# Patient Record
Sex: Female | Born: 1950
Health system: Southern US, Community
[De-identification: ages and names within clinical notes are randomized; demographics above are authoritative.]

## PROBLEM LIST (undated history)

## (undated) DIAGNOSIS — F419 Anxiety disorder, unspecified: Secondary | ICD-10-CM

## (undated) DIAGNOSIS — F32A Depression, unspecified: Secondary | ICD-10-CM

## (undated) DIAGNOSIS — K449 Diaphragmatic hernia without obstruction or gangrene: Secondary | ICD-10-CM

## (undated) DIAGNOSIS — K219 Gastro-esophageal reflux disease without esophagitis: Secondary | ICD-10-CM

## (undated) DIAGNOSIS — T7840XA Allergy, unspecified, initial encounter: Secondary | ICD-10-CM

## (undated) DIAGNOSIS — M1990 Unspecified osteoarthritis, unspecified site: Secondary | ICD-10-CM

## (undated) DIAGNOSIS — F329 Major depressive disorder, single episode, unspecified: Secondary | ICD-10-CM

## (undated) HISTORY — DX: Unspecified osteoarthritis, unspecified site: M19.90

## (undated) HISTORY — DX: Allergy, unspecified, initial encounter: T78.40XA

## (undated) HISTORY — PX: WRIST SURGERY: SHX841

## (undated) HISTORY — DX: Major depressive disorder, single episode, unspecified: F32.9

## (undated) HISTORY — DX: Depression, unspecified: F32.A

## (undated) HISTORY — PX: TONSILLECTOMY: SUR1361

## (undated) HISTORY — DX: Diaphragmatic hernia without obstruction or gangrene: K44.9

---

## 2010-11-18 ENCOUNTER — Ambulatory Visit: Payer: Self-pay | Admitting: Gastroenterology

## 2011-06-28 ENCOUNTER — Emergency Department: Payer: Self-pay | Admitting: *Deleted

## 2011-06-28 LAB — URINALYSIS, COMPLETE
Bacteria: NONE SEEN
Bilirubin,UR: NEGATIVE
Blood: NEGATIVE
Glucose,UR: NEGATIVE mg/dL (ref 0–75)
Leukocyte Esterase: NEGATIVE
Nitrite: NEGATIVE
Ph: 7 (ref 4.5–8.0)
Protein: NEGATIVE
Specific Gravity: 1.005 (ref 1.003–1.030)
Squamous Epithelial: 1
WBC UR: <1 /HPF (ref 0–5)

## 2011-06-28 LAB — CBC WITH DIFFERENTIAL/PLATELET
Eosinophil #: 0 10*3/uL (ref 0.0–0.7)
HCT: 39.9 % (ref 35.0–47.0)
MCH: 29.5 pg (ref 26.0–34.0)
Monocyte #: 0.7 x10 3/mm (ref 0.2–0.9)
Neutrophil #: 7.6 10*3/uL — ABNORMAL HIGH (ref 1.4–6.5)
Platelet: 350 10*3/uL (ref 150–440)
RDW: 13.3 % (ref 11.5–14.5)

## 2011-06-28 LAB — BASIC METABOLIC PANEL
Anion Gap: 9 (ref 7–16)
Calcium, Total: 9.6 mg/dL (ref 8.5–10.1)
Co2: 26 mmol/L (ref 21–32)
Creatinine: 1 mg/dL (ref 0.60–1.30)
EGFR (Non-African Amer.): 60 — ABNORMAL LOW
Sodium: 141 mmol/L (ref 136–145)

## 2012-10-11 ENCOUNTER — Emergency Department: Payer: Self-pay | Admitting: Emergency Medicine

## 2012-10-11 LAB — COMPREHENSIVE METABOLIC PANEL
Albumin: 3.9 g/dL (ref 3.4–5.0)
Alkaline Phosphatase: 167 U/L — ABNORMAL HIGH (ref 50–136)
Anion Gap: 9 (ref 7–16)
BUN: 12 mg/dL (ref 7–18)
Bilirubin,Total: 0.8 mg/dL (ref 0.2–1.0)
Calcium, Total: 9.7 mg/dL (ref 8.5–10.1)
Co2: 26 mmol/L (ref 21–32)
EGFR (Non-African Amer.): >60
Glucose: 105 mg/dL — ABNORMAL HIGH (ref 65–99)
Osmolality: 283 (ref 275–301)
Potassium: 3.5 mmol/L (ref 3.5–5.1)
SGOT(AST): 22 U/L (ref 15–37)
SGPT (ALT): 17 U/L (ref 12–78)
Sodium: 142 mmol/L (ref 136–145)
Total Protein: 7.6 g/dL (ref 6.4–8.2)

## 2012-10-11 LAB — CBC
HCT: 41.7 % (ref 35.0–47.0)
MCH: 31.1 pg (ref 26.0–34.0)
MCHC: 35.7 g/dL (ref 32.0–36.0)
Platelet: 249 10*3/uL (ref 150–440)
RBC: 4.78 10*6/uL (ref 3.80–5.20)
RDW: 13.6 % (ref 11.5–14.5)
WBC: 10.3 10*3/uL (ref 3.6–11.0)

## 2014-08-20 ENCOUNTER — Other Ambulatory Visit: Payer: Self-pay | Admitting: Internal Medicine

## 2014-08-20 DIAGNOSIS — Z1231 Encounter for screening mammogram for malignant neoplasm of breast: Secondary | ICD-10-CM

## 2014-08-27 ENCOUNTER — Ambulatory Visit: Payer: Self-pay | Attending: Internal Medicine

## 2014-08-30 ENCOUNTER — Encounter: Payer: Self-pay | Admitting: Emergency Medicine

## 2014-08-30 ENCOUNTER — Emergency Department: Payer: Medicare HMO

## 2014-08-30 ENCOUNTER — Other Ambulatory Visit: Payer: Self-pay

## 2014-08-30 ENCOUNTER — Emergency Department
Admission: EM | Admit: 2014-08-30 | Discharge: 2014-08-30 | Disposition: A | Discharge status: Home / Self Care | Payer: Medicare HMO | Attending: Emergency Medicine | Admitting: Emergency Medicine

## 2014-08-30 DIAGNOSIS — K222 Esophageal obstruction: Secondary | ICD-10-CM

## 2014-08-30 DIAGNOSIS — T18128A Food in esophagus causing other injury, initial encounter: Secondary | ICD-10-CM | POA: Diagnosis present

## 2014-08-30 DIAGNOSIS — Y9389 Activity, other specified: Secondary | ICD-10-CM | POA: Diagnosis not present

## 2014-08-30 DIAGNOSIS — Y9289 Other specified places as the place of occurrence of the external cause: Secondary | ICD-10-CM | POA: Diagnosis not present

## 2014-08-30 DIAGNOSIS — X58XXXA Exposure to other specified factors, initial encounter: Secondary | ICD-10-CM | POA: Insufficient documentation

## 2014-08-30 DIAGNOSIS — Y998 Other external cause status: Secondary | ICD-10-CM | POA: Diagnosis not present

## 2014-08-30 DIAGNOSIS — W44F3XA Food entering into or through a natural orifice, initial encounter: Secondary | ICD-10-CM

## 2014-08-30 HISTORY — DX: Anxiety disorder, unspecified: F41.9

## 2014-08-30 HISTORY — DX: Gastro-esophageal reflux disease without esophagitis: K21.9

## 2014-08-30 NOTE — ED Provider Notes (Signed)
[facility] Emergency Department Provider Note  ____________________________________________  Time seen: 6:05 PM  I have reviewed the triage vital signs and the nursing notes.   HISTORY  Chief Complaint Airway Obstruction    HPI Katrina Sutton is a 64 y.o. female who reports approximately 2 hours ago she was eating food and it felt like it got stuck in her esophagus near the epigastrium. She does have a history of esophageal stricture requiring dilation as well as hiatal hernia. He is been told by Dr. Shelle Iron that she needs to be dilated again, but has not done so yet. No fever or chills, no recent procedure or instrumentation. No recent surgery. No abdominal pain or distention. Normal oral intake without nausea vomiting or diarrhea or blood in stool or melena.Denies chest pain or shortness of breath.  The patient initially tried to drink Sprite but was having epigastric pain. She now reports that since being in the waiting room her symptoms have resolved and it feels like the food is passed. The patient also notes that she's been recommended to take Prilosec daily but has only been taking Zantac.     Past Medical History  Diagnosis Date  . Anxiety   . GERD (gastroesophageal reflux disease)     There are no active problems to display for this patient.   Past Surgical History  Procedure Laterality Date  . Tonsillectomy      No current outpatient prescriptions on file.  Allergies Review of patient's allergies indicates no known allergies.  No family history on file.  Social History History  Substance Use Topics  . Smoking status: Never Smoker   . Smokeless tobacco: Not on file  . Alcohol Use: No    Review of Systems  Constitutional: No fever or chills. No weight changes Eyes:No blurry vision or double vision.  ENT: No sore throat. Cardiovascular: No chest pain. Respiratory: No dyspnea or cough. Gastrointestinal: Epigastric pain as above, no  vomiting or diarrhea..  No BRBPR or melena. Genitourinary: Negative for dysuria, urinary retention, bloody urine, or difficulty urinating. Musculoskeletal: Negative for back pain. No joint swelling or pain. Skin: Negative for rash. Neurological: Negative for headaches, focal weakness or numbness. Psychiatric:No anxiety or depression.   Endocrine:No hot/cold intolerance, changes in energy, or sleep difficulty.  10-point ROS otherwise negative.  ____________________________________________   PHYSICAL EXAM:  VITAL SIGNS: ED Triage Vitals  Enc Vitals Group     BP 08/30/14 1713 142/86 mmHg     Pulse Rate 08/30/14 1713 72     Resp 08/30/14 1713 20     Temp 08/30/14 1713 97.7 F (36.5 C)     Temp Source 08/30/14 1713 Oral     SpO2 08/30/14 1713 95 %     Weight 08/30/14 1713 155 lb (70.308 kg)     Height 08/30/14 1713 5\' 5"  (1.651 m)     Head Cir --      Peak Flow --      Pain Score 08/30/14 1719 5     Pain Loc --      Pain Edu? --      Excl. in GC? --      Constitutional: Alert and oriented. Well appearing and in no distress. Eyes: No scleral icterus. No conjunctival pallor. PERRL. EOMI ENT   Head: Normocephalic and atraumatic.   Nose: No congestion/rhinnorhea. No septal hematoma   Mouth/Throat: MMM, no pharyngeal erythema. No peritonsillar mass. No uvula shift.   Neck: No stridor. No SubQ emphysema.  No meningismus. Hematological/Lymphatic/Immunilogical: No cervical lymphadenopathy. Cardiovascular: RRR. Normal and symmetric distal pulses are present in all extremities. No murmurs, rubs, or gallops. Respiratory: Normal respiratory effort without tachypnea nor retractions. Breath sounds are clear and equal bilaterally. No wheezes/rales/rhonchi. Gastrointestinal: Soft and nontender. No distention. There is no CVA tenderness.  No rebound, rigidity, or guarding. Genitourinary: deferred Musculoskeletal: Nontender with normal range of motion in all extremities. No  joint effusions.  No lower extremity tenderness.  No edema. Neurologic:   Normal speech and language.  CN 2-10 normal. Motor grossly intact. No pronator drift.  Normal gait. No gross focal neurologic deficits are appreciated.  Skin:  Skin is warm, dry and intact. No rash noted.  No petechiae, purpura, or bullae. Psychiatric: Mood and affect are normal. Speech and behavior are normal. Patient exhibits appropriate insight and judgment.  ____________________________________________    LABS (pertinent positives/negatives) (all labs ordered are listed, but only abnormal results are displayed) Labs Reviewed - No data to display ____________________________________________   EKG   Date: 08/30/2014  Rate: D3  Rhythm: normal sinus rhythm  QRS Axis: normal  Intervals: normal  ST/T Wave abnormalities: normal  Conduction Disutrbances: none  Narrative Interpretation: unremarkable  EKG interpreted by me    ____________________________________________    RADIOLOGY  Chest x-ray interpreted by me, radiologist report reviewed X-ray shows normal and her spaces and cardiac mediastinal silhouette. There is a large right-sided hiatal hernia and eventration of the diaphragm. There is no free air or pneumothorax.  ____________________________________________   PROCEDURES  ____________________________________________   INITIAL IMPRESSION / ASSESSMENT AND PLAN / ED COURSE  Pertinent labs & imaging results that were available during my care of the patient were reviewed by me and considered in my medical decision making (see chart for details).  Patient is well-appearing. She presented with complaints of an esophageal food bolus impaction which has now resolved. She is tolerating oral intake and drinking soda without difficulty or symptoms in the ED. I did advise her to take Prilosec as directed due to her hiatal hernia and follow-up with Dr. Shelle Iron regarding her esophageal stricture, as the  symptoms suggest that she needs to be evaluated for possibly having a repeat dilation done. She is medically stable and good condition and will discharge her home.  ____________________________________________   FINAL CLINICAL IMPRESSION(S) / ED DIAGNOSES  Final diagnoses:  Esophageal obstruction due to food impaction      Sharman Cheek, MD 08/30/14 [id number]

## 2014-08-30 NOTE — ED Notes (Signed)
Pt presents with bread stuck in her esophagus. Pt with hx of stricture and has had stretching performed in the past. No  Respiratory distress.

## 2014-08-30 NOTE — Discharge Instructions (Signed)
Esophageal Stricture °The esophagus is the long, narrow tube which carries food and liquid from the mouth to the stomach. Sometimes a part of the esophagus becomes narrow and makes it difficult, painful, or even impossible to swallow. This is called an esophageal stricture.  °CAUSES  °Common causes of blockage or strictures of the esophagus are: °· Exposure of the lower esophagus to the acid from the stomach may cause narrowing. °· Hiatal hernia in which a small part of the stomach bulges up through the diaphragm can cause a narrowing in the bottom of the esophagus. °· Scleroderma is a tissue disorder that affects the esophagus and makes swallowing difficult. °· Achalasia is an absence of nerves in the lower esophagus and to the esophageal sphincter. This absence of nerves may be congenital (present since birth). This can cause irregular spasms which do not allow food and fluid through. °· Strictures may develop from swallowing materials which damage the esophagus. Examples are acids or alkalis such as lye. °· Schatzki's Ring is a narrow ring of non-cancerous tissue which narrows the lower esophagus. The cause of this is unknown. °· Growths can block the esophagus. °SYMPTOMS  °Some of the problems are difficulty swallowing or pain with swallowing. °DIAGNOSIS  °Your caregiver often suspects this problem by taking a medical history. They will also do a physical exam. They may then take X-rays and/or perform an endoscopy. Endoscopy is an exam in which a tube like a small flexible telescope is used to look at your esophagus.  °TREATMENT °· One form of treatment is to dilate the narrow area. This means to stretch it. °· When this is not successful, chest surgery may be required. This is a much more extensive form of treatment with a longer recovery time. °Both of the above treatments make the passage of food and water into the stomach easier. They also make it easier for stomach contents to bubble back into the  esophagus. Special medications may be used following the procedure to help prevent further narrowing. Medications may be used to lower the amount of acid in the stomach juice.  °SEEK IMMEDIATE MEDICAL CARE IF:  °· Your swallowing is becoming more painful, difficult, or you are unable to swallow. °· You vomit up blood. °· You develop black tarry stools. °· You develop chills. °· You have a fever. °· You develop chest or abdominal pain. °· You develop shortness of breath, feel lightheaded, or faint. °Follow up with medical care as your caregiver suggests. °Document Released: 11/09/2005 Document Revised: 05/24/2011 Document Reviewed: 07/11/2013 °ExitCare® Patient Information ©2015 [employer]. This information is not intended to replace advice given to you by your health care provider. Make sure you discuss any questions you have with your health care provider. ° °

## 2015-10-16 ENCOUNTER — Ambulatory Visit: Payer: Medicare HMO | Admitting: Podiatry

## 2015-12-02 ENCOUNTER — Other Ambulatory Visit: Payer: Self-pay | Admitting: Internal Medicine

## 2015-12-02 DIAGNOSIS — K222 Esophageal obstruction: Secondary | ICD-10-CM

## 2015-12-03 ENCOUNTER — Ambulatory Visit: Payer: Medicare HMO

## 2016-02-11 DIAGNOSIS — M1711 Unilateral primary osteoarthritis, right knee: Secondary | ICD-10-CM | POA: Insufficient documentation

## 2016-12-27 ENCOUNTER — Encounter: Payer: Self-pay | Admitting: Nurse Practitioner

## 2016-12-27 ENCOUNTER — Ambulatory Visit (INDEPENDENT_AMBULATORY_CARE_PROVIDER_SITE_OTHER): Payer: Medicare HMO | Admitting: Nurse Practitioner

## 2016-12-27 VITALS — BP 121/73 | HR 70 | Temp 98.2°F | Ht 64.25 in | Wt 163.6 lb

## 2016-12-27 DIAGNOSIS — F329 Major depressive disorder, single episode, unspecified: Secondary | ICD-10-CM | POA: Insufficient documentation

## 2016-12-27 DIAGNOSIS — F419 Anxiety disorder, unspecified: Secondary | ICD-10-CM

## 2016-12-27 DIAGNOSIS — K219 Gastro-esophageal reflux disease without esophagitis: Secondary | ICD-10-CM | POA: Insufficient documentation

## 2016-12-27 DIAGNOSIS — F3289 Other specified depressive episodes: Secondary | ICD-10-CM

## 2016-12-27 DIAGNOSIS — K2100 Gastro-esophageal reflux disease with esophagitis, without bleeding: Secondary | ICD-10-CM

## 2016-12-27 DIAGNOSIS — F418 Other specified anxiety disorders: Secondary | ICD-10-CM | POA: Insufficient documentation

## 2016-12-27 DIAGNOSIS — M1990 Unspecified osteoarthritis, unspecified site: Secondary | ICD-10-CM | POA: Insufficient documentation

## 2016-12-27 DIAGNOSIS — Z7689 Persons encountering health services in other specified circumstances: Secondary | ICD-10-CM | POA: Diagnosis not present

## 2016-12-27 DIAGNOSIS — F411 Generalized anxiety disorder: Secondary | ICD-10-CM | POA: Insufficient documentation

## 2016-12-27 DIAGNOSIS — J302 Other seasonal allergic rhinitis: Secondary | ICD-10-CM | POA: Insufficient documentation

## 2016-12-27 NOTE — Progress Notes (Signed)
Subjective:    Patient ID: Katrina Sutton, female    DOB: 1950-10-11, 66 y.o.   MRN: [id number]  Katrina Sutton is a 66 y.o. female presenting on 12/27/2016 for Establish Care (pt PCP retired)   HPI [facility] Provider Pt last seen by PCP Dr. Brynda Greathouse within the last 6 months.  Is establishing care here because Dr. Brynda Greathouse is retired.  Records received and reviewed prior to appointment.  Chronic Sciatica Sees Dr. Harlow Ohms Chiropractor for back - nerves in lower back for sciatic nerve.  Has adjustments montly.  Has significant improvement w/ alignments.  Previously was having sciatica w/ right leg weakness. - Declines bone scan for osteoporosis in Dr. Ermalene Searing.  Osteoarthritis Has some arthritis of knees, hips, hands, and back.  Managable and takes acetaminophen as needed for pain management.  Anxiety and Depression - Currently takes Paxil 40 mg once daily despite Rx for 60 mg once daily.  Also taking Klonopin 1 mg 3 x per day.  Usually uses 3 per day, but had used 4 per day in past.  Has previously been instructed by Dr. Brynda Greathouse to increase Paxil to 60 mg per day so we can decrease Klonopin use, but pt didn't notice change w/ 60 mg and has reduced to 40 mg per day.  GERD and Esophageal Stricture Takes prilosec 40 mg once daily.  Also has esophagitis.  Has no breakthrough reflux when taking PPI.  Has been on stable dose for several years.   GAD 7 : Generalized Anxiety Score 12/27/2016  Nervous, Anxious, on Edge 1  Control/stop worrying 1  Worry too much - different things 1  Trouble relaxing 1  Restless 1  Easily annoyed or irritable 1  Afraid - awful might happen 0  Total GAD 7 Score 6  Anxiety Difficulty Somewhat difficult   Depression screen [facility] 2/9 12/27/2016 12/27/2016  Decreased Interest 0 0  Down, Depressed, Hopeless 0 0  PHQ - 2 Score 0 0  Altered sleeping 1 -  Tired, decreased energy 1 -  Change in appetite 0 -  Feeling bad or failure about yourself  1 -    Trouble concentrating 0 -  Moving slowly or fidgety/restless 1 -  Suicidal thoughts 0 -  PHQ-9 Score 4 -  Difficult doing work/chores Somewhat difficult -    Past Medical History:  Diagnosis Date  . Allergy    seasonal  . Anxiety   . Arthritis    osteoarthritis  . Depression   . GERD (gastroesophageal reflux disease)    Past Surgical History:  Procedure Laterality Date  . TONSILLECTOMY    . WRIST SURGERY Left    Social History   Social History  . Marital status: Married    Spouse name: N/A  . Number of children: N/A  . Years of education: N/A   Occupational History  . Not on file.   Social History Main Topics  . Smoking status: Former Smoker    Quit date: 12/27/1976  . Smokeless tobacco: Never Used  . Alcohol use No  . Drug use: No  . Sexual activity: No   Other Topics Concern  . Not on file   Social History Narrative  . No narrative on file   Family History  Problem Relation Age of Onset  . Mental illness Mother   . Ulcers Father   . Stroke Neg Hx   . Heart attack Neg Hx   . Cancer Neg Hx  No current outpatient prescriptions on file prior to visit.   No current facility-administered medications on file prior to visit.     Review of Systems  Constitutional: Negative.   HENT: Negative.   Eyes: Negative.   Respiratory: Negative.   Cardiovascular: Negative.   Gastrointestinal: Negative.   Endocrine: Negative.   Genitourinary: Negative.   Musculoskeletal: Positive for arthralgias and back pain.  Skin: Negative.   Allergic/Immunologic: Negative.   Neurological: Negative.   Hematological: Negative.   Psychiatric/Behavioral: Negative for agitation, behavioral problems, confusion, decreased concentration, dysphoric mood, hallucinations, self-injury, sleep disturbance and suicidal ideas. The patient is nervous/anxious. The patient is not hyperactive.    Per HPI unless specifically indicated above     Objective:    BP 121/73 (BP Location:  Left Arm, Patient Position: Sitting, Cuff Size: Normal)   Pulse 70   Temp 98.2 F (36.8 C) (Oral)   Ht 5' 4.25" (1.632 m)   Wt 163 lb 9.6 oz (74.2 kg)   BMI 27.86 kg/m   Wt Readings from Last 3 Encounters:  12/27/16 163 lb 9.6 oz (74.2 kg)  08/30/14 155 lb (70.3 kg)    Physical Exam  General - overweight, well-appearing, NAD HEENT - Normocephalic, atraumatic Neck - supple, non-tender, no LAD, no thyromegaly, no carotid bruit Heart - RRR, no murmurs heard Lungs - Clear throughout all lobes, no wheezing, crackles, or rhonchi. Normal work of breathing. Extremeties - non-tender, no edema, cap refill < 2 seconds, peripheral pulses intact +2 bilaterally Skin - warm, dry Neuro - awake, alert, oriented x3, normal gait Psych - Normal mood and affect, normal behavior    Results for orders placed or performed in visit on 10/11/12  CBC  Result Value Ref Range   WBC 10.3 3.6 - 11.0 x10 3/mm 3   RBC 4.78 3.80 - 5.20 X10 6/mm 3   HGB 14.9 12.0 - 16.0 g/dL   HCT 41.7 35.0 - 47.0 %   MCV 87 80 - 100 fL   MCH 31.1 26.0 - 34.0 pg   MCHC 35.7 32.0 - 36.0 g/dL   RDW 13.6 11.5 - 14.5 %   Platelet 249 150 - 440 x10 3/mm 3  Comprehensive metabolic panel  Result Value Ref Range   Glucose 105 (H) 65 - 99 mg/dL   BUN 12 7 - 18 mg/dL   Creatinine 0.92 0.60 - 1.30 mg/dL   Sodium 142 136 - 145 mmol/L   Potassium 3.5 3.5 - 5.1 mmol/L   Chloride 107 98 - 107 mmol/L   Co2 26 21 - 32 mmol/L   Calcium, Total 9.7 8.5 - 10.1 mg/dL   SGOT(AST) 22 15 - 37 Unit/L   SGPT (ALT) 17 12 - 78 U/L   Alkaline Phosphatase 167 (H) 50 - 136 Unit/L   Albumin 3.9 3.4 - 5.0 g/dL   Total Protein 7.6 6.4 - 8.2 g/dL   Bilirubin,Total 0.8 0.2 - 1.0 mg/dL   Osmolality 283 275 - 301   Anion Gap 9 7 - 16   EGFR (African American) >60    EGFR (Non-African Amer.) >60       Assessment & Plan:   Problem List Items Addressed This Visit      Digestive   GERD (gastroesophageal reflux disease)    Stable and well  controlled on omeprazole 40 mg once daily.  Pt also reports concurrent esophageal stricture and previous esophagitis identified. No active complaints.  Plan: 1. Continue omeprazole 40 mg once daily.   -  Recommend starting once daily multivitamin. 2. Follow up every 6 months.      Relevant Medications   omeprazole (PRILOSEC) 40 MG capsule     Musculoskeletal and Integument   Arthritis    Currently well controlled w/ occasional acetaminophen use.  Plan: 1. Encouraged continuing physical activity as tolerated. 2. Continue OTC acetaminophen prn max 3,000 mg per day. 3. Followup as needed.      Relevant Medications   acetaminophen (TYLENOL) 325 MG tablet     Other   Anxiety    Currently uncontrolled w/ moderate symptoms.  Pt notes regular use of clonazepam and self reduction of paxil.  Plan: 1. Decrease clonazepam use by 1/2 mg per day until only taking one 0.5 mg tablet three times daily as needed for anxiety.  Reinforced as needed frequency. Would like to discontinue clonazepam as is not recommended for older adults.  Work toward improved daily medication regimen. 2. Encouraged maintaining paxil at 60 mg dose to assist w/ reduction of clonazepam. 3. Follow up 3 months and in 4 weeks if needed.      Relevant Medications   PARoxetine (PAXIL) 20 MG tablet   Depression    See AP anxiety. Mild depression symptoms today.  Needs to increase paxil as previously prescribed.      Relevant Medications   PARoxetine (PAXIL) 20 MG tablet    Other Visit Diagnoses    Encounter to establish care    -  Primary   Pt needing new PCP w/ retirement of Dr. Brynda Greathouse.  Chart reviewed.  Reviewed history and medications w/ patient.      Meds ordered this encounter  Medications  . clonazePAM (KLONOPIN) 1 MG tablet    Sig: Take 1 mg by mouth 3 (three) times daily.  Marland Kitchen omeprazole (PRILOSEC) 40 MG capsule  . PARoxetine (PAXIL) 20 MG tablet    Sig: Take 20 mg by mouth daily.  Marland Kitchen acetaminophen  (TYLENOL) 325 MG tablet    Sig: Take 650 mg by mouth every 6 (six) hours as needed.      Follow up plan: Return in about 3 months (around 03/29/2017) for anxiety.  Cassell Smiles, DNP, AGPCNP-BC Adult Gerontology Primary Care Nurse Practitioner [facility] 12/29/2016, 3:34 PM

## 2016-12-27 NOTE — Assessment & Plan Note (Addendum)
Stable and well controlled on omeprazole 40 mg once daily.  Pt also reports concurrent esophageal stricture and previous esophagitis identified. No active complaints.  Plan: 1. Continue omeprazole 40 mg once daily.   - Recommend starting once daily multivitamin. 2. Follow up every 6 months.

## 2016-12-27 NOTE — Patient Instructions (Addendum)
Katrina Sutton, Thank you for coming in to clinic today.  1. For your klonopin: - Take 1/2-1 tablet as needed up to three times daily for anxiety. - FOR 1-2 weeks take 1 tablet twice daily and 1/2 tablet once daily. - THEN, for another 1-2 weeks, take 1 tablet once daily and 1/2 tablet twice daily. - THEN continue at 1/2 tablet up to three times daily as needed for anxiety.  Go ahead and increase your Paxil to 60 mg once daily.     Please schedule a follow-up appointment with Cassell Smiles, AGNP. Return in about 3 months (around 03/29/2017) for anxiety.   If you have any other questions or concerns, please feel free to call the clinic or send a message through [facility]. You may also schedule an earlier appointment if necessary.  You will receive a survey after today's visit either digitally by e-mail or paper by [employer]. Your experiences and feedback matter to [place].  Please respond so we know how we are doing as we provide care for you.   Cassell Smiles, DNP, AGNP-BC Adult Gerontology Nurse Practitioner [facility], [facility]   Stress and Stress Management Stress is a normal reaction to life events. It is what you feel when life demands more than you are used to or more than you can handle. Some stress can be useful. For example, the stress reaction can help you catch the last bus of the day, study for a test, or meet a deadline at work. But stress that occurs too often or for too long can cause problems. It can affect your emotional health and interfere with relationships and normal daily activities. Too much stress can weaken your immune system and increase your risk for physical illness. If you already have a medical problem, stress can make it worse. What are the causes? All sorts of life events may cause stress. An event that causes stress for one person may not be stressful for another person. Major life events commonly cause stress. These may be positive or negative. Examples  include losing your job, moving into a new home, getting married, having a baby, or losing a loved one. Less obvious life events may also cause stress, especially if they occur day after day or in combination. Examples include working long hours, driving in traffic, caring for children, being in debt, or being in a difficult relationship. What are the signs or symptoms? Stress may cause emotional symptoms including, the following:  Anxiety. This is feeling worried, afraid, on edge, overwhelmed, or out of control.  Anger. This is feeling irritated or impatient.  Depression. This is feeling sad, down, helpless, or guilty.  Difficulty focusing, remembering, or making decisions.  Stress may cause physical symptoms, including the following:  Aches and pains. These may affect your head, neck, back, stomach, or other areas of your body.  Tight muscles or clenched jaw.  Low energy or trouble sleeping.  Stress may cause unhealthy behaviors, including the following:  Eating to feel better (overeating) or skipping meals.  Sleeping too little, too much, or both.  Working too much or putting off tasks (procrastination).  Smoking, drinking alcohol, or using drugs to feel better.  How is this diagnosed? Stress is diagnosed through an assessment by your health care provider. Your health care provider will ask questions about your symptoms and any stressful life events.Your health care provider will also ask about your medical history and may order blood tests or other tests. Certain medical conditions and  medicine can cause physical symptoms similar to stress. Mental illness can cause emotional symptoms and unhealthy behaviors similar to stress. Your health care provider may refer you to a mental health professional for further evaluation. How is this treated? Stress management is the recommended treatment for stress.The goals of stress management are reducing stressful life events and coping with  stress in healthy ways. Techniques for reducing stressful life events include the following:  Stress identification. Self-monitor for stress and identify what causes stress for you. These skills may help you to avoid some stressful events.  Time management. Set your priorities, keep a calendar of events, and learn to say "no." These tools can help you avoid making too many commitments.  Techniques for coping with stress include the following:  Rethinking the problem. Try to think realistically about stressful events rather than ignoring them or overreacting. Try to find the positives in a stressful situation rather than focusing on the negatives.  Exercise. Physical exercise can release both physical and emotional tension. The key is to find a form of exercise you enjoy and do it regularly.  Relaxation techniques. These relax the body and mind. Examples include yoga, meditation, tai chi, biofeedback, deep breathing, progressive muscle relaxation, listening to music, being out in nature, journaling, and other hobbies. Again, the key is to find one or more that you enjoy and can do regularly.  Healthy lifestyle. Eat a balanced diet, get plenty of sleep, and do not smoke. Avoid using alcohol or drugs to relax.  Strong support network. Spend time with family, friends, or other people you enjoy being around.Express your feelings and talk things over with someone you trust.  Counseling or talktherapy with a mental health professional may be helpful if you are having difficulty managing stress on your own. Medicine is typically not recommended for the treatment of stress.Talk to your health care provider if you think you need medicine for symptoms of stress. Follow these instructions at home:  Keep all follow-up visits as directed by your health care provider.  Take all medicines as directed by your health care provider. Contact a health care provider if:  Your symptoms get worse or you start  having new symptoms.  You feel overwhelmed by your problems and can no longer manage them on your own. Get help right away if:  You feel like hurting yourself or someone else. This information is not intended to replace advice given to you by your health care provider. Make sure you discuss any questions you have with your health care provider. Document Released: 08/25/2000 Document Revised: 08/07/2015 Document Reviewed: 10/24/2012 Elsevier Interactive Patient Education  2017 [employer].

## 2016-12-29 NOTE — Assessment & Plan Note (Signed)
See AP anxiety. Mild depression symptoms today.  Needs to increase paxil as previously prescribed.

## 2016-12-29 NOTE — Assessment & Plan Note (Signed)
Currently uncontrolled w/ moderate symptoms.  Pt notes regular use of clonazepam and self reduction of paxil.  Plan: 1. Decrease clonazepam use by 1/2 mg per day until only taking one 0.5 mg tablet three times daily as needed for anxiety.  Reinforced as needed frequency. Would like to discontinue clonazepam as is not recommended for older adults.  Work toward improved daily medication regimen. 2. Encouraged maintaining paxil at 60 mg dose to assist w/ reduction of clonazepam. 3. Follow up 3 months and in 4 weeks if needed.

## 2016-12-29 NOTE — Assessment & Plan Note (Signed)
Currently well controlled w/ occasional acetaminophen use.  Plan: 1. Encouraged continuing physical activity as tolerated. 2. Continue OTC acetaminophen prn max 3,000 mg per day. 3. Followup as needed.

## 2017-01-05 ENCOUNTER — Telehealth: Payer: Self-pay

## 2017-01-05 NOTE — Telephone Encounter (Signed)
After talking to Dr. Lamont DowdyKaramaleogs I advised the patient to take 1 tab in AM, 1/2 lunch, 1/2 afternoon and 1 tab at night.  Patient was instructed to call back on Monday and let us know how she is doing.

## 2017-01-05 NOTE — Telephone Encounter (Signed)
Covering inbox for Katrina Sutton, AGPCNP-BC while she is out of office.  Reviewed chart from office visit 10/15 with Lauren.  Initial plan for Clonazepam taper at that time - Take 1/2-1 tablet as needed up to three times daily for anxiety. - FOR 1-2 weeks take 1 tablet twice daily and 1/2 tablet once daily. - THEN, for another 1-2 weeks, take 1 tablet once daily and 1/2 tablet twice daily. - THEN continue at 1/2 tablet up to three times daily as needed for anxiety.  She was increased on Paxil as well to compensate for change in Clonazepam.  Patient has existing rx, did not receive new rx on 10/15.  It sounds like her symptoms may be related to some BDZ withdrawal with increased anxiety as reported and feeling "nervous", possibly the dry mouth and stomach cramps are related as well.  I would prefer to be cautious with this taper, and if she needs to adjust her dose for now that is fine.  Recommend for her to increase night time dose back to 1mg . She may take 1mg  in AM, 1/2 lunch, 1/2 afternoon and 1 at PM for next 1-2 weeks, and then gradually continue to decrease only as tolerated.  This process may take >1 month to wean her off. However, I do understand concerns about refilling this rx for a patient if not indicated or safety concerns.  Will forward message to PCP Katrina Sutton, AGPCNP-BC. If not improved by next week, she should contact office again for further taper dose adjustment.  Saralyn PilarAlexander Erandy Mceachern, DO [facility] 01/05/2017, 3:15 PM

## 2017-01-05 NOTE — Telephone Encounter (Signed)
Spoke with patient is she has been on clonazepam TID for 10 years.  She is currently taking 1 tab in AM, 1/2 lunch, 1/2 afternoon and 1/2 night.

## 2017-01-05 NOTE — Telephone Encounter (Signed)
Patient called requesting a call back.  Katrina Sutton decreased her clonazepam and patient is having dry mouth and stomach cramps.  She would like a call back on if she can change how she is decreasing.

## 2017-01-10 ENCOUNTER — Telehealth: Payer: Self-pay

## 2017-01-10 NOTE — Telephone Encounter (Signed)
Patient called back today reporting she is doing better on the increased does of clonazepam.  Please see last message on dose changes. She would like a call back on new instructions on deceasing if needed.

## 2017-01-10 NOTE — Telephone Encounter (Signed)
This my recommendation for her decrease.  Please print for patient and mail.  Provide these verbal and written instructions: - Continue current doses until November 1.  Then, decrease by 1/2 tablet every 1-2 weeks starting with your morning and afternoon doses.  Keep your bedtime dose highest. - If at any time symptoms worsen, go back to dosing from the previous week for an additional 5 days.  Then resume your schedule to decrease.  On November 1st: AM: 1/2 tablet (0.5 mg) Lunch: 1/2 tablet (0.5 mg) Afternoon/Dinner: 1/2 tablet (0.5 mg) Bedtime: 1 tablet (1 mg)  On November 10th: AM: 1/2 tablet (0.5 mg) Lunch: 1/2 tablet (0.5 mg) Afternoon/Dinner: STOP this dose Bedtime: 1 tablet (1 mg)  On November 17th: AM: 1/2 tablet (0.5 mg) Lunch: 1/2 tablet (0.5 mg) Afternoon/Dinner: STOP this dose Bedtime: 1/2 tablet (0.5 mg)  On November 24th: AM: 1/2 tablet (0.5 mg) Lunch: 1/2 tablet (0.5 mg) Bedtime: 1/2 tablet (0.5 mg)  On December 1st: AM: 1/2 tablet (0.5 mg) Lunch: STOP taking this dose Bedtime: 1 tablet (0.5 mg)  On December 7th: AM: STOP taking this dose Bedtime: 1/2 tablet (0.5 mg)  On December 14th: Bedtime: STOP taking this dose.

## 2017-01-13 NOTE — Telephone Encounter (Signed)
Attempted to contact the pt several times, no answer. Messages left on vm. I also mailed out a letter to notify the pt of medication changes.

## 2017-01-31 ENCOUNTER — Telehealth: Payer: Self-pay

## 2017-01-31 NOTE — Telephone Encounter (Signed)
The pt called stating she is still having a hard time weaning off of her Klonopin. She said she started developing symptoms on the 2nd week, but by the 3rd week things got real bad. Week 2 she started noticing dry mouth, lip numbness, shaking, and heart palpation, but week 3 she started becoming manic. The pt states she went back to  the directions from week #2, because she was scared she was going to end up in the hospital.

## 2017-01-31 NOTE — Telephone Encounter (Signed)
Pt is taking current dose of clonazepam: AM: 1/2 tablet (0.5 mg) Lunch: 1/2 tablet (0.5 mg) Afternoon/Dinner: STOP this dose Bedtime: 1 tablet (1 mg)  Pt instructed to continue at this dose for as long as needed or until next appointment.  May consider moving to the following dose schedule if tolerated: AM: 1/2 tablet (0.5 mg) Lunch: 1/2 tablet (0.5 mg) Afternoon/Dinner: STOP this dose Bedtime: 1 tablet (1 mg)  Pt verbalizes understanding.

## 2017-02-28 ENCOUNTER — Telehealth: Payer: Self-pay

## 2017-02-28 NOTE — Telephone Encounter (Signed)
The pt called complaining of intermittent rapid heart beat times 2-3 weeks. She seems to think it's stress related, because she's been really stressed out recently. She also admits when her stress level goes up she have notice a pain in the right side of her neck. The pt states her Clonazepam dosage was changed (2) weeks ago and unsure if the heart palpations is related to the dose change. She would like to come in the office to discuss her concerns.

## 2017-03-22 DIAGNOSIS — M955 Acquired deformity of pelvis: Secondary | ICD-10-CM | POA: Diagnosis not present

## 2017-03-22 DIAGNOSIS — M9905 Segmental and somatic dysfunction of pelvic region: Secondary | ICD-10-CM | POA: Diagnosis not present

## 2017-03-22 DIAGNOSIS — M5416 Radiculopathy, lumbar region: Secondary | ICD-10-CM | POA: Diagnosis not present

## 2017-03-22 DIAGNOSIS — M9903 Segmental and somatic dysfunction of lumbar region: Secondary | ICD-10-CM | POA: Diagnosis not present

## 2017-03-25 ENCOUNTER — Encounter: Payer: Self-pay | Admitting: Nurse Practitioner

## 2017-03-25 ENCOUNTER — Other Ambulatory Visit: Payer: Self-pay

## 2017-03-25 ENCOUNTER — Ambulatory Visit (INDEPENDENT_AMBULATORY_CARE_PROVIDER_SITE_OTHER): Payer: Medicare HMO | Admitting: Nurse Practitioner

## 2017-03-25 VITALS — BP 132/77 | HR 72 | Temp 97.8°F | Ht 64.25 in | Wt 165.8 lb

## 2017-03-25 DIAGNOSIS — R000 Tachycardia, unspecified: Secondary | ICD-10-CM

## 2017-03-25 DIAGNOSIS — F419 Anxiety disorder, unspecified: Secondary | ICD-10-CM

## 2017-03-25 DIAGNOSIS — R0602 Shortness of breath: Secondary | ICD-10-CM | POA: Diagnosis not present

## 2017-03-25 DIAGNOSIS — R002 Palpitations: Secondary | ICD-10-CM

## 2017-03-25 DIAGNOSIS — Z1231 Encounter for screening mammogram for malignant neoplasm of breast: Secondary | ICD-10-CM | POA: Diagnosis not present

## 2017-03-25 DIAGNOSIS — F3289 Other specified depressive episodes: Secondary | ICD-10-CM

## 2017-03-25 DIAGNOSIS — Z1239 Encounter for other screening for malignant neoplasm of breast: Secondary | ICD-10-CM

## 2017-03-25 MED ORDER — CLONAZEPAM 0.5 MG PO TABS: ORAL_TABLET | ORAL | 0 refills | Status: DC

## 2017-03-25 MED ORDER — DULOXETINE HCL 60 MG PO CPEP: 60.0000 mg | ORAL_CAPSULE | Freq: Every day | ORAL | 3 refills | Status: DC

## 2017-03-25 MED ORDER — PAROXETINE HCL 40 MG PO TABS: ORAL_TABLET | ORAL | Status: DC

## 2017-03-25 NOTE — Patient Instructions (Addendum)
Katrina Sutton, Thank you for coming in to clinic today.  1. Continue clonazepam at your current dose of 0.5 mg in am, 0.5 mg at noon, 0.5 mg in afternoon, and 1mg  at night.  I am changing you to a 0.5 mg tablet, so take 2 tablets at night.  2.  Reduce your paxil to 40 mg once daily for 4 days and 1/2 tablet(20 mg) once daily for 4 days.  Then, STOP.  3. START duloxetine 60 mg once daily now while you are reducing your paxil.  Please schedule a follow-up appointment with Wilhelmina McardleLauren Neyda Durango, AGNP. Return in about 4 weeks (around 04/22/2017) for anxiety and depression.  If you have any other questions or concerns, please feel free to call the clinic or send a message through MyChart. You may also schedule an earlier appointment if necessary.  You will receive a survey after today's visit either digitally by e-mail or paper by [employer] mail. Your experiences and feedback matter to us.  Please respond so we know how we are doing as we provide care for you.   Wilhelmina McardleLauren Braven Wolk, DNP, AGNP-BC Adult Gerontology Nurse Practitioner [facility], [facility]

## 2017-03-25 NOTE — Progress Notes (Signed)
Subjective:    Patient ID: Katrina Sutton, female    DOB: 05/16/1950, 67 y.o.   MRN: [id number]  Katrina Sutton is a 67 y.o. female presenting on 03/25/2017 for Anxiety (heart palpations since the change in medication)   HPI Shortness of breath Is having some heart palpitations and throbbing pulse in ears at night.  Shortness of breath with exertion even when walking from car to clinic today which was short distance of about 100 ft.  Anxiety - Pt notes she has been under a lot of stress - husband is a Chartered loss adjusterhoarder.  Has financial stress and mostly due to responsibilities of life and caring for "manic son."  She feels she cannot remove herself from the situation and feels positive responsibility for the role of caregiver. - Uses deep breathing, but does not help when she is having racing heartrate. - Has been able to reduce clonazepam from   Social History   Tobacco Use  . Smoking status: Former Smoker    Last attempt to quit: 12/27/1976    Years since quitting: 40.3  . Smokeless tobacco: Never Used  Substance Use Topics  . Alcohol use: No  . Drug use: No    Review of Systems Per HPI unless specifically indicated above     Objective:    BP 132/77 (BP Location: Right Arm, Patient Position: Sitting, Cuff Size: Normal)   Pulse 72   Temp 97.8 F (36.6 C) (Oral)   Ht 5' 4.25" (1.632 m)   Wt 165 lb 12.8 oz (75.2 kg)   BMI 28.24 kg/m   Wt Readings from Last 3 Encounters:  03/25/17 165 lb 12.8 oz (75.2 kg)  12/27/16 163 lb 9.6 oz (74.2 kg)  08/30/14 155 lb (70.3 kg)    Physical Exam  General - overweight, well-appearing, NAD HEENT - Normocephalic, atraumatic Neck - supple, non-tender, no LAD, no thyromegaly, no carotid bruit Heart - RRR, no murmurs heard Lungs - Clear throughout all lobes, no wheezing, crackles, or rhonchi. Normal work of breathing. Extremeties - non-tender, no edema, cap refill < 2 seconds, peripheral pulses intact +2 bilaterally Skin - warm, dry Neuro - awake,  alert, oriented x3, normal gait Psych - Normal mood and anxious affect, normal behavior    6 minute walk test today:  - HR 105 with onset of dizziness, ShOB at about 5 minutes duration.  SpO2 = 90-91% - After sitting to rest for 2-3 mintues, SpO2 = 94%, HR 86 and quick resolution of symptoms.   Assessment & Plan:   Problem List Items Addressed This Visit      Other   Anxiety - Primary    Currently uncontrolled w/ moderate symptoms.  Pt notes some improvement overall with reduction of clonazepam, but is having trouble with stress.  Suspect inadequate long-term medication.  Plan: 1. Continue current dose of clonazepam. 0.5 mg in am, 0.5 mg at noon, 0.5 mg in afternoon, and 1mg  at night. Will continue taper at later date.  Interval withdrawal symptoms led to reduced, but more frequent dosing. 2. Reduce paxil to 40 mg x 1 week, 20 mg x 1 weeek, then stop. 3. START duloxetine 60 mg once daily.  For SNRI properties as well as possible assistance for her chronic pain. 4. Follow up 4 weeks.      Relevant Medications   DULoxetine (CYMBALTA) 60 MG capsule   PARoxetine (PAXIL) 40 MG tablet   clonazePAM (KLONOPIN) 0.5 MG tablet   Depression    See  AP anxiety.      Relevant Medications   DULoxetine (CYMBALTA) 60 MG capsule   PARoxetine (PAXIL) 40 MG tablet    Other Visit Diagnoses    Breast cancer screening     Pt last mammogram > 2 years ago.  Plan: 1. Screening mammogram order placed.  Pt will call to schedule appointment.  Information given.    Relevant Orders   MM DIGITAL SCREENING BILATERAL   Palpitations     Shortness of breath   Exercise-induced tachycardia     Pt with reports of shortness of breath on exertion, tachycardia.  6 minute walk test shows same result with desaturation to 90% and HR 105 before experiencing dizziness.  Plan: 1. Exercise tolerance test with EKG at time of symptoms. 2. May need to consider echocardiogram as well. - Will hold clonazepam taper for  now to prevent tachycardia related to dose reduction. 3. Followup as needed and in 4 weeks.   Relevant Orders   Exercise Tolerance Test      Meds ordered this encounter  Medications  . DULoxetine (CYMBALTA) 60 MG capsule    Sig: Take 1 capsule (60 mg total) by mouth daily.    Dispense:  30 capsule    Refill:  3    Order Specific Question:   Supervising Provider    Answer:   Smitty Cords [[id number]]  . PARoxetine (PAXIL) 40 MG tablet    Sig: Take 1 tablet once daily for 4 days. Then, take 1/2 tablet once daily for 4 days and stop.    Order Specific Question:   Supervising Provider    Answer:   Smitty Cords [[id number]]  . clonazePAM (KLONOPIN) 0.5 MG tablet    Sig: Take 1 tablet in am, noon, afternoon and 2 tablets at bedtime.    Dispense:  150 tablet    Refill:  0    Order Specific Question:   Supervising Provider    Answer:   Smitty Cords [[id number]]     Follow up plan: Return in about 4 weeks (around 04/22/2017) for anxiety and depression.  Wilhelmina Mcardle, DNP, AGPCNP-BC Adult Gerontology Primary Care Nurse Practitioner [facility] 04/14/2017, 6:10 AM

## 2017-04-08 ENCOUNTER — Telehealth: Payer: Self-pay

## 2017-04-08 NOTE — Telephone Encounter (Signed)
Pt called complaining of intermittent lower abdominal, legs, and hands cramps that started about 3-4 days ago. She reports that she is drinking plenty of water, but want to rule out this cramping is not a side effect of her Cymbalta. Please advise

## 2017-04-08 NOTE — Telephone Encounter (Signed)
Abdominal pain can occur in about 5% of adults who take this medication.  As far as leg and hand cramps, this is most likely not related to this medication.  It has been reported, but by less than 1% of patients who take this med.  Phone call to Cohen: - Pt also notes increased mood, constant talking now that she has stopped her paxil.  Last 20 mg dose was 4 days ago.   - Instructed pt to continue duloxetine 60 mg once daily. - Resume paroxetine 20 mg (1/2 tab 40 mg tablet) once daily until next visit in 2 weeks.  - If symptoms persist or worsen, should call clinic again.  Pt verbalizes understanding.

## 2017-04-14 ENCOUNTER — Encounter: Payer: Self-pay | Admitting: Nurse Practitioner

## 2017-04-14 NOTE — Assessment & Plan Note (Signed)
See AP anxiety. 

## 2017-04-14 NOTE — Assessment & Plan Note (Addendum)
Currently uncontrolled w/ moderate symptoms.  Pt notes some improvement overall with reduction of clonazepam, but is having trouble with stress.  Suspect inadequate long-term medication.  Plan: 1. Continue current dose of clonazepam. 0.5 mg in am, 0.5 mg at noon, 0.5 mg in afternoon, and 1mg  at night. Will continue taper at later date.  Interval withdrawal symptoms led to reduced, but more frequent dosing. 2. Reduce paxil to 40 mg x 1 week, 20 mg x 1 weeek, then stop. 3. START duloxetine 60 mg once daily.  For SNRI properties as well as possible assistance for her chronic pain. 4. Follow up 4 weeks.

## 2017-04-19 DIAGNOSIS — M9905 Segmental and somatic dysfunction of pelvic region: Secondary | ICD-10-CM | POA: Diagnosis not present

## 2017-04-19 DIAGNOSIS — M9903 Segmental and somatic dysfunction of lumbar region: Secondary | ICD-10-CM | POA: Diagnosis not present

## 2017-04-19 DIAGNOSIS — M5416 Radiculopathy, lumbar region: Secondary | ICD-10-CM | POA: Diagnosis not present

## 2017-04-19 DIAGNOSIS — M955 Acquired deformity of pelvis: Secondary | ICD-10-CM | POA: Diagnosis not present

## 2017-04-22 ENCOUNTER — Encounter: Payer: Self-pay | Admitting: Nurse Practitioner

## 2017-04-22 ENCOUNTER — Ambulatory Visit (INDEPENDENT_AMBULATORY_CARE_PROVIDER_SITE_OTHER): Payer: Medicare HMO | Admitting: Nurse Practitioner

## 2017-04-22 ENCOUNTER — Other Ambulatory Visit: Payer: Self-pay

## 2017-04-22 DIAGNOSIS — F419 Anxiety disorder, unspecified: Secondary | ICD-10-CM

## 2017-04-22 DIAGNOSIS — F3289 Other specified depressive episodes: Secondary | ICD-10-CM

## 2017-04-22 MED ORDER — PAROXETINE HCL 40 MG PO TABS: 40.0000 mg | ORAL_TABLET | Freq: Every day | ORAL | 5 refills | Status: DC

## 2017-04-22 MED ORDER — DULOXETINE HCL 30 MG PO CPEP: 30.0000 mg | ORAL_CAPSULE | Freq: Every day | ORAL | 3 refills | Status: DC

## 2017-04-22 MED ORDER — CLONAZEPAM 1 MG PO TABS: ORAL_TABLET | ORAL | 0 refills | Status: DC

## 2017-04-22 NOTE — Progress Notes (Signed)
Subjective:    Patient ID: Katrina Sutton, female    DOB: 11/11/1950, 67 y.o.   MRN: [id number]  Katrina Sutton is a 67 y.o. female presenting on 04/22/2017 for Depression and Panic Attack   HPI Depression and Anxiety/panic attacks Feels she has been doing really well since last visit.  Reports not sleeping well at night, but is "a night owl" and only sleeps when she gets really tired.  She notes she is managing her stresses fairly well currently.  Stress at home has not changed, but she is managing well. Interval to last visit via phone is pt did not tolerate duloxetine or fluoxetine, so both have been stopped.  Pt has gone back on paxil and noted some improvement.  Is taking paxil 40 mg. - Pt was last prescribed clonazepam 0.5 mg tablets: pt states she is taking 2 am, 2 afternoon, 2 bedtime, which is more than prescribed at last visit.  Pt has 12 tablets remaining from fill on 03/25/17 prescribed by me.     Depression screen [facility] 2/9 04/22/2017 12/27/2016 12/27/2016  Decreased Interest 0 0 0  Down, Depressed, Hopeless 0 0 0  PHQ - 2 Score 0 0 0  Altered sleeping 0 1 -  Tired, decreased energy 2 1 -  Change in appetite 0 0 -  Feeling bad or failure about yourself  0 1 -  Trouble concentrating 0 0 -  Moving slowly or fidgety/restless 1 1 -  Suicidal thoughts 0 0 -  PHQ-9 Score 3 4 -  Difficult doing work/chores Not difficult at all Somewhat difficult -   GAD 7 : Generalized Anxiety Score 04/22/2017 12/27/2016  Nervous, Anxious, on Edge 1 1  Control/stop worrying 1 1  Worry too much - different things 0 1  Trouble relaxing 0 1  Restless 0 1  Easily annoyed or irritable 2 1  Afraid - awful might happen 0 0  Total GAD 7 Score 4 6  Anxiety Difficulty Not difficult at all Somewhat difficult    Social History   Tobacco Use  . Smoking status: Former Smoker    Last attempt to quit: 12/27/1976    Years since quitting: 40.3  . Smokeless tobacco: Never Used  Substance Use Topics  . Alcohol  use: No  . Drug use: No    Review of Systems Per HPI unless specifically indicated above     Objective:    BP (!) 143/93 (BP Location: Right Arm, Patient Position: Sitting, Cuff Size: Normal)   Pulse (!) 105   Temp (!) 97.4 F (36.3 C) (Oral)   Ht 5' 4.25" (1.632 m)   Wt 165 lb 6.4 oz (75 kg)   BMI 28.17 kg/m   Wt Readings from Last 3 Encounters:  04/22/17 165 lb 6.4 oz (75 kg)  03/25/17 165 lb 12.8 oz (75.2 kg)  12/27/16 163 lb 9.6 oz (74.2 kg)    Physical Exam  General - overweight, well-appearing, NAD, pt wearing brightly patterned top (paisley) with non-matching textured Black/white pants, sneakers.  Hair is unkempt. HEENT - Normocephalic, atraumatic Heart - RRR, no murmurs heard Lungs - Clear throughout all lobes, no wheezing, crackles, or rhonchi. Normal work of breathing. Extremeties - non-tender, no edema, cap refill < 2 seconds, peripheral pulses intact +2 bilaterally Skin - warm, dry Neuro - awake, alert, oriented x3, normal gait Psych - Anxious mood and affect, normal behavior.  Conversation is fragmented and tangential, but exhibits no flight of ideas.  Assessment & Plan:   Problem List Items Addressed This Visit      Other   Anxiety   Relevant Medications   PARoxetine (PAXIL) 40 MG tablet   clonazePAM (KLONOPIN) 1 MG tablet (Start on 04/24/2017)   Depression   Relevant Medications   DULoxetine (CYMBALTA) 30 MG capsule   PARoxetine (PAXIL) 40 MG tablet    Improved control, but pt appears to have hypomania with clinical assessment and presentation today.  Pt reports she is happy with current medications and does not want to change her regimen.  Plan: 1. Continue clonazepam taper.  Take 1 mg in am, 0.5 mg in afternoon, 1 mg at bedtime.   - Discussed it will continue to be tapered down at each visit. 2. Continue paroxetine 40 mg once daily. 3. Encouraged ongoing work with sleep hygiene and stress management. 4. Followup 4 weeks.  Clonazepam 0.5 mg  tablets: pt states she is taking 2 am, 2 afternoon, 2 bedtime, which is more than prescribed at last visit.  Pt has 12 tablets remaining from fill on 03/25/17 prescribed by me.  If pt taking dose as described, she has obtained medication from another source or had tablets remaining from prior prescriber.   PMP aware database checked: Clonazepam 1 mg tabs #90 was filled on 04/11/2017 per Dr. Maryellen Pile.  [facility] and cancelled today's prescription as well as remaining fills from Dr. Jake Church prescription.  Next fill of medication will not occur until 05/24/17 per quantity of already filled medication available to patient and current prescription decision to reduce to 1mg  am, 0.5mg  afternoon, 1 mg hs.    Meds ordered this encounter  Medications  . DULoxetine (CYMBALTA) 30 MG capsule    Sig: Take 1 capsule (30 mg total) by mouth daily.    Dispense:  30 capsule    Refill:  3    Order Specific Question:   Supervising Provider    Answer:   Smitty Cords [[id number]]  . PARoxetine (PAXIL) 40 MG tablet    Sig: Take 1 tablet (40 mg total) by mouth daily.    Dispense:  30 tablet    Refill:  5    Order Specific Question:   Supervising Provider    Answer:   Smitty Cords [[id number]]  . clonazePAM (KLONOPIN) 1 MG tablet    Sig: Take 1 tablet in am, 1/2 tablet in afternoon and 1 tablet at bedtime    Dispense:  75 tablet    Refill:  0    Order Specific Question:   Supervising Provider    Answer:   Smitty Cords [[id number]]      Follow up plan: Return in about 4 weeks (around 05/20/2017) for depression and anxiety.  A total of 30 minutes was spent face-to-face with this patient. Greater than 50% of this time was spent in counseling and coordination of care with the patient.    Wilhelmina Mcardle, DNP, AGPCNP-BC Adult Gerontology Primary Care Nurse Practitioner [facility] 04/22/2017, 2:14 PM

## 2017-04-22 NOTE — Patient Instructions (Addendum)
Katrina Sutton, Thank you for coming in to clinic today.   Please schedule a follow-up appointment with Wilhelmina McardleLauren Jaliel Deavers, AGNP. Return in about 4 weeks (around 05/20/2017) for depression and anxiety.  If you have any other questions or concerns, please feel free to call the clinic or send a message through MyChart. You may also schedule an earlier appointment if necessary.  You will receive a survey after today's visit either digitally by e-mail or paper by [employer] mail. Your experiences and feedback matter to us.  Please respond so we know how we are doing as we provide care for you.   Wilhelmina McardleLauren Megumi Treaster, DNP, AGNP-BC Adult Gerontology Nurse Practitioner [facility], [facility]

## 2017-04-26 ENCOUNTER — Other Ambulatory Visit: Payer: Self-pay | Admitting: Nurse Practitioner

## 2017-04-26 ENCOUNTER — Telehealth: Payer: Self-pay | Admitting: Nurse Practitioner

## 2017-04-26 DIAGNOSIS — F419 Anxiety disorder, unspecified: Secondary | ICD-10-CM

## 2017-04-26 MED ORDER — VENLAFAXINE HCL ER 37.5 MG PO CP24: 37.5000 mg | ORAL_CAPSULE | Freq: Every day | ORAL | 2 refills | Status: DC

## 2017-04-26 NOTE — Telephone Encounter (Signed)
Left message for patient to call back  

## 2017-04-26 NOTE — Progress Notes (Signed)
Fax received from after hours call center that pt had heart racing, palpitations, lightheadedness, headache and mild chest pain after 2 days of taking her duloxetine (low dose).  Same ysmptoms had also occurred at initial higher dose.    Pt called.  Instructed to stop taking medication completely.   - Will start Effexor-XR 37.5 mg once daily.  Can increase as tolerated for controlling symptoms of anxiety and depression.

## 2017-04-26 NOTE — Telephone Encounter (Signed)
Pt has been having headaches and rapid heart beat since she reduced her cymbapta [phone]

## 2017-04-28 NOTE — Telephone Encounter (Signed)
Attempted to contact, no answer. Left a detial message on pt vm.

## 2017-04-28 NOTE — Telephone Encounter (Signed)
Yes.  She is to take both medications.

## 2017-04-28 NOTE — Telephone Encounter (Signed)
The pt want some clarification on if she needs to continue taking the Effexor w/ Paxil.

## 2017-05-02 ENCOUNTER — Telehealth: Payer: Self-pay | Admitting: Nurse Practitioner

## 2017-05-02 DIAGNOSIS — F319 Bipolar disorder, unspecified: Secondary | ICD-10-CM

## 2017-05-02 MED ORDER — QUETIAPINE FUMARATE 100 MG PO TABS: 100.0000 mg | ORAL_TABLET | Freq: Every day | ORAL | 2 refills | Status: DC

## 2017-05-02 NOTE — Telephone Encounter (Signed)
Pt is having side effects from effexor [phone]

## 2017-05-02 NOTE — Telephone Encounter (Signed)
Pt reports worsening reflux, headache, confused.  We are going to stop paxil for now.  Should not be completely excluded for future use, however.    Continue Paxil 40 mg once daily.  START seroquel 100 mg once daily at bedtime.  For first 4 days, should take 1/2 tablet before taking full tablet.  This should help with sleep and her anxiety/depression/mania symptoms.  Pt should consider treatment by a psychiatrist in future since it has been difficult to manage her medications.  Reminded pt that paxil 60 mg once daily as she had been taking by Dr. Maryellen PileEason was not fully controlling her symptoms and was the initial reason for changing medications.  Pt agrees psychiatry referral may be best when discussing on the phone.  Pt verbalizes understanding to all instructions.  Will try clonazepam 1 mg in am, 0.5 mg in afternoon, and again will try 0.5 mg at bedtime with addition of seroquel.

## 2017-05-13 ENCOUNTER — Other Ambulatory Visit: Payer: Self-pay | Admitting: Nurse Practitioner

## 2017-05-13 DIAGNOSIS — F419 Anxiety disorder, unspecified: Secondary | ICD-10-CM

## 2017-05-20 ENCOUNTER — Ambulatory Visit (INDEPENDENT_AMBULATORY_CARE_PROVIDER_SITE_OTHER): Payer: Medicare HMO | Admitting: Nurse Practitioner

## 2017-05-20 ENCOUNTER — Other Ambulatory Visit: Payer: Self-pay

## 2017-05-20 ENCOUNTER — Encounter: Payer: Self-pay | Admitting: Nurse Practitioner

## 2017-05-20 VITALS — BP 141/89 | HR 109 | Temp 98.0°F | Ht 64.25 in | Wt 165.6 lb

## 2017-05-20 DIAGNOSIS — K2100 Gastro-esophageal reflux disease with esophagitis, without bleeding: Secondary | ICD-10-CM

## 2017-05-20 DIAGNOSIS — F319 Bipolar disorder, unspecified: Secondary | ICD-10-CM

## 2017-05-20 DIAGNOSIS — F419 Anxiety disorder, unspecified: Secondary | ICD-10-CM

## 2017-05-20 MED ORDER — OMEPRAZOLE 40 MG PO CPDR: 40.0000 mg | DELAYED_RELEASE_CAPSULE | Freq: Every day | ORAL | 3 refills | Status: DC

## 2017-05-20 MED ORDER — CLONAZEPAM 1 MG PO TABS
1.0000 mg | ORAL_TABLET | Freq: Two times a day (BID) | ORAL | 1 refills | Status: DC | PRN
Start: 2017-06-27 — End: 2017-08-23

## 2017-05-20 MED ORDER — QUETIAPINE FUMARATE 50 MG PO TABS: 50.0000 mg | ORAL_TABLET | Freq: Every day | ORAL | 3 refills | Status: DC

## 2017-05-20 NOTE — Patient Instructions (Addendum)
Janessa, Thank you for coming in to clinic today.  1. REDUCE clonazepam to 1 mg in am and 1 mg at bedtime. - Your next fill will be on 06/27/17  2. Continue omeprazole 40 mg once daily.  Please schedule a follow-up appointment with Wilhelmina McardleLauren Lashae Wollenberg, AGNP. Return in about 3 months (around 08/20/2017) for anxiety.  If you have any other questions or concerns, please feel free to call the clinic or send a message through MyChart. You may also schedule an earlier appointment if necessary.  You will receive a survey after today's visit either digitally by e-mail or paper by [employer] mail. Your experiences and feedback matter to us.  Please respond so we know how we are doing as we provide care for you.   Wilhelmina McardleLauren Adanely Reynoso, DNP, AGNP-BC Adult Gerontology Nurse Practitioner [facility], [facility]

## 2017-05-20 NOTE — Progress Notes (Signed)
Subjective:    Patient ID: Katrina Sutton, female    DOB: [birth year], 67 y.o.   MRN: [id number]  Katrina Sutton is a 67 y.o. female presenting on 05/20/2017 for Depression (anxiety)   HPI  Anxiety and depression Patient presents today for follow-up of her anxiety and depression.  She admits today that she has bipolar 1 disorder.  She was previously on lithium and Depakote in the past.  Most recently she has been managed per primary care and with Dr. Maryellen Pile.  She has not seen psychiatry in many years. -Patient has failed SSRI therapy with fluoxetine and duloxetine. -Patient has not yet tried Seroquel because she could not cut 100 mg tablets to 50 mg. -After last visit patient had refilled prescription of clonazepam from Dr. Maryellen Pile.  Called pharmacy and canceled all existing prescriptions.  Today pill count includes: - 0.5 mg tablets - 42 tablets remain  - 1 mg tablets - 56.5 tablets remain  Patient is tolerating Paxil 40 mg tablet very well.  She now feels more stable.  Reports less anxiety, less depression, and less mania.  Most concerning symptom remains increased stress in response to her husband's hoarding.  This is when she requires use of clonazepam most.  Patient is currently taking 1 mg in the morning, 0.5 mg at noon, and 1 mg at bedtime.   Social History   Tobacco Use  . Smoking status: Former Smoker    Last attempt to quit: 12/27/1976    Years since quitting: 40.4  . Smokeless tobacco: Never Used  Substance Use Topics  . Alcohol use: No  . Drug use: No    Review of Systems Per HPI unless specifically indicated above     Objective:    BP (!) 141/89 (BP Location: Right Arm, Patient Position: Sitting, Cuff Size: Normal)   Pulse (!) 109   Temp 98 F (36.7 C) (Oral)   Ht 5' 4.25" (1.632 m)   Wt 165 lb 9.6 oz (75.1 kg)   BMI 28.20 kg/m   Wt Readings from Last 3 Encounters:  05/20/17 165 lb 9.6 oz (75.1 kg)  04/22/17 165 lb 6.4 oz (75 kg)  03/25/17 165 lb 12.8 oz (75.2 kg)      Physical Exam  Constitutional: She is oriented to person, place, and time. She appears well-developed and well-nourished. No distress.  HENT:  Head: Normocephalic and atraumatic.  Cardiovascular: Normal rate, regular rhythm, S1 normal, S2 normal, normal heart sounds and intact distal pulses.  Pulmonary/Chest: Effort normal and breath sounds normal. No respiratory distress.  Neurological: She is alert and oriented to person, place, and time.  Skin: Skin is warm and dry.  Psychiatric: She has a normal mood and affect. Her speech is normal and behavior is normal. Judgment and thought content normal. Cognition and memory are normal.  Patient is not presenting in a manic state today.  Dressed logically for the weather and with patterns that match.  Thoughts are coherent and cohesive.  Vitals reviewed.  No results found for this or any previous visit.    Assessment & Plan:   Problem List Items Addressed This Visit      Digestive   GERD (gastroesophageal reflux disease)   Relevant Medications   omeprazole (PRILOSEC) 40 MG capsule     Other   Anxiety - Primary   Relevant Medications   clonazePAM (KLONOPIN) 1 MG tablet (Start on 06/27/2017)    Other Visit Diagnoses    Bipolar 1 disorder (HCC)  Relevant Medications   QUEtiapine (SEROQUEL) 50 MG tablet   clonazePAM (KLONOPIN) 1 MG tablet (Start on 06/27/2017)        Meds ordered this encounter  Medications  . QUEtiapine (SEROQUEL) 50 MG tablet    Sig: Take 1 tablet (50 mg total) by mouth at bedtime.    Dispense:  30 tablet    Refill:  3    Order Specific Question:   Supervising Provider    Answer:   Smitty CordsKARAMALEGOS, ALEXANDER J [[id number]]  . clonazePAM (KLONOPIN) 1 MG tablet    Sig: Take 1 tablet (1 mg total) by mouth 2 (two) times daily as needed for anxiety.    Dispense:  60 tablet    Refill:  1    Order Specific Question:   Supervising Provider    Answer:   Smitty CordsKARAMALEGOS, ALEXANDER J [[id number]]  . omeprazole (PRILOSEC) 40 MG  capsule    Sig: Take 1 capsule (40 mg total) by mouth daily.    Dispense:  90 capsule    Refill:  3    May convert to tablet if needed for pt preference.    Order Specific Question:   Supervising Provider    Answer:   Smitty CordsKARAMALEGOS, ALEXANDER J [[id number]]   #1 bipolar 1 disorder, anxiety, depression: Patient presents today with managed anxiety, depression, and bipolar disorder.  Patient reports mostly normal days without mania and without depression.  Stress is mostly manageable.  Patient is tolerating Paxil and clonazepam taper well.  She has not started Seroquel, which will help periods of mania.    Plan: 1.  Continue Paxil 40 mg once daily. 2.  Start Seroquel 50 mg once daily. 3.  Continue clonazepam taper to total daily dose of 2 mg/day.  Patient to take 1 mg in a.m. and 1 mg at bedtime.  New prescription provided to be refilled on April 15 when patient will run out of current supply based on today's pill count. 4.  Discussed option of going to psychiatry for management.  Patient currently declines as she wants to try Seroquel first. 4.  Follow-up 3 months with sooner follow-up as needed.  Patient verbalizes understanding.  #2 GERD: Currently well controlled without breakthrough heartburn symptoms.  Patient is taking omeprazole 40 mg once daily and tolerating well.  Continue omeprazole 40 mg once daily.  Refill provided.   Follow up plan: Return in about 3 months (around 08/20/2017) for anxiety.  A total of 30 minutes was spent face-to-face with this patient. Greater than 50% of this time was spent in counseling and coordination of care with the patient.   Wilhelmina McardleLauren Yatzary Merriweather, DNP, AGPCNP-BC Adult Gerontology Primary Care Nurse Practitioner [facility] 05/20/2017, 3:40 PM

## 2017-05-31 ENCOUNTER — Telehealth: Payer: Self-pay

## 2017-05-31 NOTE — Telephone Encounter (Signed)
Patient calling to reschedule exercise test Please call

## 2017-05-31 NOTE — Telephone Encounter (Signed)
Routed to Support Pool to reschedule GXT.

## 2017-06-29 DIAGNOSIS — M5416 Radiculopathy, lumbar region: Secondary | ICD-10-CM | POA: Diagnosis not present

## 2017-06-29 DIAGNOSIS — M9903 Segmental and somatic dysfunction of lumbar region: Secondary | ICD-10-CM | POA: Diagnosis not present

## 2017-06-29 DIAGNOSIS — M9905 Segmental and somatic dysfunction of pelvic region: Secondary | ICD-10-CM | POA: Diagnosis not present

## 2017-06-29 DIAGNOSIS — M955 Acquired deformity of pelvis: Secondary | ICD-10-CM | POA: Diagnosis not present

## 2017-07-05 DIAGNOSIS — M955 Acquired deformity of pelvis: Secondary | ICD-10-CM | POA: Diagnosis not present

## 2017-07-05 DIAGNOSIS — M5416 Radiculopathy, lumbar region: Secondary | ICD-10-CM | POA: Diagnosis not present

## 2017-07-05 DIAGNOSIS — M9903 Segmental and somatic dysfunction of lumbar region: Secondary | ICD-10-CM | POA: Diagnosis not present

## 2017-07-05 DIAGNOSIS — M9905 Segmental and somatic dysfunction of pelvic region: Secondary | ICD-10-CM | POA: Diagnosis not present

## 2017-07-12 DIAGNOSIS — M9903 Segmental and somatic dysfunction of lumbar region: Secondary | ICD-10-CM | POA: Diagnosis not present

## 2017-07-12 DIAGNOSIS — M5416 Radiculopathy, lumbar region: Secondary | ICD-10-CM | POA: Diagnosis not present

## 2017-07-12 DIAGNOSIS — M955 Acquired deformity of pelvis: Secondary | ICD-10-CM | POA: Diagnosis not present

## 2017-07-12 DIAGNOSIS — M9905 Segmental and somatic dysfunction of pelvic region: Secondary | ICD-10-CM | POA: Diagnosis not present

## 2017-07-26 DIAGNOSIS — M5416 Radiculopathy, lumbar region: Secondary | ICD-10-CM | POA: Diagnosis not present

## 2017-07-26 DIAGNOSIS — M955 Acquired deformity of pelvis: Secondary | ICD-10-CM | POA: Diagnosis not present

## 2017-07-26 DIAGNOSIS — M9903 Segmental and somatic dysfunction of lumbar region: Secondary | ICD-10-CM | POA: Diagnosis not present

## 2017-07-26 DIAGNOSIS — M9905 Segmental and somatic dysfunction of pelvic region: Secondary | ICD-10-CM | POA: Diagnosis not present

## 2017-07-28 ENCOUNTER — Ambulatory Visit (INDEPENDENT_AMBULATORY_CARE_PROVIDER_SITE_OTHER): Payer: Medicare HMO | Admitting: Nurse Practitioner

## 2017-07-28 ENCOUNTER — Other Ambulatory Visit: Payer: Self-pay

## 2017-07-28 ENCOUNTER — Encounter: Payer: Self-pay | Admitting: Nurse Practitioner

## 2017-07-28 VITALS — BP 120/73 | HR 78 | Temp 98.3°F | Resp 18 | Ht 64.25 in | Wt 163.8 lb

## 2017-07-28 DIAGNOSIS — J019 Acute sinusitis, unspecified: Secondary | ICD-10-CM | POA: Diagnosis not present

## 2017-07-28 DIAGNOSIS — K2100 Gastro-esophageal reflux disease with esophagitis, without bleeding: Secondary | ICD-10-CM

## 2017-07-28 MED ORDER — OMEPRAZOLE 20 MG PO CPDR: 20.0000 mg | DELAYED_RELEASE_CAPSULE | Freq: Two times a day (BID) | ORAL | 3 refills | Status: DC

## 2017-07-28 MED ORDER — IPRATROPIUM BROMIDE 0.06 % NA SOLN: 2.0000 | Freq: Four times a day (QID) | NASAL | 0 refills | Status: DC

## 2017-07-28 NOTE — Progress Notes (Signed)
Subjective:    Patient ID: Katrina Sutton, female    DOB: November 07, 1950, 67 y.o.   MRN: [id number]  Katrina Sutton is a 67 y.o. female presenting on 07/28/2017 for Cough (weakness,nasal drainage w/ clear mucus, and facial pressure x 4 days ); Gastroesophageal Reflux; and Tachycardia (while lying down, pt states she feel pulsation even in her ears. Possible stress related.)   HPI Palpitations - stress husband with surgery - pulsations in ears - increased requency at night.  None today sitting in clinic.  No headaches, no shortness of breath with palpitations.  Worsened around start of the cold recently.  Rest is helping these get better when they happen during the day with exertion. - Clonazepam count - 42 remain - pt is not taking two daily, but admits she is forgetting to take some -  Notes more palpitations when she is not taking clonazepam.  URI Cough/Cold symptoms weakness, body aches, nasal drainage, stuffiness, cough, facial/sinus pressure.  Mild jaw pain, but resolving and "breaking up" today.  Onset Monday (4 days ago).  Mild fever yesterday.  Acetaminophen 500 mg 1-3 times daily every 6 hours.  - No OTC medications other than  Delsym - DM  GERD  Is having more reflux at night.  Also eats more when stressed, so GERD is worse with higher stress also.  Social History   Tobacco Use  . Smoking status: Former Smoker    Last attempt to quit: 12/27/1976    Years since quitting: 40.6  . Smokeless tobacco: Never Used  Substance Use Topics  . Alcohol use: No  . Drug use: No    Review of Systems Per HPI unless specifically indicated above     Objective:    BP 120/73 (BP Location: Right Arm, Patient Position: Sitting, Cuff Size: Normal)   Pulse 78   Temp 98.3 F (36.8 C) (Oral)   Resp 18   Ht 5' 4.25" (1.632 m)   Wt 163 lb 12.8 oz (74.3 kg)   SpO2 94%   BMI 27.90 kg/m   Wt Readings from Last 3 Encounters:  07/28/17 163 lb 12.8 oz (74.3 kg)  05/20/17 165 lb 9.6 oz (75.1 kg)    04/22/17 165 lb 6.4 oz (75 kg)    Physical Exam  Constitutional: She appears well-developed and well-nourished. She appears distressed (mildly).  HENT:  Head: Normocephalic and atraumatic.  Right Ear: Hearing, tympanic membrane, external ear and ear canal normal. Tympanic membrane is not injected, not scarred, not perforated and not erythematous. No middle ear effusion.  Left Ear: Hearing, tympanic membrane, external ear and ear canal normal. Tympanic membrane is not injected, not scarred, not perforated and not erythematous.  No middle ear effusion.  Nose: Mucosal edema and rhinorrhea present. Right sinus exhibits no maxillary sinus tenderness and no frontal sinus tenderness. Left sinus exhibits no maxillary sinus tenderness and no frontal sinus tenderness.  Mouth/Throat: Uvula is midline and mucous membranes are normal. Posterior oropharyngeal edema (cobblestoning) and posterior oropharyngeal erythema (mildly injected) present. Oropharyngeal exudate: clear secretions.  Neck: Normal range of motion. Neck supple.  Cardiovascular: Normal rate, regular rhythm, S1 normal, S2 normal, normal heart sounds and intact distal pulses.  Pulmonary/Chest: Effort normal and breath sounds normal. No respiratory distress.  Abdominal: Soft. Bowel sounds are normal. She exhibits no distension. There is no hepatosplenomegaly. There is no tenderness. No hernia.  Lymphadenopathy:    She has no cervical adenopathy.  Neurological: She is alert.  Skin: Skin is warm  and dry. Capillary refill takes less than 2 seconds.  Psychiatric: She has a normal mood and affect. Her behavior is normal. Judgment and thought content normal.  Vitals reviewed.    No results found for this or any previous visit.    Assessment & Plan:   Problem List Items Addressed This Visit      Digestive   GERD (gastroesophageal reflux disease) Slightly worsening and uncontrolled.  CHANGE dosing of omeprazole to 20 mg bid 30 min before meals.  Avoid known triggers. Follow-up 6 weeks with anxiety.   Relevant Medications   omeprazole (PRILOSEC) 20 MG capsule    Other Visit Diagnoses    Acute rhinosinusitis    -  Primary Acute illness. Fever responsive to NSAIDs and tylenol.  Symptoms not worsening. Consistent with viral illness x 4 days with known sick contacts and no identifiable focal infections of ears, nose, throat.  Palpitations possibly r/t CF cold medication with phenylephrine or pseudoephedrine.   Plan: 1. Reassurance, likely self-limited with cough lasting up to few weeks - Start Atrovent nasal spray decongestant 2 sprays each nostril up to 4 times daily for 5-7 days - Start anti-histamine Loratadine or Cetirizine  daily,  - also can use Flonase 2 sprays each nostril daily for up to 4-6 weeks - Start Mucinex-DM OTC up to 7-10 days then stop - Avoid all decongestants in OTC meds to reduce palpitations 2. Supportive care with nasal saline, warm herbal tea with honey, 3. Improve hydration 4. Tylenol / Motrin PRN fevers 5. Return criteria given       Meds ordered this encounter  Medications  . omeprazole (PRILOSEC) 20 MG capsule    Sig: Take 1 capsule (20 mg total) by mouth 2 (two) times daily before a meal.    Dispense:  180 capsule    Refill:  3    Order Specific Question:   Supervising Provider    Answer:   Smitty Cords [[id number]]  . ipratropium (ATROVENT) 0.06 % nasal spray    Sig: Place 2 sprays into both nostrils 4 (four) times daily for 5 days.    Dispense:  15 mL    Refill:  0    Order Specific Question:   Supervising Provider    Answer:   Smitty Cords [[id number]]    Follow up plan: Return for as scheduled for anxiety.  Wilhelmina Mcardle, DNP, AGPCNP-BC Adult Gerontology Primary Care Nurse Practitioner [facility] 07/28/2017, 2:12 PM

## 2017-07-28 NOTE — Patient Instructions (Addendum)
Katrina Sutton,   Thank you for coming in to clinic today.  1. It sounds like you have a Upper Respiratory Virus - this will most likely run it's course in 7 to 10 days. Recommend good hand washing. - Start Atrovent nasal spray decongestant 2 sprays each nostril up to 4 times daily for 5-7 days - also can use Flonase 2 sprays each nostril daily for up to 4-6 weeks - If congestion is worse, start OTC Mucinex (or may try Delsym, or Delsym-DM, Mucinex-DM for cough) up to 7-10 days then stop - Drink plenty of fluids to improve congestion - You may try over the counter Nasal Saline spray (Simply Saline, Ocean Spray) as needed to reduce congestion. - Drink warm herbal tea with honey for sore throat. - Start taking Tylenol extra strength 1 to 2 tablets every 6-8 hours for aches or fever/chills for next few days as needed.  Do not take more than 3,000 mg in 24 hours from all medicines.  May take Ibuprofen as well if tolerated 200-400mg  every 8 hours as needed.  If symptoms significantly worsening with persistent fevers/chills despite tylenol/ibpurofen, nausea, vomiting unable to tolerate food/fluids or medicine, body aches, or shortness of breath, sinus pain pressure or worsening productive cough, then follow-up for re-evaluation, may seek more immediate care at Urgent Care or ED if more concerned for emergency.   2. For heartburn:  - CHANGE to omeprazole 20 mg twice daily about 30 minutes before meals.   3. Palpitations: - these are related to anxiety.  Make sure you take clonazepam if you need it. CHECK to avoid all decongestants in over the counter medicines.  DO NOT use "CF" cough medications.  Please schedule a follow-up appointment with Wilhelmina Mcardle, AGNP. Return for as scheduled for anxiety.  If you have any other questions or concerns, please feel free to call the clinic or send a message through MyChart. You may also schedule an earlier appointment if necessary.  You will receive a survey  after today's visit either digitally by e-mail or paper by [employer]. Your experiences and feedback matter to [place].  Please respond so we know how we are doing as we provide care for you.   Wilhelmina Mcardle, DNP, AGNP-BC Adult Gerontology Nurse Practitioner [facility]

## 2017-08-09 DIAGNOSIS — M9905 Segmental and somatic dysfunction of pelvic region: Secondary | ICD-10-CM | POA: Diagnosis not present

## 2017-08-09 DIAGNOSIS — M5416 Radiculopathy, lumbar region: Secondary | ICD-10-CM | POA: Diagnosis not present

## 2017-08-09 DIAGNOSIS — M955 Acquired deformity of pelvis: Secondary | ICD-10-CM | POA: Diagnosis not present

## 2017-08-09 DIAGNOSIS — M9903 Segmental and somatic dysfunction of lumbar region: Secondary | ICD-10-CM | POA: Diagnosis not present

## 2017-08-11 ENCOUNTER — Telehealth: Payer: Self-pay

## 2017-08-11 NOTE — Telephone Encounter (Signed)
The pt was notified. No questions or concerns. 

## 2017-08-11 NOTE — Telephone Encounter (Signed)
Attempted to contact the pt, no answer. No vm. I will try again later.

## 2017-08-11 NOTE — Telephone Encounter (Signed)
Pt should use an over the counter dulcolax suppository today.  It is ok to have BM every 1-3 days.  This is normal.  If no BM in 4 days, will need to use dulcolax by mouth or suppository again in future. If using dulcolax for every BM, pt will need office visit.

## 2017-08-11 NOTE — Telephone Encounter (Signed)
Pt called complaining of constipation, no bm in 2-3 days. Pt state she is taking the Mirelax as directed and drinking plenty of water, but no improvement.Please advise

## 2017-08-23 ENCOUNTER — Ambulatory Visit (INDEPENDENT_AMBULATORY_CARE_PROVIDER_SITE_OTHER): Payer: Medicare HMO | Admitting: Nurse Practitioner

## 2017-08-23 ENCOUNTER — Encounter: Payer: Self-pay | Admitting: Nurse Practitioner

## 2017-08-23 ENCOUNTER — Other Ambulatory Visit: Payer: Self-pay

## 2017-08-23 DIAGNOSIS — F419 Anxiety disorder, unspecified: Secondary | ICD-10-CM | POA: Diagnosis not present

## 2017-08-23 DIAGNOSIS — F319 Bipolar disorder, unspecified: Secondary | ICD-10-CM

## 2017-08-23 MED ORDER — PAROXETINE HCL 40 MG PO TABS: 40.0000 mg | ORAL_TABLET | Freq: Every day | ORAL | 5 refills | Status: DC

## 2017-08-23 MED ORDER — CLONAZEPAM 1 MG PO TABS: 1.0000 mg | ORAL_TABLET | Freq: Two times a day (BID) | ORAL | 2 refills | Status: DC | PRN

## 2017-08-23 NOTE — Progress Notes (Signed)
Subjective:    Patient ID: Katrina Sutton, female    DOB: 07-07-1950, 67 y.o.   MRN: [id number]  Katrina Sutton is a 67 y.o. female presenting on 08/23/2017 for Anxiety   HPI Anxiety Feels she has been doing really well since last visit except over the last 2 weeks when she was running out of medication.  Clonazepam was inadvertently written for 30-day supply with Sutton refill and expired 30 days after written.  Reports not sleeping very well at night, but is "a night owl" and only sleeps when she gets really tired.  She regularly sleeps about 5 hours per night and feels rested when she wakes.  - She notes she is managing her stresses fairly well currently.  Stress at home has not changed, but she is managing well.  She utilizes her faith support daily with prayer and meditation. - Is taking paxil 40 mg tolerating well without side effects. - In the last 2 weeks, patient has been taking half milligram clonazepam 3 times daily.  Previously, patient was taking Sutton mg twice daily.  With Sutton.5 mg TDD, patient has been experiencing dry mouth, racing heart rate, tingling fingers which is consistent with benzo withdrawal.  She states before reducing clonazepam her depression and anxiety screening scores would have been much lower.  Depression screen Katrina Sutton 2/9 08/23/2017 05/20/2017 04/22/2017 12/27/2016 12/27/2016  Decreased Interest 0 0 0 0 0  Down, Depressed, Hopeless 0 Sutton 0 0 0  PHQ - 2 Score 0 Sutton 0 0 0  Altered sleeping Sutton 0 0 Sutton -  Tired, decreased energy Sutton Sutton 2 Sutton  -  Change in appetite Sutton 0 0 0 -  Feeling bad or failure about yourself  0 0 0 Sutton -  Trouble concentrating 0 0 0 0 -  Moving slowly or fidgety/restless Sutton 0 Sutton Sutton -  Suicidal thoughts 0 0 0 0 -  PHQ-9 Score 4 2 3 4  -  Difficult doing work/chores Not difficult at all Not difficult at all Not difficult at all Somewhat difficult -    GAD 7 : Generalized Anxiety Score 08/23/2017 05/20/2017 04/22/2017 12/27/2016  Nervous, Anxious, on Edge 3 0 Sutton Sutton  Control/stop  worrying 0 0 Sutton Sutton  Worry too much - different things Sutton 0 0 Sutton  Trouble relaxing Sutton 0 0 Sutton  Restless 0 0 0 Sutton  Easily annoyed or irritable Sutton Sutton 2 Sutton   Afraid - awful might happen Sutton 0 0 0  Total GAD 7 Score 7 Sutton 4 6   Anxiety Difficulty Not difficult at all Somewhat difficult Not difficult at all Somewhat difficult    Social History   Tobacco Use  . Smoking status: Former Smoker    Last attempt to quit: 12/27/1976    Years since quitting: 40.6  . Smokeless tobacco: Never Used  Substance Use Topics  . Alcohol use: No  . Drug use: No    Review of Systems Per HPI unless specifically indicated above     Objective:    BP (!) 131/92 (BP Location: Right Arm, Patient Position: Sitting, Cuff Size: Normal)   Pulse 78   Temp 97.7 F (36.5 C) (Oral)   Ht 5' 4.25" (Sutton.632 m)   Wt 162 lb 9.6 oz (73.8 kg)   BMI 27.69 kg/m   Wt Readings from Last 3 Encounters:  08/23/17 162 lb 9.6 oz (73.8 kg)  07/28/17 163 lb 12.8 oz (74.3 kg)  05/20/17 165 lb 9.6 oz (75.Sutton kg)  Physical Exam  Constitutional: She is oriented to person, place, and time. She appears well-developed and well-nourished. No distress.  HENT:  Head: Normocephalic and atraumatic.  Cardiovascular: Normal rate, regular rhythm, S1 normal, S2 normal, normal heart sounds and intact distal pulses.  Pulmonary/Chest: Effort normal and breath sounds normal. No respiratory distress.  Neurological: She is alert and oriented to person, place, and time.  Skin: Skin is warm and dry.  Psychiatric: Her speech is normal and behavior is normal. Judgment and thought content normal. Her mood appears anxious (Mildly). Cognition and memory are normal. She expresses no homicidal and no suicidal ideation. She expresses no suicidal plans and no homicidal plans. She is attentive.  Vitals reviewed.       Assessment & Plan:   Problem List Items Addressed This Visit      Other   Anxiety   Relevant Medications   clonazePAM (KLONOPIN) Sutton MG tablet    PARoxetine (PAXIL) 40 MG tablet   Katrina Sutton disorder (HCC)   Relevant Medications   clonazePAM (KLONOPIN) Sutton MG tablet    Stable anxiety and Katrina disorder when on prescribed regimen of medications.  Last 2 weeks has had disruption of normal stable pattern after patient self reduced clonazepam to extend prescription to appointment.  Patient continues to describe symptoms consistent with anxiety versus hypomania with Katrina disorder.  Patient currently well treated with paroxetine.  Suspect patient may have been previously misdiagnosed with Katrina disorder.  No prior hospitalizations were required and symptoms most consistent with anxiety and irritability related to situations that resolves with appropriate coping skills and medication.  Plan: Sutton.  Continue with healthy coping skills. 2.  Resume prior dose of clonazepam Sutton mg twice daily.  90-day supply (30 days +3 refills) sent to pharmacy. 3.  Continue fluoxetine 40 mg once daily. 4.  Consider referral back to psychiatry for evaluation of Katrina disorder if symptoms continue to be suboptimally controlled. 5.  Follow-up 3 months and as needed   Meds ordered this encounter  Medications  . clonazePAM (KLONOPIN) Sutton MG tablet    Sig: Take Sutton tablet (Sutton mg total) by mouth 2 (two) times daily as needed for anxiety.    Dispense:  60 tablet    Refill:  2    Order Specific Question:   Supervising Provider    Answer:   Smitty CordsKARAMALEGOS, ALEXANDER J [[id number]]  . PARoxetine (PAXIL) 40 MG tablet    Sig: Take Sutton tablet (40 mg total) by mouth daily.    Dispense:  30 tablet    Refill:  5    Order Specific Question:   Supervising Provider    Answer:   Smitty CordsKARAMALEGOS, ALEXANDER J [[id number]]    Follow up plan: Return in about 3 months (around 11/23/2017) for anxiety, GERD.  Katrina McardleLauren Petra Dumler, Katrina Sutton, Katrina Sutton Adult Gerontology Primary Care Nurse Practitioner Katrina Sutton 08/23/2017, Sutton:24 PM

## 2017-08-23 NOTE — Patient Instructions (Addendum)
Katrina Sutton,   Thank you for coming in to clinic today.  1. Continue current medications without changes.   Please schedule a follow-up appointment with Wilhelmina Mcardle, AGNP. Return in about 3 months (around 11/23/2017) for anxiety, GERD.  If you have any other questions or concerns, please feel free to call the clinic or send a message through MyChart. You may also schedule an earlier appointment if necessary.  You will receive a survey after today's visit either digitally by e-mail or paper by [employer]. Your experiences and feedback matter to [place].  Please respond so we know how we are doing as we provide care for you.   Wilhelmina Mcardle, DNP, AGNP-BC Adult Gerontology Nurse Practitioner [facility], [facility]     Living With Anxiety After being diagnosed with an anxiety disorder, you may be relieved to know why you have felt or behaved a certain way. It is natural to also feel overwhelmed about the treatment ahead and what it will mean for your life. With care and support, you can manage this condition and recover from it. How to cope with anxiety Dealing with stress Stress is your body's reaction to life changes and events, both good and bad. Stress can last just a few hours or it can be ongoing. Stress can play a major role in anxiety, so it is important to learn both how to cope with stress and how to think about it differently. Talk with your health care provider or a counselor to learn more about stress reduction. He or she may suggest some stress reduction techniques, such as:  Music therapy. This can include creating or listening to music that you enjoy and that inspires you.  Mindfulness-based meditation. This involves being aware of your normal breaths, rather than trying to control your breathing. It can be done while sitting or walking.  Centering prayer. This is a kind of meditation that involves focusing on a word, phrase, or sacred image that is meaningful to  you and that brings you peace.  Deep breathing. To do this, expand your stomach and inhale slowly through your nose. Hold your breath for 3-5 seconds. Then exhale slowly, allowing your stomach muscles to relax.  Self-talk. This is a skill where you identify thought patterns that lead to anxiety reactions and correct those thoughts.  Muscle relaxation. This involves tensing muscles then relaxing them.  Choose a stress reduction technique that fits your lifestyle and personality. Stress reduction techniques take time and practice. Set aside 5-15 minutes a day to do them. Therapists can offer training in these techniques. The training may be covered by some insurance plans. Other things you can do to manage stress include:  Keeping a stress diary. This can help you learn what triggers your stress and ways to control your response.  Thinking about how you respond to certain situations. You may not be able to control everything, but you can control your reaction.  Making time for activities that help you relax, and not feeling guilty about spending your time in this way.  Therapy combined with coping and stress-reduction skills provides the best chance for successful treatment. Medicines Medicines can help ease symptoms. Medicines for anxiety include:  Anti-anxiety drugs.  Antidepressants.  Beta-blockers.  Medicines may be used as the main treatment for anxiety disorder, along with therapy, or if other treatments are not working. Medicines should be prescribed by a health care provider. Relationships Relationships can play a big part in helping you recover.  Try to spend more time connecting with trusted friends and family members. Consider going to couples counseling, taking family education classes, or going to family therapy. Therapy can help you and others better understand the condition. How to recognize changes in your condition Everyone has a different response to treatment for  anxiety. Recovery from anxiety happens when symptoms decrease and stop interfering with your daily activities at home or work. This may mean that you will start to:  Have better concentration and focus.  Sleep better.  Be less irritable.  Have more energy.  Have improved memory.  It is important to recognize when your condition is getting worse. Contact your health care provider if your symptoms interfere with home or work and you do not feel like your condition is improving. Where to find help and support: You can get help and support from these sources:  Self-help groups.  Online and [employer] organizations.  A trusted spiritual leader.  Couples counseling.  Family education classes.  Family therapy.  Follow these instructions at home:  Eat a healthy diet that includes plenty of vegetables, fruits, whole grains, low-fat dairy products, and lean protein. Do not eat a lot of foods that are high in solid fats, added sugars, or salt.  Exercise. Most adults should do the following: ? Exercise for at least 150 minutes each week. The exercise should increase your heart rate and make you sweat (moderate-intensity exercise). ? Strengthening exercises at least twice a week.  Cut down on caffeine, tobacco, alcohol, and other potentially harmful substances.  Get the right amount and quality of sleep. Most adults need 7-9 hours of sleep each night.  Make choices that simplify your life.  Take over-the-counter and prescription medicines only as told by your health care provider.  Avoid caffeine, alcohol, and certain over-the-counter cold medicines. These may make you feel worse. Ask your pharmacist which medicines to avoid.  Keep all follow-up visits as told by your health care provider. This is important. Questions to ask your health care provider  Would I benefit from therapy?  How often should I follow up with a health care provider?  How long do I need to take  medicine?  Are there any long-term side effects of my medicine?  Are there any alternatives to taking medicine? Contact a health care provider if:  You have a hard time staying focused or finishing daily tasks.  You spend many hours a day feeling worried about everyday life.  You become exhausted by worry.  You start to have headaches, feel tense, or have nausea.  You urinate more than normal.  You have diarrhea. Get help right away if:  You have a racing heart and shortness of breath.  You have thoughts of hurting yourself or others. If you ever feel like you may hurt yourself or others, or have thoughts about taking your own life, get help right away. You can go to your nearest emergency department or call:  Your local emergency services (911 in the [place]  A suicide crisis helpline, such as the National Suicide Prevention Lifeline at 207-730-74491-609-435-2094. This is open 24-hours a day.  Summary  Taking steps to deal with stress can help calm you.  Medicines cannot cure anxiety disorders, but they can help ease symptoms.  Family, friends, and partners can play a big part in helping you recover from an anxiety disorder. This information is not intended to replace advice given to you by your health care provider. Make sure you discuss  any questions you have with your health care provider. Document Released: 02/24/2016 Document Revised: 02/24/2016 Document Reviewed: 02/24/2016 Elsevier Interactive Patient Education  [employer].

## 2017-08-25 DIAGNOSIS — M9903 Segmental and somatic dysfunction of lumbar region: Secondary | ICD-10-CM | POA: Diagnosis not present

## 2017-08-25 DIAGNOSIS — M5416 Radiculopathy, lumbar region: Secondary | ICD-10-CM | POA: Diagnosis not present

## 2017-08-25 DIAGNOSIS — M955 Acquired deformity of pelvis: Secondary | ICD-10-CM | POA: Diagnosis not present

## 2017-08-25 DIAGNOSIS — M9905 Segmental and somatic dysfunction of pelvic region: Secondary | ICD-10-CM | POA: Diagnosis not present

## 2017-08-29 ENCOUNTER — Telehealth: Payer: Self-pay | Admitting: Nurse Practitioner

## 2017-09-11 ENCOUNTER — Encounter: Payer: Self-pay | Admitting: Nurse Practitioner

## 2017-10-28 ENCOUNTER — Other Ambulatory Visit: Payer: Self-pay

## 2017-10-28 ENCOUNTER — Ambulatory Visit (INDEPENDENT_AMBULATORY_CARE_PROVIDER_SITE_OTHER): Payer: Medicare HMO | Admitting: Nurse Practitioner

## 2017-10-28 ENCOUNTER — Encounter: Payer: Self-pay | Admitting: Nurse Practitioner

## 2017-10-28 VITALS — BP 137/86 | HR 108 | Temp 97.5°F | Ht 64.5 in | Wt 164.0 lb

## 2017-10-28 DIAGNOSIS — F5104 Psychophysiologic insomnia: Secondary | ICD-10-CM

## 2017-10-28 DIAGNOSIS — F3289 Other specified depressive episodes: Secondary | ICD-10-CM | POA: Diagnosis not present

## 2017-10-28 DIAGNOSIS — F419 Anxiety disorder, unspecified: Secondary | ICD-10-CM

## 2017-10-28 DIAGNOSIS — K5909 Other constipation: Secondary | ICD-10-CM | POA: Diagnosis not present

## 2017-10-28 MED ORDER — POLYETHYLENE GLYCOL 3350 17 G PO PACK: PACK | ORAL | 0 refills | Status: DC

## 2017-10-28 MED ORDER — TRAZODONE HCL 50 MG PO TABS: ORAL_TABLET | ORAL | 0 refills | Status: DC

## 2017-10-28 NOTE — Patient Instructions (Addendum)
Amayra T Highbaugh,   Thank you for coming in to clinic today.  1. For constipation:  - IF no bowel movement in 1 day, take colace once daily.  Continue until you have a bowel movement.  - IF no bowel movement in 2 days, take miralax 1/2 dose once daily.  Continue until you have a bowel movement.  - IF no bowel movement in 3 days, take senna 1 tablet once daily. Continue until you have a bowel movement.  ALL of these are as needed medications.  2. You have a very small hernia near your umbilicus.  Put gentle pressure over this part of your abdomen when straining. - If it bulges out and stays out or becomes painful, call clinic.    3. For anxiety: - Continue medications without changes. - START trazodone 50 mg tablet.  Take 1/2 tablet at bedtime for 14 days. - START trazodone 1 tablet at bedtime after 2 weeks.  Please schedule a follow-up appointment with Wilhelmina McardleLauren Tallulah Hosman, AGNP. Return in about 3 months (around 01/28/2018) for anxiety.  If you have any other questions or concerns, please feel free to call the clinic or send a message through MyChart. You may also schedule an earlier appointment if necessary.  You will receive a survey after today's visit either digitally by e-mail or paper by [employer] mail. Your experiences and feedback matter to us.  Please respond so we know how we are doing as we provide care for you.   Wilhelmina McardleLauren Lucille Crichlow, DNP, AGNP-BC Adult Gerontology Nurse Practitioner [facility], [facility]

## 2017-10-28 NOTE — Progress Notes (Signed)
Subjective:    Patient ID: Katrina Sutton, female    DOB: 08/09/1950, 67 y.o.   MRN: [id number]  Katrina Sutton is a 67 y.o. female presenting on 10/28/2017 for Hernia (abdominal bulge x 2-3 days. Pt states she's been very constipated.)   HPI Hernia New abdominal bulge.  Has been constipated with straining.  No pain, but is new and concerns patient.   Has had relief with senna until about 1 month ago.  Is taking 2 doses per day.  Now very constipated and strained hard.  Took enema Tuesday night with large BM.    Anxiety Clonazepam 1 in am and 1 in pm.  Patient states she received her last refill about 1 week ago.  Is managing depression and anxiety well.  Paroxetine is helping significantly at this time.   Stopped seroquel because of feeling like she was in a fog. Patient continues to have difficulty falling asleep.  GAD 7 : Generalized Anxiety Score 10/28/2017 08/23/2017 05/20/2017 04/22/2017  Nervous, Anxious, on Edge 2 3 0 1  Control/stop worrying 0 0 0 1  Worry too much - different things 1 1 0 0  Trouble relaxing 3 1 0 0  Restless 0 0 0 0  Easily annoyed or irritable 3 1 1 2   Afraid - awful might happen 0 1 0 0  Total GAD 7 Score 9 7 1 4   Anxiety Difficulty Not difficult at all Not difficult at all Somewhat difficult Not difficult at all    Social History   Tobacco Use  . Smoking status: Former Smoker    Last attempt to quit: 12/27/1976    Years since quitting: 40.8  . Smokeless tobacco: Never Used  Substance Use Topics  . Alcohol use: No  . Drug use: No    Review of Systems Per HPI unless specifically indicated above     Objective:    BP 137/86 (BP Location: Left Arm, Patient Position: Sitting, Cuff Size: Normal)   Pulse (!) 108   Temp (!) 97.5 F (36.4 C) (Oral)   Ht 5' 4.5" (1.638 m)   Wt 164 lb (74.4 kg)   BMI 27.72 kg/m   Wt Readings from Last 3 Encounters:  10/28/17 164 lb (74.4 kg)  08/23/17 162 lb 9.6 oz (73.8 kg)  07/28/17 163 lb 12.8 oz (74.3 kg)      Physical Exam  Constitutional: She is oriented to person, place, and time. She appears well-developed and well-nourished. No distress.  HENT:  Head: Normocephalic and atraumatic.  Cardiovascular: Normal rate, regular rhythm, S1 normal, S2 normal, normal heart sounds and intact distal pulses.  Pulmonary/Chest: Effort normal and breath sounds normal. No respiratory distress.  Abdominal: Soft. She exhibits distension. Bowel sounds are increased. There is no hepatosplenomegaly. There is no tenderness. There is no rigidity, no rebound, no guarding, no CVA tenderness, no tenderness at McBurney's point and negative Murphy's sign. A hernia is present. Hernia confirmed positive in the ventral area (periumbilical hernia, easily reducible approximately 2 cm round above umbilicus).  Neurological: She is alert and oriented to person, place, and time.  Skin: Skin is warm and dry.  Psychiatric: She has a normal mood and affect. Her behavior is normal.  Vitals reviewed.     Assessment & Plan:   Problem List Items Addressed This Visit      Other   Anxiety   Relevant Medications   traZODone (DESYREL) 50 MG tablet   Depression   Relevant Medications  traZODone (DESYREL) 50 MG tablet    Other Visit Diagnoses    Other constipation    -  Primary   Relevant Medications   polyethylene glycol (MIRALAX) packet   Psychophysiological insomnia       Relevant Medications   traZODone (DESYREL) 50 MG tablet    # Constipation with small periumbilical hernia: Acute constipation with bloating likely secondary to miralax.  New stable and uncomplicated periumbilical hernia easily reducible.  Only impulse felt with coughing, no protrusion of bowel.   1. STOP miralax.  Start senna x 1 dose tonight. Then, follow very specific instructions for OTC laxatives:   - IF no bowel movement in 1 day, take colace once daily prn.    - IF no bowel movement in 2 days, take miralax 1/2 dose once daily prn.    - IF no bowel  movement in 3 days, take senna 1 tablet once daily prn.   2. Monitor periumbilical hernia.  Not currently incarcerated.   3. Followup as needed.   # Anxiety and insomnia:  Stable anxiety, depression and insomnia, but all continue to be partially controlled with paroxetine 40 mg once daily and clonazepam 1 mg bid. PHQ and GAD 7 continue to support moderate anxiety and depresision.  Insomnia is 2/2 anxiety and affects sleep onset and maintenance. - Continue clonazepam and paroxetine without changes  - START trazodone 25 mg at bedtime x 14 days.  Then increase to 50 mg at bedtime and continue.  Reduce clonazepam at bedtime prn.  Patient is motivated to consider reduction of clonazepam with use of Trazodone as she saw this method to be successful with her son. - Followup 3 months.   [place]ally [place] 10/28/17.  According to PMP aware, pt last received a refill on 10/19/2017.  Refill of her controlled substance will be provided when due for refills between visit to get to next appointment only.  Will likely need refill on 11/18/2017.  Check PMP aware at time of refill request.    Meds ordered this encounter  Medications  . polyethylene glycol (MIRALAX) packet    Sig: Take 1/2 dose once daily as needed for no bowel movement in 3 days.    Dispense:  14 each    Refill:  0    Order Specific Question:   Supervising Provider    Answer:   Smitty CordsKARAMALEGOS, ALEXANDER J [[id number]]  . traZODone (DESYREL) 50 MG tablet    Sig: Take 0.5 tablets (25 mg total) by mouth at bedtime for 14 days, THEN 1 tablet (50 mg total) at bedtime.    Dispense:  83 tablet    Refill:  0    Order Specific Question:   Supervising Provider    Answer:   Smitty CordsKARAMALEGOS, ALEXANDER J [[id number]]    Follow up plan: Return in about 3 months (around 01/28/2018) for anxiety.  Katrina McardleLauren Jaaliyah Lucatero, DNP, AGPCNP-BC Adult Gerontology Primary Care Nurse Practitioner [facility] 10/28/2017, 4:19 PM

## 2017-11-09 ENCOUNTER — Telehealth: Payer: Self-pay | Admitting: Nurse Practitioner

## 2017-11-09 DIAGNOSIS — F319 Bipolar disorder, unspecified: Secondary | ICD-10-CM

## 2017-11-09 DIAGNOSIS — F419 Anxiety disorder, unspecified: Secondary | ICD-10-CM

## 2017-11-09 NOTE — Telephone Encounter (Signed)
Pt is having side effects from lexapro - dry mouth, fatigue,heart racing.  Her call back number is [phone]

## 2017-11-09 NOTE — Telephone Encounter (Signed)
Attempted to contact the patient, no answer. LMOM to return my call. ?

## 2017-11-09 NOTE — Telephone Encounter (Signed)
The pt called complaining of intermittent heart palpation, dry mouth and fatigue. She feel like it's a side effect to her Trazodone. She reports that she's still taking 25 MG as directed daily.

## 2017-11-10 MED ORDER — CLONAZEPAM 1 MG PO TABS: 1.0000 mg | ORAL_TABLET | Freq: Two times a day (BID) | ORAL | 2 refills | Status: DC | PRN

## 2017-11-10 NOTE — Telephone Encounter (Signed)
Patient started trazodone in evening as requested, but takes this about 8:30 and goes to bed 11pm - 12am.  Fatigue and dry mouth occurs after taking medication. Fatigue continues in morning until about noon. Patient is continuing to take clonazepam at bedtime as well.  Palpitations are described as only having "a little higher heart rate."  - Reviewed with patient to take no more than 30 minutes before bed, stop clonazepam at bedtime.  Instead, take clonazepam earlier in evening at dinner.  - If symptoms worsen, stop medication.  Continue trazodone at 25 mg without increasing dose until next appointment.  - Due for clonazepam refill.  Patient called her pharmacy and was told she had none remaining.  Consistent with prior fill from this office and PMP Aware.  Reviewed.  Patient continues getting Rx only from us.  No other controlled substances are being filled. Refills were provided per last OV review.  Patient verbalizes understanding of all instructions.

## 2017-11-10 NOTE — Telephone Encounter (Signed)
Open in error

## 2017-11-23 ENCOUNTER — Ambulatory Visit: Payer: Medicare HMO | Admitting: Nurse Practitioner

## 2017-12-08 ENCOUNTER — Telehealth: Payer: Self-pay

## 2017-12-08 NOTE — Telephone Encounter (Signed)
Patient called asking for someone to call her back about her constipation.  [phone]

## 2017-12-08 NOTE — Telephone Encounter (Signed)
-   Use dulcolax suppository x 1 in am tomorrow.   - May also stop senna if not needed.  Called patient and she verbalized understanding.   Should consider Linzess or Amitiza in future.

## 2017-12-08 NOTE — Telephone Encounter (Signed)
Pt called complaining of constipation, no bm in 2 days. She been currently taking 1/2 dose of Mirelax, Senna, and Colace once daily since.  She also been drinking plenty of water and eating high fiber diet, but no improvement. She complains of mild abdominal pain around her hernia. Please advise

## 2018-01-27 ENCOUNTER — Other Ambulatory Visit: Payer: Self-pay

## 2018-01-27 ENCOUNTER — Encounter: Payer: Self-pay | Admitting: Nurse Practitioner

## 2018-01-27 ENCOUNTER — Ambulatory Visit (INDEPENDENT_AMBULATORY_CARE_PROVIDER_SITE_OTHER): Payer: Medicare HMO | Admitting: Nurse Practitioner

## 2018-01-27 VITALS — BP 133/82 | HR 100 | Temp 97.4°F | Ht 64.5 in | Wt 165.6 lb

## 2018-01-27 DIAGNOSIS — R6881 Early satiety: Secondary | ICD-10-CM | POA: Diagnosis not present

## 2018-01-27 DIAGNOSIS — F419 Anxiety disorder, unspecified: Secondary | ICD-10-CM | POA: Diagnosis not present

## 2018-01-27 DIAGNOSIS — K449 Diaphragmatic hernia without obstruction or gangrene: Secondary | ICD-10-CM | POA: Diagnosis not present

## 2018-01-27 DIAGNOSIS — F319 Bipolar disorder, unspecified: Secondary | ICD-10-CM | POA: Diagnosis not present

## 2018-01-27 MED ORDER — PAROXETINE HCL 40 MG PO TABS: 40.0000 mg | ORAL_TABLET | Freq: Every day | ORAL | 5 refills | Status: DC

## 2018-01-27 MED ORDER — CLONAZEPAM 1 MG PO TABS: 1.0000 mg | ORAL_TABLET | Freq: Two times a day (BID) | ORAL | 2 refills | Status: DC | PRN

## 2018-01-27 NOTE — Progress Notes (Signed)
Subjective:    Patient ID: Katrina Sutton, female    DOB: 1950-09-02, 67 y.o.   MRN: [id number]  Katrina Sutton is a 67 y.o. female presenting on 01/27/2018 for Anxiety and Gastroesophageal Reflux   HPI Anxiety Takes clonazepam 1 mg twice daily for anxiety.  Continues Paxil without difficulty.  Has some ongoing difficulty sleeping.  She was placed on Trazodone after last visit, but had side effects so we stopped this medication after telephone call 11/09/2017.  Patient states is not severe enough to try other medications.  GAD 7 : Generalized Anxiety Score 01/27/2018 10/28/2017 08/23/2017 05/20/2017  Nervous, Anxious, on Edge 1 2 3  0  Control/stop worrying 0 0 0 0  Worry too much - different things 0 1 1 0  Trouble relaxing 0 3 1 0  Restless 1 0 0 0  Easily annoyed or irritable 1 3 1 1   Afraid - awful might happen 0 0 1 0  Total GAD 7 Score 3 9 7 1   Anxiety Difficulty Not difficult at all Not difficult at all Not difficult at all Somewhat difficult    Depression screen [facility] 2/9 01/27/2018 10/28/2017 08/23/2017 05/20/2017 04/22/2017  Decreased Interest 0 0 0 0 0  Down, Depressed, Hopeless 1 0 0 1 0  PHQ - 2 Score 1 0 0 1 0  Altered sleeping 1 1 1  0 0  Tired, decreased energy 0 3 1 1 2   Change in appetite 1 1 1  0 0  Feeling bad or failure about yourself  0 0 0 0 0  Trouble concentrating 0 0 0 0 0  Moving slowly or fidgety/restless 0 0 1 0 1  Suicidal thoughts 0 0 0 0 0  PHQ-9 Score 3 5 4 2 3   Difficult doing work/chores Not difficult at all Not difficult at all Not difficult at all Not difficult at all Not difficult at all    GERD Prilosec is not helping.  Is taking in am and evening.  When she has one swallow if water, feels like it gets stuck.  Has no heartburn with this small amount of water. - On Prilosec, is only having occasional heartburn.   - After supper regularly or other meals occasionally she has sensation that food is pressing up. - Feels like she gets full quickly.  "I try to  eat light." - Has occasional burning with abdominal wall hernia with higher acidity foods. - Has known esophageal stricture with "stretching" 4-5 times.  Also has known hiatal hernia.  Social History   Tobacco Use  . Smoking status: Former Smoker    Last attempt to quit: 12/27/1976    Years since quitting: 41.1  . Smokeless tobacco: Never Used  Substance Use Topics  . Alcohol use: No  . Drug use: No    Review of Systems Per HPI unless specifically indicated above     Objective:    BP 133/82 (BP Location: Left Arm, Patient Position: Sitting, Cuff Size: Normal)   Pulse 100   Temp (!) 97.4 F (36.3 C) (Oral)   Ht 5' 4.5" (1.638 m)   Wt 165 lb 9.6 oz (75.1 kg)   BMI 27.99 kg/m   Wt Readings from Last 3 Encounters:  01/27/18 165 lb 9.6 oz (75.1 kg)  10/28/17 164 lb (74.4 kg)  08/23/17 162 lb 9.6 oz (73.8 kg)    Physical Exam  Constitutional: She is oriented to person, place, and time. She appears well-developed and well-nourished. No distress.  HENT:  Head: Normocephalic and atraumatic.  Cardiovascular: Normal rate, regular rhythm, S1 normal, S2 normal, normal heart sounds and intact distal pulses.  Pulmonary/Chest: Effort normal and breath sounds normal. No respiratory distress.  Abdominal: Soft. She exhibits no distension. Bowel sounds are increased. There is no hepatosplenomegaly. There is no tenderness. There is no rigidity, no rebound, no guarding and no CVA tenderness. A hernia is present. Hernia confirmed positive in the ventral area (stable, not incarcerated or strangulated).  Neurological: She is alert and oriented to person, place, and time.  Skin: Skin is warm and dry. Capillary refill takes less than 2 seconds.  Psychiatric: She has a normal mood and affect. Her behavior is normal. Judgment and thought content normal.  Vitals reviewed.     Assessment & Plan:   Problem List Items Addressed This Visit      Other   Anxiety  Stable anxiety, bipolar disorder  and insomnia. All continue to be partially controlled with paroxetine 40 mg once daily and clonazepam 1 mg bid. PHQ and GAD 7 continue to support moderate anxiety and depresision.  Insomnia is 2/2 anxiety and affects sleep onset and maintenance. - Continue clonazepam and paroxetine without changes  - Continue non-pharm sleep management strategies. - Followup 3 months.   Relevant Medications   clonazePAM (KLONOPIN) 1 MG tablet (Start on 02/21/2018)   PARoxetine (PAXIL) 40 MG tablet   Bipolar 1 disorder (HCC)   Relevant Medications   clonazePAM (KLONOPIN) 1 MG tablet (Start on 02/21/2018)    Other Visit Diagnoses    Early satiety    -  Primary Patient with early satiety, some bolus sensation with swallowing liquids.  GERD not fully controlled on omeprazole 20 mg bid.  Known esophageal stricture and hiatal hernia, patient states last care was > 5 years ago.  [facility]  Plan: 1. [facility] 2. Encouraged patient to continue with small meals, PPI, and avoidance of known food triggers. 3. Likely will need repeat EGD.  Briefly discussed that there are surgical management options that can be discussed with GI.  Not forever solutions as hiatal hernia can recur despite repair.  Further details encouraged to be discussed with GI specialist. 4. Follow-up prn.   Relevant Orders   Ambulatory referral to Gastroenterology   Hiatal hernia     See early satiety above.   Relevant Orders   Ambulatory referral to Gastroenterology      Meds ordered this encounter  Medications  . clonazePAM (KLONOPIN) 1 MG tablet    Sig: Take 1 tablet (1 mg total) by mouth 2 (two) times daily as needed for anxiety.    Dispense:  60 tablet    Refill:  2    Order Specific Question:   Supervising Provider    Answer:   Smitty CordsKARAMALEGOS, ALEXANDER J [[id number]]  . PARoxetine (PAXIL) 40 MG tablet    Sig: Take 1 tablet (40 mg total) by  mouth daily.    Dispense:  30 tablet    Refill:  5    Order Specific Question:   Supervising Provider    Answer:   Smitty CordsKARAMALEGOS, ALEXANDER J [[id number]]   Follow up plan: Return in about 3 months (around 04/29/2018) for anxiety.  Wilhelmina McardleLauren Nicle Connole, DNP, AGPCNP-BC Adult Gerontology Primary Care Nurse Practitioner [facility] 01/27/2018, 11:38 AM

## 2018-01-27 NOTE — Patient Instructions (Addendum)
Katrina Sutton,   Thank you for coming in to clinic today.  1. Continue clonazepam and Paroxetine.  - Try to cut back at least 1/2 tablet 2 times per week on your Clonazepam.  2. For your stomach, We will send you to GI - Dr. Mechele CollinElliott or other provider [facility].  They will call you to schedule this appointment.  Please schedule a follow-up appointment with Katrina Sutton, AGNP. Return in about 3 months (around 04/29/2018) for anxiety.  If you have any other questions or concerns, please feel free to call the clinic or send a message through MyChart. You may also schedule an earlier appointment if necessary.  You will receive a survey after today's visit either digitally by e-mail or paper by [employer] mail. Your experiences and feedback matter to us.  Please respond so we know how we are doing as we provide care for you.   Katrina McardleLauren Riggs Dineen, DNP, AGNP-BC Adult Gerontology Nurse Practitioner [facility], [facility]

## 2018-03-08 ENCOUNTER — Emergency Department
Admission: EM | Admit: 2018-03-08 | Discharge: 2018-03-09 | Disposition: A | Discharge status: Home / Self Care | Payer: Medicare HMO | Attending: Emergency Medicine | Admitting: Emergency Medicine

## 2018-03-08 ENCOUNTER — Encounter: Payer: Self-pay | Admitting: *Deleted

## 2018-03-08 ENCOUNTER — Other Ambulatory Visit: Payer: Self-pay

## 2018-03-08 DIAGNOSIS — Z79899 Other long term (current) drug therapy: Secondary | ICD-10-CM | POA: Insufficient documentation

## 2018-03-08 DIAGNOSIS — K8020 Calculus of gallbladder without cholecystitis without obstruction: Secondary | ICD-10-CM | POA: Diagnosis not present

## 2018-03-08 DIAGNOSIS — K449 Diaphragmatic hernia without obstruction or gangrene: Secondary | ICD-10-CM | POA: Diagnosis not present

## 2018-03-08 DIAGNOSIS — R109 Unspecified abdominal pain: Secondary | ICD-10-CM | POA: Diagnosis present

## 2018-03-08 DIAGNOSIS — Z87891 Personal history of nicotine dependence: Secondary | ICD-10-CM | POA: Insufficient documentation

## 2018-03-08 LAB — COMPREHENSIVE METABOLIC PANEL
ALK PHOS: 99 U/L (ref 38–126)
ALT: 14 U/L (ref 0–44)
AST: 17 U/L (ref 15–41)
Albumin: 4.3 g/dL (ref 3.5–5.0)
Anion gap: 7 (ref 5–15)
BUN: 10 mg/dL (ref 8–23)
CO2: 27 mmol/L (ref 22–32)
Calcium: 9 mg/dL (ref 8.9–10.3)
Chloride: 105 mmol/L (ref 98–111)
Creatinine, Ser: 0.94 mg/dL (ref 0.44–1.00)
GFR calc non Af Amer: >60 mL/min (ref >60)
Glucose, Bld: 99 mg/dL (ref 70–99)
Potassium: 3.8 mmol/L (ref 3.5–5.1)
Sodium: 139 mmol/L (ref 135–145)
Total Bilirubin: 0.8 mg/dL (ref 0.3–1.2)
Total Protein: 7.1 g/dL (ref 6.5–8.1)

## 2018-03-08 LAB — CBC
HCT: 43.7 % (ref 36.0–46.0)
Hemoglobin: 14.6 g/dL (ref 12.0–15.0)
MCH: 29.9 pg (ref 26.0–34.0)
MCHC: 33.4 g/dL (ref 30.0–36.0)
MCV: 89.5 fL (ref 80.0–100.0)
NRBC: 0 % (ref 0.0–0.2)
Platelets: 271 10*3/uL (ref 150–400)
RBC: 4.88 MIL/uL (ref 3.87–5.11)
RDW: 13 % (ref 11.5–15.5)
WBC: 8.7 10*3/uL (ref 4.0–10.5)

## 2018-03-08 LAB — LIPASE, BLOOD: Lipase: 78 U/L — ABNORMAL HIGH (ref 11–51)

## 2018-03-08 NOTE — ED Triage Notes (Signed)
Pt to ED with increased abd pain and a hernia that has been increasing in size. Pt reports hx of constipation but is currently have normal BM. No NV.

## 2018-03-09 ENCOUNTER — Emergency Department: Payer: Medicare HMO

## 2018-03-09 DIAGNOSIS — K449 Diaphragmatic hernia without obstruction or gangrene: Secondary | ICD-10-CM | POA: Diagnosis not present

## 2018-03-09 LAB — URINALYSIS, COMPLETE (UACMP) WITH MICROSCOPIC
Bacteria, UA: NONE SEEN
Bilirubin Urine: NEGATIVE
Glucose, UA: NEGATIVE mg/dL
HGB URINE DIPSTICK: NEGATIVE
Ketones, ur: NEGATIVE mg/dL
NITRITE: NEGATIVE
PROTEIN: NEGATIVE mg/dL
SPECIFIC GRAVITY, URINE: 1.008 (ref 1.005–1.030)
pH: 7 (ref 5.0–8.0)

## 2018-03-09 MED ORDER — IOPAMIDOL (ISOVUE-300) INJECTION 61%
100.0000 mL | Freq: Once | INTRAVENOUS | Status: AC | PRN
  Administered 2018-03-09: 100 mL via INTRAVENOUS

## 2018-03-09 NOTE — ED Notes (Signed)
Pt coughing during SpO2 monitoring.

## 2018-03-09 NOTE — ED Notes (Signed)
Pt leaving for CT.  

## 2018-03-09 NOTE — ED Provider Notes (Signed)
[facility] Emergency Department Provider Note    First MD Initiated Contact with Patient 03/09/18 0023     (approximate)  I have reviewed the triage vital signs and the nursing notes.   HISTORY  Chief Complaint Abdominal Pain and Hernia   HPI Katrina Sutton is a 67 y.o. female with below list of chronic medical conditions including hiatal and umbilical hernia presents to the emergency department with increased abdominal discomfort today bulging at the site of the umbilicus per the patient which is since resolved.  States that she is noticed increased bulging whenever she is constipated in the umbilicus.  Patient denies any chest pain no shortness of breath.  Patient states that abdominal discomfort has resolved at this time.  Patient denies any nausea vomiting or diarrhea.  Patient denies any fever.   Past Medical History:  Diagnosis Date  . Allergy    seasonal  . Anxiety   . Arthritis    osteoarthritis  . Depression   . GERD (gastroesophageal reflux disease)     Patient Active Problem List   Diagnosis Date Noted  . Bipolar 1 disorder (HCC) 08/23/2017  . GERD (gastroesophageal reflux disease) 12/27/2016  . Arthritis 12/27/2016  . Anxiety 12/27/2016  . Depression 12/27/2016  . Seasonal allergic rhinitis 12/27/2016    Past Surgical History:  Procedure Laterality Date  . TONSILLECTOMY    . WRIST SURGERY Left     Prior to Admission medications   Medication Sig Start Date End Date Taking? Authorizing Provider  acetaminophen (TYLENOL) 325 MG tablet Take 650 mg by mouth every 6 (six) hours as needed.    [provider]  clonazePAM (KLONOPIN) 1 MG tablet Take 1 tablet (1 mg total) by mouth 2 (two) times daily as needed for anxiety. 02/21/18 05/22/18  Galen Manila, NP  ipratropium (ATROVENT) 0.06 % nasal spray Place 2 sprays into both nostrils 4 (four) times daily for 5 days. 07/28/17 08/02/17  Galen Manila, NP  omeprazole  (PRILOSEC) 20 MG capsule Take 1 capsule (20 mg total) by mouth 2 (two) times daily before a meal. 07/28/17   Galen Manila, NP  PARoxetine (PAXIL) 40 MG tablet Take 1 tablet (40 mg total) by mouth daily. 01/27/18   Galen Manila, NP  polyethylene glycol [facility]) packet Take 1/2 dose once daily as needed for no bowel movement in 3 days. 10/28/17   Galen Manila, NP  Probiotic Product (PROBIOTIC-10) CAPS Take by mouth.    [provider]  traZODone (DESYREL) 50 MG tablet Take 0.5 tablets (25 mg total) by mouth at bedtime for 14 days, THEN 1 tablet (50 mg total) at bedtime. 10/28/17 01/26/18  Galen Manila, NP    Allergies No known allergies and Duloxetine  Family History  Problem Relation Age of Onset  . Mental illness Mother   . Ulcers Father   . Stroke Neg Hx   . Heart attack Neg Hx   . Cancer Neg Hx     Social History Social History   Tobacco Use  . Smoking status: Former Smoker    Last attempt to quit: 12/27/1976    Years since quitting: 41.2  . Smokeless tobacco: Never Used  Substance Use Topics  . Alcohol use: No  . Drug use: No    Review of Systems Constitutional: No fever/chills Eyes: No visual changes. ENT: No sore throat. Cardiovascular: Denies chest pain. Respiratory: Denies shortness of breath. Gastrointestinal: Positive for abdominal pain no nausea,  no vomiting.  No diarrhea.  No constipation. Genitourinary: Negative for dysuria. Musculoskeletal: Negative for neck pain.  Negative for back pain. Integumentary: Negative for rash. Neurological: Negative for headaches, focal weakness or numbness.   ____________________________________________   PHYSICAL EXAM:  VITAL SIGNS: ED Triage Vitals [03/08/18 1854]  Enc Vitals Group     BP (!) 158/82     Pulse Rate 82     Resp 16     Temp 97.6 F (36.4 C)     Temp Source Oral     SpO2 95 %     Weight 74.8 kg (165 lb)     Height 1.651 m (5\' 5" )     Head Circumference       Peak Flow      Pain Score 7     Pain Loc      Pain Edu?      Excl. in GC?     Constitutional: Alert and oriented. Well appearing and in no acute distress. Eyes: Conjunctivae are normal.  Mouth/Throat: Mucous membranes are moist. Oropharynx non-erythematous. Neck: No stridor.   Cardiovascular: Normal rate, regular rhythm. Good peripheral circulation. Grossly normal heart sounds. Respiratory: Normal respiratory effort.  No retractions. Lungs CTAB. Gastrointestinal: Soft and nontender. No distention.  Musculoskeletal: No lower extremity tenderness nor edema. No gross deformities of extremities. Neurologic:  Normal speech and language. No gross focal neurologic deficits are appreciated.  Skin:  Skin is warm, dry and intact. No rash noted. Psychiatric: Mood and affect are normal. Speech and behavior are normal.  ____________________________________________   LABS (all labs ordered are listed, but only abnormal results are displayed)  Labs Reviewed  LIPASE, BLOOD - Abnormal; Notable for the following components:      Result Value   Lipase 78 (*)    All other components within normal limits  URINALYSIS, COMPLETE (UACMP) WITH MICROSCOPIC - Abnormal; Notable for the following components:   Color, Urine YELLOW (*)    APPearance CLEAR (*)    Leukocytes, UA MODERATE (*)    All other components within normal limits  COMPREHENSIVE METABOLIC PANEL  CBC     RADIOLOGY I, Bennett N Mannie Ohlin, personally viewed and evaluated these images (plain radiographs) as part of my medical decision making, as well as reviewing the written report by the radiologist.  ED MD interpretation: Very large paraesophageal hernia containing the entirety of the stomach in the distal transverse colon without evidence of obstruction as well as cholelithiasis noted on CT abdomen and pelvis per radiologist.  Official radiology report(s): Ct Abdomen Pelvis W Contrast  Result Date: 03/09/2018 CLINICAL DATA:  Acute  onset of worsening generalized abdominal pain and known paraesophageal hernia. EXAM: CT ABDOMEN AND PELVIS WITH CONTRAST TECHNIQUE: Multidetector CT imaging of the abdomen and pelvis was performed using the standard protocol following bolus administration of intravenous contrast. CONTRAST:  100mL ISOVUE-300 IOPAMIDOL (ISOVUE-300) INJECTION 61% COMPARISON:  None. FINDINGS: Lower chest: There is chronic collapse of the right lower lung lobe. The visualized portions of the mediastinum are unremarkable. Incidental note is made of a retroesophageal right subclavian artery. Hepatobiliary: A 0.9 cm hypodensity is noted at the right hepatic lobe. The liver is otherwise unremarkable. Stones are noted filling the gallbladder. The gallbladder is otherwise unremarkable. The common bile duct is borderline normal in caliber. Pancreas: The pancreas is within normal limits. Spleen: The spleen is unremarkable in appearance. Adrenals/Urinary Tract: The adrenal glands are unremarkable in appearance. Bilateral renal scarring is noted, more prominent on the right. There  is no evidence of hydronephrosis. No renal or ureteral stones are identified. No perinephric stranding is seen. Stomach/Bowel: A very large paraesophageal hernia is noted, containing the entirety of the stomach and the distal transverse colon. There is no evidence for bowel obstruction at this time. The small bowel is grossly unremarkable in appearance. The appendix is normal in caliber, without evidence of appendicitis. The remaining colon is grossly unremarkable in appearance. Vascular/Lymphatic: Minimal calcification is seen along the abdominal aorta and its branches. The inferior vena cava is grossly unremarkable. No retroperitoneal or pelvic sidewall lymphadenopathy is seen. Reproductive: The bladder is mildly distended and grossly unremarkable. The uterus is grossly unremarkable in appearance. The ovaries are relatively symmetric. No suspicious adnexal masses are  seen. Other: A small umbilical hernia is noted, with mild underlying chronic inflammation. Musculoskeletal: No acute osseous abnormalities are identified. The visualized musculature is unremarkable in appearance. IMPRESSION: 1. Very large paraesophageal hernia, containing the entirety of the stomach and the distal transverse colon. No evidence for bowel obstruction at this time. 2. Chronic collapse of the right lower lung lobe. 3. Gallbladder filled with stones but otherwise unremarkable in appearance. 4. Bilateral renal scarring, more prominent on the right. 5. Small umbilical hernia, with mild underlying chronic inflammation. 6. Incidental note of a retroesophageal right subclavian artery. 7. Nonspecific 0.9 cm hypodensity at the right hepatic lobe, more likely benign. Would correlate with LFTs. Electronically Signed   By: Roanna RaiderJeffery  Chang M.D.   On: 03/09/2018 01:31      Procedures   ____________________________________________   INITIAL IMPRESSION / ASSESSMENT AND PLAN / ED COURSE  As part of my medical decision making, I reviewed the following data within the electronic MEDICAL RECORD NUMBER  67 year old female presenting with above-stated history and physical exam of abdominal pain which is since resolved.  CT scan revealed a very large paraesophageal hernia as well as cholelithiasis I spoke with the patient at length regarding warning signs that would warrant immediate return to the emergency department.  Patient referred to Dr. Everlene FarrierPabon general surgeon for further outpatient evaluation and management of both the very large paraesophageal hernia as well as cholelithiasis. ____________________________________________  FINAL CLINICAL IMPRESSION(S) / ED DIAGNOSES  Final diagnoses:  Paraesophageal hernia  Gallstones     MEDICATIONS GIVEN DURING THIS VISIT:  Medications  iopamidol (ISOVUE-300) 61 % injection 100 mL (100 mLs Intravenous Contrast Given 03/09/18 0057)     ED Discharge Orders      None       Note:  This document was prepared using Dragon voice recognition software and may include unintentional dictation errors.    Darci CurrentBrown, Hendley N, MD 03/09/18 2217

## 2018-03-09 NOTE — ED Notes (Signed)
Pt verbalized understanding of d/c instructions and f/u care. No further questions at this time. Pt ambulatory with steady gait to exit.

## 2018-03-09 NOTE — ED Notes (Signed)
Pt given warm blankets.

## 2018-03-09 NOTE — ED Notes (Signed)
Report received from [place], RN

## 2018-03-10 ENCOUNTER — Other Ambulatory Visit: Payer: Self-pay

## 2018-03-10 ENCOUNTER — Emergency Department: Payer: Medicare HMO

## 2018-03-10 ENCOUNTER — Observation Stay
Admission: EM | Admit: 2018-03-10 | Discharge: 2018-03-11 | Discharge status: Against Medical Advice | Payer: Medicare HMO | Attending: Internal Medicine | Admitting: Internal Medicine

## 2018-03-10 DIAGNOSIS — K219 Gastro-esophageal reflux disease without esophagitis: Secondary | ICD-10-CM | POA: Diagnosis not present

## 2018-03-10 DIAGNOSIS — Z79899 Other long term (current) drug therapy: Secondary | ICD-10-CM | POA: Diagnosis not present

## 2018-03-10 DIAGNOSIS — K449 Diaphragmatic hernia without obstruction or gangrene: Secondary | ICD-10-CM | POA: Insufficient documentation

## 2018-03-10 DIAGNOSIS — R0902 Hypoxemia: Secondary | ICD-10-CM | POA: Diagnosis not present

## 2018-03-10 DIAGNOSIS — R0600 Dyspnea, unspecified: Secondary | ICD-10-CM | POA: Diagnosis not present

## 2018-03-10 DIAGNOSIS — J9811 Atelectasis: Secondary | ICD-10-CM | POA: Insufficient documentation

## 2018-03-10 DIAGNOSIS — J449 Chronic obstructive pulmonary disease, unspecified: Secondary | ICD-10-CM | POA: Diagnosis not present

## 2018-03-10 DIAGNOSIS — J9601 Acute respiratory failure with hypoxia: Secondary | ICD-10-CM

## 2018-03-10 DIAGNOSIS — R110 Nausea: Secondary | ICD-10-CM | POA: Diagnosis not present

## 2018-03-10 DIAGNOSIS — R000 Tachycardia, unspecified: Secondary | ICD-10-CM | POA: Diagnosis not present

## 2018-03-10 DIAGNOSIS — R52 Pain, unspecified: Secondary | ICD-10-CM | POA: Diagnosis not present

## 2018-03-10 DIAGNOSIS — Z87891 Personal history of nicotine dependence: Secondary | ICD-10-CM | POA: Insufficient documentation

## 2018-03-10 DIAGNOSIS — F329 Major depressive disorder, single episode, unspecified: Secondary | ICD-10-CM | POA: Diagnosis not present

## 2018-03-10 DIAGNOSIS — F419 Anxiety disorder, unspecified: Secondary | ICD-10-CM | POA: Insufficient documentation

## 2018-03-10 DIAGNOSIS — R0602 Shortness of breath: Secondary | ICD-10-CM | POA: Diagnosis not present

## 2018-03-10 DIAGNOSIS — R9431 Abnormal electrocardiogram [ECG] [EKG]: Secondary | ICD-10-CM | POA: Insufficient documentation

## 2018-03-10 DIAGNOSIS — R1084 Generalized abdominal pain: Secondary | ICD-10-CM | POA: Diagnosis not present

## 2018-03-10 DIAGNOSIS — M1990 Unspecified osteoarthritis, unspecified site: Secondary | ICD-10-CM | POA: Diagnosis not present

## 2018-03-10 DIAGNOSIS — R1111 Vomiting without nausea: Secondary | ICD-10-CM | POA: Diagnosis not present

## 2018-03-10 LAB — URINALYSIS, COMPLETE (UACMP) WITH MICROSCOPIC
BILIRUBIN URINE: NEGATIVE
Bacteria, UA: NONE SEEN
Glucose, UA: NEGATIVE mg/dL
HGB URINE DIPSTICK: NEGATIVE
KETONES UR: NEGATIVE mg/dL
Leukocytes, UA: NEGATIVE
NITRITE: NEGATIVE
Protein, ur: NEGATIVE mg/dL
Specific Gravity, Urine: 1.003 — ABNORMAL LOW (ref 1.005–1.030)
Squamous Epithelial / HPF: NONE SEEN (ref 0–5)
pH: 7 (ref 5.0–8.0)

## 2018-03-10 LAB — COMPREHENSIVE METABOLIC PANEL
ALT: 15 U/L (ref 0–44)
AST: 26 U/L (ref 15–41)
Albumin: 4 g/dL (ref 3.5–5.0)
Alkaline Phosphatase: 100 U/L (ref 38–126)
Anion gap: 10 (ref 5–15)
BUN: 7 mg/dL — ABNORMAL LOW (ref 8–23)
CO2: 23 mmol/L (ref 22–32)
Calcium: 9 mg/dL (ref 8.9–10.3)
Chloride: 104 mmol/L (ref 98–111)
Creatinine, Ser: 0.82 mg/dL (ref 0.44–1.00)
GFR calc Af Amer: >60 mL/min (ref >60)
GFR calc non Af Amer: >60 mL/min (ref >60)
Glucose, Bld: 113 mg/dL — ABNORMAL HIGH (ref 70–99)
Potassium: 3.7 mmol/L (ref 3.5–5.1)
Sodium: 137 mmol/L (ref 135–145)
Total Bilirubin: 0.9 mg/dL (ref 0.3–1.2)
Total Protein: 6.8 g/dL (ref 6.5–8.1)

## 2018-03-10 LAB — TROPONIN I: Troponin I: 0.03 ng/mL (ref <0.03)

## 2018-03-10 LAB — INFLUENZA PANEL BY PCR (TYPE A & B)
Influenza A By PCR: NEGATIVE
Influenza B By PCR: NEGATIVE

## 2018-03-10 MED ORDER — IOPAMIDOL (ISOVUE-370) INJECTION 76%
75.0000 mL | Freq: Once | INTRAVENOUS | Status: AC | PRN
  Administered 2018-03-10: 75 mL via INTRAVENOUS

## 2018-03-10 NOTE — ED Provider Notes (Addendum)
[facility] Emergency Department Provider Note    First MD Initiated Contact with Patient 03/10/18 2157     (approximate)  I have reviewed the triage vital signs and the nursing notes.   HISTORY  Chief Complaint Shortness of Breath and Abdominal Pain    HPI Katrina Sutton is a 67 y.o. female presents the ER with chief complaint of shortness of breath.  Is not having to complain of abdominal pain like she was the other day.  Today is primarily shortness of breath.  Has had nonproductive cough.  Denies any chest pain.  No measured fevers at home.  Does arrive with hypoxia on room air mild tachypnea requiring supplemental oxygen.    Past Medical History:  Diagnosis Date  . Allergy    seasonal  . Anxiety   . Arthritis    osteoarthritis  . Depression   . GERD (gastroesophageal reflux disease)    Family History  Problem Relation Age of Onset  . Mental illness Mother   . Ulcers Father   . Stroke Neg Hx   . Heart attack Neg Hx   . Cancer Neg Hx    Past Surgical History:  Procedure Laterality Date  . TONSILLECTOMY    . WRIST SURGERY Left    Patient Active Problem List   Diagnosis Date Noted  . Bipolar 1 disorder (HCC) 08/23/2017  . GERD (gastroesophageal reflux disease) 12/27/2016  . Arthritis 12/27/2016  . Anxiety 12/27/2016  . Depression 12/27/2016  . Seasonal allergic rhinitis 12/27/2016      Prior to Admission medications   Medication Sig Start Date End Date Taking? Authorizing Provider  acetaminophen (TYLENOL) 325 MG tablet Take 650 mg by mouth every 6 (six) hours as needed.    [provider]  clonazePAM (KLONOPIN) 1 MG tablet Take 1 tablet (1 mg total) by mouth 2 (two) times daily as needed for anxiety. 02/21/18 05/22/18  Galen ManilaKennedy, Lauren Renee, NP  ipratropium (ATROVENT) 0.06 % nasal spray Place 2 sprays into both nostrils 4 (four) times daily for 5 days. 07/28/17 08/02/17  Galen ManilaKennedy, Lauren Renee, NP  omeprazole (PRILOSEC) 20 MG  capsule Take 1 capsule (20 mg total) by mouth 2 (two) times daily before a meal. 07/28/17   Galen ManilaKennedy, Lauren Renee, NP  PARoxetine (PAXIL) 40 MG tablet Take 1 tablet (40 mg total) by mouth daily. 01/27/18   Galen ManilaKennedy, Lauren Renee, NP  polyethylene glycol [facility](MIRALAX) packet Take 1/2 dose once daily as needed for no bowel movement in 3 days. 10/28/17   Galen ManilaKennedy, Lauren Renee, NP  Probiotic Product (PROBIOTIC-10) CAPS Take by mouth.    [provider]  traZODone (DESYREL) 50 MG tablet Take 0.5 tablets (25 mg total) by mouth at bedtime for 14 days, THEN 1 tablet (50 mg total) at bedtime. 10/28/17 01/26/18  Galen ManilaKennedy, Lauren Renee, NP    Allergies No known allergies and Duloxetine    Social History Social History   Tobacco Use  . Smoking status: Former Smoker    Last attempt to quit: 12/27/1976    Years since quitting: 41.2  . Smokeless tobacco: Never Used  Substance Use Topics  . Alcohol use: No  . Drug use: No    Review of Systems Patient denies headaches, rhinorrhea, blurry vision, numbness, shortness of breath, chest pain, edema, cough, abdominal pain, nausea, vomiting, diarrhea, dysuria, fevers, rashes or hallucinations unless otherwise stated above in HPI. ____________________________________________   PHYSICAL EXAM:  VITAL SIGNS: Vitals:   03/10/18 2230 03/10/18 2300  BP: (!) 150/92 (!) 150/97  Pulse: 78 79  Resp: 16 16  Temp:    SpO2: 94% 95%    Constitutional: Alert and oriented.  Mild respiratory distress requiring supplemental oxygen. Eyes: Conjunctivae are normal.  Head: Atraumatic. Nose: No congestion/rhinnorhea. Mouth/Throat: Mucous membranes are moist.   Neck: No stridor. Painless ROM.  Cardiovascular: Normal rate, regular rhythm. Grossly normal heart sounds.  Good peripheral circulation. Respiratory: increased respiratory effort.  No retractions.  Gastrointestinal: Soft and nontender. No distention. No abdominal bruits. No CVA tenderness. Genitourinary:  deferred  Musculoskeletal: No lower extremity tenderness nor edema.  No joint effusions. Neurologic:  Normal speech and language. No gross focal neurologic deficits are appreciated. No facial droop Skin:  Skin is warm, dry and intact. No rash noted. Psychiatric: Mood and affect are normal. Speech and behavior are normal.  ____________________________________________   LABS (all labs ordered are listed, but only abnormal results are displayed)  Results for orders placed or performed during the hospital encounter of 03/10/18 (from the past 24 hour(s))  Comprehensive metabolic panel     Status: Abnormal   Collection Time: 03/10/18 10:10 PM  Result Value Ref Range   Sodium 137 135 - 145 mmol/L   Potassium 3.7 3.5 - 5.1 mmol/L   Chloride 104 98 - 111 mmol/L   CO2 23 22 - 32 mmol/L   Glucose, Bld 113 (H) 70 - 99 mg/dL   BUN 7 (L) 8 - 23 mg/dL   Creatinine, Ser 4.030.82 0.44 - 1.00 mg/dL   Calcium 9.0 8.9 - 47.410.3 mg/dL   Total Protein 6.8 6.5 - 8.1 g/dL   Albumin 4.0 3.5 - 5.0 g/dL   AST 26 15 - 41 U/L   ALT 15 0 - 44 U/L   Alkaline Phosphatase 100 38 - 126 U/L   Total Bilirubin 0.9 0.3 - 1.2 mg/dL   GFR calc non Af Amer >60 >60 mL/min   GFR calc Af Amer >60 >60 mL/min   Anion gap 10 5 - 15  CBC with Differential     Status: Abnormal   Collection Time: 03/10/18 10:10 PM  Result Value Ref Range   WBC 7.1 4.0 - 10.5 K/uL   RBC 4.89 3.87 - 5.11 MIL/uL   Hemoglobin 14.6 12.0 - 15.0 g/dL   HCT 25.944.1 56.336.0 - 87.546.0 %   MCV 90.2 80.0 - 100.0 fL   MCH 29.9 26.0 - 34.0 pg   MCHC 33.1 30.0 - 36.0 g/dL   RDW 64.312.8 32.911.5 - 51.815.5 %   Platelets 54 (L) 150 - 400 K/uL   nRBC 0.0 0.0 - 0.2 %   Neutrophils Relative % 83 %   Neutro Abs 5.9 1.7 - 7.7 K/uL   Lymphocytes Relative 7 %   Lymphs Abs 0.5 (L) 0.7 - 4.0 K/uL   Monocytes Relative 10 %   Monocytes Absolute 0.7 0.1 - 1.0 K/uL   Eosinophils Relative 0 %   Eosinophils Absolute 0.0 0.0 - 0.5 K/uL   Basophils Relative 0 %   Basophils Absolute 0.0  0.0 - 0.1 K/uL   Immature Granulocytes 0 %   Abs Immature Granulocytes 0.01 0.00 - 0.07 K/uL  Urinalysis, Complete w Microscopic     Status: Abnormal   Collection Time: 03/10/18 10:10 PM  Result Value Ref Range   Color, Urine STRAW (A) YELLOW   APPearance CLEAR (A) CLEAR   Specific Gravity, Urine 1.003 (L) 1.005 - 1.030   pH 7.0 5.0 - 8.0   Glucose, UA  NEGATIVE NEGATIVE mg/dL   Hgb urine dipstick NEGATIVE NEGATIVE   Bilirubin Urine NEGATIVE NEGATIVE   Ketones, ur NEGATIVE NEGATIVE mg/dL   Protein, ur NEGATIVE NEGATIVE mg/dL   Nitrite NEGATIVE NEGATIVE   Leukocytes, UA NEGATIVE NEGATIVE   WBC, UA 0-5 0 - 5 WBC/hpf   Bacteria, UA NONE SEEN NONE SEEN   Squamous Epithelial / LPF NONE SEEN 0 - 5  Troponin I - ONCE - STAT     Status: Abnormal   Collection Time: 03/10/18 10:10 PM  Result Value Ref Range   Troponin I 0.03 (HH) <0.03 ng/mL  Influenza panel by PCR (type A & B)     Status: None   Collection Time: 03/10/18 10:47 PM  Result Value Ref Range   Influenza A By PCR NEGATIVE NEGATIVE   Influenza B By PCR NEGATIVE NEGATIVE   ____________________________________________  EKG  My review and personal interpretation at Time:   21:59 Indication: sob  Rate: 115  Rhythm: sinus Axis: normal Other: normal bordeline qt, no stemi ____________________________________________  RADIOLOGY  I personally reviewed all radiographic images ordered to evaluate for the above acute complaints and reviewed radiology reports and findings.  These findings were personally discussed with the patient.  Please see medical record for radiology report.  ____________________________________________   PROCEDURES  Procedure(s) performed:  Procedures    Critical Care performed: no ____________________________________________   INITIAL IMPRESSION / ASSESSMENT AND PLAN / ED COURSE  Pertinent labs & imaging results that were available during my care of the patient were reviewed by me and considered  in my medical decision making (see chart for details).   DDX: Asthma, copd, CHF, pna, ptx, malignancy, Pe, anemia   Katrina Sutton is a 67 y.o. who presents to the ED with symptoms as described above.  Patient noted to have acute hypoxic respiratory failure with his new.  Denies any abdominal pain at this time.  She is otherwise afebrile but tachycardic.  Given her presentation will order CT angiogram of the chest to evaluate for PE.  Patient will require hospitalization.  Possible congestive heart failure no findings to suggest COPD but will trial neb.  The patient will be placed on continuous pulse oximetry and telemetry for monitoring.  Laboratory evaluation will be sent to evaluate for the above complaints.     Clinical Course as of Mar 11 6  Sat Mar 11, 2018  0006 Blood work does show new thrombocytopenia which will be confirmed with repeat platelet count.  We will also order path smear review and consultation with hospitalist.  Will hold off on antibiotics does not seem infectious at this time.  Patient will require hospitalization signed out to oncoming physician pending results.   [PR]    Clinical Course User Index [PR] Willy Eddy, MD     As part of my medical decision making, I reviewed the following data within the electronic MEDICAL RECORD NUMBER Nursing notes reviewed and incorporated, Labs reviewed, notes from prior ED visits.  ____________________________________________   FINAL CLINICAL IMPRESSION(S) / ED DIAGNOSES  Final diagnoses:  Acute respiratory failure with hypoxia (HCC)      NEW MEDICATIONS STARTED DURING THIS VISIT:  New Prescriptions   No medications on file     Note:  This document was prepared using Dragon voice recognition software and may include unintentional dictation errors.    Willy Eddy, MD 03/10/18 Joseph Pierini    Willy Eddy, MD 03/11/18 [phone]

## 2018-03-10 NOTE — ED Triage Notes (Signed)
Pt was here on 03/08/18 for cholelithiasis. Pt states she continues to have abd pan, but now also feels shob. Pt with ra pox of 87%, tachypnea noted, flushed skin noted. Pt states she has had 4 episodes of emesis today.

## 2018-03-11 DIAGNOSIS — F419 Anxiety disorder, unspecified: Secondary | ICD-10-CM | POA: Diagnosis not present

## 2018-03-11 DIAGNOSIS — R0600 Dyspnea, unspecified: Secondary | ICD-10-CM | POA: Diagnosis not present

## 2018-03-11 DIAGNOSIS — K449 Diaphragmatic hernia without obstruction or gangrene: Secondary | ICD-10-CM | POA: Diagnosis not present

## 2018-03-11 LAB — TECHNOLOGIST SMEAR REVIEW: Plt Morphology: NONE SEEN

## 2018-03-11 LAB — PLATELET COUNT: Platelets: 196 10*3/uL (ref 150–400)

## 2018-03-11 MED ORDER — DOCUSATE SODIUM 100 MG PO CAPS
100.0000 mg | ORAL_CAPSULE | Freq: Two times a day (BID) | ORAL | Status: DC
  Administered 2018-03-11: 100 mg via ORAL
  Filled 2018-03-11: qty 1

## 2018-03-11 MED ORDER — ONDANSETRON HCL 4 MG PO TABS: 4.0000 mg | ORAL_TABLET | Freq: Four times a day (QID) | ORAL | Status: DC | PRN

## 2018-03-11 MED ORDER — PAROXETINE HCL 20 MG PO TABS
40.0000 mg | ORAL_TABLET | Freq: Every day | ORAL | Status: DC
  Administered 2018-03-11: 40 mg via ORAL
  Filled 2018-03-11: qty 2

## 2018-03-11 MED ORDER — ACETAMINOPHEN 325 MG PO TABS: 650.0000 mg | ORAL_TABLET | Freq: Four times a day (QID) | ORAL | Status: DC | PRN

## 2018-03-11 MED ORDER — PANTOPRAZOLE SODIUM 40 MG PO TBEC
40.0000 mg | DELAYED_RELEASE_TABLET | Freq: Every day | ORAL | Status: DC
  Administered 2018-03-11: 40 mg via ORAL
  Filled 2018-03-11: qty 1

## 2018-03-11 MED ORDER — ONDANSETRON HCL 4 MG/2ML IJ SOLN: 4.0000 mg | Freq: Four times a day (QID) | INTRAMUSCULAR | Status: DC | PRN

## 2018-03-11 MED ORDER — CLONAZEPAM 1 MG PO TABS
1.0000 mg | ORAL_TABLET | Freq: Two times a day (BID) | ORAL | Status: DC | PRN
  Administered 2018-03-11: 1 mg via ORAL
  Filled 2018-03-11: qty 1

## 2018-03-11 MED ORDER — ACETAMINOPHEN 650 MG RE SUPP: 650.0000 mg | Freq: Four times a day (QID) | RECTAL | Status: DC | PRN

## 2018-03-11 MED ORDER — SODIUM CHLORIDE 0.9 % IV SOLN
INTRAVENOUS | Status: DC
  Administered 2018-03-11: 05:00:00 via INTRAVENOUS

## 2018-03-11 NOTE — Progress Notes (Signed)
Pt's SaO2 91% on 1L [place]. Pt refused to stay in hospital and signed AMA out.

## 2018-03-11 NOTE — ED Notes (Signed)
Pt up to BR with this RN with standby assist.

## 2018-03-11 NOTE — Progress Notes (Signed)
[facility] - [place] [facility]   PATIENT NAME: Katrina Sutton    MR#:  [id number]  DATE OF BIRTH:  03/21/1950  SUBJECTIVE:  CHIEF COMPLAINT:   Chief Complaint  Patient presents with  . Shortness of Breath  . Abdominal Pain  Patient seen and evaluated this morning On oxygen via nasal cannula She is hungry and wants to try diet  REVIEW OF SYSTEMS:    ROS  CONSTITUTIONAL: No documented fever. No fatigue, weakness. No weight gain, no weight loss.  EYES: No blurry or double vision.  ENT: No tinnitus. No postnasal drip. No redness of the oropharynx.  RESPIRATORY: No cough, no wheeze, no hemoptysis. No dyspnea.  CARDIOVASCULAR: No chest pain. No orthopnea. No palpitations. No syncope.  GASTROINTESTINAL: No nausea, no vomiting or diarrhea. No abdominal pain. No melena or hematochezia.  GENITOURINARY: No dysuria or hematuria.  ENDOCRINE: No polyuria or nocturia. No heat or cold intolerance.  HEMATOLOGY: No anemia. No bruising. No bleeding.  INTEGUMENTARY: No rashes. No lesions.  MUSCULOSKELETAL: No arthritis. No swelling. No gout.  NEUROLOGIC: No numbness, tingling, or ataxia. No seizure-type activity.  PSYCHIATRIC: No anxiety. No insomnia. No ADD.   DRUG ALLERGIES:   Allergies  Allergen Reactions  . Erythromycin Shortness Of Breath and Rash  . Duloxetine Palpitations    VITALS:  Blood pressure (!) 144/126, pulse (!) 115, temperature 98 F (36.7 C), temperature source Oral, resp. rate (!) 25, height 5\' 6"  (1.676 m), weight 72.7 kg, SpO2 94 %.  PHYSICAL EXAMINATION:   Physical Exam  GENERAL:  67 y.o.-year-old patient lying in the bed with no acute distress.  EYES: Pupils equal, round, reactive to light and accommodation. No scleral icterus. Extraocular muscles intact.  HEENT: Head atraumatic, normocephalic. Oropharynx and nasopharynx clear.  NECK:  Supple, no jugular venous distention. No thyroid enlargement, no tenderness.  LUNGS: Normal breath sounds  bilaterally, no wheezing, rales, rhonchi. No use of accessory muscles of respiration.  CARDIOVASCULAR: S1, S2 normal. No murmurs, rubs, or gallops.  ABDOMEN: Soft, nontender, nondistended. Bowel sounds present.  Hernia noted. EXTREMITIES: No cyanosis, clubbing or edema b/l.    NEUROLOGIC: Cranial nerves II through XII are intact. No focal Motor or sensory deficits b/l.   PSYCHIATRIC: The patient is alert and oriented x 3.  SKIN: No obvious rash, lesion, or ulcer.   LABORATORY PANEL:   CBC Recent Labs  Lab 03/10/18 2210 03/11/18 0137  WBC 7.1  --   HGB 14.6  --   HCT 44.1  --   PLT 54* 196   ------------------------------------------------------------------------------------------------------------------ Chemistries  Recent Labs  Lab 03/10/18 2210  NA 137  K 3.7  CL 104  CO2 23  GLUCOSE 113*  BUN 7*  CREATININE 0.82  CALCIUM 9.0  AST 26  ALT 15  ALKPHOS 100  BILITOT 0.9   ------------------------------------------------------------------------------------------------------------------  Cardiac Enzymes Recent Labs  Lab 03/10/18 2210  TROPONINI 0.03*   ------------------------------------------------------------------------------------------------------------------  RADIOLOGY:  Dg Chest 2 View  Result Date: 03/10/2018 CLINICAL DATA:  Acute onset of shortness of breath and generalized abdominal pain. Decreased O2 saturation. Tachypnea and vomiting. EXAM: CHEST - 2 VIEW COMPARISON:  Chest radiograph performed 08/30/2014 FINDINGS: A large right-sided paraesophageal hernia is again noted. Minimal left basilar atelectasis is noted. There is no evidence of pleural effusion or pneumothorax. The heart is normal in size; the mediastinal contour is within normal limits. No acute osseous abnormalities are seen. IMPRESSION: Large right-sided paraesophageal hernia again noted. Minimal left basilar atelectasis noted. Electronically  Signed   By: Roanna RaiderJeffery  Chang M.D.   On: 03/10/2018  22:25   Ct Angio Chest Pe W And/or Wo Contrast  Result Date: 03/10/2018 CLINICAL DATA:  Acute onset of generalized abdominal pain and shortness of breath. Decreased O2 saturation. Tachypnea. Vomiting. EXAM: CT ANGIOGRAPHY CHEST WITH CONTRAST TECHNIQUE: Multidetector CT imaging of the chest was performed using the standard protocol during bolus administration of intravenous contrast. Multiplanar CT image reconstructions and MIPs were obtained to evaluate the vascular anatomy. CONTRAST:  75mL ISOVUE-370 IOPAMIDOL (ISOVUE-370) INJECTION 76% COMPARISON:  Chest radiograph performed earlier today at 10:11 p.m. FINDINGS: Cardiovascular: There is no evidence of significant pulmonary embolus. The heart is normal in size. Mild calcification is noted at the aortic arch. The great vessels are unremarkable. Incidental note is made of a retroesophageal right subclavian artery. Mediastinum/Nodes: The mediastinum is grossly unremarkable in appearance. No mediastinal lymphadenopathy is seen. No pericardial effusion is identified. The thyroid gland is unremarkable. No axillary lymphadenopathy is appreciated. A very large right-sided paraesophageal hernia is again noted, containing the entirety of the stomach and part of the transverse colon. There is no evidence of bowel obstruction. Lungs/Pleura: Right basilar atelectasis is noted about the paraesophageal hernia. Mild nonspecific mosaic pattern of parenchymal attenuation is noted, which could reflect atelectasis, mild infection or interstitial edema. No pleural effusion or pneumothorax is seen. No masses are identified. Upper Abdomen: The visualized portions of the liver and spleen are unremarkable. The visualized portions of the pancreas are within normal limits. Musculoskeletal: No acute osseous abnormalities are identified. The visualized musculature is unremarkable in appearance. Review of the MIP images confirms the above findings. IMPRESSION: 1. No evidence of significant  pulmonary embolus. 2. Right basilar atelectasis noted about the paraesophageal hernia. Mild nonspecific mosaic pattern of parenchymal attenuation, which could reflect atelectasis, mild infection or interstitial edema. 3. Very large right-sided paraesophageal hernia again noted, containing the entirety of the stomach and part of the transverse colon. No evidence of bowel obstruction. Electronically Signed   By: Roanna RaiderJeffery  Chang M.D.   On: 03/10/2018 23:41     ASSESSMENT AND PLAN:   67 year old female patient with history of paraesophageal hernia, arthritis, GERD currently under hospitalist service for dyspnea  -Acute respiratory distress with hypoxia Probably secondary to paraesophageal hernia impeding respiratory movement Oxygen via nasal cannula  -Anxiety disorder Anxiolytics  -Paraesophageal hernia Patient has this from 1984 No acute indication for surgery Symptomatic management  -DVT prophylaxis sequential compression device to lower extremities  -GI prophylaxis with oral Protonix  All the records are reviewed and case discussed with Care Management/Social Worker. Management plans discussed with the patient, family and they are in agreement.  CODE STATUS: Full code  DVT Prophylaxis: SCDs  TOTAL TIME TAKING CARE OF THIS PATIENT: 45 minutes.   POSSIBLE D/C IN 1 DAYS, DEPENDING ON CLINICAL CONDITION.  Ihor AustinPavan Pyreddy M.D on 03/11/2018 at 3:52 PM  Between 7am to 6pm - Pager - [phone]  After 6pm go to [email or web address] - password [email or web address] [facility]  [facility] [place] [facility]  Office  [phone]  CC: Primary care physician; Galen ManilaKennedy, Lauren Renee, NP  Note: This dictation was prepared with Dragon dictation along with smaller phrase technology. Any transcriptional errors that result from this process are unintentional.

## 2018-03-11 NOTE — Progress Notes (Signed)
Patient admitted for observation. Presented with nausea, vomiting and shortness of breath. Found to have a large right-sided paraesophageal hernia containing the entirety of the stomach and part of the transverse Colon. CT negative for PE. Dyspnea deemed to be due to diaphragmatic compression from the hernia. Currently asymptomatic and doing well on nasal canula. Will continue to monitor

## 2018-03-11 NOTE — Progress Notes (Signed)
eLink Physician-Brief Progress Note Patient Name: Katrina Sutton DOB: 11/10/1950 MRN: [id number]   Date of Service  03/11/2018  HPI/Events of Note  67 yo female with PMH of COPD and Asthma. Presents with SOB. Very limited information. No H&P in Epic at this time. Now admitted to stepdown bed. PCCM asked to assume care. VSS  eICU Interventions  No new orders.      Intervention Category Evaluation Type: New Patient Evaluation  Lenell AntuSommer,Jermain Curt Eugene 03/11/2018, 4:57 AM

## 2018-03-11 NOTE — Progress Notes (Signed)
Advanced care plan.  Purpose of the Encounter: CODE STATUS  Parties in Attendance: Patient  Patient's Decision Capacity: Good  Subjective/Patient's story: Presented to the emergency room for shortness of breath   Objective/Medical story Patient has dyspnea secondary to paraesophageal hernia Needs oxygen via nasal cannula No acute indication for surgery Has anxiety component   Goals of care determination:  Advance care directives goals of care and treatment plan discussed Patient wanted everything done in this hospitalization which includes CPR, intubation if the need arises   CODE STATUS: Full code   Time spent discussing advanced care planning: 16 minutes

## 2018-03-11 NOTE — H&P (Signed)
Katrina Sutton is an 67 y.o. female.   Chief Complaint: Shortness of breath HPI: The patient with past medical history of GERD as well as paraesophageal hernia presents to the emergency department complaining of shortness of breath.  The patient reports that her dyspnea began this afternoon.  Upon arrival to the emergency department she was found to have tachypnea as well as hypoxia to approximately 89% on pulse oximetry.  She was placed on oxygen via nasal cannula.  CT of the abdomen showed stable paraesophageal hernia.  The patient's abdominal pain waxes and wanes but essentially is no greater then her baseline.  Once the patient's respiratory rate improved the emergency department staff called the hospital service for admission.  Past Medical History:  Diagnosis Date  . Allergy    seasonal  . Anxiety   . Arthritis    osteoarthritis  . Depression   . GERD (gastroesophageal reflux disease)     Past Surgical History:  Procedure Laterality Date  . TONSILLECTOMY    . WRIST SURGERY Left     Family History  Problem Relation Age of Onset  . Mental illness Mother   . Ulcers Father   . Stroke Neg Hx   . Heart attack Neg Hx   . Cancer Neg Hx    Social History:  reports that she quit smoking about 41 years ago. She has never used smokeless tobacco. She reports that she does not drink alcohol or use drugs.  Allergies:  Allergies  Allergen Reactions  . Erythromycin Shortness Of Breath and Rash  . Duloxetine Palpitations    Medications Prior to Admission  Medication Sig Dispense Refill  . clonazePAM (KLONOPIN) 1 MG tablet Take 1 tablet (1 mg total) by mouth 2 (two) times daily as needed for anxiety. 60 tablet 2  . omeprazole (PRILOSEC) 20 MG capsule Take 1 capsule (20 mg total) by mouth 2 (two) times daily before a meal. 180 capsule 3  . PARoxetine (PAXIL) 40 MG tablet Take 1 tablet (40 mg total) by mouth daily. 30 tablet 5  . ipratropium (ATROVENT) 0.06 % nasal spray Place 2 sprays into  both nostrils 4 (four) times daily for 5 days. 15 mL 0  . traZODone (DESYREL) 50 MG tablet Take 0.5 tablets (25 mg total) by mouth at bedtime for 14 days, THEN 1 tablet (50 mg total) at bedtime. 83 tablet 0    Results for orders placed or performed during the hospital encounter of 03/10/18 (from the past 48 hour(s))  Comprehensive metabolic panel     Status: Abnormal   Collection Time: 03/10/18 10:10 PM  Result Value Ref Range   Sodium 137 135 - 145 mmol/L   Potassium 3.7 3.5 - 5.1 mmol/L    Comment: HEMOLYSIS AT THIS LEVEL MAY AFFECT RESULT   Chloride 104 98 - 111 mmol/L   CO2 23 22 - 32 mmol/L   Glucose, Bld 113 (H) 70 - 99 mg/dL   BUN 7 (L) 8 - 23 mg/dL   Creatinine, Ser 1.610.82 0.44 - 1.00 mg/dL   Calcium 9.0 8.9 - 09.610.3 mg/dL   Total Protein 6.8 6.5 - 8.1 g/dL   Albumin 4.0 3.5 - 5.0 g/dL   AST 26 15 - 41 U/L   ALT 15 0 - 44 U/L   Alkaline Phosphatase 100 38 - 126 U/L   Total Bilirubin 0.9 0.3 - 1.2 mg/dL   GFR calc non Af Amer >60 >60 mL/min   GFR calc Af Amer >60 >60 mL/min  Anion gap 10 5 - 15    Comment: Performed at [facility], [address] [place], [place] [address]  CBC with Differential     Status: Abnormal   Collection Time: 03/10/18 10:10 PM  Result Value Ref Range   WBC 7.1 4.0 - 10.5 K/uL   RBC 4.89 3.87 - 5.11 MIL/uL   Hemoglobin 14.6 12.0 - 15.0 g/dL   HCT 19.1 47.8 - 29.5 %   MCV 90.2 80.0 - 100.0 fL   MCH 29.9 26.0 - 34.0 pg   MCHC 33.1 30.0 - 36.0 g/dL   RDW 62.1 30.8 - 65.7 %   Platelets 54 (L) 150 - 400 K/uL    Comment: Immature Platelet Fraction may be clinically indicated, consider ordering this additional test [id number]    nRBC 0.0 0.0 - 0.2 %   Neutrophils Relative % 83 %   Neutro Abs 5.9 1.7 - 7.7 K/uL   Lymphocytes Relative 7 %   Lymphs Abs 0.5 (L) 0.7 - 4.0 K/uL   Monocytes Relative 10 %   Monocytes Absolute 0.7 0.1 - 1.0 K/uL   Eosinophils Relative 0 %   Eosinophils Absolute 0.0 0.0 - 0.5 K/uL   Basophils Relative 0 %    Basophils Absolute 0.0 0.0 - 0.1 K/uL   Immature Granulocytes 0 %   Abs Immature Granulocytes 0.01 0.00 - 0.07 K/uL    Comment: Performed at [facility], [address] [place], [place] [address]  Urinalysis, Complete w Microscopic     Status: Abnormal   Collection Time: 03/10/18 10:10 PM  Result Value Ref Range   Color, Urine STRAW (A) YELLOW   APPearance CLEAR (A) CLEAR   Specific Gravity, Urine 1.003 (L) 1.005 - 1.030   pH 7.0 5.0 - 8.0   Glucose, UA NEGATIVE NEGATIVE mg/dL   Hgb urine dipstick NEGATIVE NEGATIVE   Bilirubin Urine NEGATIVE NEGATIVE   Ketones, ur NEGATIVE NEGATIVE mg/dL   Protein, ur NEGATIVE NEGATIVE mg/dL   Nitrite NEGATIVE NEGATIVE   Leukocytes, UA NEGATIVE NEGATIVE   WBC, UA 0-5 0 - 5 WBC/hpf   Bacteria, UA NONE SEEN NONE SEEN   Squamous Epithelial / LPF NONE SEEN 0 - 5    Comment: Performed at [facility], [address] [place], [place] [address]  Troponin I - ONCE - STAT     Status: Abnormal   Collection Time: 03/10/18 10:10 PM  Result Value Ref Range   Troponin I 0.03 (HH) <0.03 ng/mL    Comment: CRITICAL RESULT CALLED TO, READ BACK BY AND VERIFIED WITH REBECCA LYNN ON 03/10/18 AT [address] [facility] Performed at [facility], [address], [place], [place] [address]   Influenza panel by PCR (type A & B)     Status: None   Collection Time: 03/10/18 10:47 PM  Result Value Ref Range   Influenza A By PCR NEGATIVE NEGATIVE   Influenza B By PCR NEGATIVE NEGATIVE    Comment: (NOTE) The Xpert Xpress Flu assay is intended as an aid in the diagnosis of  influenza and should not be used as a sole basis for treatment.  This  assay is FDA approved for nasopharyngeal swab specimens only. Nasal  washings and aspirates are unacceptable for Xpert Xpress Flu testing. Performed at [facility], [address] [place], [place] [address]   Platelet count     Status: None   Collection Time: 03/11/18  1:37 AM  Result Value Ref Range    Platelets  196 150 - 400 K/uL    Comment: Performed at [facility], [address] [place], [place] [address]  Technologist smear review     Status: None   Collection Time: 03/11/18  1:37 AM  Result Value Ref Range   Tech Review NO SCHISTOCYTE SEEN     Comment: Performed at [facility], [address] [place], [place] [address]   Dg Chest 2 View  Result Date: 03/10/2018 CLINICAL DATA:  Acute onset of shortness of breath and generalized abdominal pain. Decreased O2 saturation. Tachypnea and vomiting. EXAM: CHEST - 2 VIEW COMPARISON:  Chest radiograph performed 08/30/2014 FINDINGS: A large right-sided paraesophageal hernia is again noted. Minimal left basilar atelectasis is noted. There is no evidence of pleural effusion or pneumothorax. The heart is normal in size; the mediastinal contour is within normal limits. No acute osseous abnormalities are seen. IMPRESSION: Large right-sided paraesophageal hernia again noted. Minimal left basilar atelectasis noted. Electronically Signed   By: Roanna Raider M.D.   On: 03/10/2018 22:25   Ct Angio Chest Pe W And/or Wo Contrast  Result Date: 03/10/2018 CLINICAL DATA:  Acute onset of generalized abdominal pain and shortness of breath. Decreased O2 saturation. Tachypnea. Vomiting. EXAM: CT ANGIOGRAPHY CHEST WITH CONTRAST TECHNIQUE: Multidetector CT imaging of the chest was performed using the standard protocol during bolus administration of intravenous contrast. Multiplanar CT image reconstructions and MIPs were obtained to evaluate the vascular anatomy. CONTRAST:  75mL ISOVUE-370 IOPAMIDOL (ISOVUE-370) INJECTION 76% COMPARISON:  Chest radiograph performed earlier today at 10:11 p.m. FINDINGS: Cardiovascular: There is no evidence of significant pulmonary embolus. The heart is normal in size. Mild calcification is noted at the aortic arch. The great vessels are unremarkable. Incidental note is made of a retroesophageal right subclavian  artery. Mediastinum/Nodes: The mediastinum is grossly unremarkable in appearance. No mediastinal lymphadenopathy is seen. No pericardial effusion is identified. The thyroid gland is unremarkable. No axillary lymphadenopathy is appreciated. A very large right-sided paraesophageal hernia is again noted, containing the entirety of the stomach and part of the transverse colon. There is no evidence of bowel obstruction. Lungs/Pleura: Right basilar atelectasis is noted about the paraesophageal hernia. Mild nonspecific mosaic pattern of parenchymal attenuation is noted, which could reflect atelectasis, mild infection or interstitial edema. No pleural effusion or pneumothorax is seen. No masses are identified. Upper Abdomen: The visualized portions of the liver and spleen are unremarkable. The visualized portions of the pancreas are within normal limits. Musculoskeletal: No acute osseous abnormalities are identified. The visualized musculature is unremarkable in appearance. Review of the MIP images confirms the above findings. IMPRESSION: 1. No evidence of significant pulmonary embolus. 2. Right basilar atelectasis noted about the paraesophageal hernia. Mild nonspecific mosaic pattern of parenchymal attenuation, which could reflect atelectasis, mild infection or interstitial edema. 3. Very large right-sided paraesophageal hernia again noted, containing the entirety of the stomach and part of the transverse colon. No evidence of bowel obstruction. Electronically Signed   By: Roanna Raider M.D.   On: 03/10/2018 23:41    Review of Systems  Constitutional: Negative for chills and fever.  HENT: Negative for sore throat and tinnitus.   Eyes: Negative for blurred vision and redness.  Respiratory: Positive for shortness of breath. Negative for cough.   Cardiovascular: Negative for chest pain, palpitations, orthopnea and PND.  Gastrointestinal: Negative for abdominal pain, diarrhea, nausea and vomiting.  Genitourinary:  Negative for dysuria, frequency and urgency.  Musculoskeletal: Negative for joint pain and myalgias.  Skin: Negative for rash.  No lesions  Neurological: Negative for speech change, focal weakness and weakness.  Endo/Heme/Allergies: Does not bruise/bleed easily.       No temperature intolerance  Psychiatric/Behavioral: Negative for depression and suicidal ideas.    Blood pressure (!) 159/86, pulse 70, temperature 98.5 F (36.9 C), temperature source Oral, resp. rate (!) 21, height 5\' 6"  (1.676 m), weight 72.7 kg, SpO2 95 %. Physical Exam  Vitals reviewed. Constitutional: She is oriented to person, place, and time. She appears well-developed and well-nourished. No distress.  HENT:  Head: Normocephalic and atraumatic.  Mouth/Throat: Oropharynx is clear and moist.  Eyes: Pupils are equal, round, and reactive to light. Conjunctivae and EOM are normal. No scleral icterus.  Neck: Normal range of motion. Neck supple. No JVD present. No tracheal deviation present. No thyromegaly present.  Cardiovascular: Normal rate, regular rhythm and normal heart sounds. Exam reveals no gallop and no friction rub.  No murmur heard. Respiratory: Effort normal and breath sounds normal. No respiratory distress.  GI: Soft. Bowel sounds are normal. She exhibits no distension. There is no abdominal tenderness.  Genitourinary:    Genitourinary Comments: Deferred   Musculoskeletal: Normal range of motion.        General: No edema.  Lymphadenopathy:    She has no cervical adenopathy.  Neurological: She is alert and oriented to person, place, and time. No cranial nerve deficit. She exhibits normal muscle tone.  Skin: Skin is warm and dry. No rash noted. No erythema.  Psychiatric: She has a normal mood and affect. Her behavior is normal. Judgment and thought content normal.     Assessment/Plan This is a 67 year old female admitted for dyspnea. 1.  Dyspnea: Secondary to paraesophageal hernia.  The patient  does not have an acute indication for surgery.  She is currently on oxygen therapy and respiratory rate has improved.  Monitor respiratory status.  Arrange oxygen for home use if it will help in the future with her work of breathing. 2.  Para-esophageal hernia: Large; entirety of stomach as well as transverse colon within thorax.  Respiratory status will likely continue to be tenuous if hernia not repaired. 3.  Anxiety: Contributing to respiratory distress.  Continue Paxil and Klonopin per home regimen 4.  DVT prophylaxis: SCDs 5.  GI prophylaxis: Pantoprazole per home regimen The patient is a full code.  Time spent on admission orders and patient care approximately 45 minutes  Arnaldo Nataliamond,  Michael S, MD 03/11/2018, 8:35 AM

## 2018-03-11 NOTE — ED Notes (Signed)
ED TO INPATIENT HANDOFF REPORT  Name/Age/Gender Katrina Sutton 67 y.o. female  Code Status   Home/SNF/Other Home  Chief Complaint Shortness of breath  Level of Care/Admitting Diagnosis ED Disposition    ED Disposition Condition Comment   Admit  The patient appears reasonably stabilized for admission considering the current resources, flow, and capabilities available in the ED at this time, and I doubt any other [facility] requiring further screening and/or treatment in the ED prior to admission is  present.       Medical History Past Medical History:  Diagnosis Date  . Allergy    seasonal  . Anxiety   . Arthritis    osteoarthritis  . Depression   . GERD (gastroesophageal reflux disease)     Allergies Allergies  Allergen Reactions  . No Known Allergies   . Duloxetine Palpitations    IV Location/Drains/Wounds Patient Lines/Drains/Airways Status   Active Line/Drains/Airways    Name:   Placement date:   Placement time:   Site:   Days:   Peripheral IV 03/10/18 Left Forearm   03/10/18    2213    Forearm   1          Labs/Imaging Results for orders placed or performed during the hospital encounter of 03/10/18 (from the past 48 hour(s))  Comprehensive metabolic panel     Status: Abnormal   Collection Time: 03/10/18 10:10 PM  Result Value Ref Range   Sodium 137 135 - 145 mmol/L   Potassium 3.7 3.5 - 5.1 mmol/L    Comment: HEMOLYSIS AT THIS LEVEL MAY AFFECT RESULT   Chloride 104 98 - 111 mmol/L   CO2 23 22 - 32 mmol/L   Glucose, Bld 113 (H) 70 - 99 mg/dL   BUN 7 (L) 8 - 23 mg/dL   Creatinine, Ser 4.09 0.44 - 1.00 mg/dL   Calcium 9.0 8.9 - 81.1 mg/dL   Total Protein 6.8 6.5 - 8.1 g/dL   Albumin 4.0 3.5 - 5.0 g/dL   AST 26 15 - 41 U/L   ALT 15 0 - 44 U/L   Alkaline Phosphatase 100 38 - 126 U/L   Total Bilirubin 0.9 0.3 - 1.2 mg/dL   GFR calc non Af Amer >60 >60 mL/min   GFR calc Af Amer >60 >60 mL/min   Anion gap 10 5 - 15    Comment: Performed at [facility], [address] [place], [place] [address]  CBC with Differential     Status: Abnormal   Collection Time: 03/10/18 10:10 PM  Result Value Ref Range   WBC 7.1 4.0 - 10.5 K/uL   RBC 4.89 3.87 - 5.11 MIL/uL   Hemoglobin 14.6 12.0 - 15.0 g/dL   HCT 29.5 62.1 - 30.8 %   MCV 90.2 80.0 - 100.0 fL   MCH 29.9 26.0 - 34.0 pg   MCHC 33.1 30.0 - 36.0 g/dL   RDW 65.7 84.6 - 96.2 %   Platelets 54 (L) 150 - 400 K/uL    Comment: Immature Platelet Fraction may be clinically indicated, consider ordering this additional test [id number]    nRBC 0.0 0.0 - 0.2 %   Neutrophils Relative % 83 %   Neutro Abs 5.9 1.7 - 7.7 K/uL   Lymphocytes Relative 7 %   Lymphs Abs 0.5 (L) 0.7 - 4.0 K/uL   Monocytes Relative 10 %   Monocytes Absolute 0.7 0.1 - 1.0 K/uL   Eosinophils Relative 0 %   Eosinophils Absolute 0.0 0.0 -  0.5 K/uL   Basophils Relative 0 %   Basophils Absolute 0.0 0.0 - 0.1 K/uL   Immature Granulocytes 0 %   Abs Immature Granulocytes 0.01 0.00 - 0.07 K/uL    Comment: Performed at [facility], [address] [place], [place] [address]  Urinalysis, Complete w Microscopic     Status: Abnormal   Collection Time: 03/10/18 10:10 PM  Result Value Ref Range   Color, Urine STRAW (A) YELLOW   APPearance CLEAR (A) CLEAR   Specific Gravity, Urine 1.003 (L) 1.005 - 1.030   pH 7.0 5.0 - 8.0   Glucose, UA NEGATIVE NEGATIVE mg/dL   Hgb urine dipstick NEGATIVE NEGATIVE   Bilirubin Urine NEGATIVE NEGATIVE   Ketones, ur NEGATIVE NEGATIVE mg/dL   Protein, ur NEGATIVE NEGATIVE mg/dL   Nitrite NEGATIVE NEGATIVE   Leukocytes, UA NEGATIVE NEGATIVE   WBC, UA 0-5 0 - 5 WBC/hpf   Bacteria, UA NONE SEEN NONE SEEN   Squamous Epithelial / LPF NONE SEEN 0 - 5    Comment: Performed at [facility], [address] [place], [place] [address]  Troponin I - ONCE - STAT     Status: Abnormal   Collection Time: 03/10/18 10:10 PM  Result Value Ref Range   Troponin I 0.03 (HH) <0.03 ng/mL     Comment: CRITICAL RESULT CALLED TO, READ BACK BY AND VERIFIED WITH Elian Gloster ON 03/10/18 AT 2254 [facility] Performed at [facility], [address] [place], [place] [address]   Influenza panel by PCR (type A & B)     Status: None   Collection Time: 03/10/18 10:47 PM  Result Value Ref Range   Influenza A By PCR NEGATIVE NEGATIVE   Influenza B By PCR NEGATIVE NEGATIVE    Comment: (NOTE) The Xpert Xpress Flu assay is intended as an aid in the diagnosis of  influenza and should not be used as a sole basis for treatment.  This  assay is FDA approved for nasopharyngeal swab specimens only. Nasal  washings and aspirates are unacceptable for Xpert Xpress Flu testing. Performed at [facility], [address] [place], [place] [address]    Dg Chest 2 View  Result Date: 03/10/2018 CLINICAL DATA:  Acute onset of shortness of breath and generalized abdominal pain. Decreased O2 saturation. Tachypnea and vomiting. EXAM: CHEST - 2 VIEW COMPARISON:  Chest radiograph performed 08/30/2014 FINDINGS: A large right-sided paraesophageal hernia is again noted. Minimal left basilar atelectasis is noted. There is no evidence of pleural effusion or pneumothorax. The heart is normal in size; the mediastinal contour is within normal limits. No acute osseous abnormalities are seen. IMPRESSION: Large right-sided paraesophageal hernia again noted. Minimal left basilar atelectasis noted. Electronically Signed   By: Roanna RaiderJeffery  Chang M.D.   On: 03/10/2018 22:25   Ct Angio Chest Pe W And/or Wo Contrast  Result Date: 03/10/2018 CLINICAL DATA:  Acute onset of generalized abdominal pain and shortness of breath. Decreased O2 saturation. Tachypnea. Vomiting. EXAM: CT ANGIOGRAPHY CHEST WITH CONTRAST TECHNIQUE: Multidetector CT imaging of the chest was performed using the standard protocol during bolus administration of intravenous contrast. Multiplanar CT image reconstructions and MIPs were obtained to  evaluate the vascular anatomy. CONTRAST:  75mL ISOVUE-370 IOPAMIDOL (ISOVUE-370) INJECTION 76% COMPARISON:  Chest radiograph performed earlier today at 10:11 p.m. FINDINGS: Cardiovascular: There is no evidence of significant pulmonary embolus. The heart is normal in size. Mild calcification is noted at the aortic arch. The great vessels are unremarkable. Incidental note  is made of a retroesophageal right subclavian artery. Mediastinum/Nodes: The mediastinum is grossly unremarkable in appearance. No mediastinal lymphadenopathy is seen. No pericardial effusion is identified. The thyroid gland is unremarkable. No axillary lymphadenopathy is appreciated. A very large right-sided paraesophageal hernia is again noted, containing the entirety of the stomach and part of the transverse colon. There is no evidence of bowel obstruction. Lungs/Pleura: Right basilar atelectasis is noted about the paraesophageal hernia. Mild nonspecific mosaic pattern of parenchymal attenuation is noted, which could reflect atelectasis, mild infection or interstitial edema. No pleural effusion or pneumothorax is seen. No masses are identified. Upper Abdomen: The visualized portions of the liver and spleen are unremarkable. The visualized portions of the pancreas are within normal limits. Musculoskeletal: No acute osseous abnormalities are identified. The visualized musculature is unremarkable in appearance. Review of the MIP images confirms the above findings. IMPRESSION: 1. No evidence of significant pulmonary embolus. 2. Right basilar atelectasis noted about the paraesophageal hernia. Mild nonspecific mosaic pattern of parenchymal attenuation, which could reflect atelectasis, mild infection or interstitial edema. 3. Very large right-sided paraesophageal hernia again noted, containing the entirety of the stomach and part of the transverse colon. No evidence of bowel obstruction. Electronically Signed   By: Roanna Raider M.D.   On: 03/10/2018  23:41   Ct Abdomen Pelvis W Contrast  Result Date: 03/09/2018 CLINICAL DATA:  Acute onset of worsening generalized abdominal pain and known paraesophageal hernia. EXAM: CT ABDOMEN AND PELVIS WITH CONTRAST TECHNIQUE: Multidetector CT imaging of the abdomen and pelvis was performed using the standard protocol following bolus administration of intravenous contrast. CONTRAST:  ISOVUE-300 IOPAMIDOL (ISOVUE-300) INJECTION 61% COMPARISON:  None. FINDINGS: Lower chest: There is chronic collapse of the right lower lung lobe. The visualized portions of the mediastinum are unremarkable. Incidental note is made of a retroesophageal right subclavian artery. Hepatobiliary: A 0.9 cm hypodensity is noted at the right hepatic lobe. The liver is otherwise unremarkable. Stones are noted filling the gallbladder. The gallbladder is otherwise unremarkable. The common bile duct is borderline normal in caliber. Pancreas: The pancreas is within normal limits. Spleen: The spleen is unremarkable in appearance. Adrenals/Urinary Tract: The adrenal glands are unremarkable in appearance. Bilateral renal scarring is noted, more prominent on the right. There is no evidence of hydronephrosis. No renal or ureteral stones are identified. No perinephric stranding is seen. Stomach/Bowel: A very large paraesophageal hernia is noted, containing the entirety of the stomach and the distal transverse colon. There is no evidence for bowel obstruction at this time. The small bowel is grossly unremarkable in appearance. The appendix is normal in caliber, without evidence of appendicitis. The remaining colon is grossly unremarkable in appearance. Vascular/Lymphatic: Minimal calcification is seen along the abdominal aorta and its branches. The inferior vena cava is grossly unremarkable. No retroperitoneal or pelvic sidewall lymphadenopathy is seen. Reproductive: The bladder is mildly distended and grossly unremarkable. The uterus is grossly  unremarkable in appearance. The ovaries are relatively symmetric. No suspicious adnexal masses are seen. Other: A small umbilical hernia is noted, with mild underlying chronic inflammation. Musculoskeletal: No acute osseous abnormalities are identified. The visualized musculature is unremarkable in appearance. IMPRESSION: 1. Very large paraesophageal hernia, containing the entirety of the stomach and the distal transverse colon. No evidence for bowel obstruction at this time. 2. Chronic collapse of the right lower lung lobe. 3. Gallbladder filled with stones but otherwise unremarkable in appearance. 4. Bilateral renal scarring, more prominent on the right. 5. Small umbilical hernia, with mild underlying  chronic inflammation. 6. Incidental note of a retroesophageal right subclavian artery. 7. Nonspecific 0.9 cm hypodensity at the right hepatic lobe, more likely benign. Would correlate with LFTs. Electronically Signed   By: Roanna RaiderJeffery  Chang M.D.   On: 03/09/2018 01:31    Pending Labs Unresulted Labs (From admission, onward)    Start     Ordered   03/11/18 0005  Technologist smear review  Once,   STAT     03/11/18 0004   03/11/18 0004  Platelet count  ONCE - STAT,   STAT     03/11/18 0003   03/11/18 0004  Pathologist smear review  Once,   STAT     03/11/18 0004          Vitals/Pain Today's Vitals   03/10/18 2230 03/10/18 2300 03/10/18 2330 03/11/18 0000  BP: (!) 150/92 (!) 150/97 (!) 154/84 (!) 154/94  Pulse: 78 79 80 (!) 104  Resp: 16 16 16 20   Temp:      TempSrc:      SpO2: 94% 95% 96% 95%  Weight:      Height:      PainSc:        Isolation Precautions Droplet precaution  Medications Medications  iopamidol (ISOVUE-370) 76 % injection 75 mL (75 mLs Intravenous Contrast Given 03/10/18 2313)    Mobility walks

## 2018-03-11 NOTE — Discharge Summary (Signed)
Date of discharge 03/12/2018  Brief history of present illness and course of hospital stay The patient with past medical history of GERD as well as paraesophageal hernia presents to the emergency department complaining of shortness of breath.  The patient reports that her dyspnea began this afternoon.  Upon arrival to the emergency department she was found to have tachypnea as well as hypoxia to approximately 89% on pulse oximetry.  She was placed on oxygen via nasal cannula.  CT of the abdomen showed stable paraesophageal hernia.  The patient's abdominal pain waxes and wanes but essentially is no greater then her baseline.  Once the patient's respiratory rate improved the emergency department staff called the hospital service for admission.  Patient was admitted to medical floor.  She was put on oxygen via nasal cannula.  Initially she was kept n.p.o. but started on diet which she tolerated.  Her oxygen sats were around 85% on ambulation.  Has anxiety disorder.  Advised patient the need for hospitalization and continued oxygen via nasal cannula to improve her saturations.  Patient signed advised medical advice and left the hospital.  Also advised patient to follow-up with surgery for paraesophageal hernia.  I repeated a brief summary of the patient who signed AGAINST MEDICAL ADVICE and left the hospital.

## 2018-03-13 LAB — CBC WITH DIFFERENTIAL/PLATELET
Abs Immature Granulocytes: 0.01 10*3/uL (ref 0.00–0.07)
Basophils Absolute: 0 10*3/uL (ref 0.0–0.1)
Basophils Relative: 0 %
Eosinophils Absolute: 0 10*3/uL (ref 0.0–0.5)
Eosinophils Relative: 0 %
HCT: 44.1 % (ref 36.0–46.0)
Hemoglobin: 14.6 g/dL (ref 12.0–15.0)
Immature Granulocytes: 0 %
LYMPHS PCT: 7 %
Lymphs Abs: 0.5 10*3/uL — ABNORMAL LOW (ref 0.7–4.0)
MCH: 29.9 pg (ref 26.0–34.0)
MCHC: 33.1 g/dL (ref 30.0–36.0)
MCV: 90.2 fL (ref 80.0–100.0)
Monocytes Absolute: 0.7 10*3/uL (ref 0.1–1.0)
Monocytes Relative: 10 %
Neutro Abs: 5.9 10*3/uL (ref 1.7–7.7)
Neutrophils Relative %: 83 %
PLATELETS: 54 10*3/uL — AB (ref 150–400)
RBC: 4.89 MIL/uL (ref 3.87–5.11)
RDW: 12.8 % (ref 11.5–15.5)
WBC: 7.1 10*3/uL (ref 4.0–10.5)

## 2018-03-13 LAB — PATHOLOGIST SMEAR REVIEW

## 2018-03-19 ENCOUNTER — Emergency Department: Payer: Medicare HMO

## 2018-03-19 ENCOUNTER — Other Ambulatory Visit: Payer: Self-pay

## 2018-03-19 ENCOUNTER — Emergency Department
Admission: EM | Admit: 2018-03-19 | Discharge: 2018-03-19 | Disposition: A | Discharge status: Home / Self Care | Payer: Medicare HMO | Attending: Emergency Medicine | Admitting: Emergency Medicine

## 2018-03-19 DIAGNOSIS — Z79899 Other long term (current) drug therapy: Secondary | ICD-10-CM | POA: Diagnosis not present

## 2018-03-19 DIAGNOSIS — R1010 Upper abdominal pain, unspecified: Secondary | ICD-10-CM | POA: Diagnosis present

## 2018-03-19 DIAGNOSIS — Z87891 Personal history of nicotine dependence: Secondary | ICD-10-CM | POA: Insufficient documentation

## 2018-03-19 DIAGNOSIS — K8020 Calculus of gallbladder without cholecystitis without obstruction: Secondary | ICD-10-CM | POA: Diagnosis not present

## 2018-03-19 DIAGNOSIS — R1030 Lower abdominal pain, unspecified: Secondary | ICD-10-CM | POA: Insufficient documentation

## 2018-03-19 DIAGNOSIS — K859 Acute pancreatitis without necrosis or infection, unspecified: Secondary | ICD-10-CM | POA: Diagnosis not present

## 2018-03-19 DIAGNOSIS — N390 Urinary tract infection, site not specified: Secondary | ICD-10-CM | POA: Diagnosis not present

## 2018-03-19 DIAGNOSIS — R1013 Epigastric pain: Secondary | ICD-10-CM | POA: Diagnosis not present

## 2018-03-19 LAB — COMPREHENSIVE METABOLIC PANEL
ALT: 38 U/L (ref 0–44)
AST: 40 U/L (ref 15–41)
Albumin: 4.4 g/dL (ref 3.5–5.0)
Alkaline Phosphatase: 116 U/L (ref 38–126)
Anion gap: 9 (ref 5–15)
BUN: 9 mg/dL (ref 8–23)
CO2: 25 mmol/L (ref 22–32)
Calcium: 9.2 mg/dL (ref 8.9–10.3)
Chloride: 105 mmol/L (ref 98–111)
Creatinine, Ser: 0.83 mg/dL (ref 0.44–1.00)
GFR calc Af Amer: >60 mL/min (ref >60)
GFR calc non Af Amer: >60 mL/min (ref >60)
Glucose, Bld: 85 mg/dL (ref 70–99)
Potassium: 3.7 mmol/L (ref 3.5–5.1)
Sodium: 139 mmol/L (ref 135–145)
Total Bilirubin: 1.2 mg/dL (ref 0.3–1.2)
Total Protein: 7.3 g/dL (ref 6.5–8.1)

## 2018-03-19 LAB — URINALYSIS, COMPLETE (UACMP) WITH MICROSCOPIC
BILIRUBIN URINE: NEGATIVE
Glucose, UA: NEGATIVE mg/dL
Hgb urine dipstick: NEGATIVE
Ketones, ur: NEGATIVE mg/dL
Nitrite: NEGATIVE
Protein, ur: NEGATIVE mg/dL
Specific Gravity, Urine: 1.003 — ABNORMAL LOW (ref 1.005–1.030)
WBC, UA: >50 WBC/hpf — ABNORMAL HIGH (ref 0–5)
pH: 7 (ref 5.0–8.0)

## 2018-03-19 LAB — CBC
HEMATOCRIT: 44.6 % (ref 36.0–46.0)
Hemoglobin: 15.1 g/dL — ABNORMAL HIGH (ref 12.0–15.0)
MCH: 29.6 pg (ref 26.0–34.0)
MCHC: 33.9 g/dL (ref 30.0–36.0)
MCV: 87.5 fL (ref 80.0–100.0)
NRBC: 0 % (ref 0.0–0.2)
Platelets: 260 10*3/uL (ref 150–400)
RBC: 5.1 MIL/uL (ref 3.87–5.11)
RDW: 12.7 % (ref 11.5–15.5)
WBC: 6.6 10*3/uL (ref 4.0–10.5)

## 2018-03-19 LAB — LIPASE, BLOOD: LIPASE: 89 U/L — AB (ref 11–51)

## 2018-03-19 MED ORDER — SODIUM CHLORIDE 0.9 % IV SOLN
1.0000 g | Freq: Once | INTRAVENOUS | Status: AC
  Administered 2018-03-19: 1 g via INTRAVENOUS
  Filled 2018-03-19: qty 10

## 2018-03-19 MED ORDER — CEPHALEXIN 500 MG PO CAPS: 500.0000 mg | ORAL_CAPSULE | Freq: Two times a day (BID) | ORAL | 0 refills | Status: DC

## 2018-03-19 MED ORDER — HYDROCODONE-ACETAMINOPHEN 5-325 MG PO TABS: 1.0000 | ORAL_TABLET | ORAL | 0 refills | Status: DC | PRN

## 2018-03-19 NOTE — ED Triage Notes (Signed)
Pt was admitted around christmas for gallbladder but left AMA after being admitted. States pain is still there with nausea. A&O, in wheelchair. No distress noted.

## 2018-03-19 NOTE — Discharge Instructions (Signed)
Please call the number provided for Dr. Lady Gary of general surgery to discuss your discomfort and gallbladder issues, for consideration of possible elective cholecystectomy.  Return to the emergency department for any fever, any worsening pain, or any other symptom personally concerning to yourself.

## 2018-03-19 NOTE — ED Provider Notes (Signed)
[facility] Emergency Department Provider Note  Time seen: 1:23 PM  I have reviewed the triage vital signs and the nursing notes.   HISTORY  Chief Complaint No chief complaint on file.    HPI Katrina Sutton is a 68 y.o. female with a past medical history of anxiety, depression, gastric reflux, bipolar, presents to the emergency department for upper abdominal burning and lower abdominal discomfort.  According to the patient 2 weeks ago she developed upper abdominal discomfort, had a CT scan showing gallstones and was told she needed to stay the night in the hospital.  Patient states at that time she could not stay because she needed to go home and left the emergency department to go home.  States she had been doing well until yesterday when she once again developed upper abdominal pain described as a burning sensation worse with eating or drinking.  She also states for the past several days she has had a discomfort across the lower abdomen as well.  Although denies any dysuria or hematuria.  No fever.   Past Medical History:  Diagnosis Date  . Allergy    seasonal  . Anxiety   . Arthritis    osteoarthritis  . Depression   . GERD (gastroesophageal reflux disease)     Patient Active Problem List   Diagnosis Date Noted  . Dyspnea 03/11/2018  . Bipolar 1 disorder (HCC) 08/23/2017  . GERD (gastroesophageal reflux disease) 12/27/2016  . Arthritis 12/27/2016  . Anxiety 12/27/2016  . Depression 12/27/2016  . Seasonal allergic rhinitis 12/27/2016    Past Surgical History:  Procedure Laterality Date  . TONSILLECTOMY    . WRIST SURGERY Left     Prior to Admission medications   Medication Sig Start Date End Date Taking? Authorizing Provider  clonazePAM (KLONOPIN) 1 MG tablet Take 1 tablet (1 mg total) by mouth 2 (two) times daily as needed for anxiety. 02/21/18 05/22/18  Galen Manila, NP  ipratropium (ATROVENT) 0.06 % nasal spray Place 2 sprays into both  nostrils 4 (four) times daily for 5 days. 07/28/17 08/02/17  Galen Manila, NP  omeprazole (PRILOSEC) 20 MG capsule Take 1 capsule (20 mg total) by mouth 2 (two) times daily before a meal. 07/28/17   Galen Manila, NP  PARoxetine (PAXIL) 40 MG tablet Take 1 tablet (40 mg total) by mouth daily. 01/27/18   Galen Manila, NP  traZODone (DESYREL) 50 MG tablet Take 0.5 tablets (25 mg total) by mouth at bedtime for 14 days, THEN 1 tablet (50 mg total) at bedtime. 10/28/17 01/26/18  Galen Manila, NP    Allergies  Allergen Reactions  . Erythromycin Shortness Of Breath and Rash  . Duloxetine Palpitations    Family History  Problem Relation Age of Onset  . Mental illness Mother   . Ulcers Father   . Stroke Neg Hx   . Heart attack Neg Hx   . Cancer Neg Hx     Social History Social History   Tobacco Use  . Smoking status: Former Smoker    Last attempt to quit: 12/27/1976    Years since quitting: 41.2  . Smokeless tobacco: Never Used  Substance Use Topics  . Alcohol use: No  . Drug use: No    Review of Systems Constitutional: Negative for fever Cardiovascular: Negative for chest pain. Respiratory: Negative for shortness of breath. Gastrointestinal: Upper abdominal discomfort/burning.  Worse with eating.  No vomiting or diarrhea.  Mild lower abdominal discomfort.  Genitourinary: Negative for urinary compaints Musculoskeletal: Negative for musculoskeletal complaints Skin: Negative for skin complaints  Neurological: Negative for headache All other ROS negative  ____________________________________________   PHYSICAL EXAM:  VITAL SIGNS: ED Triage Vitals  Enc Vitals Group     BP 03/19/18 1213 109/68     Pulse Rate 03/19/18 1213 63     Resp --      Temp 03/19/18 1213 98.2 F (36.8 C)     Temp Source 03/19/18 1213 Oral     SpO2 03/19/18 1213 93 %     Weight 03/19/18 1214 165 lb (74.8 kg)     Height 03/19/18 1214 5\' 5"  (1.651 m)     Head  Circumference --      Peak Flow --      Pain Score 03/19/18 1216 9     Pain Loc --      Pain Edu? --      Excl. in GC? --    Constitutional: Alert and oriented. Well appearing and in no distress. Eyes: Normal exam ENT   Head: Normocephalic and atraumatic.   Mouth/Throat: Mucous membranes are moist. Cardiovascular: Normal rate, regular rhythm.  Respiratory: Normal respiratory effort without tachypnea nor retractions. Breath sounds are clear  Gastrointestinal: Soft, minimal epigastric tenderness, no rebound guarding or distention.  No right upper quadrant tenderness Musculoskeletal: Nontender with normal range of motion in all extremities.  Neurologic:  Normal speech and language. No gross focal neurologic deficits Skin:  Skin is warm, dry and intact.  Psychiatric: Mood and affect are normal.      RADIOLOGY  Ultrasound shows significant amount of stones within the patient's gallbladder however no evidence of cholecystitis and no ductal dilation.  ____________________________________________   INITIAL IMPRESSION / ASSESSMENT AND PLAN / ED COURSE  Pertinent labs & imaging results that were available during my care of the patient were reviewed by me and considered in my medical decision making (see chart for details).  Patient presents to the emergency department for upper abdominal discomfort described as a burning sensation worse with eating or drinking.  Also with lower abdominal discomfort.  Differential this time would include gallbladder disease, biliary colic, cholecystitis, pancreatitis, gastritis gastric or peptic ulcer disease, esophagitis, colitis/diverticulitis, urinary tract infection or pyelonephritis. Patient's labs have resulted showing normal LFTs, normal white blood cell count but an elevated lipase consistent with pancreatitis.  Urinalysis also appears to be consistent with urinary tract infection.  We will send urine culture, dose IV Rocephin.  Will obtain a  right upper quadrant ultrasound to further evaluate.  Pancreatitis would explain the patient's symptoms, she does not drink any alcohol.  Ultrasound negative for cholecystitis.  No ductal dilation.  No Murphy sign.  Given the patient's elevated lipase I highly suspect mild pancreatitis.  I discussed with the patient 48 hours of clear liquid diet followed by 7 days of very low-fat diet.  I also discussed general surgery follow-up for evaluation for likely elective cholecystectomy.  I discussed return precautions for any fever any worsening of pain.  Patient agreeable to plan of care.  Patient also appears have urinary tract infection, dosed IV Rocephin in the emergency department we will discharge with Keflex.  Patient agreeable to plan of care.  ____________________________________________   FINAL CLINICAL IMPRESSION(S) / ED DIAGNOSES  Pancreatitis Urinary tract infection   Minna Antis, MD 03/19/18 1420

## 2018-03-20 LAB — URINE CULTURE: Culture: 30000 — AB

## 2018-03-27 ENCOUNTER — Other Ambulatory Visit: Payer: Self-pay

## 2018-03-27 ENCOUNTER — Ambulatory Visit: Payer: Medicare HMO | Admitting: General Surgery

## 2018-03-27 ENCOUNTER — Encounter: Payer: Self-pay | Admitting: General Surgery

## 2018-03-27 VITALS — BP 157/90 | HR 80 | Resp 15 | Ht 64.5 in | Wt 163.8 lb

## 2018-03-27 DIAGNOSIS — K851 Biliary acute pancreatitis without necrosis or infection: Secondary | ICD-10-CM

## 2018-03-27 NOTE — Patient Instructions (Addendum)
You have requested to have your Gallbladder removed. We will arrange this to be done at [facility] on 04/04/18 with Dr Lady Gary. You will pre admit at the hospital prior to surgery. We will call you with that appointment date and time.   You will be off from work for approximately 1-2 weeks depending on your recovery.   Please avoid greasy and fried foods if at all possible prior to your scheduled surgery to decrease symptoms until then.  If you have any questions or concerns please call our office.    Laparoscopic Cholecystectomy Laparoscopic cholecystectomy is surgery to remove the gallbladder. The gallbladder is a pear-shaped organ that lies beneath the liver on the right side of the body. The gallbladder stores bile, which is a fluid that helps the body to digest fats. Cholecystectomy is often done for inflammation of the gallbladder (cholecystitis). This condition is usually caused by a buildup of gallstones (cholelithiasis) in the gallbladder. Gallstones can block the flow of bile, which can result in inflammation and pain. In severe cases, emergency surgery may be required. This procedure is done though small incisions in your abdomen (laparoscopic surgery). A thin scope with a camera (laparoscope) is inserted through one incision. Thin surgical instruments are inserted through the other incisions. In some cases, a laparoscopic procedure may be turned into a type of surgery that is done through a larger incision (open surgery). Tell a health care provider about:  Any allergies you have.  All medicines you are taking, including vitamins, herbs, eye drops, creams, and over-the-counter medicines.  Any problems you or family members have had with anesthetic medicines.  Any blood disorders you have.  Any surgeries you have had.  Any medical conditions you have.  Whether you are pregnant or may be pregnant. What are the risks? Generally, this is a safe procedure. However, problems  may occur, including:  Infection.  Bleeding.  Allergic reactions to medicines.  Damage to other structures or organs.  A stone remaining in the common bile duct. The common bile duct carries bile from the gallbladder into the small intestine.  A bile leak from the cyst duct that is clipped when your gallbladder is removed. What happens before the procedure? Staying hydrated Follow instructions from your health care provider about hydration, which may include:  Up to 2 hours before the procedure - you may continue to drink clear liquids, such as water, clear fruit juice, black coffee, and plain tea. Eating and drinking restrictions Follow instructions from your health care provider about eating and drinking, which may include:  8 hours before the procedure - stop eating heavy meals or foods such as meat, fried foods, or fatty foods.  6 hours before the procedure - stop eating light meals or foods, such as toast or cereal.  6 hours before the procedure - stop drinking milk or drinks that contain milk.  2 hours before the procedure - stop drinking clear liquids. Medicines  Ask your health care provider about: ? Changing or stopping your regular medicines. This is especially important if you are taking diabetes medicines or blood thinners. ? Taking medicines such as aspirin and ibuprofen. These medicines can thin your blood. Do not take these medicines before your procedure if your health care provider instructs you not to.  You may be given antibiotic medicine to help prevent infection. General instructions  Let your health care provider know if you develop a cold or an infection before surgery.  Plan to have someone take  you home from the hospital or clinic.  Ask your health care provider how your surgical site will be marked or identified. What happens during the procedure?   To reduce your risk of infection: ? Your health care team will wash or sanitize their  hands. ? Your skin will be washed with soap. ? Hair may be removed from the surgical area.  An IV tube may be inserted into one of your veins.  You will be given one or more of the following: ? A medicine to help you relax (sedative). ? A medicine to make you fall asleep (general anesthetic).  A breathing tube will be placed in your mouth.  Your surgeon will make several small cuts (incisions) in your abdomen.  The laparoscope will be inserted through one of the small incisions. The camera on the laparoscope will send images to a TV screen (monitor) in the operating room. This lets your surgeon see inside your abdomen.  Air-like gas will be pumped into your abdomen. This will expand your abdomen to give the surgeon more room to perform the surgery.  Other tools that are needed for the procedure will be inserted through the other incisions. The gallbladder will be removed through one of the incisions.  Your common bile duct may be examined. If stones are found in the common bile duct, they may be removed.  After your gallbladder has been removed, the incisions will be closed with stitches (sutures), staples, or skin glue.  Your incisions may be covered with a bandage (dressing). The procedure may vary among health care providers and hospitals. What happens after the procedure?  Your blood pressure, heart rate, breathing rate, and blood oxygen level will be monitored until the medicines you were given have worn off.  You will be given medicines as needed to control your pain.  Do not drive for 24 hours if you were given a sedative. This information is not intended to replace advice given to you by your health care provider. Make sure you discuss any questions you have with your health care provider. Document Released: 03/01/2005 Document Revised: 01/27/2017 Document Reviewed: 08/18/2015 Elsevier Interactive Patient Education  2019 [employer]

## 2018-03-27 NOTE — Progress Notes (Signed)
Patient ID: Katrina Sutton, female   DOB: 11-12-1950, 68 y.o.   MRN: [id number]  Chief Complaint  Patient presents with  . New Patient (Initial Visit)    Gallstones    HPI Katrina Sutton is a 68 y.o. female.   She is here today as a follow-up from the emergency department.  She presented on 19 March 2018 with burning upper abdominal pain, primarily in the right upper quadrant.  She states that this is the first time she has ever had the type of pain that brought her to the ED. She also describes bloating.  She says the pain was stabbing and radiated to her back.  She was apparently treated with IV fluids, antibiotics, and pain medication.  She was sent home with instructions to follow a liquid diet for the next couple of days and then a "gallbladder diet".  She reports that she also had any urinary tract infection at the time.  She states that she has done well since leaving the emergency department but did have a repeat episode of pain yesterday when she ate gravy, which she states she was not supposed to do.  She says that she took some simethicone which seemed to help.  She denies ever having had jaundice.  She has never had dark tea-colored urine or acholic stools.  She did have  pancreatitis on her lab work in the emergency department.  She is here today to discuss cholecystectomy.   Past Medical History:  Diagnosis Date  . Allergy    seasonal  . Anxiety   . Arthritis    osteoarthritis  . Depression   . GERD (gastroesophageal reflux disease)   . Hiatal hernia     Past Surgical History:  Procedure Laterality Date  . TONSILLECTOMY    . WRIST SURGERY Left     Family History  Problem Relation Age of Onset  . Mental illness Mother   . Ulcers Father   . Stroke Neg Hx   . Heart attack Neg Hx   . Cancer Neg Hx     Social History Social History   Tobacco Use  . Smoking status: Former Smoker    Last attempt to quit: 12/27/1976    Years since quitting: 41.2  . Smokeless tobacco:  Never Used  Substance Use Topics  . Alcohol use: No  . Drug use: No    Allergies  Allergen Reactions  . Erythromycin Shortness Of Breath and Rash  . Duloxetine Palpitations    Current Outpatient Medications  Medication Sig Dispense Refill  . clonazePAM (KLONOPIN) 1 MG tablet Take 1 tablet (1 mg total) by mouth 2 (two) times daily as needed for anxiety. 60 tablet 2  . omeprazole (PRILOSEC) 20 MG capsule Take 1 capsule (20 mg total) by mouth 2 (two) times daily before a meal. 180 capsule 3  . PARoxetine (PAXIL) 40 MG tablet Take 1 tablet (40 mg total) by mouth daily. 30 tablet 5  . polyethylene glycol (MIRALAX / GLYCOLAX) packet Take 17 g by mouth daily.    Marland Kitchen senna (SENOKOT) 8.6 MG TABS tablet Take 1 tablet by mouth daily as needed for mild constipation.    Marland Kitchen ipratropium (ATROVENT) 0.06 % nasal spray Place 2 sprays into both nostrils 4 (four) times daily for 5 days. 15 mL 0  . traZODone (DESYREL) 50 MG tablet Take 0.5 tablets (25 mg total) by mouth at bedtime for 14 days, THEN 1 tablet (50 mg total) at bedtime. 83 tablet 0  No current facility-administered medications for this visit.     Review of Systems Review of Systems  Constitutional: Positive for fatigue.  HENT: Positive for hearing loss.        Right ear.  Eyes:       Wears glasses.  Gastrointestinal:       GERD  Musculoskeletal: Positive for arthralgias and joint swelling.  Psychiatric/Behavioral: Positive for sleep disturbance. The patient is nervous/anxious.   All other systems reviewed and are negative.   Blood pressure (!) 157/90, pulse 80, resp. rate 15, height 5' 4.5" (1.638 m), weight 163 lb 12.8 oz (74.3 kg).  Physical Exam Physical Exam Constitutional:      General: She is not in acute distress.    Appearance: Normal appearance.  HENT:     Head: Normocephalic and atraumatic.     Ears:     Comments: Hearing aid in right ear.    Mouth/Throat:     Mouth: Mucous membranes are moist.     Pharynx: No  oropharyngeal exudate or posterior oropharyngeal erythema.     Comments: Poor dentition. Eyes:     General: No scleral icterus.       Right eye: No discharge.        Left eye: No discharge.     Pupils: Pupils are equal, round, and reactive to light.  Neck:     Musculoskeletal: Normal range of motion. No neck rigidity.  Cardiovascular:     Rate and Rhythm: Normal rate and regular rhythm.     Pulses: Normal pulses.     Heart sounds: Normal heart sounds.  Pulmonary:     Effort: Pulmonary effort is normal.     Breath sounds: Normal breath sounds.  Abdominal:     General: Abdomen is flat. Bowel sounds are normal.     Palpations: Abdomen is soft.     Hernia: A hernia is present.     Comments: Tiny umbilical hernia. Reducible. No RUQ pain, no Murphy's sign.  Genitourinary:    Comments: Deferred. Musculoskeletal:        General: Tenderness present.     Right lower leg: No edema.     Left lower leg: No edema.     Comments: Right knee  Lymphadenopathy:     Cervical: No cervical adenopathy.  Skin:    General: Skin is warm and dry.  Neurological:     General: No focal deficit present.     Mental Status: She is alert.  Psychiatric:        Mood and Affect: Mood normal.        Behavior: Behavior normal.        Thought Content: Thought content normal.        Judgment: Judgment normal.     Data Reviewed Results for Katrina Sutton, Katrina Sutton (MRN [id number]) as of 03/27/2018 16:50  Ref. Range 03/19/2018 12:17  Potassium Latest Ref Range: 3.5 - 5.1 mmol/L 3.7  Chloride Latest Ref Range: 98 - 111 mmol/L 105  CO2 Latest Ref Range: 22 - 32 mmol/L 25  Glucose Latest Ref Range: 70 - 99 mg/dL 85  BUN Latest Ref Range: 8 - 23 mg/dL 9  Creatinine Latest Ref Range: 0.44 - 1.00 mg/dL 4.090.83  Calcium Latest Ref Range: 8.9 - 10.3 mg/dL 9.2  Anion gap Latest Ref Range: 5 - 15  9  Alkaline Phosphatase Latest Ref Range: 38 - 126 U/L 116  Albumin Latest Ref Range: 3.5 - 5.0 g/dL 4.4  Lipase Latest Ref Range:  11  - 51 U/L 89 (H)  AST Latest Ref Range: 15 - 41 U/L 40  ALT Latest Ref Range: 0 - 44 U/L 38  Total Protein Latest Ref Range: 6.5 - 8.1 g/dL 7.3  Total Bilirubin Latest Ref Range: 0.3 - 1.2 mg/dL 1.2  GFR, Est Non African American Latest Ref Range: >60 mL/min >60  GFR, Est African American Latest Ref Range: >60 mL/min >60  WBC Latest Ref Range: 4.0 - 10.5 K/uL 6.6  RBC Latest Ref Range: 3.87 - 5.11 MIL/uL 5.10  Hemoglobin Latest Ref Range: 12.0 - 15.0 g/dL 16.115.1 (H)  HCT Latest Ref Range: 36.0 - 46.0 % 44.6  MCV Latest Ref Range: 80.0 - 100.0 fL 87.5  MCH Latest Ref Range: 26.0 - 34.0 pg 29.6  MCHC Latest Ref Range: 30.0 - 36.0 g/dL 09.633.9  RDW Latest Ref Range: 11.5 - 15.5 % 12.7  Platelets Latest Ref Range: 150 - 400 K/uL 260  nRBC Latest Ref Range: 0.0 - 0.2 % 0.0  Results for Katrina Sutton, Katrina Sutton (MRN [id number]) as of 03/27/2018 16:50  Ref. Range 03/19/2018 12:17  Lipase Latest Ref Range: 11 - 51 U/L 89 (H)   CLINICAL DATA:  Upper abdominal pain over the last 2 weeks.  EXAM: ULTRASOUND ABDOMEN LIMITED RIGHT UPPER QUADRANT  COMPARISON:  CT 03/09/2018  FINDINGS: Gallbladder:  Multiple stones nearly filling the gallbladder as shown by previous CT. No wall thickening or surrounding fluid. No Murphy sign. Most stones on the order 9 mm in size.  Common bile duct:  Diameter: 6 mm, upper limits of normal.  Liver:  9 mm simple cyst of the right lobe of the liver. No other liver parenchymal finding. Portal vein is patent on color Doppler imaging with normal direction of blood flow towards the liver.  IMPRESSION: Multiple gallstones nearly filling the gallbladder as shown by CT. Ultrasound does not show evidence of cholecystitis however. No wall thickening, surrounding fluid or Murphy sign. No ductal dilatation. Assessment    This is a 68 year old woman who presented to the emergency department with abdominal pain.  She was found to have an elevated lipase and multiple  gallstones, presumably gallstone pancreatitis.  She is here to discuss surgery.  I recommended that she undergo cholecystectomy.    Plan    I discussed the procedure in detail.  We discussed the risks and benefits of a laparoscopic cholecystectomy and possible cholangiogram including, but not limited to: bleeding, infection, injury to surrounding structures such as the intestine or liver, bile leak, retained gallstones, need to convert to an open procedure, prolonged diarrhea, blood clots such as DVT, common bile duct injury, anesthesia risks, and possible need for additional procedures. The patient had the opportunity to ask any questions and these were answered to her satisfaction.       Duanne GuessJennifer Sholonda Jobst 03/27/2018, 4:42 PM

## 2018-03-28 ENCOUNTER — Telehealth: Payer: Self-pay

## 2018-03-28 NOTE — Telephone Encounter (Signed)
Spoke with the patient about her pre admit date and time. The patient will pre admit at the hospital on 03/31/18 at 2:00 pm. She is aware of date and time.

## 2018-03-31 ENCOUNTER — Inpatient Hospital Stay: Admission: RE | Admit: 2018-03-31 | Payer: Medicare HMO | Source: Ambulatory Visit

## 2018-03-31 ENCOUNTER — Ambulatory Visit: Payer: Medicare HMO | Admitting: Nurse Practitioner

## 2018-04-03 ENCOUNTER — Telehealth: Payer: Self-pay | Admitting: *Deleted

## 2018-04-03 ENCOUNTER — Encounter: Admission: RE | Admit: 2018-04-03 | Payer: Medicare HMO | Source: Ambulatory Visit

## 2018-04-03 NOTE — Pre-Procedure Instructions (Addendum)
PAT appt Fri 03/31/2018 was rescheduled to Monday 04/03/2018 at request of pt. Pt a no show, notified surgeon office staff. They will contact her regarding new surgery arrival time. Called pt for phone interview, no answer.

## 2018-04-03 NOTE — Telephone Encounter (Signed)
Message left for patient to call the office.   Per Morrie Sheldon in [place], patient called them on Friday and needed to reschedule Pre-admit appointment to today.   Morrie Sheldon states patient was a no show for pre-admit appointment today but she did call and get her arrival time for surgery tomorrow.   Patient will need to arrive at 11:30 am tomorrow, 04-04-18 since she did not pre-admit as scheduled today. Just need to inform patient of new arrival time.

## 2018-04-03 NOTE — Telephone Encounter (Signed)
Patient called the office stating she has a cold and needs to cancel surgery for tomorrow, 04-04-17.  The patient wishes to call the office back once she is feeling better to reschedule.   Patient will need to pre-admit prior.

## 2018-04-04 ENCOUNTER — Encounter: Admission: RE | Payer: Self-pay | Source: Home / Self Care

## 2018-04-04 ENCOUNTER — Ambulatory Visit: Admission: RE | Admit: 2018-04-04 | Payer: Medicare HMO | Source: Home / Self Care | Admitting: General Surgery

## 2018-04-04 SURGERY — LAPAROSCOPIC CHOLECYSTECTOMY
Anesthesia: General

## 2018-04-16 ENCOUNTER — Emergency Department
Admission: EM | Admit: 2018-04-16 | Discharge: 2018-04-16 | Disposition: A | Discharge status: Home / Self Care | Payer: Medicare HMO | Attending: Emergency Medicine | Admitting: Emergency Medicine

## 2018-04-16 ENCOUNTER — Other Ambulatory Visit: Payer: Self-pay

## 2018-04-16 ENCOUNTER — Encounter: Payer: Self-pay | Admitting: Emergency Medicine

## 2018-04-16 ENCOUNTER — Emergency Department: Payer: Medicare HMO

## 2018-04-16 DIAGNOSIS — W101XXA Fall (on)(from) sidewalk curb, initial encounter: Secondary | ICD-10-CM | POA: Diagnosis not present

## 2018-04-16 DIAGNOSIS — Z79899 Other long term (current) drug therapy: Secondary | ICD-10-CM | POA: Insufficient documentation

## 2018-04-16 DIAGNOSIS — S022XXA Fracture of nasal bones, initial encounter for closed fracture: Secondary | ICD-10-CM | POA: Diagnosis not present

## 2018-04-16 DIAGNOSIS — Z87891 Personal history of nicotine dependence: Secondary | ICD-10-CM | POA: Insufficient documentation

## 2018-04-16 DIAGNOSIS — R000 Tachycardia, unspecified: Secondary | ICD-10-CM | POA: Diagnosis not present

## 2018-04-16 DIAGNOSIS — S0990XA Unspecified injury of head, initial encounter: Secondary | ICD-10-CM

## 2018-04-16 DIAGNOSIS — Y999 Unspecified external cause status: Secondary | ICD-10-CM | POA: Insufficient documentation

## 2018-04-16 DIAGNOSIS — Y9389 Activity, other specified: Secondary | ICD-10-CM | POA: Insufficient documentation

## 2018-04-16 DIAGNOSIS — Y9289 Other specified places as the place of occurrence of the external cause: Secondary | ICD-10-CM | POA: Insufficient documentation

## 2018-04-16 MED ORDER — BACITRACIN-NEOMYCIN-POLYMYXIN 400-5-5000 EX OINT
TOPICAL_OINTMENT | Freq: Once | CUTANEOUS | Status: AC
  Administered 2018-04-16: 1 via TOPICAL
  Filled 2018-04-16: qty 1

## 2018-04-16 MED ORDER — AMOXICILLIN 500 MG PO CAPS: 500.0000 mg | ORAL_CAPSULE | Freq: Two times a day (BID) | ORAL | 0 refills | Status: DC

## 2018-04-16 NOTE — ED Triage Notes (Signed)
Pt to ED via POV, pt states that she was going into [facility] and fell, hitting her face on the ground. Pt has a laceration to the forehead and nose, swelling to the nose. Pt states that it feels like her nose is stopped up. Pt denies uses of blood thinners, pt denies LOC. Pt is in NAD.

## 2018-04-16 NOTE — ED Provider Notes (Signed)
[facility] Emergency Department Provider Note  ____________________________________________   I have reviewed the triage vital signs and the nursing notes. Where available I have reviewed prior notes and, if possible and indicated, outside hospital notes.    HISTORY  Chief Complaint Fall and Facial Injury    HPI Katrina Sutton is a 68 y.o. female presents today complaining of nasal bone injury.  Patient was walking missed a curb fell bumped her nose.  Did not pass out.  Had a CT scan done as the waiting room that showed a nasal bone fracture but no other injury.  She has no diplopia, she has no trouble breathing she has no neck pain she has no hip pain this is non-syncopal fall, no prodrome.  She has minimal discomfort, she only request some Tylenol.  She has no other complaint and would like to go home if possible.  Her some clots in her nose she states   Past Medical History:  Diagnosis Date  . Allergy    seasonal  . Anxiety   . Arthritis    osteoarthritis  . Depression   . GERD (gastroesophageal reflux disease)   . Hiatal hernia     Patient Active Problem List   Diagnosis Date Noted  . Dyspnea 03/11/2018  . Bipolar 1 disorder (HCC) 08/23/2017  . GERD (gastroesophageal reflux disease) 12/27/2016  . Arthritis 12/27/2016  . Anxiety 12/27/2016  . Depression 12/27/2016  . Seasonal allergic rhinitis 12/27/2016    Past Surgical History:  Procedure Laterality Date  . TONSILLECTOMY    . WRIST SURGERY Left     Prior to Admission medications   Medication Sig Start Date End Date Taking? Authorizing Provider  clonazePAM (KLONOPIN) 1 MG tablet Take 1 tablet (1 mg total) by mouth 2 (two) times daily as needed for anxiety. 02/21/18 05/22/18  Galen ManilaKennedy, Lauren Renee, NP  hydrocortisone cream 1 % Apply 1 application topically 2 (two) times daily as needed for itching.    [provider]  ipratropium (ATROVENT) 0.06 % nasal spray Place 2 sprays into both  nostrils daily as needed for rhinitis.    [provider]  omeprazole (PRILOSEC) 40 MG capsule Take 40 mg by mouth 2 (two) times daily.    [provider]  PARoxetine (PAXIL) 40 MG tablet Take 1 tablet (40 mg total) by mouth daily. 01/27/18   Galen ManilaKennedy, Lauren Renee, NP  Polyethyl Glycol-Propyl Glycol (SYSTANE ULTRA OP) Place 1 drop into both eyes 2 (two) times daily as needed (DRY EYES).    [provider]  polyethylene glycol (MIRALAX / GLYCOLAX) packet Take 17 g by mouth daily.    [provider]  senna (SENOKOT) 8.6 MG TABS tablet Take 1 tablet by mouth daily as needed for mild constipation.    [provider]    Allergies Erythromycin and Duloxetine  Family History  Problem Relation Age of Onset  . Mental illness Mother   . Ulcers Father   . Stroke Neg Hx   . Heart attack Neg Hx   . Cancer Neg Hx     Social History Social History   Tobacco Use  . Smoking status: Former Smoker    Last attempt to quit: 12/27/1976    Years since quitting: 41.3  . Smokeless tobacco: Never Used  Substance Use Topics  . Alcohol use: No  . Drug use: No    Review of Systems Constitutional: No fever/chills Eyes: No visual changes. ENT: No sore throat. No stiff neck  no neck pain Cardiovascular: Denies chest pain. Respiratory: Denies shortness of breath. Gastrointestinal:   no vomiting.  No diarrhea.  No constipation. Genitourinary: Negative for dysuria. Musculoskeletal: Negative lower extremity swelling Skin: Negative for rash. Neurological: Negative for severe headaches, focal weakness or numbness.   ____________________________________________   PHYSICAL EXAM:  VITAL SIGNS: ED Triage Vitals  Enc Vitals Group     BP 04/16/18 1454 (!) 172/96     Pulse Rate 04/16/18 1454 (!) 120     Resp 04/16/18 1454 16     Temp 04/16/18 1454 97.8 F (36.6 C)     Temp Source 04/16/18 1454 Oral     SpO2 04/16/18 1454 95 %     Weight 04/16/18 1454 164 lb  (74.4 kg)     Height 04/16/18 1454 5\' 5"  (1.651 m)     Head Circumference --      Peak Flow --      Pain Score 04/16/18 1452 4     Pain Loc --      Pain Edu? --      Excl. in GC? --     Constitutional: Alert and oriented. Well appearing and in no acute distress. Eyes: Conjunctivae are normal EOMI Head: Atraumatic HEENT: Bruising noted to the face, there is no septal hematoma, there is under some swelling to the nose, she is able to breathe with no difficulty however, no bleeding at this time.  Mucous membranes are moist.  Oropharynx non-erythematous Neck:   Nontender with no meningismus, no masses, no stridor Cardiovascular: Normal rate, regular rhythm. Grossly normal heart sounds.  Good peripheral circulation. Respiratory: Normal respiratory effort.  No retractions. Lungs CTAB. Abdominal: Soft and nontender. No distention. No guarding no rebound Back:  There is no focal tenderness or step off.  there is no midline tenderness there are no lesions noted. there is no CVA tenderness Musculoskeletal: No lower extremity tenderness, no upper extremity tenderness. No joint effusions, no DVT signs strong distal pulses no edema Neurologic:  Normal speech and language. No gross focal neurologic deficits are appreciated.  Skin:  Skin is warm, dry and intact. No rash noted. Psychiatric: Mood and affect are normal. Speech and behavior are normal.  ____________________________________________   LABS (all labs ordered are listed, but only abnormal results are displayed)  Labs Reviewed - No data to display  Pertinent labs  results that were available during my care of the patient were reviewed by me and considered in my medical decision making (see chart for details). ____________________________________________  EKG  I personally interpreted any EKGs ordered by me or triage  ____________________________________________  RADIOLOGY  Pertinent labs & imaging results that were available during  my care of the patient were reviewed by me and considered in my medical decision making (see chart for details). If possible, patient and/or family made aware of any abnormal findings.  Ct Head Wo Contrast  Result Date: 04/16/2018 CLINICAL DATA:  Pt to ED via POV, pt states that she was going into [employer] and fell, hitting her face on the ground. Pt has a laceration to the forehead and nose, swelling to the nose. Pt states that it feels like her nose is stopped up. Pt denies use of blood thinners. No loss of consciousness. EXAM: CT HEAD WITHOUT CONTRAST CT MAXILLOFACIAL WITHOUT CONTRAST TECHNIQUE: Multidetector CT imaging of the head and maxillofacial structures were performed using the standard protocol without intravenous contrast. Multiplanar CT image reconstructions of the maxillofacial structures were also generated. COMPARISON:  None. FINDINGS: CT HEAD FINDINGS Brain: No evidence of acute infarction, hemorrhage, hydrocephalus, extra-axial collection or mass lesion/mass effect. 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 Corpus callosum appears normally formed. Vascular: No hyperdense vessel or unexpected calcification. Skull: Normal. Negative for fracture or focal lesion. Other: None. CT MAXILLOFACIAL FINDINGS Osseous: There are bilateral nasal fractures. Minimally displaced on the right approximately 3 mm, nondisplaced on the left. No nasal depression and no significant angulation of the nasal pyramid. No other fractures.  No bone lesions. Orbits: Negative. No traumatic or inflammatory finding. Sinuses: Single left middle ethmoid air cell shows mucosal thickening. Sinuses otherwise clear. Clear mastoid air cells and middle ear cavities. Soft tissues: Soft tissue swelling over the nose. No other soft tissue swelling, no masses and no lymphadenopathy.  IMPRESSION: HEAD CT 1. No acute intracranial abnormalities. 2. No skull fracture. MAXILLOFACIAL CT 1. Bilateral nasal fractures, minimally displaced on the right. There is associated nasal soft tissue swelling. 2. No other fractures or abnormalities. Electronically Signed   By: Amie Portland M.D.   On: 04/16/2018 15:43   Ct Maxillofacial Wo Contrast  Result Date: 04/16/2018 CLINICAL DATA:  Pt to ED via POV, pt states that she was going into [place] [employer] [place] and fell, hitting her face on the ground. Pt has a laceration to the forehead and nose, swelling to the nose. Pt states that it feels like her nose is stopped up. Pt denies use of blood thinners. No loss of consciousness. EXAM: CT HEAD WITHOUT CONTRAST CT MAXILLOFACIAL WITHOUT CONTRAST TECHNIQUE: Multidetector CT imaging of the head and maxillofacial structures were performed using the standard protocol without intravenous contrast. Multiplanar CT image reconstructions of the maxillofacial structures were also generated. COMPARISON:  None. FINDINGS: CT HEAD FINDINGS Brain: No evidence of acute infarction, hemorrhage, hydrocephalus, extra-axial collection or mass lesion/mass effect. 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 Corpus callosum appears normally formed. Vascular: No hyperdense vessel or unexpected calcification. Skull: Normal. Negative for fracture or focal lesion. Other: None. CT MAXILLOFACIAL FINDINGS Osseous: There are bilateral nasal fractures. Minimally displaced on the right approximately 3 mm, nondisplaced on the left. No nasal depression and no significant angulation of the nasal pyramid. No other fractures.  No bone lesions. Orbits: Negative. No traumatic or inflammatory finding. Sinuses: Single left middle ethmoid air cell shows mucosal thickening. Sinuses otherwise clear. Clear mastoid air  cells and middle ear cavities. Soft tissues: Soft tissue swelling over the nose. No other soft tissue swelling, no masses and no lymphadenopathy. IMPRESSION: HEAD CT 1. No acute intracranial abnormalities. 2. No skull fracture. MAXILLOFACIAL CT 1. Bilateral nasal fractures, minimally displaced on the right. There is associated nasal soft tissue swelling. 2. No other fractures or abnormalities. Electronically Signed   By: Amie Portland M.D.   On: 04/16/2018 15:43   ____________________________________________    PROCEDURES  Procedure(s) performed: None  Procedures  Critical Care performed: None  ____________________________________________   INITIAL IMPRESSION / ASSESSMENT AND PLAN / ED COURSE  Pertinent labs & imaging results that were available during my care of the patient were reviewed by me and considered in my medical decision making (see chart for details).  Patient here with nasal bone fracture, we will discharge with amoxicillin for the  possibility of open fracture and close outpatient follow-up with ENT.  Return precautions were given and understood.  No other injury noted not syncopal fall.    ____________________________________________   FINAL CLINICAL IMPRESSION(S) / ED DIAGNOSES  Final diagnoses:  None      This chart was dictated using voice recognition software.  Despite best efforts to proofread,  errors can occur which can change meaning.      Jeanmarie PlantMcShane, Savina Olshefski A, MD 04/16/18 [phone]

## 2018-04-21 DIAGNOSIS — S022XXA Fracture of nasal bones, initial encounter for closed fracture: Secondary | ICD-10-CM | POA: Diagnosis not present

## 2018-04-21 DIAGNOSIS — W1830XA Fall on same level, unspecified, initial encounter: Secondary | ICD-10-CM | POA: Diagnosis not present

## 2018-05-02 ENCOUNTER — Ambulatory Visit (INDEPENDENT_AMBULATORY_CARE_PROVIDER_SITE_OTHER): Payer: Medicare HMO | Admitting: Nurse Practitioner

## 2018-05-02 ENCOUNTER — Encounter: Payer: Self-pay | Admitting: Nurse Practitioner

## 2018-05-02 ENCOUNTER — Other Ambulatory Visit: Payer: Self-pay

## 2018-05-02 VITALS — BP 137/78 | HR 101 | Temp 98.0°F | Ht 65.0 in | Wt 162.2 lb

## 2018-05-02 DIAGNOSIS — K2100 Gastro-esophageal reflux disease with esophagitis, without bleeding: Secondary | ICD-10-CM

## 2018-05-02 DIAGNOSIS — F419 Anxiety disorder, unspecified: Secondary | ICD-10-CM | POA: Diagnosis not present

## 2018-05-02 MED ORDER — CLONAZEPAM 1 MG PO TABS: 1.0000 mg | ORAL_TABLET | Freq: Two times a day (BID) | ORAL | 5 refills | Status: DC | PRN

## 2018-05-02 MED ORDER — PAROXETINE HCL 40 MG PO TABS: 40.0000 mg | ORAL_TABLET | Freq: Every day | ORAL | 1 refills | Status: DC

## 2018-05-02 MED ORDER — OMEPRAZOLE 40 MG PO CPDR: 40.0000 mg | DELAYED_RELEASE_CAPSULE | Freq: Two times a day (BID) | ORAL | 1 refills | Status: DC

## 2018-05-02 NOTE — Patient Instructions (Addendum)
Katrina Sutton,   Thank you for coming in to clinic today.  1. Continue all medications without changes.  Please schedule a follow-up appointment with Wilhelmina Mcardle, AGNP. Return in about 6 months (around 10/31/2018) for anxiety, GERD.  If you have any other questions or concerns, please feel free to call the clinic or send a message through MyChart. You may also schedule an earlier appointment if necessary.  You will receive a survey after today's visit either digitally by e-mail or paper by [employer]. Your experiences and feedback matter to [place].  Please respond so we know how we are doing as we provide care for you.   Wilhelmina Mcardle, DNP, AGNP-BC Adult Gerontology Nurse Practitioner [facility], [facility]

## 2018-05-02 NOTE — Progress Notes (Signed)
Subjective:    Patient ID: Katrina Sutton, female    DOB: 09/30/1950, 68 y.o.   MRN: [id number]  Janett T Victory DakinRiley is a 68 y.o. female presenting on 05/02/2018 for Anxiety   HPI Anxiety Feels "a little hyped,"  fidgety, a little anxious/nervous.  Patient admits she is using caffeine as a drug.  Patient is working to cut back on caffeine, drinking more water.   Patient takes paroxetine 40 mg once daily.  No experienced side effects. - Patient takes clonazepam 1 mg at night.  Patient takes in morning and 1/2-1 at night.    Jonny RuizJohn is dealing with his own responsibilities more.  Kathlene NovemberMike is doing the same now.  Patient is doing better with coping to leave a situation occasionally.  Constipation/abdominal pain Patient continues having a little gas, relieved with GasEx and phazyme.  Occasionally has a sharp pain only if overloading stomach.  Patient is having good control with miralax, colace.  Senna is taking every 2-3 days prn.  - Patient is going to wait on surgery for now since she is asymptomatic again.  This was for hiatal hernia and umbilical hernia repair.  Patient will call anytime to reschedule when needed again in future. - Patient eating regular apricots for GI regularity.   GAD 7 : Generalized Anxiety Score 05/02/2018 01/27/2018 10/28/2017 08/23/2017  Nervous, Anxious, on Edge 1 1 2 3   Control/stop worrying 0 0 0 0  Worry too much - different things 2 0 1 1  Trouble relaxing 0 0 3 1  Restless 3 1 0 0  Easily annoyed or irritable 0 1 3 1   Afraid - awful might happen 0 0 0 1  Total GAD 7 Score 6 3 9 7   Anxiety Difficulty Not difficult at all Not difficult at all Not difficult at all Not difficult at all     Depression screen [facility] 2/9 05/02/2018 01/27/2018 10/28/2017 08/23/2017 05/20/2017  Decreased Interest 0 0 0 0 0  Down, Depressed, Hopeless 0 1 0 0 1  PHQ - 2 Score 0 1 0 0 1  Altered sleeping 0 1 1 1  0  Tired, decreased energy 1 0 3 1 1   Change in appetite 1 1 1 1  0  Feeling bad or failure  about yourself  0 0 0 0 0  Trouble concentrating 0 0 0 0 0  Moving slowly or fidgety/restless 1 0 0 1 0  Suicidal thoughts 0 0 0 0 0  PHQ-9 Score 3 3 5 4 2   Difficult doing work/chores Not difficult at all Not difficult at all Not difficult at all Not difficult at all Not difficult at all    Social History   Tobacco Use  . Smoking status: Former Smoker    Last attempt to quit: 12/27/1976    Years since quitting: 41.3  . Smokeless tobacco: Never Used  Substance Use Topics  . Alcohol use: No  . Drug use: No   Review of Systems Per HPI unless specifically indicated above     Objective:    BP 137/78 (BP Location: Left Arm, Patient Position: Sitting, Cuff Size: Normal)   Pulse (!) 101   Temp 98 F (36.7 C) (Oral)   Ht 5\' 5"  (1.651 m)   Wt 162 lb 3.2 oz (73.6 kg)   BMI 26.99 kg/m   Wt Readings from Last 3 Encounters:  05/02/18 162 lb 3.2 oz (73.6 kg)  04/16/18 164 lb (74.4 kg)  03/27/18 163 lb 12.8 oz (74.3  kg)    Physical Exam Vitals signs reviewed.  Constitutional:      General: She is awake. She is not in acute distress.    Appearance: Normal appearance. She is well-developed, well-groomed and overweight.  HENT:     Head: Normocephalic and atraumatic.  Neck:     Musculoskeletal: Normal range of motion and neck supple.     Vascular: No carotid bruit.  Cardiovascular:     Rate and Rhythm: Normal rate and regular rhythm.     Pulses:          Radial pulses are 2+ on the right side and 2+ on the left side.       Posterior tibial pulses are 1+ on the right side and 1+ on the left side.     Heart sounds: Normal heart sounds, S1 normal and S2 normal.  Pulmonary:     Effort: Pulmonary effort is normal. No respiratory distress.     Breath sounds: Normal breath sounds and air entry.  Abdominal:     General: Bowel sounds are normal. There is no distension.     Palpations: Abdomen is soft.     Tenderness: There is no abdominal tenderness.     Hernia: No hernia is present.    Skin:    General: Skin is warm and dry.  Neurological:     General: No focal deficit present.     Mental Status: She is alert and oriented to person, place, and time. Mental status is at baseline.  Psychiatric:        Attention and Perception: Attention normal.        Mood and Affect: Affect normal. Mood is anxious.        Speech: Speech normal.        Behavior: Behavior normal. Behavior is cooperative.        Thought Content: Thought content normal. Thought content does not include homicidal or suicidal ideation. Thought content does not include homicidal or suicidal plan.        Cognition and Memory: Cognition and memory normal.        Judgment: Judgment normal.       Assessment & Plan:   Problem List Items Addressed This Visit      Digestive   GERD (gastroesophageal reflux disease) - Primary Currently well controlled on omeprazole 40 mg twice daily.  Plan: 1. Continue medication without changes. Side effects discussed. Pt wants to continue med and should continue 2/2 hiatal hernia. 2. Avoid diet triggers. Reviewed need to seek care if globus sensation, difficulty swallowing, s/sx of GI bleed. 3. Follow up as needed and in 6 months.    Relevant Medications   omeprazole (PRILOSEC) 40 MG capsule     Other   Anxiety Stable today on exam.  Patient has stable and accessible coping strategies currently.    Medications tolerated without side effects.  Continue at current doses. Discussed tolerance and need to skip one dose occasionally or take 1/2 dose 2-3 doses per month at minimum. Refills provided.  Discussed need for pt to consider possible future psychiatrist appointment if symptoms worsen for better control of anxiety / bipolar 1 disorder in futtre. Followup 6 months and sooner prn.   Relevant Medications   clonazePAM (KLONOPIN) 1 MG tablet (Start on 05/08/2018)   PARoxetine (PAXIL) 40 MG tablet      Meds ordered this encounter  Medications  . clonazePAM (KLONOPIN) 1 MG  tablet    Sig: Take 1 tablet (1  mg total) by mouth 2 (two) times daily as needed for anxiety.    Dispense:  60 tablet    Refill:  5    Order Specific Question:   Supervising Provider    Answer:   Smitty Cords [[id number]]  . PARoxetine (PAXIL) 40 MG tablet    Sig: Take 1 tablet (40 mg total) by mouth daily.    Dispense:  90 tablet    Refill:  1    Order Specific Question:   Supervising Provider    Answer:   Smitty Cords [[id number]]  . omeprazole (PRILOSEC) 40 MG capsule    Sig: Take 1 capsule (40 mg total) by mouth 2 (two) times daily.    Dispense:  180 capsule    Refill:  1    Order Specific Question:   Supervising Provider    Answer:   Smitty Cords [[id number]]    Follow up plan: Return in about 6 months (around 10/31/2018) for anxiety, GERD.  Wilhelmina Mcardle, DNP, AGPCNP-BC Adult Gerontology Primary Care Nurse Practitioner [facility] 05/02/2018, 1:29 PM

## 2018-05-04 ENCOUNTER — Telehealth: Payer: Self-pay | Admitting: *Deleted

## 2018-05-04 NOTE — Telephone Encounter (Signed)
Message left for patient to call the office.   We need to see if patient is ready to get gallbladder surgery with Dr. Lady Gary rescheduled.

## 2018-05-15 ENCOUNTER — Emergency Department
Admission: EM | Admit: 2018-05-15 | Discharge: 2018-05-15 | Disposition: A | Discharge status: Home / Self Care | Payer: Medicare HMO | Attending: Emergency Medicine | Admitting: Emergency Medicine

## 2018-05-15 ENCOUNTER — Emergency Department: Payer: Medicare HMO

## 2018-05-15 ENCOUNTER — Telehealth: Payer: Self-pay | Admitting: *Deleted

## 2018-05-15 ENCOUNTER — Telehealth: Payer: Self-pay | Admitting: Nurse Practitioner

## 2018-05-15 ENCOUNTER — Other Ambulatory Visit: Payer: Self-pay

## 2018-05-15 ENCOUNTER — Encounter: Payer: Self-pay | Admitting: Emergency Medicine

## 2018-05-15 DIAGNOSIS — K429 Umbilical hernia without obstruction or gangrene: Secondary | ICD-10-CM | POA: Diagnosis not present

## 2018-05-15 DIAGNOSIS — Z79899 Other long term (current) drug therapy: Secondary | ICD-10-CM | POA: Insufficient documentation

## 2018-05-15 DIAGNOSIS — Z87891 Personal history of nicotine dependence: Secondary | ICD-10-CM | POA: Insufficient documentation

## 2018-05-15 DIAGNOSIS — R10815 Periumbilic abdominal tenderness: Secondary | ICD-10-CM | POA: Diagnosis not present

## 2018-05-15 DIAGNOSIS — R1033 Periumbilical pain: Secondary | ICD-10-CM | POA: Diagnosis not present

## 2018-05-15 LAB — COMPREHENSIVE METABOLIC PANEL
ALT: 15 U/L (ref 0–44)
ANION GAP: 10 (ref 5–15)
AST: 18 U/L (ref 15–41)
Albumin: 4.1 g/dL (ref 3.5–5.0)
Alkaline Phosphatase: 103 U/L (ref 38–126)
BUN: 11 mg/dL (ref 8–23)
CO2: 23 mmol/L (ref 22–32)
Calcium: 9.4 mg/dL (ref 8.9–10.3)
Chloride: 107 mmol/L (ref 98–111)
Creatinine, Ser: 0.86 mg/dL (ref 0.44–1.00)
GFR calc Af Amer: >60 mL/min (ref >60)
GFR calc non Af Amer: >60 mL/min (ref >60)
GLUCOSE: 88 mg/dL (ref 70–99)
Potassium: 3.8 mmol/L (ref 3.5–5.1)
Sodium: 140 mmol/L (ref 135–145)
TOTAL PROTEIN: 7.1 g/dL (ref 6.5–8.1)
Total Bilirubin: 1.1 mg/dL (ref 0.3–1.2)

## 2018-05-15 LAB — URINALYSIS, COMPLETE (UACMP) WITH MICROSCOPIC
Bilirubin Urine: NEGATIVE
Glucose, UA: NEGATIVE mg/dL
KETONES UR: NEGATIVE mg/dL
Nitrite: NEGATIVE
PROTEIN: NEGATIVE mg/dL
Specific Gravity, Urine: 1.011 (ref 1.005–1.030)
pH: 6 (ref 5.0–8.0)

## 2018-05-15 LAB — CBC
HEMATOCRIT: 43.4 % (ref 36.0–46.0)
Hemoglobin: 14.7 g/dL (ref 12.0–15.0)
MCH: 29.8 pg (ref 26.0–34.0)
MCHC: 33.9 g/dL (ref 30.0–36.0)
MCV: 88 fL (ref 80.0–100.0)
Platelets: 254 10*3/uL (ref 150–400)
RBC: 4.93 MIL/uL (ref 3.87–5.11)
RDW: 13.1 % (ref 11.5–15.5)
WBC: 9.9 10*3/uL (ref 4.0–10.5)
nRBC: 0 % (ref 0.0–0.2)

## 2018-05-15 LAB — LIPASE, BLOOD: Lipase: 74 U/L — ABNORMAL HIGH (ref 11–51)

## 2018-05-15 MED ORDER — TRAMADOL HCL 50 MG PO TABS: 50.0000 mg | ORAL_TABLET | Freq: Four times a day (QID) | ORAL | 0 refills | Status: DC | PRN

## 2018-05-15 MED ORDER — IOPAMIDOL (ISOVUE-300) INJECTION 61%
30.0000 mL | Freq: Once | INTRAVENOUS | Status: AC | PRN
  Administered 2018-05-15: 30 mL via ORAL
  Filled 2018-05-15: qty 30

## 2018-05-15 MED ORDER — MORPHINE SULFATE (PF) 4 MG/ML IV SOLN
4.0000 mg | Freq: Once | INTRAVENOUS | Status: AC
  Administered 2018-05-15: 4 mg via INTRAVENOUS
  Filled 2018-05-15: qty 1

## 2018-05-15 MED ORDER — ONDANSETRON HCL 4 MG/2ML IJ SOLN
4.0000 mg | Freq: Once | INTRAMUSCULAR | Status: AC
  Administered 2018-05-15: 4 mg via INTRAVENOUS
  Filled 2018-05-15: qty 2

## 2018-05-15 MED ORDER — IOHEXOL 300 MG/ML  SOLN
100.0000 mL | Freq: Once | INTRAMUSCULAR | Status: AC | PRN
  Administered 2018-05-15: 100 mL via INTRAVENOUS
  Filled 2018-05-15: qty 100

## 2018-05-15 MED ORDER — SODIUM CHLORIDE 0.9% FLUSH: 3.0000 mL | Freq: Once | INTRAVENOUS | Status: DC

## 2018-05-15 MED ORDER — IOPAMIDOL (ISOVUE-300) INJECTION 61%
30.0000 mL | Freq: Once | INTRAVENOUS | Status: DC
  Filled 2018-05-15: qty 30

## 2018-05-15 NOTE — Telephone Encounter (Signed)
Patient called back and is in a lot of pain, offered her to come see Dr.Cannon on Wednesday but patient has decided to go to the ER

## 2018-05-15 NOTE — ED Provider Notes (Signed)
[facility] Emergency Department Provider Note   ____________________________________________    I have reviewed the triage vital signs and the nursing notes.   HISTORY  Chief Complaint Abdominal Pain     HPI Katrina Sutton is a 68 y.o. female who presents with complaints of abdominal pain.  Patient describes periumbilical pain times approximately 1 day.  Denies nausea or vomiting.  Normal stools.  She states he is never had this before.  No fevers or chills.  Has not taken anything for this.  No injury to the area.  No rash reported.  States she does have a history of pancreatitis.  No dysuria or frequency  Past Medical History:  Diagnosis Date  . Allergy    seasonal  . Anxiety   . Arthritis    osteoarthritis  . Depression   . GERD (gastroesophageal reflux disease)   . Hiatal hernia     Patient Active Problem List   Diagnosis Date Noted  . Dyspnea 03/11/2018  . Bipolar 1 disorder (HCC) 08/23/2017  . GERD (gastroesophageal reflux disease) 12/27/2016  . Arthritis 12/27/2016  . Anxiety 12/27/2016  . Depression 12/27/2016  . Seasonal allergic rhinitis 12/27/2016    Past Surgical History:  Procedure Laterality Date  . TONSILLECTOMY    . WRIST SURGERY Left     Prior to Admission medications   Medication Sig Start Date End Date Taking? Authorizing Provider  clonazePAM (KLONOPIN) 1 MG tablet Take 1 tablet (1 mg total) by mouth 2 (two) times daily as needed for anxiety. 05/08/18 08/06/18  Galen Manila, NP  hydrocortisone cream 1 % Apply 1 application topically 2 (two) times daily as needed for itching.    [provider]  ipratropium (ATROVENT) 0.06 % nasal spray Place 2 sprays into both nostrils daily as needed for rhinitis.    [provider]  omeprazole (PRILOSEC) 40 MG capsule Take 1 capsule (40 mg total) by mouth 2 (two) times daily. 05/02/18   Galen Manila, NP  PARoxetine (PAXIL) 40 MG tablet Take 1  tablet (40 mg total) by mouth daily. 05/02/18   Galen Manila, NP  Polyethyl Glycol-Propyl Glycol (SYSTANE ULTRA OP) Place 1 drop into both eyes 2 (two) times daily as needed (DRY EYES).    [provider]  polyethylene glycol (MIRALAX / GLYCOLAX) packet Take 17 g by mouth daily.    [provider]  senna (SENOKOT) 8.6 MG TABS tablet Take 1 tablet by mouth daily as needed for mild constipation.    [provider]  traMADol (ULTRAM) 50 MG tablet Take 1 tablet (50 mg total) by mouth every 6 (six) hours as needed. 05/15/18 05/15/19  Jene Every, MD     Allergies Erythromycin and Duloxetine  Family History  Problem Relation Age of Onset  . Mental illness Mother   . Ulcers Father   . Stroke Neg Hx   . Heart attack Neg Hx   . Cancer Neg Hx     Social History Social History   Tobacco Use  . Smoking status: Former Smoker    Last attempt to quit: 12/27/1976    Years since quitting: 41.4  . Smokeless tobacco: Never Used  Substance Use Topics  . Alcohol use: No  . Drug use: No    Review of Systems  Constitutional: No fever/chills Eyes: No visual changes.  ENT: No sore throat. Cardiovascular: Denies chest pain. Respiratory: Denies shortness of breath. Gastrointestinal: As above Genitourinary: As above Musculoskeletal: Negative  for back pain. Skin: Negative for rash. Neurological: Negative for headaches or weakness   ____________________________________________   PHYSICAL EXAM:  VITAL SIGNS: ED Triage Vitals [05/15/18 1534]  Enc Vitals Group     BP 121/82     Pulse Rate 66     Resp 16     Temp 98 F (36.7 C)     Temp Source Oral     SpO2 99 %     Weight      Height      Head Circumference      Peak Flow      Pain Score 9     Pain Loc      Pain Edu?      Excl. in GC?     Constitutional: Alert and oriented. No acute distress. Pleasant and interactive   Mouth/Throat: Mucous membranes are moist.    Cardiovascular: Normal  rate, regular rhythm. Grossly normal heart sounds.  Good peripheral circulation. Respiratory: Normal respiratory effort.  No retractions. Lungs CTAB. Gastrointestinal: Soft, mild tenderness periumbilically.  No distention  Musculoskeletal: .  Warm and well perfused Neurologic:  Normal speech and language. No gross focal neurologic deficits are appreciated.  Skin:  Skin is warm, dry and intact. No rash noted. Psychiatric: Mood and affect are normal. Speech and behavior are normal.  ____________________________________________   LABS (all labs ordered are listed, but only abnormal results are displayed)  Labs Reviewed  LIPASE, BLOOD - Abnormal; Notable for the following components:      Result Value   Lipase 74 (*)    All other components within normal limits  URINALYSIS, COMPLETE (UACMP) WITH MICROSCOPIC - Abnormal; Notable for the following components:   Color, Urine YELLOW (*)    APPearance CLEAR (*)    Hgb urine dipstick SMALL (*)    Leukocytes,Ua LARGE (*)    WBC, UA >50 (*)    Bacteria, UA RARE (*)    Non Squamous Epithelial PRESENT (*)    All other components within normal limits  COMPREHENSIVE METABOLIC PANEL  CBC   ____________________________________________  EKG  None ____________________________________________  RADIOLOGY  CT demonstrates dilated bowel proximal to periumbilical hernia ____________________________________________   PROCEDURES  Procedure(s) performed: No  Procedures   Critical Care performed: No ____________________________________________   INITIAL IMPRESSION / ASSESSMENT AND PLAN / ED COURSE  Pertinent labs & imaging results that were available during my care of the patient were reviewed by me and considered in my medical decision making (see chart for details).  Patient with periumbilical abdominal pain, labs overall reassuring, will treat with IV morphine, IV Zofran, her pain is significant enough that we will obtain repeat  imaging.  Patient seen by Dr. Tonna Boehringer of general surgery after CT performed, he offered admission but the patient has opted to go home with close outpatient follow-up with her surgeon Dr. Lady Gary.  She reports she is feeling significantly better, return precautions discussed     ____________________________________________   FINAL CLINICAL IMPRESSION(S) / ED DIAGNOSES  Final diagnoses:  Periumbilical abdominal pain        Note:  This document was prepared using Dragon voice recognition software and may include unintentional dictation errors.   Jene Every, MD 05/15/18 2005

## 2018-05-15 NOTE — Consult Note (Signed)
Subjective:   CC: incarcerated hernia  HPI:  Katrina Sutton is a 68 y.o. female who is consulted by Cyril Loosen for evaluation of above cc.  Past history of paraesophageal hernia, gallstone pancreatitis, and an umbilical hernia.  He states that all of them have been present for some time.  This has been confirmed with reviewing her past medical records.  She states that she has been having episodic pain after eating.  The sharp, intense pain lasted for about 15 to 20 minutes prior to resolving without any specific intervention.  She states that this pain can occasionally come back on its own in between meals.  The pain is so severe that she does need to come to the emergency department like she did today.  She states that along with the pain which is located in her epigastric and right upper quadrant area, radiating to her back, she notices a bulge at her umbilical site.  She states that she does have "reflux symptoms" but that is not always associated with this pain.  She states that the bulge resolves when the pain resolves as well.  The pain does get better when she lays flat.  She was supposed to have elective lap chole with Dr. Lady Gary back in January, but had to cancel it due to personal reasons.  Not followed up with her office since then.  She stated that when she called Dr. America Brown office regarding this episode today, he was told to continue to monitor symptoms and if they worsen to proceed to the ED.  Work-up in the ED tonight showed possible incarcerated/strangulated hernia so general surgery was consulted.  At the time of this history patient states that she is not in any pain.  Denies any nausea vomiting, or any other symptoms.   Past Medical History:  has a past medical history of Allergy, Anxiety, Arthritis, Depression, GERD (gastroesophageal reflux disease), and Hiatal hernia.  Past Surgical History:  has a past surgical history that includes Tonsillectomy and Wrist surgery (Left).  Family  History: family history includes Mental illness in her mother; Ulcers in her father.  Social History:  reports that she quit smoking about 41 years ago. She has never used smokeless tobacco. She reports that she does not drink alcohol or use drugs.  Current Medications:  clonazePAM (KLONOPIN) 1 MG tablet Take 1 tablet (1 mg total) by mouth 2 (two) times daily as needed for anxiety. Galen Manila, NP Needs Review  hydrocortisone cream 1 % Apply 1 application topically 2 (two) times daily as needed for itching. [provider] Needs Review  ipratropium (ATROVENT) 0.06 % nasal spray Place 2 sprays into both nostrils daily as needed for rhinitis. [provider] Needs Review  omeprazole (PRILOSEC) 40 MG capsule Take 1 capsule (40 mg total) by mouth 2 (two) times daily. Galen Manila, NP Needs Review  PARoxetine (PAXIL) 40 MG tablet Take 1 tablet (40 mg total) by mouth daily. Galen Manila, NP Needs Review  Polyethyl Glycol-Propyl Glycol (SYSTANE ULTRA OP) Place 1 drop into both eyes 2 (two) times daily as needed (DRY EYES). [provider] Needs Review  polyethylene glycol (MIRALAX / GLYCOLAX) packet Take 17 g by mouth daily. [provider] Needs Review  senna (SENOKOT) 8.6 MG TABS tablet Take 1 tablet by mouth daily as needed for mild constipation. [provider] Needs Review     Allergies:  Allergies as of 05/15/2018 - Review Complete 05/15/2018  Allergen Reaction Noted  .  Erythromycin Shortness Of Breath and Rash 03/11/2018  . Duloxetine Palpitations 04/26/2017    ROS:  General: Denies weight loss, weight gain, fatigue, fevers, chills, and night sweats. Eyes: Denies blurry vision, double vision, eye pain, itchy eyes, and tearing. Ears: Denies hearing loss, earache, and ringing in ears. Nose: Denies sinus pain, congestion, infections, runny nose, and nosebleeds. Mouth/throat: Denies hoarseness, sore throat, bleeding  gums, and difficulty swallowing. Heart: Denies chest pain, palpitations, racing heart, irregular heartbeat, leg pain or swelling, and decreased activity tolerance. Respiratory: Denies breathing difficulty, shortness of breath, wheezing, cough, and sputum. GI: Denies change in appetite, heartburn, nausea, vomiting, constipation, diarrhea, and blood in stool. GU: Denies difficulty urinating, pain with urinating, urgency, frequency, blood in urine. Musculoskeletal: Denies joint stiffness, pain, swelling, muscle weakness. Skin: Denies rash, itching, mass, tumors, sores, and boils Neurologic: Denies headache, fainting, dizziness, seizures, numbness, and tingling. Psychiatric: Denies depression, anxiety, difficulty sleeping, and memory loss. Endocrine: Denies heat or cold intolerance, and increased thirst or urination. Blood/lymph: Denies easy bruising, easy bruising, and swollen glands      Objective:     BP 121/82 (BP Location: Right Arm)   Pulse 66   Temp 98 F (36.7 C) (Oral)   Resp 16   SpO2 99%    Constitutional :  alert, cooperative, appears stated age and no distress  Lymphatics/Throat:  no asymmetry, masses, or scars  Respiratory:  clear to auscultation bilaterally  Cardiovascular:  regular rate and rhythm  Gastrointestinal: soft, non-tender; bowel sounds normal; no masses,  no organomegaly.   Easily palpable umbilical hernia defect with clear definition of the edges measuring approximately 2 cm.  No evidence of incarceration or strangulated hernia contents at this time.  Musculoskeletal: Steady gait and movement  Skin: Cool and moist  Psychiatric: Normal affect, non-agitated, not confused       LABS:  CMP Latest Ref Rng & Units 05/15/2018 03/19/2018 03/10/2018  Glucose 70 - 99 mg/dL 88 85 997(F)  BUN 8 - 23 mg/dL 11 9 7(L)  Creatinine 4.14 - 1.00 mg/dL 2.39 5.32 0.23  Sodium 135 - 145 mmol/L 140 139 137  Potassium 3.5 - 5.1 mmol/L 3.8 3.7 3.7  Chloride 98 - 111 mmol/L 107  105 104  CO2 22 - 32 mmol/L 23 25 23   Calcium 8.9 - 10.3 mg/dL 9.4 9.2 9.0  Total Protein 6.5 - 8.1 g/dL 7.1 7.3 6.8  Total Bilirubin 0.3 - 1.2 mg/dL 1.1 1.2 0.9  Alkaline Phos 38 - 126 U/L 103 116 100  AST 15 - 41 U/L 18 40 26  ALT 0 - 44 U/L 15 38 15   CBC Latest Ref Rng & Units 05/15/2018 03/19/2018 03/11/2018  WBC 4.0 - 10.5 K/uL 9.9 6.6 -  Hemoglobin 12.0 - 15.0 g/dL 34.3 15.1(H) -  Hematocrit 36.0 - 46.0 % 43.4 44.6 -  Platelets 150 - 400 K/uL 254 260 196     RADS: CLINICAL DATA:  Abdominal pain. Diverticulitis suspected. Patient states gallbladder attack.  EXAM: CT ABDOMEN AND PELVIS WITH CONTRAST  TECHNIQUE: Multidetector CT imaging of the abdomen and pelvis was performed using the standard protocol following bolus administration of intravenous contrast.  CONTRAST:  OMNIPAQUE IOHEXOL 300 MG/ML  SOLN  COMPARISON:  CT abdomen and pelvis 03/11/2018. Right upper quadrant ultrasound 03/19/2018.  FINDINGS: Lower chest: Large posterior hernia is again seen. Partial collapse of the right lower lobe is present. Lung bases are otherwise clear. Heart size is normal.  Hepatobiliary: A benign-appearing cyst in the  right lobe of the liver is stable. No new lesions are present. Multiple layering gallstones are present. No inflammatory changes are present about the gallbladder. The common bile duct is within normal limits.  Pancreas: Unremarkable. No pancreatic ductal dilatation or surrounding inflammatory changes.  Spleen: Normal in size without focal abnormality.  Adrenals/Urinary Tract: Adrenal glands are normal bilaterally. There is some scarring in both kidneys. No stone or mass lesion is present. The ureters are within normal limits bilaterally. The urinary bladder is within normal limits.  Stomach/Bowel: A large paraesophageal hernia is again noted. The stomach is moderately distended. Contrast does pass through the stomach into the proximal small  bowel. The small bowel is followed into a paraumbilical hernia. There is some inflammatory change about the hernia. Bowel distal to the hernia is collapsed. The appendix is normal. Ascending colon is normal. Portion of the transverse colon extends into the para soft she will hernia, similar the prior study. The descending sigmoid colon are unremarkable.  Vascular/Lymphatic: No significant vascular findings are present. No enlarged abdominal or pelvic lymph nodes.  Reproductive: Uterus and bilateral adnexa are unremarkable.  Other: No other significant ventral hernias are present. No significant fluid collections are present.  Musculoskeletal: Vertebral body heights alignment are maintained. No focal lytic or blastic lesions are present.  IMPRESSION: 1. Dilated bowel proximal to paraumbilical hernia suggesting possible strangulation at this site. 2. Large paraesophageal hernia with distention of the stomach. This may be the source of pain is well. The appearance suggest gastric torsion. This is a chronic appearance. The distention is new. 3. Small portion of the transverse colon extends into the para soft ill hernia without obstruction or significant change. 4. No diverticulitis. 5. The appendix is within normal limits.   Electronically Signed   By: Marin Roberts M.D.   On: 05/15/2018 18:02 Assessment:      Abdominal pain, possible etiologies including biliary colic, reflux exacerbation, symptomatic umbilical hernia.  Plan:     Current history and physical completely benign with no evidence of an incarcerated umbilical hernia, acute cholecystitis, or any other pathology requiring immediate surgical intervention.  I did recommend that she have some sort of surgical intervention at least for the umbilical hernia and gallbladder disease to ensure that they are not the cause of these repeated episodes.  She stated that she was supposed to have surgical repair of her  paraesophageal hernia in the past but I do not see any indications of that discussed in the records.    Due to the patient's established relationship with Dr. Lady Gary in the past, I offered her to be admitted overnight for observation and have Dr. Lady Gary reassess her in the morning for possible surgical intervention.  The other option was for her to have close outpatient follow-up .  Patient opted for the latter option.  Plan was discussed with the ED provider and Dr. Lady Gary herself and all were in agreement.  All patient questions and concerns addressed at this time.

## 2018-05-15 NOTE — Telephone Encounter (Signed)
Patient called regarding having severe gallbladder pain worsening from past week end advised her to go to ER.

## 2018-05-15 NOTE — ED Triage Notes (Signed)
PT c/o " gallbladder attack". PT states abd pain radiating into her back. PT appears in no distress. VSS

## 2018-05-15 NOTE — Telephone Encounter (Signed)
Patient called and stated that she was seen on 03/27/18 for gallbladder, and now she is in a lot of pain and is bloated. She stated that the pain is worse after she eats. Got worse on Saturday. Please call and advise.

## 2018-05-15 NOTE — Telephone Encounter (Signed)
Left message for patient to call office to schedule an office visit with Dr.Cannon.

## 2018-05-15 NOTE — Telephone Encounter (Signed)
Agree with advice given.  No additional action.  Patient also has option to call Dr. America Brown office as well since she has seen them in past for cholecystectomy workup.

## 2018-05-17 ENCOUNTER — Encounter: Payer: Self-pay | Admitting: *Deleted

## 2018-05-17 ENCOUNTER — Other Ambulatory Visit: Payer: Self-pay

## 2018-05-17 ENCOUNTER — Encounter: Payer: Self-pay | Admitting: General Surgery

## 2018-05-17 ENCOUNTER — Ambulatory Visit (INDEPENDENT_AMBULATORY_CARE_PROVIDER_SITE_OTHER): Payer: Medicare HMO | Admitting: General Surgery

## 2018-05-17 VITALS — BP 133/86 | HR 74 | Temp 97.9°F | Ht 65.0 in | Wt 162.2 lb

## 2018-05-17 DIAGNOSIS — K429 Umbilical hernia without obstruction or gangrene: Secondary | ICD-10-CM

## 2018-05-17 DIAGNOSIS — K851 Biliary acute pancreatitis without necrosis or infection: Secondary | ICD-10-CM

## 2018-05-17 NOTE — Patient Instructions (Signed)
Patient will need to be scheduled for surgery with Dr.Cannon.   Call the office with any questions or concerns. 

## 2018-05-17 NOTE — Progress Notes (Signed)
Patient ID: Katrina Sutton, female   DOB: 09-14-1950, 68 y.o.   MRN: [id number]  Chief Complaint  Patient presents with  . Follow-up     wants to discuss surgery for gallbladder and hernia    HPI Katrina Sutton is a 68 y.o. female.   I initially saw her in January of this year after she presented to the emergency department.  My initial HPI is copied here:  "She is here today as a follow-up from the emergency department.  She presented on 19 March 2018 with burning upper abdominal pain, primarily in the right upper quadrant.  She states that this is the first time she has ever had the type of pain that brought her to the ED. She also describes bloating.  She says the pain was stabbing and radiated to her back.  She was apparently treated with IV fluids, antibiotics, and pain medication.  She was sent home with instructions to follow a liquid diet for the next couple of days and then a "gallbladder diet".  She reports that she also had any urinary tract infection at the time.  She states that she has done well since leaving the emergency department but did have a repeat episode of pain yesterday when she ate gravy, which she states she was not supposed to do.  She says that she took some simethicone which seemed to help.  She denies ever having had jaundice.  She has never had dark tea-colored urine or acholic stools.  She did have  pancreatitis on her lab work in the emergency department.  She is here today to discuss cholecystectomy."  We scheduled her for a laparoscopic cholecystectomy at that time, however she failed to show up for her preadmission testing and canceled the operation.  She presented to the emergency department on Monday evening with recurrence of her right upper quadrant pain as well as periumbilical pain.  She does have an umbilical hernia.  The surgeon on-call that night contacted me.  We discussed the options with the patient including admission to the hospital with immediate operation  or outpatient follow-up to schedule surgery.  She selected the latter and is here today to discuss this.  She states that she just got scared regarding her surgery the first time but feels much more comfortable with the idea at this time.  Currently, her most uncomfortable area is actually the umbilical hernia.  She denies any symptoms of incarceration or obstruction.  She does say that she hears it squishing and gurgling sometimes.    Past Medical History:  Diagnosis Date  . Allergy    seasonal  . Anxiety   . Arthritis    osteoarthritis  . Depression   . GERD (gastroesophageal reflux disease)   . Hiatal hernia     Past Surgical History:  Procedure Laterality Date  . TONSILLECTOMY    . WRIST SURGERY Left     Family History  Problem Relation Age of Onset  . Mental illness Mother   . Ulcers Father   . Stroke Neg Hx   . Heart attack Neg Hx   . Cancer Neg Hx     Social History Social History   Tobacco Use  . Smoking status: Former Smoker    Last attempt to quit: 12/27/1976    Years since quitting: 41.4  . Smokeless tobacco: Never Used  Substance Use Topics  . Alcohol use: No  . Drug use: No    Allergies  Allergen Reactions  .  Erythromycin Shortness Of Breath and Rash  . Duloxetine Palpitations    Current Outpatient Medications  Medication Sig Dispense Refill  . clonazePAM (KLONOPIN) 1 MG tablet Take 1 tablet (1 mg total) by mouth 2 (two) times daily as needed for anxiety. 60 tablet 5  . hydrocortisone cream 1 % Apply 1 application topically 2 (two) times daily as needed for itching.    Marland Kitchen ipratropium (ATROVENT) 0.06 % nasal spray Place 2 sprays into both nostrils daily as needed for rhinitis.    Marland Kitchen omeprazole (PRILOSEC) 40 MG capsule Take 1 capsule (40 mg total) by mouth 2 (two) times daily. 180 capsule 1  . PARoxetine (PAXIL) 40 MG tablet Take 1 tablet (40 mg total) by mouth daily. 90 tablet 1  . Polyethyl Glycol-Propyl Glycol (SYSTANE ULTRA OP) Place 1 drop into  both eyes 2 (two) times daily as needed (DRY EYES).    . polyethylene glycol (MIRALAX / GLYCOLAX) packet Take 17 g by mouth daily.    Marland Kitchen senna (SENOKOT) 8.6 MG TABS tablet Take 1 tablet by mouth daily as needed for mild constipation.    . traMADol (ULTRAM) 50 MG tablet Take 1 tablet (50 mg total) by mouth every 6 (six) hours as needed. 20 tablet 0   No current facility-administered medications for this visit.     Review of Systems Review of Systems  All other systems reviewed and are negative. or as per the HPI  Blood pressure 133/86, pulse 74, temperature 97.9 F (36.6 C), temperature source Temporal, height  (1.651 m), weight 162 lb 3.2 oz (73.6 kg), SpO2 93 %.  Physical Exam Physical Exam Vitals signs reviewed.  Constitutional:      General: She is not in acute distress.    Appearance: She is obese.  HENT:     Head: Normocephalic and atraumatic.     Ears:     Comments: Hearing aid in right ear.    Mouth/Throat:     Mouth: Mucous membranes are moist.     Pharynx: Oropharynx is clear. No oropharyngeal exudate or posterior oropharyngeal erythema.     Comments: Poor dentition Eyes:     General: No scleral icterus.       Right eye: No discharge.        Left eye: No discharge.     Pupils: Pupils are equal, round, and reactive to light.  Neck:     Musculoskeletal: Normal range of motion and neck supple.  Cardiovascular:     Rate and Rhythm: Normal rate and regular rhythm.     Pulses: Normal pulses.     Heart sounds: No murmur.  Pulmonary:     Effort: Pulmonary effort is normal.     Breath sounds: Normal breath sounds.  Abdominal:     General: Bowel sounds are normal. There is no distension.     Palpations: Abdomen is soft.     Tenderness: There is abdominal tenderness. There is no guarding or rebound.     Hernia: A hernia is present.     Comments: Umbilical hernia, reducible.  Genitourinary:    Comments: Deferred. Musculoskeletal: Normal range of motion.         General: No swelling or tenderness.  Lymphadenopathy:     Cervical: No cervical adenopathy.  Skin:    General: Skin is warm and dry.  Neurological:     General: No focal deficit present.     Mental Status: She is alert and oriented to person, place, and time.  Psychiatric:        Mood and Affect: Mood normal.        Behavior: Behavior normal.     Data Reviewed Results for LASHAWNA, POCHE (MRN [id number]) as of 05/17/2018 16:53  Ref. Range 05/15/2018 15:36  COMPREHENSIVE METABOLIC PANEL Unknown Rpt  Sodium Latest Ref Range: 135 - 145 mmol/L 140  Potassium Latest Ref Range: 3.5 - 5.1 mmol/L 3.8  Chloride Latest Ref Range: 98 - 111 mmol/L 107  CO2 Latest Ref Range: 22 - 32 mmol/L 23  Glucose Latest Ref Range: 70 - 99 mg/dL 88  BUN Latest Ref Range: 8 - 23 mg/dL 11  Creatinine Latest Ref Range: 0.44 - 1.00 mg/dL 7.82  Calcium Latest Ref Range: 8.9 - 10.3 mg/dL 9.4  Anion gap Latest Ref Range: 5 - 15  10  Alkaline Phosphatase Latest Ref Range: 38 - 126 U/L 103  Albumin Latest Ref Range: 3.5 - 5.0 g/dL 4.1  Lipase Latest Ref Range: 11 - 51 U/L 74 (H)  AST Latest Ref Range: 15 - 41 U/L 18  ALT Latest Ref Range: 0 - 44 U/L 15  Total Protein Latest Ref Range: 6.5 - 8.1 g/dL 7.1  Total Bilirubin Latest Ref Range: 0.3 - 1.2 mg/dL 1.1  GFR, Est Non African American Latest Ref Range: >60 mL/min >60  GFR, Est African American Latest Ref Range: >60 mL/min >60  WBC Latest Ref Range: 4.0 - 10.5 K/uL 9.9  RBC Latest Ref Range: 3.87 - 5.11 MIL/uL 4.93  Hemoglobin Latest Ref Range: 12.0 - 15.0 g/dL 95.6  HCT Latest Ref Range: 36.0 - 46.0 % 43.4  MCV Latest Ref Range: 80.0 - 100.0 fL 88.0  MCH Latest Ref Range: 26.0 - 34.0 pg 29.8  MCHC Latest Ref Range: 30.0 - 36.0 g/dL 21.3  RDW Latest Ref Range: 11.5 - 15.5 % 13.1  Platelets Latest Ref Range: 150 - 400 K/uL 254  nRBC Latest Ref Range: 0.0 - 0.2 % 0.0   CLINICAL DATA:  Abdominal pain. Diverticulitis suspected. Patient states gallbladder  attack.  EXAM: CT ABDOMEN AND PELVIS WITH CONTRAST  TECHNIQUE: Multidetector CT imaging of the abdomen and pelvis was performed using the standard protocol following bolus administration of intravenous contrast.  CONTRAST:  OMNIPAQUE IOHEXOL 300 MG/ML  SOLN  COMPARISON:  CT abdomen and pelvis 03/11/2018. Right upper quadrant ultrasound 03/19/2018.  FINDINGS: Lower chest: Large posterior hernia is again seen. Partial collapse of the right lower lobe is present. Lung bases are otherwise clear. Heart size is normal.  Hepatobiliary: A benign-appearing cyst in the right lobe of the liver is stable. No new lesions are present. Multiple layering gallstones are present. No inflammatory changes are present about the gallbladder. The common bile duct is within normal limits.  Pancreas: Unremarkable. No pancreatic ductal dilatation or surrounding inflammatory changes.  Spleen: Normal in size without focal abnormality.  Adrenals/Urinary Tract: Adrenal glands are normal bilaterally. There is some scarring in both kidneys. No stone or mass lesion is present. The ureters are within normal limits bilaterally. The urinary bladder is within normal limits.  Stomach/Bowel: A large paraesophageal hernia is again noted. The stomach is moderately distended. Contrast does pass through the stomach into the proximal small bowel. The small bowel is followed into a paraumbilical hernia. There is some inflammatory change about the hernia. Bowel distal to the hernia is collapsed. The appendix is normal. Ascending colon is normal. Portion of the transverse colon extends into the para soft she will hernia, similar  the prior study. The descending sigmoid colon are unremarkable.  Vascular/Lymphatic: No significant vascular findings are present. No enlarged abdominal or pelvic lymph nodes.  Reproductive: Uterus and bilateral adnexa are unremarkable.  Other: No other significant  ventral hernias are present. No significant fluid collections are present.  Musculoskeletal: Vertebral body heights alignment are maintained. No focal lytic or blastic lesions are present.  IMPRESSION: 1. Dilated bowel proximal to paraumbilical hernia suggesting possible strangulation at this site. 2. Large paraesophageal hernia with distention of the stomach. This may be the source of pain is well. The appearance suggest gastric torsion. This is a chronic appearance. The distention is new. 3. Small portion of the transverse colon extends into the para soft ill hernia without obstruction or significant change. 4. No diverticulitis. 5. The appendix is within normal limits.  Labs from the ED show no current concern for biliary obstruction or pancreatitis (no lipase done).  The CT scan raises the concern for incarceration of the umbilical hernia, however I am able to reduce it in clinic.  Assessment This is a 68 year old woman with abdominal pain.  She has multiple potential etiologies for this.  She has a history of gallstone pancreatitis in the past; she has a large paraesophageal hernia; and has a umbilical hernia, which is the most painful issue today.  I have recommended that she undergo a combined cholecystectomy and umbilical hernia repair.  Plan I discussed the procedure in detail.  We discussed the risks and benefits of a laparoscopic cholecystectomy and possible cholangiogram including, but not limited to: bleeding, infection, injury to surrounding structures such as the intestine or liver, bile leak, retained gallstones, need to convert to an open procedure, prolonged diarrhea, blood clots such as DVT, common bile duct injury, anesthesia risks, and possible need for additional procedures. Similar risks were discussed for the umbilical hernia repair.  I cautioned her that, should the bowel appear unhealthy, I may need to perform a partial small bowel resection.  Depending on the  size of the defect, I may also need to use mesh to close the umbilical hernia.  The patient had the opportunity to ask any questions and these were answered to her satisfaction.      Duanne Guess 05/17/2018, 4:45 PM

## 2018-05-17 NOTE — H&P (View-Only) (Signed)
Patient ID: Katrina Sutton, female   DOB: 11/17/1950, 68 y.o.   MRN: [id number]  Chief Complaint  Patient presents with  . Follow-up     wants to discuss surgery for gallbladder and hernia    HPI Katrina Sutton is a 68 y.o. female.   I initially saw her in January of this year after she presented to the emergency department.  My initial HPI is copied here:  "She is here today as a follow-up from the emergency department.  She presented on 19 March 2018 with burning upper abdominal pain, primarily in the right upper quadrant.  She states that this is the first time she has ever had the type of pain that brought her to the ED. She also describes bloating.  She says the pain was stabbing and radiated to her back.  She was apparently treated with IV fluids, antibiotics, and pain medication.  She was sent home with instructions to follow a liquid diet for the next couple of days and then a "gallbladder diet".  She reports that she also had any urinary tract infection at the time.  She states that she has done well since leaving the emergency department but did have a repeat episode of pain yesterday when she ate gravy, which she states she was not supposed to do.  She says that she took some simethicone which seemed to help.  She denies ever having had jaundice.  She has never had dark tea-colored urine or acholic stools.  She did have  pancreatitis on her lab work in the emergency department.  She is here today to discuss cholecystectomy."  We scheduled her for a laparoscopic cholecystectomy at that time, however she failed to show up for her preadmission testing and canceled the operation.  She presented to the emergency department on Monday evening with recurrence of her right upper quadrant pain as well as periumbilical pain.  She does have an umbilical hernia.  The surgeon on-call that night contacted me.  We discussed the options with the patient including admission to the hospital with immediate operation  or outpatient follow-up to schedule surgery.  She selected the latter and is here today to discuss this.  She states that she just got scared regarding her surgery the first time but feels much more comfortable with the idea at this time.  Currently, her most uncomfortable area is actually the umbilical hernia.  She denies any symptoms of incarceration or obstruction.  She does say that she hears it squishing and gurgling sometimes.    Past Medical History:  Diagnosis Date  . Allergy    seasonal  . Anxiety   . Arthritis    osteoarthritis  . Depression   . GERD (gastroesophageal reflux disease)   . Hiatal hernia     Past Surgical History:  Procedure Laterality Date  . TONSILLECTOMY    . WRIST SURGERY Left     Family History  Problem Relation Age of Onset  . Mental illness Mother   . Ulcers Father   . Stroke Neg Hx   . Heart attack Neg Hx   . Cancer Neg Hx     Social History Social History   Tobacco Use  . Smoking status: Former Smoker    Last attempt to quit: 12/27/1976    Years since quitting: 41.4  . Smokeless tobacco: Never Used  Substance Use Topics  . Alcohol use: No  . Drug use: No    Allergies  Allergen Reactions  .   Erythromycin Shortness Of Breath and Rash  . Duloxetine Palpitations    Current Outpatient Medications  Medication Sig Dispense Refill  . clonazePAM (KLONOPIN) 1 MG tablet Take 1 tablet (1 mg total) by mouth 2 (two) times daily as needed for anxiety. 60 tablet 5  . hydrocortisone cream 1 % Apply 1 application topically 2 (two) times daily as needed for itching.    . ipratropium (ATROVENT) 0.06 % nasal spray Place 2 sprays into both nostrils daily as needed for rhinitis.    . omeprazole (PRILOSEC) 40 MG capsule Take 1 capsule (40 mg total) by mouth 2 (two) times daily. 180 capsule 1  . PARoxetine (PAXIL) 40 MG tablet Take 1 tablet (40 mg total) by mouth daily. 90 tablet 1  . Polyethyl Glycol-Propyl Glycol (SYSTANE ULTRA OP) Place 1 drop into  both eyes 2 (two) times daily as needed (DRY EYES).    . polyethylene glycol (MIRALAX / GLYCOLAX) packet Take 17 g by mouth daily.    . senna (SENOKOT) 8.6 MG TABS tablet Take 1 tablet by mouth daily as needed for mild constipation.    . traMADol (ULTRAM) 50 MG tablet Take 1 tablet (50 mg total) by mouth every 6 (six) hours as needed. 20 tablet 0   No current facility-administered medications for this visit.     Review of Systems Review of Systems  All other systems reviewed and are negative. or as per the HPI  Blood pressure 133/86, pulse 74, temperature 97.9 F (36.6 C), temperature source Temporal, height 5' 5" (1.651 m), weight 162 lb 3.2 oz (73.6 kg), SpO2 93 %.  Physical Exam Physical Exam Vitals signs reviewed.  Constitutional:      General: She is not in acute distress.    Appearance: She is obese.  HENT:     Head: Normocephalic and atraumatic.     Ears:     Comments: Hearing aid in right ear.    Mouth/Throat:     Mouth: Mucous membranes are moist.     Pharynx: Oropharynx is clear. No oropharyngeal exudate or posterior oropharyngeal erythema.     Comments: Poor dentition Eyes:     General: No scleral icterus.       Right eye: No discharge.        Left eye: No discharge.     Pupils: Pupils are equal, round, and reactive to light.  Neck:     Musculoskeletal: Normal range of motion and neck supple.  Cardiovascular:     Rate and Rhythm: Normal rate and regular rhythm.     Pulses: Normal pulses.     Heart sounds: No murmur.  Pulmonary:     Effort: Pulmonary effort is normal.     Breath sounds: Normal breath sounds.  Abdominal:     General: Bowel sounds are normal. There is no distension.     Palpations: Abdomen is soft.     Tenderness: There is abdominal tenderness. There is no guarding or rebound.     Hernia: A hernia is present.     Comments: Umbilical hernia, reducible.  Genitourinary:    Comments: Deferred. Musculoskeletal: Normal range of motion.         General: No swelling or tenderness.  Lymphadenopathy:     Cervical: No cervical adenopathy.  Skin:    General: Skin is warm and dry.  Neurological:     General: No focal deficit present.     Mental Status: She is alert and oriented to person, place, and time.    Psychiatric:        Mood and Affect: Mood normal.        Behavior: Behavior normal.     Data Reviewed Results for Katrina Sutton, Katrina Sutton (MRN [id number]) as of 05/17/2018 16:53  Ref. Range 05/15/2018 15:36  COMPREHENSIVE METABOLIC PANEL Unknown Rpt  Sodium Latest Ref Range: 135 - 145 mmol/L 140  Potassium Latest Ref Range: 3.5 - 5.1 mmol/L 3.8  Chloride Latest Ref Range: 98 - 111 mmol/L 107  CO2 Latest Ref Range: 22 - 32 mmol/L 23  Glucose Latest Ref Range: 70 - 99 mg/dL 88  BUN Latest Ref Range: 8 - 23 mg/dL 11  Creatinine Latest Ref Range: 0.44 - 1.00 mg/dL 0.86  Calcium Latest Ref Range: 8.9 - 10.3 mg/dL 9.4  Anion gap Latest Ref Range: 5 - 15  10  Alkaline Phosphatase Latest Ref Range: 38 - 126 U/L 103  Albumin Latest Ref Range: 3.5 - 5.0 g/dL 4.1  Lipase Latest Ref Range: 11 - 51 U/L 74 (H)  AST Latest Ref Range: 15 - 41 U/L 18  ALT Latest Ref Range: 0 - 44 U/L 15  Total Protein Latest Ref Range: 6.5 - 8.1 g/dL 7.1  Total Bilirubin Latest Ref Range: 0.3 - 1.2 mg/dL 1.1  GFR, Est Non African American Latest Ref Range: >60 mL/min >60  GFR, Est African American Latest Ref Range: >60 mL/min >60  WBC Latest Ref Range: 4.0 - 10.5 K/uL 9.9  RBC Latest Ref Range: 3.87 - 5.11 MIL/uL 4.93  Hemoglobin Latest Ref Range: 12.0 - 15.0 g/dL 14.7  HCT Latest Ref Range: 36.0 - 46.0 % 43.4  MCV Latest Ref Range: 80.0 - 100.0 fL 88.0  MCH Latest Ref Range: 26.0 - 34.0 pg 29.8  MCHC Latest Ref Range: 30.0 - 36.0 g/dL 33.9  RDW Latest Ref Range: 11.5 - 15.5 % 13.1  Platelets Latest Ref Range: 150 - 400 K/uL 254  nRBC Latest Ref Range: 0.0 - 0.2 % 0.0   CLINICAL DATA:  Abdominal pain. Diverticulitis suspected. Patient states gallbladder  attack.  EXAM: CT ABDOMEN AND PELVIS WITH CONTRAST  TECHNIQUE: Multidetector CT imaging of the abdomen and pelvis was performed using the standard protocol following bolus administration of intravenous contrast.  CONTRAST:  100mL OMNIPAQUE IOHEXOL 300 MG/ML  SOLN  COMPARISON:  CT abdomen and pelvis 03/11/2018. Right upper quadrant ultrasound 03/19/2018.  FINDINGS: Lower chest: Large posterior hernia is again seen. Partial collapse of the right lower lobe is present. Lung bases are otherwise clear. Heart size is normal.  Hepatobiliary: A benign-appearing cyst in the right lobe of the liver is stable. No new lesions are present. Multiple layering gallstones are present. No inflammatory changes are present about the gallbladder. The common bile duct is within normal limits.  Pancreas: Unremarkable. No pancreatic ductal dilatation or surrounding inflammatory changes.  Spleen: Normal in size without focal abnormality.  Adrenals/Urinary Tract: Adrenal glands are normal bilaterally. There is some scarring in both kidneys. No stone or mass lesion is present. The ureters are within normal limits bilaterally. The urinary bladder is within normal limits.  Stomach/Bowel: A large paraesophageal hernia is again noted. The stomach is moderately distended. Contrast does pass through the stomach into the proximal small bowel. The small bowel is followed into a paraumbilical hernia. There is some inflammatory change about the hernia. Bowel distal to the hernia is collapsed. The appendix is normal. Ascending colon is normal. Portion of the transverse colon extends into the para soft she will hernia, similar   the prior study. The descending sigmoid colon are unremarkable.  Vascular/Lymphatic: No significant vascular findings are present. No enlarged abdominal or pelvic lymph nodes.  Reproductive: Uterus and bilateral adnexa are unremarkable.  Other: No other significant  ventral hernias are present. No significant fluid collections are present.  Musculoskeletal: Vertebral body heights alignment are maintained. No focal lytic or blastic lesions are present.  IMPRESSION: 1. Dilated bowel proximal to paraumbilical hernia suggesting possible strangulation at this site. 2. Large paraesophageal hernia with distention of the stomach. This may be the source of pain is well. The appearance suggest gastric torsion. This is a chronic appearance. The distention is new. 3. Small portion of the transverse colon extends into the para soft ill hernia without obstruction or significant change. 4. No diverticulitis. 5. The appendix is within normal limits.  Labs from the ED show no current concern for biliary obstruction or pancreatitis (no lipase done).  The CT scan raises the concern for incarceration of the umbilical hernia, however I am able to reduce it in clinic.  Assessment This is a 68-year-old woman with abdominal pain.  She has multiple potential etiologies for this.  She has a history of gallstone pancreatitis in the past; she has a large paraesophageal hernia; and has a umbilical hernia, which is the most painful issue today.  I have recommended that she undergo a combined cholecystectomy and umbilical hernia repair.  Plan I discussed the procedure in detail.  We discussed the risks and benefits of a laparoscopic cholecystectomy and possible cholangiogram including, but not limited to: bleeding, infection, injury to surrounding structures such as the intestine or liver, bile leak, retained gallstones, need to convert to an open procedure, prolonged diarrhea, blood clots such as DVT, common bile duct injury, anesthesia risks, and possible need for additional procedures. Similar risks were discussed for the umbilical hernia repair.  I cautioned her that, should the bowel appear unhealthy, I may need to perform a partial small bowel resection.  Depending on the  size of the defect, I may also need to use mesh to close the umbilical hernia.  The patient had the opportunity to ask any questions and these were answered to her satisfaction.      Cordarius Benning 05/17/2018, 4:45 PM   

## 2018-05-17 NOTE — Progress Notes (Signed)
Patient's surgery to be scheduled for 05-30-18 at [facility] with Dr. Lady Gary.  The patient is aware she will need to Pre-Admit. Patient will check in at [facility], [address] (first floor). Patient will be contacted once [facility] arranges a date and time.   The patient is aware to call the office should she have further questions.

## 2018-05-18 ENCOUNTER — Telehealth: Payer: Self-pay | Admitting: *Deleted

## 2018-05-18 NOTE — Telephone Encounter (Signed)
Patient called the office back and was notified of Pre-admit appointment date and time. She verbalizes understanding.

## 2018-05-18 NOTE — Telephone Encounter (Signed)
Message left for patient to call the office.   We need to inform her of Pre-admit appointment scheduled for 05-26-18 at 2 pm.

## 2018-05-22 ENCOUNTER — Telehealth: Payer: Self-pay | Admitting: *Deleted

## 2018-05-22 NOTE — Telephone Encounter (Signed)
Patient called and wanted to know what kind of diet should she be on before she has surgery with Dr.Cannon on 05/31/18

## 2018-05-26 ENCOUNTER — Other Ambulatory Visit: Payer: Self-pay

## 2018-05-26 ENCOUNTER — Encounter
Admission: RE | Admit: 2018-05-26 | Discharge: 2018-05-26 | Disposition: A | Discharge status: Home / Self Care | Payer: Medicare HMO | Source: Ambulatory Visit | Attending: General Surgery | Admitting: General Surgery

## 2018-05-26 DIAGNOSIS — Z01818 Encounter for other preprocedural examination: Secondary | ICD-10-CM | POA: Insufficient documentation

## 2018-05-26 NOTE — Pre-Procedure Instructions (Signed)
Dr. Lady Gary notified of clearance request made per Dr. Randol Kern.

## 2018-05-26 NOTE — Pre-Procedure Instructions (Signed)
Wilhelmina Mcardle NP notified of need to evaluate 2 abnormal EKG's  Done [facility] ER in the last 2 months per Fax.  Requested that clearance be faxed back with classification.

## 2018-05-26 NOTE — Patient Instructions (Signed)
Your procedure is scheduled on:Wed. 3/18 Report to Day Surgery. To find out your arrival time please call [phone] between 1PM - 3PM on  Tues 3/17.  Remember: Instructions that are not followed completely may result in serious medical risk,  up to and including death, or upon the discretion of your surgeon and anesthesiologist your  surgery may need to be rescheduled.     _X__ 1. Do not eat food after midnight the night before your procedure.                 No gum chewing or hard candies. You may drink clear liquids up to 2 hours                 before you are scheduled to arrive for your surgery- DO not drink clear                 liquids within 2 hours of the start of your surgery.                 Clear Liquids include:  water, apple juice without pulp, clear carbohydrate                 drink such as Clearfast of Gatorade, Black Coffee or Tea (Do not add                 anything to coffee or tea).  __X__2.  On the morning of surgery brush your teeth with toothpaste and water, you                may rinse your mouth with mouthwash if you wish.  Do not swallow any toothpaste of mouthwash.     _X__ 3.  No Alcohol for 24 hours before or after surgery.   _X__ 4.  Do Not Smoke or use e-cigarettes For 24 Hours Prior to Your Surgery.                 Do not use any chewable tobacco products for at least 6 hours prior to                 surgery.  ____  5.  Bring all medications with you on the day of surgery if instructed.   __x__  6.  Notify your doctor if there is any change in your medical condition      (cold, fever, infections).     Do not wear jewelry, make-up, hairpins, clips or nail polish. Do not wear lotions, powders, or perfumes. You may wear deodorant. Do not shave 48 hours prior to surgery. Men may shave face and neck. Do not bring valuables to the hospital.    [facility] is not responsible for any belongings or valuables.  Contacts, dentures  or bridgework may not be worn into surgery. Leave your suitcase in the car. After surgery it may be brought to your room. For patients admitted to the hospital, discharge time is determined by your treatment team.   Patients discharged the day of surgery will not be allowed to drive home.   Please read over the following fact sheets that you were given:     __x__ Take these medicines the morning of surgery with A SIP OF WATER:    1. clonazePAM (KLONOPIN) 1 MG tablet if needed  2. omeprazole (PRILOSEC) 40 MG capsule take dose the night before and one the morning of surgery  3. PARoxetine (PAXIL) 40 MG tablet  4.  5.  6.  ____ Fleet Enema (as directed)   __x__ Use CHG Soap as directed  ____ Use inhalers on the day of surgery  ____ Stop metformin 2 days prior to surgery    ____ Take 1/2 of usual insulin dose the night before surgery. No insulin the morning          of surgery.   ____ Stop Coumadin/Plavix/aspirin on   ____ Stop Anti-inflammatories on    ____ Stop supplements until after surgery.    ____ Bring C-Pap to the hospital.

## 2018-05-29 NOTE — Pre-Procedure Instructions (Addendum)
REFAXED CLEARANCE REQUEST AND EKG'S TO [facility] . SPOKE WITH JANICE and confirmed receipt

## 2018-05-30 ENCOUNTER — Encounter: Payer: Self-pay | Admitting: *Deleted

## 2018-05-30 MED ORDER — SODIUM CHLORIDE 0.9 % IV SOLN
2.0000 g | INTRAVENOUS | Status: AC
  Administered 2018-05-31: 2 g via INTRAVENOUS
  Filled 2018-05-30: qty 2

## 2018-05-30 NOTE — Pre-Procedure Instructions (Addendum)
Clearance on chary

## 2018-05-30 NOTE — Progress Notes (Signed)
We have received clearance for surgery on this patient from Wilhelmina Mcardle, NP. Patient at low risk-possible anterior MI occurred between 08-31-14 and 03-10-18; no significant changes to Q wave between 03-10-18 and 05-16-18.  Per Cordelia Pen with Anesthesia, they have received a copy of clearance as well.   Patient aware we have received clearance and can proceed with surgery tomorrow as scheduled. She verbalizes understanding.

## 2018-05-31 ENCOUNTER — Encounter
Admission: RE | Disposition: A | Discharge status: Home / Self Care | Payer: Self-pay | Source: Home / Self Care | Attending: General Surgery

## 2018-05-31 ENCOUNTER — Ambulatory Visit: Payer: Medicare HMO | Admitting: Anesthesiology

## 2018-05-31 ENCOUNTER — Other Ambulatory Visit: Payer: Self-pay

## 2018-05-31 ENCOUNTER — Observation Stay
Admission: RE | Admit: 2018-05-31 | Discharge: 2018-06-01 | Disposition: A | Discharge status: Home / Self Care | Payer: Medicare HMO | Attending: General Surgery | Admitting: General Surgery

## 2018-05-31 ENCOUNTER — Encounter: Payer: Self-pay | Admitting: *Deleted

## 2018-05-31 DIAGNOSIS — Z87891 Personal history of nicotine dependence: Secondary | ICD-10-CM | POA: Diagnosis not present

## 2018-05-31 DIAGNOSIS — K219 Gastro-esophageal reflux disease without esophagitis: Secondary | ICD-10-CM | POA: Diagnosis not present

## 2018-05-31 DIAGNOSIS — M1990 Unspecified osteoarthritis, unspecified site: Secondary | ICD-10-CM | POA: Diagnosis not present

## 2018-05-31 DIAGNOSIS — Z9049 Acquired absence of other specified parts of digestive tract: Secondary | ICD-10-CM

## 2018-05-31 DIAGNOSIS — K851 Biliary acute pancreatitis without necrosis or infection: Secondary | ICD-10-CM

## 2018-05-31 DIAGNOSIS — K8020 Calculus of gallbladder without cholecystitis without obstruction: Secondary | ICD-10-CM | POA: Diagnosis not present

## 2018-05-31 DIAGNOSIS — Z881 Allergy status to other antibiotic agents status: Secondary | ICD-10-CM | POA: Diagnosis not present

## 2018-05-31 DIAGNOSIS — Z79899 Other long term (current) drug therapy: Secondary | ICD-10-CM | POA: Insufficient documentation

## 2018-05-31 DIAGNOSIS — K429 Umbilical hernia without obstruction or gangrene: Secondary | ICD-10-CM | POA: Diagnosis not present

## 2018-05-31 DIAGNOSIS — K660 Peritoneal adhesions (postprocedural) (postinfection): Secondary | ICD-10-CM | POA: Insufficient documentation

## 2018-05-31 DIAGNOSIS — F419 Anxiety disorder, unspecified: Secondary | ICD-10-CM | POA: Insufficient documentation

## 2018-05-31 DIAGNOSIS — F319 Bipolar disorder, unspecified: Secondary | ICD-10-CM | POA: Insufficient documentation

## 2018-05-31 DIAGNOSIS — K449 Diaphragmatic hernia without obstruction or gangrene: Secondary | ICD-10-CM | POA: Insufficient documentation

## 2018-05-31 DIAGNOSIS — K8010 Calculus of gallbladder with chronic cholecystitis without obstruction: Secondary | ICD-10-CM | POA: Diagnosis not present

## 2018-05-31 DIAGNOSIS — R1011 Right upper quadrant pain: Secondary | ICD-10-CM | POA: Diagnosis present

## 2018-05-31 HISTORY — PX: CHOLECYSTECTOMY: SHX55

## 2018-05-31 HISTORY — PX: UMBILICAL HERNIA REPAIR: SHX196

## 2018-05-31 SURGERY — LAPAROSCOPIC CHOLECYSTECTOMY
Anesthesia: General

## 2018-05-31 MED ORDER — ONDANSETRON 4 MG PO TBDP: 4.0000 mg | ORAL_TABLET | Freq: Four times a day (QID) | ORAL | Status: DC | PRN

## 2018-05-31 MED ORDER — MIDAZOLAM HCL 2 MG/2ML IJ SOLN
INTRAMUSCULAR | Status: AC
  Filled 2018-05-31: qty 2

## 2018-05-31 MED ORDER — CLONAZEPAM 1 MG PO TABS
1.0000 mg | ORAL_TABLET | Freq: Two times a day (BID) | ORAL | Status: DC | PRN
  Administered 2018-05-31 – 2018-06-01 (×2): 1 mg via ORAL
  Filled 2018-05-31 (×2): qty 1

## 2018-05-31 MED ORDER — PROPOFOL 10 MG/ML IV BOLUS
INTRAVENOUS | Status: AC
  Filled 2018-05-31: qty 20

## 2018-05-31 MED ORDER — SENNA 8.6 MG PO TABS: 1.0000 | ORAL_TABLET | Freq: Every day | ORAL | Status: DC | PRN

## 2018-05-31 MED ORDER — KETOROLAC TROMETHAMINE 15 MG/ML IJ SOLN: 15.0000 mg | Freq: Four times a day (QID) | INTRAMUSCULAR | Status: DC | PRN

## 2018-05-31 MED ORDER — BUPIVACAINE HCL (PF) 0.25 % IJ SOLN
INTRAMUSCULAR | Status: AC
  Filled 2018-05-31: qty 30

## 2018-05-31 MED ORDER — ACETAMINOPHEN 500 MG PO TABS
ORAL_TABLET | ORAL | Status: AC
  Administered 2018-05-31: 1000 mg via ORAL
  Filled 2018-05-31: qty 2

## 2018-05-31 MED ORDER — MEPERIDINE HCL 50 MG/ML IJ SOLN: 6.2500 mg | INTRAMUSCULAR | Status: DC | PRN

## 2018-05-31 MED ORDER — LIDOCAINE HCL (CARDIAC) PF 100 MG/5ML IV SOSY
PREFILLED_SYRINGE | INTRAVENOUS | Status: DC | PRN
  Administered 2018-05-31: 100 mg via INTRAVENOUS

## 2018-05-31 MED ORDER — SODIUM CHLORIDE 0.9 % IV SOLN
INTRAVENOUS | Status: AC
  Filled 2018-05-31: qty 2

## 2018-05-31 MED ORDER — CHLORHEXIDINE GLUCONATE CLOTH 2 % EX PADS: 6.0000 | MEDICATED_PAD | Freq: Once | CUTANEOUS | Status: DC

## 2018-05-31 MED ORDER — POLYVINYL ALCOHOL 1.4 % OP SOLN
1.0000 [drp] | Freq: Two times a day (BID) | OPHTHALMIC | Status: DC | PRN
  Filled 2018-05-31: qty 15

## 2018-05-31 MED ORDER — LIDOCAINE-EPINEPHRINE 1 %-1:100000 IJ SOLN
INTRAMUSCULAR | Status: AC
  Filled 2018-05-31: qty 1

## 2018-05-31 MED ORDER — ACETAMINOPHEN 500 MG PO TABS
ORAL_TABLET | ORAL | Status: AC
  Filled 2018-05-31: qty 2

## 2018-05-31 MED ORDER — PANTOPRAZOLE SODIUM 40 MG PO TBEC
40.0000 mg | DELAYED_RELEASE_TABLET | Freq: Every day | ORAL | Status: DC
  Administered 2018-06-01: 40 mg via ORAL
  Filled 2018-05-31: qty 1

## 2018-05-31 MED ORDER — BUPIVACAINE HCL (PF) 0.25 % IJ SOLN
INTRAMUSCULAR | Status: DC | PRN
  Administered 2018-05-31: 5 mL

## 2018-05-31 MED ORDER — DEXTROSE IN LACTATED RINGERS 5 % IV SOLN
INTRAVENOUS | Status: DC
  Administered 2018-05-31 (×3): via INTRAVENOUS

## 2018-05-31 MED ORDER — ALUM & MAG HYDROXIDE-SIMETH 200-200-20 MG/5ML PO SUSP
30.0000 mL | Freq: Four times a day (QID) | ORAL | Status: DC | PRN
  Administered 2018-05-31: 30 mL via ORAL
  Filled 2018-05-31: qty 30

## 2018-05-31 MED ORDER — SUGAMMADEX SODIUM 200 MG/2ML IV SOLN
INTRAVENOUS | Status: DC | PRN
  Administered 2018-05-31: 200 mg via INTRAVENOUS

## 2018-05-31 MED ORDER — POLYETHYLENE GLYCOL 3350 17 G PO PACK
17.0000 g | PACK | Freq: Every day | ORAL | Status: DC
  Administered 2018-06-01: 17 g via ORAL
  Filled 2018-05-31: qty 1

## 2018-05-31 MED ORDER — DEXAMETHASONE SODIUM PHOSPHATE 10 MG/ML IJ SOLN
INTRAMUSCULAR | Status: DC | PRN
  Administered 2018-05-31: 10 mg via INTRAVENOUS

## 2018-05-31 MED ORDER — LIDOCAINE-EPINEPHRINE 1 %-1:100000 IJ SOLN
INTRAMUSCULAR | Status: DC | PRN
  Administered 2018-05-31: 5 mL

## 2018-05-31 MED ORDER — LORATADINE 10 MG PO TABS: 10.0000 mg | ORAL_TABLET | Freq: Every day | ORAL | Status: DC | PRN

## 2018-05-31 MED ORDER — ONDANSETRON HCL 4 MG/2ML IJ SOLN: 4.0000 mg | Freq: Four times a day (QID) | INTRAMUSCULAR | Status: DC | PRN

## 2018-05-31 MED ORDER — CELECOXIB 200 MG PO CAPS
200.0000 mg | ORAL_CAPSULE | ORAL | Status: AC
  Administered 2018-05-31: 200 mg via ORAL

## 2018-05-31 MED ORDER — OXYCODONE HCL 5 MG PO TABS: 5.0000 mg | ORAL_TABLET | Freq: Once | ORAL | Status: DC | PRN

## 2018-05-31 MED ORDER — ACETAMINOPHEN 500 MG PO TABS
1000.0000 mg | ORAL_TABLET | Freq: Four times a day (QID) | ORAL | Status: DC
  Administered 2018-05-31 – 2018-06-01 (×4): 1000 mg via ORAL
  Filled 2018-05-31 (×4): qty 2

## 2018-05-31 MED ORDER — LACTATED RINGERS IV SOLN
INTRAVENOUS | Status: DC
  Administered 2018-05-31: 07:00:00 via INTRAVENOUS

## 2018-05-31 MED ORDER — ACETAMINOPHEN 500 MG PO TABS
1000.0000 mg | ORAL_TABLET | ORAL | Status: AC
  Administered 2018-05-31: 1000 mg via ORAL

## 2018-05-31 MED ORDER — GABAPENTIN 300 MG PO CAPS
300.0000 mg | ORAL_CAPSULE | ORAL | Status: AC
  Administered 2018-05-31: 300 mg via ORAL

## 2018-05-31 MED ORDER — ROCURONIUM BROMIDE 100 MG/10ML IV SOLN
INTRAVENOUS | Status: DC | PRN
  Administered 2018-05-31: 50 mg via INTRAVENOUS

## 2018-05-31 MED ORDER — OXYCODONE HCL 5 MG/5ML PO SOLN: 5.0000 mg | Freq: Once | ORAL | Status: DC | PRN

## 2018-05-31 MED ORDER — PROMETHAZINE HCL 25 MG/ML IJ SOLN: 6.2500 mg | INTRAMUSCULAR | Status: DC | PRN

## 2018-05-31 MED ORDER — EPHEDRINE SULFATE 50 MG/ML IJ SOLN
INTRAMUSCULAR | Status: DC | PRN
  Administered 2018-05-31: 10 mg via INTRAVENOUS

## 2018-05-31 MED ORDER — SALINE SPRAY 0.65 % NA SOLN
1.0000 | NASAL | Status: DC | PRN
  Filled 2018-05-31: qty 44

## 2018-05-31 MED ORDER — FENTANYL CITRATE (PF) 100 MCG/2ML IJ SOLN
INTRAMUSCULAR | Status: AC
  Filled 2018-05-31: qty 2

## 2018-05-31 MED ORDER — OXYCODONE HCL 5 MG PO TABS: 5.0000 mg | ORAL_TABLET | ORAL | Status: DC | PRN

## 2018-05-31 MED ORDER — FENTANYL CITRATE (PF) 100 MCG/2ML IJ SOLN: 25.0000 ug | INTRAMUSCULAR | Status: DC | PRN

## 2018-05-31 MED ORDER — GABAPENTIN 300 MG PO CAPS
ORAL_CAPSULE | ORAL | Status: AC
  Administered 2018-05-31: 300 mg via ORAL
  Filled 2018-05-31: qty 1

## 2018-05-31 MED ORDER — ONDANSETRON HCL 4 MG/2ML IJ SOLN
INTRAMUSCULAR | Status: DC | PRN
  Administered 2018-05-31: 4 mg via INTRAVENOUS

## 2018-05-31 MED ORDER — SIMETHICONE 80 MG PO CHEW
40.0000 mg | CHEWABLE_TABLET | Freq: Four times a day (QID) | ORAL | Status: DC | PRN
  Filled 2018-05-31: qty 1

## 2018-05-31 MED ORDER — PAROXETINE HCL 20 MG PO TABS
40.0000 mg | ORAL_TABLET | Freq: Every day | ORAL | Status: DC
  Administered 2018-06-01: 40 mg via ORAL
  Filled 2018-05-31: qty 2

## 2018-05-31 MED ORDER — PHENYLEPHRINE HCL 10 MG/ML IJ SOLN
INTRAMUSCULAR | Status: DC | PRN
  Administered 2018-05-31: 100 ug via INTRAVENOUS

## 2018-05-31 MED ORDER — IBUPROFEN 400 MG PO TABS: 600.0000 mg | ORAL_TABLET | Freq: Four times a day (QID) | ORAL | Status: DC | PRN

## 2018-05-31 MED ORDER — PANTOPRAZOLE SODIUM 40 MG PO TBEC: 40.0000 mg | DELAYED_RELEASE_TABLET | Freq: Every day | ORAL | Status: DC

## 2018-05-31 MED ORDER — CELECOXIB 200 MG PO CAPS
ORAL_CAPSULE | ORAL | Status: AC
  Administered 2018-05-31: 200 mg via ORAL
  Filled 2018-05-31: qty 1

## 2018-05-31 MED ORDER — FENTANYL CITRATE (PF) 100 MCG/2ML IJ SOLN
INTRAMUSCULAR | Status: DC | PRN
  Administered 2018-05-31 (×2): 50 ug via INTRAVENOUS

## 2018-05-31 MED ORDER — PROPOFOL 10 MG/ML IV BOLUS
INTRAVENOUS | Status: DC | PRN
  Administered 2018-05-31: 100 mg via INTRAVENOUS

## 2018-05-31 SURGICAL SUPPLY — 53 items
APPLIER CLIP 5 13 M/L LIGAMAX5 (MISCELLANEOUS) ×2
BLADE SURG 15 STRL LF DISP TIS (BLADE) ×1 IMPLANT
BLADE SURG 15 STRL SS (BLADE) ×1
BLADE SURG SZ11 CARB STEEL (BLADE) ×2 IMPLANT
CANISTER SUCT 1200ML W/VALVE (MISCELLANEOUS) ×2 IMPLANT
CHLORAPREP W/TINT 26 (MISCELLANEOUS) ×2 IMPLANT
CLIP APPLIE 5 13 M/L LIGAMAX5 (MISCELLANEOUS) ×1 IMPLANT
COVER WAND RF STERILE (DRAPES) ×2 IMPLANT
DECANTER SPIKE VIAL GLASS SM (MISCELLANEOUS) ×4 IMPLANT
DEFOGGER SCOPE WARMER CLEARIFY (MISCELLANEOUS) ×2 IMPLANT
DERMABOND ADVANCED (GAUZE/BANDAGES/DRESSINGS) ×1
DERMABOND ADVANCED .7 DNX12 (GAUZE/BANDAGES/DRESSINGS) ×1 IMPLANT
DRAPE LAPAROTOMY 77X122 PED (DRAPES) ×2 IMPLANT
ELECT CAUTERY BLADE TIP 2.5 (TIP) ×2
ELECT REM PT RETURN 9FT ADLT (ELECTROSURGICAL) ×2
ELECTRODE CAUTERY BLDE TIP 2.5 (TIP) ×1 IMPLANT
ELECTRODE REM PT RTRN 9FT ADLT (ELECTROSURGICAL) ×1 IMPLANT
GLOVE BIO SURGEON STRL SZ 6.5 (GLOVE) ×2 IMPLANT
GLOVE INDICATOR 7.0 STRL GRN (GLOVE) ×4 IMPLANT
GOWN STRL REUS W/ TWL LRG LVL3 (GOWN DISPOSABLE) ×3 IMPLANT
GOWN STRL REUS W/TWL LRG LVL3 (GOWN DISPOSABLE) ×3
GRASPER SUT TROCAR 14GX15 (MISCELLANEOUS) IMPLANT
IRRIGATION STRYKERFLOW (MISCELLANEOUS) ×1 IMPLANT
IRRIGATOR STRYKERFLOW (MISCELLANEOUS) ×2
IV NS 1000ML (IV SOLUTION) ×1
IV NS 1000ML BAXH (IV SOLUTION) ×1 IMPLANT
KIT TURNOVER KIT A (KITS) ×2 IMPLANT
LABEL OR SOLS (LABEL) ×2 IMPLANT
NEEDLE HYPO 22GX1.5 SAFETY (NEEDLE) ×2 IMPLANT
NS IRRIG 500ML POUR BTL (IV SOLUTION) ×2 IMPLANT
PACK BASIN MINOR ARMC (MISCELLANEOUS) ×2 IMPLANT
PACK LAP CHOLECYSTECTOMY (MISCELLANEOUS) ×2 IMPLANT
PENCIL ELECTRO HAND CTR (MISCELLANEOUS) ×2 IMPLANT
POUCH SPECIMEN RETRIEVAL 10MM (ENDOMECHANICALS) ×2 IMPLANT
SCISSORS METZENBAUM CVD 33 (INSTRUMENTS) ×2 IMPLANT
SET TUBE SMOKE EVAC HIGH FLOW (TUBING) ×2 IMPLANT
SLEEVE ADV FIXATION 5X100MM (TROCAR) ×4 IMPLANT
SOLUTION ELECTROLUBE (MISCELLANEOUS) ×2 IMPLANT
STRIP CLOSURE SKIN 1/2X4 (GAUZE/BANDAGES/DRESSINGS) ×2 IMPLANT
SUT ETHIBOND NAB MO 7 #0 18IN (SUTURE) ×2 IMPLANT
SUT MNCRL 4-0 (SUTURE) ×1
SUT MNCRL 4-0 27XMFL (SUTURE) ×1
SUT SILK 3-0 (SUTURE) ×2 IMPLANT
SUT VIC AB 3-0 SH 27 (SUTURE) ×1
SUT VIC AB 3-0 SH 27X BRD (SUTURE) ×1 IMPLANT
SUT VICRYL 0 AB UR-6 (SUTURE) ×4 IMPLANT
SUTURE MNCRL 4-0 27XMF (SUTURE) ×1 IMPLANT
SYR 10ML LL (SYRINGE) ×2 IMPLANT
SYR BULB IRRIG 60ML STRL (SYRINGE) ×2 IMPLANT
TROCAR ADV FIXATION 12X100MM (TROCAR) ×2 IMPLANT
TROCAR XCEL 12X100 BLDLESS (ENDOMECHANICALS) ×2 IMPLANT
TROCAR Z-THREAD OPTICAL 5X100M (TROCAR) ×2 IMPLANT
WATER STERILE IRR 1000ML POUR (IV SOLUTION) ×2 IMPLANT

## 2018-05-31 NOTE — Transfer of Care (Signed)
Immediate Anesthesia Transfer of Care Note  Patient: Katrina Sutton  Procedure(s) Performed: LAPAROSCOPIC CHOLECYSTECTOMY (N/A ) HERNIA REPAIR UMBILICAL ADULT (N/A )  Patient Location: PACU  Anesthesia Type:General  Level of Consciousness: awake, alert  and oriented  Airway & Oxygen Therapy: Patient Spontanous Breathing and Patient connected to face mask oxygen  Post-op Assessment: Report given to RN and Post -op Vital signs reviewed and stable  Post vital signs: Reviewed and stable  Last Vitals:  Vitals Value Taken Time  BP 149/88 05/31/2018  9:38 AM  Temp    Pulse 73 05/31/2018  9:38 AM  Resp 20 05/31/2018  9:38 AM  SpO2 100 % 05/31/2018  9:38 AM  Vitals shown include unvalidated device data.  Last Pain:  Vitals:   05/31/18 5329  TempSrc: Oral  PainSc: 0-No pain         Complications: No apparent anesthesia complications

## 2018-05-31 NOTE — Anesthesia Post-op Follow-up Note (Signed)
Anesthesia QCDR form completed.        

## 2018-05-31 NOTE — Anesthesia Preprocedure Evaluation (Signed)
Anesthesia Evaluation  Patient identified by MRN, date of birth, ID band Patient awake    Reviewed: Allergy & Precautions, NPO status , Patient's Chart, lab work & pertinent test results  History of Anesthesia Complications Negative for: history of anesthetic complications  Airway Mallampati: II  TM Distance: >3 FB Neck ROM: Full    Dental  (+) Poor Dentition, Missing   Pulmonary neg sleep apnea, neg COPD, former smoker,    breath sounds clear to auscultation- rhonchi (-) wheezing      Cardiovascular (-) hypertension(-) CAD, (-) Past MI, (-) Cardiac Stents and (-) CABG  Rhythm:Regular Rate:Normal - Systolic murmurs and - Diastolic murmurs    Neuro/Psych neg Seizures PSYCHIATRIC DISORDERS Anxiety Depression Bipolar Disorder negative neurological ROS     GI/Hepatic Neg liver ROS, hiatal hernia, GERD  ,  Endo/Other  negative endocrine ROSneg diabetes  Renal/GU negative Renal ROS     Musculoskeletal  (+) Arthritis ,   Abdominal (+) - obese,   Peds  Hematology negative hematology ROS (+)   Anesthesia Other Findings Past Medical History: No date: Allergy     Comment:  seasonal No date: Anxiety No date: Arthritis     Comment:  osteoarthritis No date: Depression No date: GERD (gastroesophageal reflux disease) No date: Hiatal hernia   Reproductive/Obstetrics                             Anesthesia Physical Anesthesia Plan  ASA: II  Anesthesia Plan: General   Post-op Pain Management:    Induction: Intravenous  PONV Risk Score and Plan: 2 and Ondansetron and Dexamethasone  Airway Management Planned: Oral ETT  Additional Equipment:   Intra-op Plan:   Post-operative Plan: Extubation in OR  Informed Consent: I have reviewed the patients History and Physical, chart, labs and discussed the procedure including the risks, benefits and alternatives for the proposed anesthesia with the  patient or authorized representative who has indicated his/her understanding and acceptance.     Dental advisory given  Plan Discussed with: CRNA and Anesthesiologist  Anesthesia Plan Comments:         Anesthesia Quick Evaluation

## 2018-05-31 NOTE — Anesthesia Procedure Notes (Signed)
Procedure Name: Intubation Date/Time: 05/31/2018 7:52 AM Performed by: Philbert Riser, CRNA Pre-anesthesia Checklist: Patient identified, Emergency Drugs available, Suction available, Patient being monitored and Timeout performed Patient Re-evaluated:Patient Re-evaluated prior to induction Oxygen Delivery Method: Circle system utilized and Simple face mask Preoxygenation: Pre-oxygenation with 100% oxygen Induction Type: IV induction Ventilation: Mask ventilation without difficulty and Oral airway inserted - appropriate to patient size Laryngoscope Size: Mac and 3 Grade View: Grade I Tube type: Oral Tube size: 7.0 mm Number of attempts: 1 Airway Equipment and Method: Stylet Placement Confirmation: ETT inserted through vocal cords under direct vision Secured at: 21 cm Tube secured with: Tape Dental Injury: Teeth and Oropharynx as per pre-operative assessment

## 2018-05-31 NOTE — Op Note (Signed)
Operative Note  Preoperative Diagnosis:  1. Gallstone pancreatitis             2. Umbilical hernia  Postoperative Diagnosis:  same  Operation:  1. Laparoscopic cholecystectomy           2. Open umbilical hernia repair without mesh  Surgeon: Duanne Guess, MD  Assistant: none  Anesthesia: GETA  Findings: Multiple adhesions of omentum to the gallbladder, consistent with prior inflammatory process.  Normal biliary anatomy.  Small umbilical hernia containing a loop of small bowel.  Indications: Katrina Sutton is a 68 year old woman who initially presented to the emergency department with abdominal pain.  She was found to have pancreatitis as well as gallstones suggesting the stones as an etiology.  She was referred for surgical evaluation and initially scheduled for a cholecystectomy, but she canceled the procedure.  She then presented again to the emergency department with abdominal pain, this time more periumbilical.  She was identified as having an umbilical hernia.  After further discussion, she agreed to proceed with both a cholecystectomy and umbilical hernia repair.  Procedure In Detail: The patient was identified in the preoperative holding area and brought to the operating room where she was placed supine on the OR table.  All bony prominences were padded and bilateral sequential compression devices were placed on the lower extremities.  General endotracheal anesthesia was induced without incident.  Patient was positioned appropriately for the operation and then sterilely prepped and draped in standard fashion.  A timeout was performed confirming the patient's identity, the procedure being performed, her allergies, all necessary equipment was available, and that maintenance anesthetic was adequate.  A perioperative dose of antibiotics was administered by the anesthesia service.  In light of the known umbilical hernia, I entered the abdomen using Optiview technique with a 5 mm port in the  right upper quadrant just inferior to the costal margin in the midclavicular line.  Pneumoperitoneum was achieved with carbon dioxide gas.  A lateral 5 mm port was placed as well as a third 5 mm port in the subxiphoid position.  The umbilical area was examined and an obvious hernia defect was present.  I placed a 12 mm port just cranial to the defect.  The patient was positioned in reverse Trendelenburg position and tilted towards the left.  The gallbladder was then grasped and retracted cranially.  There were multiple adhesions of omentum that were swept away using a combination of blunt dissection and electrocautery.  I was then able to grasp the infundibulum and dissect out the cystic duct and artery.  A critical view of safety was obtained.  The duct was doubly clipped on the patient's side and singly clipped on the specimen side and divided with scissors.  The cystic artery was treated in similar fashion.  The gallbladder was then dissected off the liver bed using electrocautery.  It was placed in an Endopouch and removed through the 12 mm port site.  The liver bed was examined and irrigated thoroughly.  No bile leak or bleeding was appreciated.  All fluids were suctioned free.  The umbilical site was reevaluated and it was felt that the defect was not amenable to closure with the PMI device and I decided to close it primarily from an external approach.  The laparoscopic trochars were all removed under direct vision and the abdomen was desufflated.  The table was leveled.  I extended the periumbilical trocar incision.  The hernia sac was identified and dissected free from the  surrounding tissues using blunt and sharp dissection with the Metzenbaum scissors.  As I was scissoring a band of the sac, it became apparent that I had entered a loop of small intestine that had made its way into the defect.  The tissue edges were not devitalized and the defect was small, therefore I elected to repair it primarily.   This was done with multiple silk Lembert sutures.  The bowel was then tucked back through the defect into the abdominal cavity.  The fascial edges of the defect were grasped with Kocher clamps.  The defect was closed with interrupted 0 Ethibond sutures.  The potential space was closed with 3-0 Vicryl.  The skin was closed with running subcuticular Monocryl.  The remaining port sites were closed similarly.  The skin was cleaned and Dermabond and Steri-Strips were applied.  Patient was then awakened, extubated, and taken to the postanesthesia care unit in good condition.    EBL: <10 cc  IVF: See anesthesia record  Specimen(s): Gallbladder  Complications: Inadvertent enterotomy, primarily repaired  Counts: all needles, instruments, and sponges were counted and reported to be correct in number at the end of the case.   I was present for and participated in the entire operation.  Duanne Guess  9:50 AM

## 2018-05-31 NOTE — Interval H&P Note (Signed)
History and Physical Interval Note:  05/31/2018 7:38 AM  Katrina Sutton  has presented today for surgery, with the diagnosis of GALLSTONES K80.20 UMBILICAL HERNIA K42.9.  The various methods of treatment have been discussed with the patient and family. After consideration of risks, benefits and other options for treatment, the patient has consented to  Procedure(s): LAPAROSCOPIC CHOLECYSTECTOMY (N/A) HERNIA REPAIR UMBILICAL ADULT (N/A) as a surgical intervention.  The patient's history has been reviewed, patient examined, no change in status, stable for surgery.  I have reviewed the patient's chart and labs.  Questions were answered to the patient's satisfaction.     Duanne Guess

## 2018-05-31 NOTE — Anesthesia Postprocedure Evaluation (Signed)
Anesthesia Post Note  Patient: Katrina Sutton  Procedure(s) Performed: LAPAROSCOPIC CHOLECYSTECTOMY (N/A ) HERNIA REPAIR UMBILICAL ADULT (N/A )  Patient location during evaluation: PACU Anesthesia Type: General Level of consciousness: awake and alert and oriented Pain management: pain level controlled Vital Signs Assessment: post-procedure vital signs reviewed and stable Respiratory status: spontaneous breathing, nonlabored ventilation and respiratory function stable Cardiovascular status: blood pressure returned to baseline and stable Postop Assessment: no signs of nausea or vomiting Anesthetic complications: no     Last Vitals:  Vitals:   05/31/18 1130 05/31/18 1145  BP: 135/72 135/74  Pulse: 76 76  Resp: 16 17  Temp:    SpO2: 95% 94%    Last Pain:  Vitals:   05/31/18 1130  TempSrc:   PainSc: 0-No pain                 Kadia Abaya

## 2018-06-01 DIAGNOSIS — Z87891 Personal history of nicotine dependence: Secondary | ICD-10-CM | POA: Diagnosis not present

## 2018-06-01 DIAGNOSIS — Z79899 Other long term (current) drug therapy: Secondary | ICD-10-CM | POA: Diagnosis not present

## 2018-06-01 DIAGNOSIS — K660 Peritoneal adhesions (postprocedural) (postinfection): Secondary | ICD-10-CM | POA: Diagnosis not present

## 2018-06-01 DIAGNOSIS — K449 Diaphragmatic hernia without obstruction or gangrene: Secondary | ICD-10-CM | POA: Diagnosis not present

## 2018-06-01 DIAGNOSIS — K219 Gastro-esophageal reflux disease without esophagitis: Secondary | ICD-10-CM | POA: Diagnosis not present

## 2018-06-01 DIAGNOSIS — F419 Anxiety disorder, unspecified: Secondary | ICD-10-CM | POA: Diagnosis not present

## 2018-06-01 DIAGNOSIS — K429 Umbilical hernia without obstruction or gangrene: Secondary | ICD-10-CM | POA: Diagnosis not present

## 2018-06-01 DIAGNOSIS — M1990 Unspecified osteoarthritis, unspecified site: Secondary | ICD-10-CM | POA: Diagnosis not present

## 2018-06-01 DIAGNOSIS — K8010 Calculus of gallbladder with chronic cholecystitis without obstruction: Secondary | ICD-10-CM | POA: Diagnosis not present

## 2018-06-01 LAB — BASIC METABOLIC PANEL
Anion gap: 7 (ref 5–15)
BUN: 14 mg/dL (ref 8–23)
CO2: 26 mmol/L (ref 22–32)
Calcium: 8.6 mg/dL — ABNORMAL LOW (ref 8.9–10.3)
Chloride: 108 mmol/L (ref 98–111)
Creatinine, Ser: 0.82 mg/dL (ref 0.44–1.00)
GFR calc Af Amer: >60 mL/min (ref >60)
GFR calc non Af Amer: >60 mL/min (ref >60)
Glucose, Bld: 142 mg/dL — ABNORMAL HIGH (ref 70–99)
POTASSIUM: 4.1 mmol/L (ref 3.5–5.1)
Sodium: 141 mmol/L (ref 135–145)

## 2018-06-01 LAB — SURGICAL PATHOLOGY

## 2018-06-01 LAB — CBC
HCT: 37.9 % (ref 36.0–46.0)
Hemoglobin: 12.7 g/dL (ref 12.0–15.0)
MCH: 30 pg (ref 26.0–34.0)
MCHC: 33.5 g/dL (ref 30.0–36.0)
MCV: 89.6 fL (ref 80.0–100.0)
Platelets: 217 10*3/uL (ref 150–400)
RBC: 4.23 MIL/uL (ref 3.87–5.11)
RDW: 13.2 % (ref 11.5–15.5)
WBC: 16.2 10*3/uL — ABNORMAL HIGH (ref 4.0–10.5)
nRBC: 0 % (ref 0.0–0.2)

## 2018-06-01 LAB — MAGNESIUM: Magnesium: 2.3 mg/dL (ref 1.7–2.4)

## 2018-06-01 LAB — PHOSPHORUS: Phosphorus: 3.4 mg/dL (ref 2.5–4.6)

## 2018-06-01 MED ORDER — OXYCODONE HCL 5 MG PO TABS: 5.0000 mg | ORAL_TABLET | ORAL | 0 refills | Status: DC | PRN

## 2018-06-01 NOTE — Discharge Instructions (Signed)
In addition to included general post-operative instructions for laparoscopic cholecystectomy and open umbilical hernia repair,  Diet: Resume home heart healthy diet.   Activity: No heavy lifting >20 pounds (children, pets, laundry, garbage) or strenuous activity until follow-up in 2 weeks, but light activity and walking are encouraged. Do not drive or drink alcohol if taking narcotic pain medications.  Wound care: 2 days after surgery (03/19), you may shower/get incision wet with soapy water and pat dry (do not rub incisions), but no baths or submerging incision underwater until follow-up.   Medications: Resume all home medications. For mild to moderate pain: acetaminophen (Tylenol) or ibuprofen/naproxen (if no kidney disease). Combining Tylenol with alcohol can substantially increase your risk of causing liver disease. Narcotic pain medications, if prescribed, can be used for severe pain, though may cause nausea, constipation, and drowsiness. Do not combine Tylenol and Percocet (or similar) within a 6 hour period as Percocet (and similar) contain(s) Tylenol. If you do not need the narcotic pain medication, you do not need to fill the prescription.  Call office [phone] / [phone]) at any time if any questions, worsening pain, fevers/chills, bleeding, drainage from incision site, or other concerns.

## 2018-06-01 NOTE — Care Management Obs Status (Signed)
MEDICARE OBSERVATION STATUS NOTIFICATION   Patient Details  Name: Katrina Sutton MRN: [id number] Date of Birth: 1950/05/16   Medicare Observation Status Notification Given:  Yes    Corey Harold 06/01/2018, 11:06 AM

## 2018-06-01 NOTE — Discharge Summary (Addendum)
[facility] SURGICAL DISCHARGE SUMMARY  Patient ID: Katrina Sutton MRN: [id number] DOB/AGE: 70-Dec-1952 70 y.o.  Admit date: 05/31/2018 Discharge date: 06/01/2018  Discharge Diagnoses Cholecystitis Umbilical Hernia  Consultants None  Procedures 05/31/2018:  1. Laparoscopic cholecystectomy 2. Open umbilical hernia repair without mesh  HPI: Katrina Sutton is a 68 y.o. female with a history of acute cholecystitis and umbilical hernia who presents to [facility] on 03/18 for laparoscopic cholecystectomy and umbilical hernia repair with Dr Lady Gary.   Hospital Course: Informed consent was obtained and documented, and patient underwent laparoscopic cholecystectomy and umbilical hernia repair with small primary enterotomy repair (Dr Lady Gary, 05/31/2018).  Post-operatively, patient's pain improved/resolved and advancement of patient's diet and ambulation were well-tolerated. The remainder of patient's hospital course was essentially unremarkable, and discharge planning was initiated accordingly with patient safely able to be discharged home with appropriate discharge instructions, pain control, and outpatient follow-up after all of her questions were answered to her expressed satisfaction.   Discharge Condition: Good   Physical Examination:  Constitutional: Well appearing female, NAD Pulmonary: No respiratory distress, normal effort Gastrointestinal: Soft, non-tender, non-distended Skin: Laparoscopic incisions are CDI, no erythema, mild ecchymosis around umbilical incision   Allergies as of 06/01/2018      Reactions   Erythromycin Shortness Of Breath, Rash   Duloxetine Palpitations      Medication List    TAKE these medications   acetaminophen 500 MG tablet Commonly known as:  TYLENOL Take 500 mg by mouth every 8 (eight) hours as needed for headache.   clonazePAM 1 MG tablet Commonly known as:  KLONOPIN Take 1 tablet (1 mg total) by mouth 2 (two) times daily as needed for  anxiety.   hydrocortisone cream 1 % Apply 1 application topically 2 (two) times daily as needed for itching.   loratadine 10 MG tablet Commonly known as:  CLARITIN Take 10 mg by mouth daily as needed for allergies.   omeprazole 40 MG capsule Commonly known as:  PRILOSEC Take 1 capsule (40 mg total) by mouth 2 (two) times daily. What changed:  when to take this   oxyCODONE 5 MG immediate release tablet Commonly known as:  Oxy IR/ROXICODONE Take 1 tablet (5 mg total) by mouth every 4 (four) hours as needed for severe pain or breakthrough pain.   PARoxetine 40 MG tablet Commonly known as:  PAXIL Take 1 tablet (40 mg total) by mouth daily.   polyethylene glycol packet Commonly known as:  MIRALAX / GLYCOLAX Take 17 g by mouth daily.   senna 8.6 MG Tabs tablet Commonly known as:  SENOKOT Take 1 tablet by mouth daily as needed for mild constipation.   sodium chloride 0.65 % Soln nasal spray Commonly known as:  OCEAN Place 1 spray into both nostrils as needed for congestion.   SYSTANE ULTRA OP Place 1 drop into both eyes 2 (two) times daily as needed (DRY EYES).        Follow-up Information    Duanne Guess, MD. Schedule an appointment as soon as possible for a visit in 2 week(s).   Specialty:  General Surgery Why:  S/p lap chole and umbilical hernia repair Contact information: [address] [place] [address] [phone]            -- Lynden Oxford , PA-C [facility]  06/01/2018, 10:02 AM [phone] M-F: 7am - 4pm   I saw and evaluated the patient.  I agree with the above documentation, exam, and plan, which  I have edited where appropriate. Duanne Guess  10:16 AM

## 2018-06-02 LAB — HIV ANTIBODY (ROUTINE TESTING W REFLEX): HIV Screen 4th Generation wRfx: NONREACTIVE

## 2018-06-14 ENCOUNTER — Encounter: Payer: Medicare HMO | Admitting: General Surgery

## 2018-06-21 ENCOUNTER — Ambulatory Visit (INDEPENDENT_AMBULATORY_CARE_PROVIDER_SITE_OTHER): Payer: Medicare HMO | Admitting: General Surgery

## 2018-06-21 ENCOUNTER — Other Ambulatory Visit: Payer: Self-pay

## 2018-06-21 DIAGNOSIS — Z9049 Acquired absence of other specified parts of digestive tract: Secondary | ICD-10-CM

## 2018-06-21 DIAGNOSIS — Z09 Encounter for follow-up examination after completed treatment for conditions other than malignant neoplasm: Secondary | ICD-10-CM | POA: Insufficient documentation

## 2018-06-21 NOTE — Progress Notes (Signed)
Virtual Visit via Telephone Note  I connected with Katrina Sutton on 06/21/18 at  2:00 PM EDT by telephone and verified that I am speaking with the correct person using two identifiers.   I discussed the limitations, risks, security and privacy concerns of performing an evaluation and management service by telephone and the availability of in person appointments. I also discussed with the patient that there may be a patient responsible charge related to this service. The patient expressed understanding and agreed to proceed.   History of Present Illness: Katrina Sutton is a 68 year old woman who underwent a combined laparoscopic cholecystectomy and umbilical hernia repair on 31 May 2018.  The umbilical hernia repair was complicated by a small enterotomy that was primarily repaired.  Despite this, she did well after surgery, tolerated a diet, and was discharged to home postoperative day 1.   Observations/Objective: Katrina Sutton states that she has been doing well since her operation.  She denies any nausea or vomiting.  No fevers or chills.  She does endorse some constipation.  She has been taking MiraLAX, but states that this causes a burning sensation in her abdomen in the area of the umbilical hernia repair.  She states that she does not have this a sensation if she does not take MiraLAX.  Her appetite is normal and she has resumed most of her usual activities.  She states that her incisions appear to be healing normally.  There were still some Steri-Strips in place.  I asked her to remove them.  She reported that the incision sites do not have any erythema, induration, or drainage present.  Assessment and Plan: Katrina Sutton is a 68 year old woman doing well after laparoscopic cholecystectomy and primary repair of an umbilical hernia.  I suggested that she try milk of magnesia or prune juice to alleviate her constipation.  She may resume all of her usual activities with the exception of lifting anything  heavier than 10 pounds for a total of 6 weeks after her operation.  Follow Up Instructions: This time, she has no ongoing general surgical needs.  If she has further concerns and prefers to be evaluated in person, she is welcome to contact us and we will make arrangements to facilitate that.   I discussed the assessment and treatment plan with the patient. The patient was provided an opportunity to ask questions and all were answered. The patient agreed with the plan and demonstrated an understanding of the instructions.   The patient was advised to call back or seek an in-person evaluation if the symptoms worsen or if the condition fails to improve as anticipated.  I provided 10 minutes of non-face-to-face time during this encounter.   Duanne Guess, MD

## 2018-07-20 ENCOUNTER — Telehealth: Payer: Self-pay | Admitting: *Deleted

## 2018-07-20 NOTE — Telephone Encounter (Signed)
Patient called the answering service on 07-19-18 at 4:14 pm. Patient sees Dr. Lady Gary. The patient states she had gallbladder and belly button hernia repaired on 05-31-18. She is having problems with constipation. She did what the doctor told her to do.   Lorraine Lax, RN with the answering service contacted the caller several times until someone responded, caller continued to hang up. Was informed by female who answered, client has already left and will not be back until next week. Informed gentleman this nurse was returning a call client had made. Gentleman reaffirmed previous statement, when questioned for another contact number, stated this was the correct and only number to be reached at. Caller then disconnected line. Informed charge nurse. Closed chart.

## 2018-08-31 DIAGNOSIS — H524 Presbyopia: Secondary | ICD-10-CM | POA: Diagnosis not present

## 2018-09-21 DIAGNOSIS — H6123 Impacted cerumen, bilateral: Secondary | ICD-10-CM | POA: Diagnosis not present

## 2018-09-21 DIAGNOSIS — H93299 Other abnormal auditory perceptions, unspecified ear: Secondary | ICD-10-CM | POA: Diagnosis not present

## 2018-09-21 DIAGNOSIS — H6062 Unspecified chronic otitis externa, left ear: Secondary | ICD-10-CM | POA: Diagnosis not present

## 2018-09-27 ENCOUNTER — Encounter: Payer: Self-pay | Admitting: Family Medicine

## 2018-09-27 ENCOUNTER — Ambulatory Visit (INDEPENDENT_AMBULATORY_CARE_PROVIDER_SITE_OTHER): Payer: Medicare HMO | Admitting: Family Medicine

## 2018-09-27 ENCOUNTER — Other Ambulatory Visit: Payer: Self-pay

## 2018-09-27 DIAGNOSIS — B372 Candidiasis of skin and nail: Secondary | ICD-10-CM

## 2018-09-27 MED ORDER — NYSTATIN 100000 UNIT/GM EX POWD: Freq: Three times a day (TID) | CUTANEOUS | 1 refills | Status: DC

## 2018-09-27 NOTE — Patient Instructions (Signed)
AVS given by phone. 

## 2018-09-27 NOTE — Progress Notes (Signed)
Virtual Visit via Telephone The purpose of this virtual visit is to provide medical care while limiting exposure to the novel coronavirus (COVID19) for both patient and office staff.  Consent was obtained for phone visit:  Yes.   Answered questions that patient had about telehealth interaction:  Yes.   I discussed the limitations, risks, security and privacy concerns of performing an evaluation and management service by telephone. I also discussed with the patient that there may be a patient responsible charge related to this service. The patient expressed understanding and agreed to proceed.  Patient Location: Home Provider Location: [facility] (Office)  PCP is Wilhelmina McardleLauren Kennedy, AGPCNP-BC - I am currently covering during her maternity leave.   ---------------------------------------------------------------------- Chief Complaint  Patient presents with  . Vaginal Pain    redness in between the folds of the skin between the legs and groin area. She denies discharge, itching.  x 1mth    S: Reviewed CMA documentation. I have called patient and gathered additional HPI as follows:  CANDIDAL INTERTRIGO Reported that new onset problem over 1 month gradual worsening red irritated skin near vaginal area and skin fold in groin/thigh. Itching and irritation. She says increased moisture worsen this problem, she wears cotton underwear and tries to keep dry, but not always successful. Describes small area 2 inches approx, no drainage or ulceration by her report. - Followed by ENT Dr Willeen CassBennett dx with ear dermatitis dry skin, given Clotrimazole-Betamethasone cream used in ear occasionally, has not used else where  Denies any fevers, chills, sweats, body ache, cough, shortness of breath, sinus pain or pressure, headache, abdominal pain, diarrhea  Past Medical History:  Diagnosis Date  . Allergy    seasonal  . Anxiety   . Arthritis    osteoarthritis  . Depression   . GERD  (gastroesophageal reflux disease)   . Hiatal hernia    Social History   Tobacco Use  . Smoking status: Former Smoker    Quit date: 12/27/1976    Years since quitting: 41.7  . Smokeless tobacco: Never Used  Substance Use Topics  . Alcohol use: No  . Drug use: No    Current Outpatient Medications:  .  acetaminophen (TYLENOL) 500 MG tablet, Take 500 mg by mouth every 8 (eight) hours as needed for headache., Disp: , Rfl:  .  clotrimazole-betamethasone (LOTRISONE) cream, , Disp: , Rfl:  .  hydrocortisone cream 1 %, Apply 1 application topically 2 (two) times daily as needed for itching., Disp: , Rfl:  .  omeprazole (PRILOSEC) 40 MG capsule, Take 1 capsule (40 mg total) by mouth 2 (two) times daily. (Patient taking differently: Take 40 mg by mouth daily. ), Disp: 180 capsule, Rfl: 1 .  PARoxetine (PAXIL) 40 MG tablet, Take 1 tablet (40 mg total) by mouth daily., Disp: 90 tablet, Rfl: 1 .  Polyethyl Glycol-Propyl Glycol (SYSTANE ULTRA OP), Place 1 drop into both eyes 2 (two) times daily as needed (DRY EYES)., Disp: , Rfl:  .  polyethylene glycol (MIRALAX / GLYCOLAX) packet, Take 17 g by mouth daily., Disp: , Rfl:  .  sodium chloride (OCEAN) 0.65 % SOLN nasal spray, Place 1 spray into both nostrils as needed for congestion., Disp: , Rfl:  .  clonazePAM (KLONOPIN) 1 MG tablet, Take 1 tablet (1 mg total) by mouth 2 (two) times daily as needed for anxiety., Disp: 60 tablet, Rfl: 5 .  loratadine (CLARITIN) 10 MG tablet, Take 10 mg by mouth daily as needed for allergies.,  Disp: , Rfl:  .  nystatin (MYCOSTATIN/NYSTOP) powder, Apply topically 3 (three) times daily. For about 2 weeks, may use up to 4 weeks if need, Disp: 15 g, Rfl: 1 .  oxyCODONE (OXY IR/ROXICODONE) 5 MG immediate release tablet, Take 1 tablet (5 mg total) by mouth every 4 (four) hours as needed for severe pain or breakthrough pain. (Patient not taking: Reported on 09/27/2018), Disp: 20 tablet, Rfl: 0 .  senna (SENOKOT) 8.6 MG TABS  tablet, Take 1 tablet by mouth daily as needed for mild constipation., Disp: , Rfl:   Depression screen [facility] 2/9 09/27/2018 05/02/2018 01/27/2018  Decreased Interest 0 0 0  Down, Depressed, Hopeless 0 0 1  PHQ - 2 Score 0 0 1  Altered sleeping 1 0 1  Tired, decreased energy 0 1 0  Change in appetite 1 1 1   Feeling bad or failure about yourself  0 0 0  Trouble concentrating 0 0 0  Moving slowly or fidgety/restless 0 1 0  Suicidal thoughts 0 0 0  PHQ-9 Score 2 3 3   Difficult doing work/chores Not difficult at all Not difficult at all Not difficult at all    GAD 7 : Generalized Anxiety Score 05/02/2018 01/27/2018 10/28/2017 08/23/2017  Nervous, Anxious, on Edge 1 1 2 3   Control/stop worrying 0 0 0 0  Worry too much - different things 2 0 1 1  Trouble relaxing 0 0 3 1  Restless 3 1 0 0  Easily annoyed or irritable 0 1 3 1   Afraid - awful might happen 0 0 0 1  Total GAD 7 Score 6 3 9 7   Anxiety Difficulty Not difficult at all Not difficult at all Not difficult at all Not difficult at all    -------------------------------------------------------------------------- O: No physical exam performed due to remote telephone encounter.  Lab results reviewed.  No results found for this or any previous visit (from the past 2160 hour(s)).  -------------------------------------------------------------------------- A&P:  Problem List Items Addressed This Visit    None    Visit Diagnoses    Candidal intertrigo    -  Primary   Relevant Medications   clotrimazole-betamethasone (LOTRISONE) cream   nystatin (MYCOSTATIN/NYSTOP) powder     Subacute flare x 1 month, groin skin fold described as mild to moderate due to moisture  Plan: 1. Start Nystatin powder TID for 2-4 weeks 2. May use existing Clotrimazole-Betamethasone cream from ENT - use PRN spot treatment after area starts to heal. Avoid use on sensitive skin 3. Keep dry, open to air some, avoid excessive sweating Follow-up 2-3 weeks if  not improved, consider alternative anti fungal topical or oral   Meds ordered this encounter  Medications  . nystatin (MYCOSTATIN/NYSTOP) powder    Sig: Apply topically 3 (three) times daily. For about 2 weeks, may use up to 4 weeks if need    Dispense:  15 g    Refill:  1    Follow-up: As needed w/ PCP  Patient verbalizes understanding with the above medical recommendations including the limitation of remote medical advice.  Specific follow-up and call-back criteria were given for patient to follow-up or seek medical care more urgently if needed.   - Time spent in direct consultation with patient on phone: 9 minutes   Nobie Putnam, [facility] 09/27/2018, 10:51 AM

## 2018-10-17 DIAGNOSIS — S93491D Sprain of other ligament of right ankle, subsequent encounter: Secondary | ICD-10-CM | POA: Diagnosis not present

## 2018-10-17 DIAGNOSIS — M1711 Unilateral primary osteoarthritis, right knee: Secondary | ICD-10-CM | POA: Diagnosis not present

## 2018-10-31 ENCOUNTER — Other Ambulatory Visit: Payer: Self-pay

## 2018-10-31 ENCOUNTER — Ambulatory Visit: Payer: Medicare HMO | Admitting: Nurse Practitioner

## 2018-11-01 ENCOUNTER — Other Ambulatory Visit: Payer: Self-pay

## 2018-11-01 ENCOUNTER — Encounter: Payer: Self-pay | Admitting: Nurse Practitioner

## 2018-11-01 ENCOUNTER — Ambulatory Visit (INDEPENDENT_AMBULATORY_CARE_PROVIDER_SITE_OTHER): Payer: Medicare HMO | Admitting: Nurse Practitioner

## 2018-11-01 VITALS — BP 137/83 | Temp 97.1°F

## 2018-11-01 DIAGNOSIS — F419 Anxiety disorder, unspecified: Secondary | ICD-10-CM | POA: Diagnosis not present

## 2018-11-01 DIAGNOSIS — K2100 Gastro-esophageal reflux disease with esophagitis, without bleeding: Secondary | ICD-10-CM

## 2018-11-01 DIAGNOSIS — F3289 Other specified depressive episodes: Secondary | ICD-10-CM | POA: Diagnosis not present

## 2018-11-01 DIAGNOSIS — F319 Bipolar disorder, unspecified: Secondary | ICD-10-CM

## 2018-11-01 MED ORDER — PAROXETINE HCL 40 MG PO TABS: 40.0000 mg | ORAL_TABLET | Freq: Every day | ORAL | 1 refills | Status: DC

## 2018-11-01 MED ORDER — OMEPRAZOLE 40 MG PO CPDR: 40.0000 mg | DELAYED_RELEASE_CAPSULE | Freq: Every day | ORAL | 1 refills | Status: DC

## 2018-11-01 MED ORDER — CLONAZEPAM 1 MG PO TABS: 1.0000 mg | ORAL_TABLET | Freq: Two times a day (BID) | ORAL | 5 refills | Status: DC | PRN

## 2018-11-01 NOTE — Progress Notes (Signed)
Telemedicine Encounter: Disclosed to patient at start of encounter that we will provide appropriate telemedicine services.  Patient consents to be treated via phone prior to discussion. - Patient is at her home and is accessed via telephone. - Services are provided by Cassell Smiles from [facility].  Subjective:    Patient ID: Katrina Sutton, female    DOB: [birth year], 67 y.o.   MRN: [id number]  Katrina Sutton is a 68 y.o. female presenting on 11/01/2018 for Anxiety (GERD. Pt had injection in Rt knee x 3 weeks ago for osteoarthritis.)  HPI Brief Interval UPDATES:  - Patient completed cholecystectomy and hernia repair - Patient notes she has gained weight (168) centrally.  - Osteoarthritis: Knee pain not improved with steroid injection   GERD Patient continues omeprazole 40 mg once daily.  No stomach pain or breakthrough heartburn. - She reports no n/v, coffee ground emesis, dark/black/tarry stool,  BRBPR, or other GI bleeding.   Anxiety and Depression Is a little higher due to COVID precautions, mask and staying home.  Dark, rainy, hot on other days.  Patient is working cross word puzzles, TV, Holiday representative out.  Talks with son and he is helpful.  Slight increase in family conflict due to staying home, but manageable.  Patient gets a little more angry, wanting to "holler out" patient can calm down, but this is troubling to patient and her husband.  To patient, this feels normal given situation but she is getting upset. Patient lives with husband and son who also have mental health disorders and she feels she has to help them cope as well.  Patient noted past improvement with therapy.  Desires again to help with managing situation.    GAD 7 : Generalized Anxiety Score 11/01/2018 05/02/2018 01/27/2018 10/28/2017  Nervous, Anxious, on Edge 1 1 1 2   Control/stop worrying 0 0 0 0  Worry too much - different things 1 2 0 1  Trouble relaxing 1 0 0 3  Restless 0 3 1 0  Easily annoyed or  irritable 2 0 1 3  Afraid - awful might happen 0 0 0 0  Total GAD 7 Score 5 6 3 9   Anxiety Difficulty Not difficult at all Not difficult at all Not difficult at all Not difficult at all     Depression screen [facility] 2/9 11/01/2018 09/27/2018 05/02/2018 01/27/2018 10/28/2017  Decreased Interest 0 0 0 0 0  Down, Depressed, Hopeless 1 0 0 1 0  PHQ - 2 Score 1 0 0 1 0  Altered sleeping 2 1 0 1 1  Tired, decreased energy 1 0 1 0 3  Change in appetite 2 1 1 1 1   Feeling bad or failure about yourself  0 0 0 0 0  Trouble concentrating 1 0 0 0 0  Moving slowly or fidgety/restless 2 0 1 0 0  Suicidal thoughts 0 0 0 0 0  PHQ-9 Score 9 2 3 3 5   Difficult doing work/chores Not difficult at all Not difficult at all Not difficult at all Not difficult at all Not difficult at all  Some recent data might be hidden    Social History   Tobacco Use  . Smoking status: Former Smoker    Quit date: 12/27/1976    Years since quitting: 41.8  . Smokeless tobacco: Never Used  Substance Use Topics  . Alcohol use: No  . Drug use: No    Review of Systems Per HPI unless specifically indicated above  Objective:    BP 137/83   Temp (!) 97.1 F (36.2 C) (Oral)   Wt Readings from Last 3 Encounters:  05/26/18 161 lb 1 oz (73.1 kg)  05/17/18 162 lb 3.2 oz (73.6 kg)  05/02/18 162 lb 3.2 oz (73.6 kg)    Physical Exam Patient remotely monitored.  Verbal communication appropriate.  Cognition normal.   Results for orders placed or performed during the hospital encounter of 05/31/18  HIV antibody (Routine Testing)  Result Value Ref Range   HIV Screen 4th Generation wRfx Non Reactive Non Reactive  Basic metabolic panel  Result Value Ref Range   Sodium 141 135 - 145 mmol/L   Potassium 4.1 3.5 - 5.1 mmol/L   Chloride 108 98 - 111 mmol/L   CO2 26 22 - 32 mmol/L   Glucose, Bld 142 (H) 70 - 99 mg/dL   BUN 14 8 - 23 mg/dL   Creatinine, Ser 4.780.82 0.44 - 1.00 mg/dL   Calcium 8.6 (L) 8.9 - 10.3 mg/dL   GFR calc  non Af Amer >60 >60 mL/min   GFR calc Af Amer >60 >60 mL/min   Anion gap 7 5 - 15  Magnesium  Result Value Ref Range   Magnesium 2.3 1.7 - 2.4 mg/dL  Phosphorus  Result Value Ref Range   Phosphorus 3.4 2.5 - 4.6 mg/dL  CBC  Result Value Ref Range   WBC 16.2 (H) 4.0 - 10.5 K/uL   RBC 4.23 3.87 - 5.11 MIL/uL   Hemoglobin 12.7 12.0 - 15.0 g/dL   HCT 29.537.9 62.136.0 - 30.846.0 %   MCV 89.6 80.0 - 100.0 fL   MCH 30.0 26.0 - 34.0 pg   MCHC 33.5 30.0 - 36.0 g/dL   RDW 65.713.2 84.611.5 - 96.215.5 %   Platelets 217 150 - 400 K/uL   nRBC 0.0 0.0 - 0.2 %  Surgical pathology  Result Value Ref Range   SURGICAL PATHOLOGY      Surgical Pathology CASE: [id number] PATIENT: Winna Semper Surgical Pathology Report     SPECIMEN SUBMITTED: A. Gallbladder  CLINICAL HISTORY: None provided  PRE-OPERATIVE DIAGNOSIS: Gallstones K80.20, umbilical hernia K42.9  POST-OPERATIVE DIAGNOSIS: Same as pre op     DIAGNOSIS: A.  GALLBLADDER; CHOLECYSTECTOMY: - CHRONIC CHOLECYSTITIS AND CHOLELITHIASIS.   GROSS DESCRIPTION: A. Labeled: Gallbladder Received: Formalin Size of specimen: 10.5 x 3.9 x 1.8 cm Specimen integrity: Intact External surface: Tan-green, smooth, and finely vascular with multifocal areas of attached adipose tissue Wall thickness: 0.1 cm (uniform) Mucosa: Tan and velvety.  No polyps or abnormalities are grossly identified. Cystic duct: 0.3 cm in length by 0.2 cm in diameter.  Patent.  No cystic duct lymph node is grossly identified. Bile present: Yes; yellow-green. Stones present: Yes; multiple black, friable stones (8.5 x 4.0 x 0.7 cm in aggregate) are present within the  lumen. Other findings: None grossly identified  Block summary: 1 - cystic duct surgical resection margin (inked blue, en face) and gallbladder wall    Final Diagnosis performed by Redmond PullingErnest Andree, MD.   Electronically signed 06/01/2018 2:24:48PM The electronic signature indicates that the named Attending  Pathologist has evaluated the specimen  Technical component performed at [facility], [address], [place], [place] [address] Lab: [phone] Dir: Jolene SchimkeSanjai Nagendra, MD, MMM  Professional component performed at [facility], [facility], [address], [place], [place] [address] Lab: [phone] Dir: Georgiann Cockerara C. Oneita Krasubinas, MD       Assessment & Plan:   Problem List Items Addressed This Visit  Digestive   GERD (gastroesophageal reflux disease) - Primary   Relevant Medications   omeprazole (PRILOSEC) 40 MG capsule     Other   Anxiety   Relevant Medications   PARoxetine (PAXIL) 40 MG tablet   clonazePAM (KLONOPIN) 1 MG tablet   Other Relevant Orders   Ambulatory referral to Social Work   Depression   Relevant Medications   PARoxetine (PAXIL) 40 MG tablet   Other Relevant Orders   Ambulatory referral to Social Work   Bipolar 1 disorder [facility](HCC)   Relevant Orders   Ambulatory referral to Social Work      GERD currently well controlled on omeprazole 40 mg once daily.   Plan: 1. Continue omeprazole 40 mg once daily. Side effects discussed. Pt wants to continue med. 2. Avoid diet triggers. Reviewed need to seek care if globus sensation, difficulty swallowing, s/sx of GI bleed. 3. Follow up as needed and in 6 months.    Anxiety, Depression, Bipolar 1 Patient noting decreased usefulness of coping skills as pandemic/stay home practices continue for extended period of time.  Patient caregiver role strain complicates symptoms.  PHQ9 mildly worsening and GAD7 generally stable.    Plan: 1. Continue paroxetine 40 mg daily 2. Continue clonazepam 1 mg bid prn anxiety  3. Referral placed to LCSW for counseling. 4. May need to consider referral to psychiatry given complex mental health history despite recent past stable disease. 5. Follow-up 6-8 weeks prn worsening/not improving and in 6 months.  Meds ordered this encounter  Medications  . PARoxetine (PAXIL) 40 MG tablet     Sig: Take 1 tablet (40 mg total) by mouth daily.    Dispense:  90 tablet    Refill:  1    Order Specific Question:   Supervising Provider    Answer:   Smitty CordsKARAMALEGOS, ALEXANDER J [[id number]]  . clonazePAM (KLONOPIN) 1 MG tablet    Sig: Take 1 tablet (1 mg total) by mouth 2 (two) times daily as needed for anxiety.    Dispense:  60 tablet    Refill:  5    Order Specific Question:   Supervising Provider    Answer:   Smitty CordsKARAMALEGOS, ALEXANDER J [[id number]]  . omeprazole (PRILOSEC) 40 MG capsule    Sig: Take 1 capsule (40 mg total) by mouth daily.    Dispense:  90 capsule    Refill:  1    Order Specific Question:   Supervising Provider    Answer:   Smitty CordsKARAMALEGOS, ALEXANDER J [[id number]]    - Time spent in direct consultation with patient via telemedicine about above concerns: 10 minutes  Follow up plan: Return in about 6 months (around 05/04/2019) for anxiety, GERD.  Wilhelmina McardleLauren Cristan Scherzer, DNP, AGPCNP-BC Adult Gerontology Primary Care Nurse Practitioner [facility] 11/01/2018, 2:32 PM

## 2018-11-02 ENCOUNTER — Telehealth: Payer: Self-pay | Admitting: Nurse Practitioner

## 2018-11-02 NOTE — Chronic Care Management (AMB) (Signed)
Chronic Care Management   Note  11/02/2018 Name: Evelynn JACY BROCKER MRN: [id number] DOB: 1950-04-07  Arleigh T Craker is a 68 y.o. year old female who is a primary care patient of Mikey College, NP. I reached out to Malky T Defenbaugh by phone today in response to a referral sent by Ms. Amandalynn T [place].    Ms. Bachmeier was given information about Chronic Care Management services today including:  1. CCM service includes personalized support from designated clinical staff supervised by her physician, including individualized plan of care and coordination with other care providers 2. 24/7 contact phone numbers for assistance for urgent and routine care needs. 3. Service will only be billed when office clinical staff spend 20 minutes or more in a month to coordinate care. 4. Only one practitioner may furnish and bill the service in a calendar month. 5. The patient may stop CCM services at any time (effective at the end of the month) by phone call to the office staff. 6. The patient will be responsible for cost sharing (co-pay) of up to 20% of the service fee (after annual deductible is met).  Patient agreed to services and verbal consent obtained.   Follow up plan: Telephone appointment with CCM team member scheduled for: 11/01/2018  [place]  ??[email or web address]   ??[phone]

## 2018-11-08 ENCOUNTER — Encounter: Payer: Self-pay | Admitting: Nurse Practitioner

## 2018-11-22 ENCOUNTER — Telehealth: Payer: Self-pay | Admitting: Nurse Practitioner

## 2018-11-22 NOTE — Telephone Encounter (Signed)
Pt wanted to know if she can use Metamucil. Pt call back # is [phone]

## 2018-11-23 NOTE — Telephone Encounter (Signed)
Yes.  Patient may use metamucil daily as needed.

## 2018-11-23 NOTE — Telephone Encounter (Signed)
Attempted to contact the pt, no answer. LMOM to return my call.  

## 2018-11-24 NOTE — Telephone Encounter (Signed)
Attempted to contact the pt, no answer. LMOM to return my call.  

## 2018-11-27 ENCOUNTER — Ambulatory Visit (INDEPENDENT_AMBULATORY_CARE_PROVIDER_SITE_OTHER): Payer: Medicare HMO | Admitting: *Deleted

## 2018-11-27 DIAGNOSIS — F3289 Other specified depressive episodes: Secondary | ICD-10-CM

## 2018-11-27 DIAGNOSIS — F319 Bipolar disorder, unspecified: Secondary | ICD-10-CM | POA: Diagnosis not present

## 2018-11-27 DIAGNOSIS — F419 Anxiety disorder, unspecified: Secondary | ICD-10-CM | POA: Diagnosis not present

## 2018-11-27 NOTE — Telephone Encounter (Signed)
The pt was notified. No questions or concerns. 

## 2018-11-27 NOTE — Patient Instructions (Signed)
Thank you allowing the Chronic Care Management Team to be a part of your care! It was a pleasure speaking with you today!   CCM (Chronic Care Management) Team   Cimberly Stoffel RN, BSN Nurse Care Coordinator  [phone]  Harlow Asa PharmD  Clinical Pharmacist  [phone]  Eula Fried LCSW Clinical Social Worker [phone]  Goals Addressed            This Visit's Progress   . RN- I would like to talk with the SW about my stress (pt-stated)       Current Barriers:  . Chronic Disease Management support and education needs related to anxiety and depression  Nurse Case Manager Clinical Goal(s):  Marland Kitchen Over the next 30 days, patient will work with CM clinical social worker to develop ways to manage stress and anxiety  Interventions:  . Discussed plans with patient for ongoing care management follow up and provided patient with direct contact information for care management team . Social Work referral for anxiety and depression . Discussed with patient her home life and support system.  . Patient stated she was put out of work on disability in 2002 for mental health reasons.  . Patient stated she has one son with learning disabilities (43y/o)who lives in the home  . Discussed patient's physical health which patient reports as very good, she did report a fall in February where she broke her nose. Patient denies the need for a cane or walker, denies unsteadiness.   Patient Self Care Activities:  . Currently UNABLE TO independently manage stress and anxiety.   Initial goal documentation        The patient verbalized understanding of instructions provided today and declined a print copy of patient instruction materials.   The patient has been provided with contact information for the care management team and has been advised to call with any health related questions or concerns.

## 2018-11-27 NOTE — Chronic Care Management (AMB) (Signed)
  Chronic Care Management   Initial Visit Note  11/27/2018 Name: Katrina Sutton MRN: [id number] DOB: [birth year]  Referred by: Mikey College, NP Reason for referral : Chronic Care Management (Initial RNCM Outreach to intro CCM services )   Katrina Sutton is a 68 y.o. year old female who is a primary care patient of Mikey College, NP. The CCM team was consulted for assistance with chronic disease management and care coordination needs.   Review of patient status, including review of consultants reports, relevant laboratory and other test results, and collaboration with appropriate care team members and the patient's provider was performed as part of comprehensive patient evaluation and provision of chronic care management services.    SDOH (Social Determinants of Health) screening performed today. See Care Plan Entry related to challenges with: Depression   Stress  Objective:   Goals Addressed            This Visit's Progress   . I would like to talk with the SW about my stress (pt-stated)       Current Barriers:  . Chronic Disease Management support and education needs related to anxiety and depression  Nurse Case Manager Clinical Goal(s):  Marland Kitchen Over the next 30 days, patient will work with CM clinical social worker to develop   Interventions:  . Discussed plans with patient for ongoing care management follow up and provided patient with direct contact information for care management team . Social Work referral for anxiety and depression  Patient Self Care Activities:  . Currently UNABLE TO independently    Initial goal documentation         Plan:   The care management team will reach out to the patient again over the next 30 days.  The patient has been provided with contact information for the care management team and has been advised to call with any health related questions or concerns.   Merlene Morse Aanshi Batchelder RN, BSN Nurse Case Pharmacist, [facility]  Care Management  [phone]) Business Mobile

## 2018-11-29 ENCOUNTER — Telehealth: Payer: Self-pay

## 2018-11-29 ENCOUNTER — Ambulatory Visit: Payer: Self-pay | Admitting: Licensed Clinical Social Worker

## 2018-11-29 DIAGNOSIS — K2100 Gastro-esophageal reflux disease with esophagitis, without bleeding: Secondary | ICD-10-CM

## 2018-11-29 DIAGNOSIS — F419 Anxiety disorder, unspecified: Secondary | ICD-10-CM

## 2018-11-29 MED ORDER — OMEPRAZOLE 20 MG PO CPDR: 20.0000 mg | DELAYED_RELEASE_CAPSULE | Freq: Two times a day (BID) | ORAL | 1 refills | Status: DC

## 2018-11-29 NOTE — Telephone Encounter (Signed)
The pt was notified. She verbalize understanding, no questions or concerns. 

## 2018-11-29 NOTE — Chronic Care Management (AMB) (Signed)
  Chronic Care Management    Clinical Social Work General Note  11/29/2018 Name: Katrina Sutton MRN: [id number] DOB: [birth year]  Katrina Sutton is a 68 y.o. year old female who is a primary care patient of Mikey College, NP. The CCM was consulted to assist the patient with Mental Health Counseling and Resources.   Ms. Schweppe was given information about Chronic Care Management services today including:  1. CCM service includes personalized support from designated clinical staff supervised by her physician, including individualized plan of care and coordination with other care providers 2. 24/7 contact phone numbers for assistance for urgent and routine care needs. 3. Service will only be billed when office clinical staff spend 20 minutes or more in a month to coordinate care. 4. Only one practitioner may furnish and bill the service in a calendar month. 5. The patient may stop CCM services at any time (effective at the end of the month) by phone call to the office staff. 6. The patient will be responsible for cost sharing (co-pay) of up to 20% of the service fee (after annual deductible is met).  Patient agreed to services and verbal consent obtained.   Review of patient status, including review of consultants reports, relevant laboratory and other test results, and collaboration with appropriate care team members and the patient's provider was performed as part of comprehensive patient evaluation and provision of chronic care management services.    Outpatient Encounter Medications as of 11/29/2018  Medication Sig  . acetaminophen (TYLENOL) 500 MG tablet Take 500 mg by mouth every 8 (eight) hours as needed for headache.  . clonazePAM (KLONOPIN) 1 MG tablet Take 1 tablet (1 mg total) by mouth 2 (two) times daily as needed for anxiety.  . clotrimazole-betamethasone (LOTRISONE) cream   . DHA-EPA-Flaxseed Oil-Vitamin E (THERA TEARS NUTRITION PO) Take by mouth.  . hydrocortisone cream 1 % Apply 1  application topically 2 (two) times daily as needed for itching.  . loratadine (CLARITIN) 10 MG tablet Take 10 mg by mouth daily as needed for allergies.  Marland Kitchen nystatin (MYCOSTATIN/NYSTOP) powder Apply topically 3 (three) times daily. For about 2 weeks, may use up to 4 weeks if need (Patient not taking: Reported on 11/01/2018)  . omeprazole (PRILOSEC) 20 MG capsule Take 1 capsule (20 mg total) by mouth 2 (two) times daily.  Marland Kitchen PARoxetine (PAXIL) 40 MG tablet Take 1 tablet (40 mg total) by mouth daily.  . polyethylene glycol (MIRALAX / GLYCOLAX) packet Take 17 g by mouth daily.  Marland Kitchen senna (SENOKOT) 8.6 MG TABS tablet Take 1 tablet by mouth daily as needed for mild constipation.  . sodium chloride (OCEAN) 0.65 % SOLN nasal spray Place 1 spray into both nostrils as needed for congestion.   No facility-administered encounter medications on file as of 11/29/2018.    LCSW completed initial outreach to patient and introduced CCM Social Work Program. Patient is agreeable to services and LCSW successfully scheduled initial appointment with patient for 12/21/2018.   Follow Up Plan: SW will follow up with patient by phone over the next 30 days      Eula Fried, [employer], MSW, [email or web address] Phone: [phone]

## 2018-11-29 NOTE — Telephone Encounter (Signed)
The pt called and left on the voicemail that you changed her Prilosec from 40MG  twice daily to once daily. She complains of breakthrough heartburn and would like to go back on the 40 MG twice daily. Please advise

## 2018-11-29 NOTE — Telephone Encounter (Signed)
I have changed her prescription to try 20 mg twice daily.  If this change is not effective, we will resume her higher dose.

## 2018-12-21 ENCOUNTER — Ambulatory Visit: Payer: Self-pay | Admitting: Licensed Clinical Social Worker

## 2018-12-21 DIAGNOSIS — F419 Anxiety disorder, unspecified: Secondary | ICD-10-CM

## 2018-12-21 DIAGNOSIS — F319 Bipolar disorder, unspecified: Secondary | ICD-10-CM

## 2018-12-21 NOTE — Chronic Care Management (AMB) (Signed)
Chronic Care Management    Clinical Social Work Follow Up Note  12/21/2018 Name: Katrina Sutton MRN: [id number] DOB: 05/03/1950  Katrina Sutton is a 68 y.o. year old female who is a primary care patient of Mikey College, NP. The CCM team was consulted for assistance with Mental Health Counseling and Resources.   Review of patient status, including review of consultants reports, other relevant assessments, and collaboration with appropriate care team members and the patient's provider was performed as part of comprehensive patient evaluation and provision of chronic care management services.    SDOH (Social Determinants of Health) screening performed today: Depression   Stress. See Care Plan for related entries.   Outpatient Encounter Medications as of 12/21/2018  Medication Sig  . acetaminophen (TYLENOL) 500 MG tablet Take 500 mg by mouth every 8 (eight) hours as needed for headache.  . clonazePAM (KLONOPIN) 1 MG tablet Take 1 tablet (1 mg total) by mouth 2 (two) times daily as needed for anxiety.  . clotrimazole-betamethasone (LOTRISONE) cream   . DHA-EPA-Flaxseed Oil-Vitamin E (THERA TEARS NUTRITION PO) Take by mouth.  . hydrocortisone cream 1 % Apply 1 application topically 2 (two) times daily as needed for itching.  . loratadine (CLARITIN) 10 MG tablet Take 10 mg by mouth daily as needed for allergies.  Marland Kitchen nystatin (MYCOSTATIN/NYSTOP) powder Apply topically 3 (three) times daily. For about 2 weeks, may use up to 4 weeks if need (Patient not taking: Reported on 11/01/2018)  . omeprazole (PRILOSEC) 20 MG capsule Take 1 capsule (20 mg total) by mouth 2 (two) times daily.  Marland Kitchen PARoxetine (PAXIL) 40 MG tablet Take 1 tablet (40 mg total) by mouth daily.  . polyethylene glycol (MIRALAX / GLYCOLAX) packet Take 17 g by mouth daily.  Marland Kitchen senna (SENOKOT) 8.6 MG TABS tablet Take 1 tablet by mouth daily as needed for mild constipation.  . sodium chloride (OCEAN) 0.65 % SOLN nasal spray Place 1 spray into  both nostrils as needed for congestion.   No facility-administered encounter medications on file as of 12/21/2018.      Goals Addressed    . SW- I would like to gain additional support to manage my anxiety better (pt-stated)       Current Barriers:  . Limited social support . Mental Health Concerns  . Social Isolation . Limited access to caregiver . Lacks knowledge of community resource: available mental health resources that she can utilize   Clinical Social Work Clinical Goal(s):  Marland Kitchen Over the next 120 days, client will work with SW to address concerns related to decreasing anxious symptoms by implementing additional mental health support and coping skills into her routine.   Interventions: . Patient interviewed and appropriate assessments performed . Provided mental health counseling with regard to patient was emotional throughout phone call due to her daily difficulty with mobility/weakness/balance. LCSW used active and reflective listening and implemented appropriate interventions to help suppport patient and her emotional needs. Advised patient to implement deep breathing/grounding/meditation/self-care exercises into her daily routine to combat stressors. Education provided.  . Discussed plans with patient for ongoing care management follow up and provided patient with direct contact information for care management team . Advised patient to implement deep breathing exercises into her routine whenever anxiety/stress arise to help bring her back to the present state.  . Assisted patient/caregiver with obtaining information about health plan benefits . Provided education and assistance to client regarding Advanced Directives. . Provided education to patient/caregiver regarding level of care options. Marland Kitchen  Referred patient to [facility] (mental health provider) for long term follow up and therapy/counseling. PCP made a referral in August but Corinda Gubler was never able to establish  contact with patient after 3 unsuccessfully calls. LCSW provided patient with their contact information and encouraged her to contact them.   Patient Self Care Activities:  . Attends all scheduled provider appointments . Lacks social connections  Initial goal documentation     Follow Up Plan: SW will follow up with patient by phone over the next 30-45 days  Dickie La, [employer], MSW, LCSW [facility]  [facility] [email or web address] Phone: [phone]

## 2019-01-02 ENCOUNTER — Ambulatory Visit: Payer: Medicare HMO | Admitting: Nurse Practitioner

## 2019-01-15 ENCOUNTER — Telehealth: Payer: Self-pay

## 2019-01-23 DIAGNOSIS — M1711 Unilateral primary osteoarthritis, right knee: Secondary | ICD-10-CM | POA: Diagnosis not present

## 2019-01-23 DIAGNOSIS — M25579 Pain in unspecified ankle and joints of unspecified foot: Secondary | ICD-10-CM | POA: Diagnosis not present

## 2019-01-23 DIAGNOSIS — S93491D Sprain of other ligament of right ankle, subsequent encounter: Secondary | ICD-10-CM | POA: Diagnosis not present

## 2019-01-25 ENCOUNTER — Ambulatory Visit: Payer: Self-pay | Admitting: Licensed Clinical Social Worker

## 2019-01-25 ENCOUNTER — Telehealth: Payer: Self-pay

## 2019-01-25 NOTE — Chronic Care Management (AMB) (Signed)
  Care Management   Follow Up Note   01/25/2019 Name: Katrina Sutton MRN: [id number] DOB: 02-Dec-1950  Referred by: Mikey College, NP (Inactive) Reason for referral : Care Coordination   Katrina Sutton is a 68 y.o. year old female who is a primary care patient of Mikey College, NP (Inactive). The care management team was consulted for assistance with care management and care coordination needs.    Review of patient status, including review of consultants reports, relevant laboratory and other test results, and collaboration with appropriate care team members and the patient's provider was performed as part of comprehensive patient evaluation and provision of chronic care management services.    LCSW completed CCM outreach attempt today but was unable to reach patient successfully. A HIPPA compliant voice message was left encouraging patient to return call once available. LCSW rescheduled CCM SW appointment as well.  A HIPPA compliant phone message was left for the patient providing contact information and requesting a return call.   Eula Fried, BSW, MSW, [email or web address] Phone: [phone]

## 2019-02-12 ENCOUNTER — Telehealth: Payer: Self-pay

## 2019-02-13 ENCOUNTER — Ambulatory Visit: Payer: Medicare HMO | Admitting: Psychology

## 2019-02-15 ENCOUNTER — Telehealth: Payer: Self-pay

## 2019-02-15 DIAGNOSIS — B372 Candidiasis of skin and nail: Secondary | ICD-10-CM

## 2019-02-15 MED ORDER — NYSTATIN 100000 UNIT/GM EX POWD: Freq: Three times a day (TID) | CUTANEOUS | 1 refills | Status: DC

## 2019-02-15 NOTE — Telephone Encounter (Signed)
New rx sent 30g

## 2019-02-15 NOTE — Telephone Encounter (Signed)
The pharmacist from [place] called requesting if we can send over a new prescription for the Nystatin powder for 30g instead of 15g, because the patient is trying to refill and they don't have the 15g in stock.

## 2019-02-27 IMAGING — CT CT MAXILLOFACIAL W/O CM
4 of 6 series · 15 of 47 positions shown, 17 images · non-contrast
Comparison: None.

CLINICAL DATA: Pt to ED via POV, pt states that she was going into
[facility] and fell, hitting her face on the ground. Pt has a
laceration to the forehead and nose, swelling to the nose. Pt states
that it feels like her nose is stopped up. Pt denies use of blood
thinners. No loss of consciousness.

EXAM:
CT HEAD WITHOUT CONTRAST
CT MAXILLOFACIAL WITHOUT CONTRAST
TECHNIQUE: Multidetector CT imaging of the head and maxillofacial structures
were performed using the standard protocol without intravenous
contrast. Multiplanar CT image reconstructions of the maxillofacial
structures were also generated.

[Series 2: head wo · axial · 0.47mm/px · z∈[-123,-23]mm · 6 of 30 slices shown, 8 images]
[im 5/30  brain]
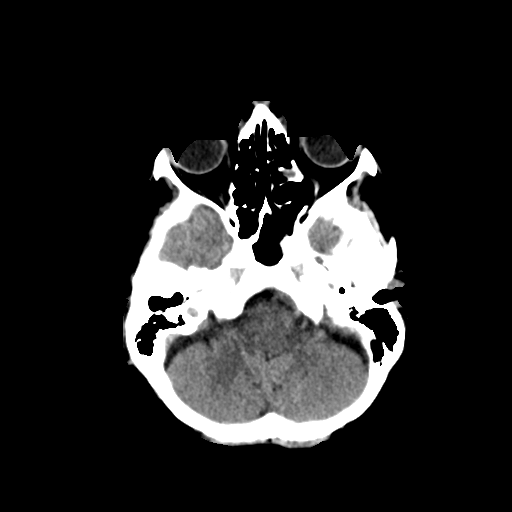
[im 5/30  bone]
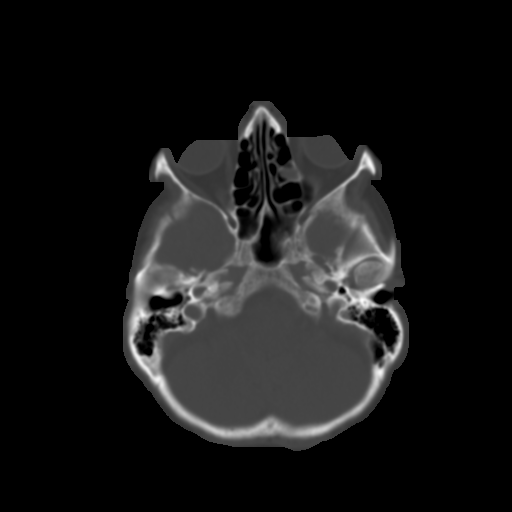
[im 9/30  bone]
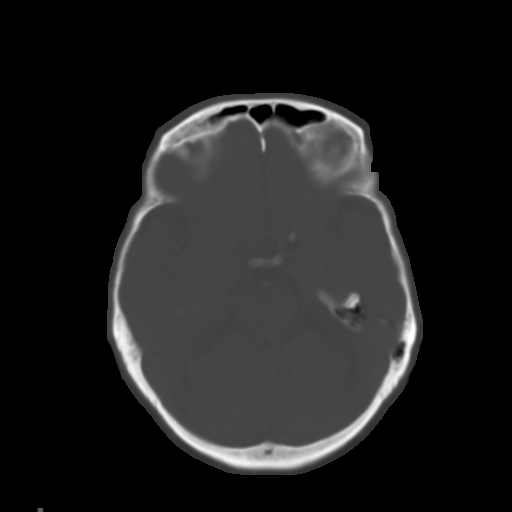
[im 13/30  bone]
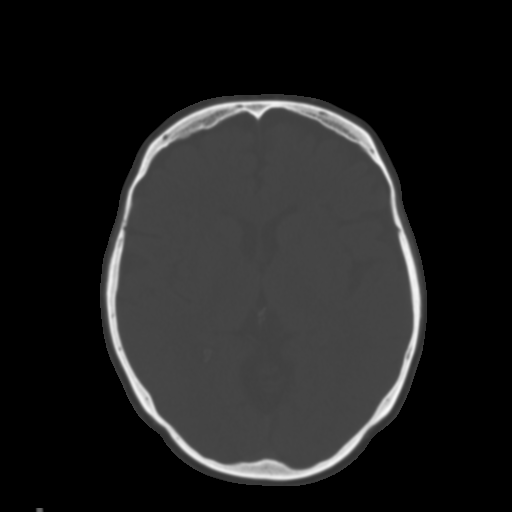
[im 17/30  bone]
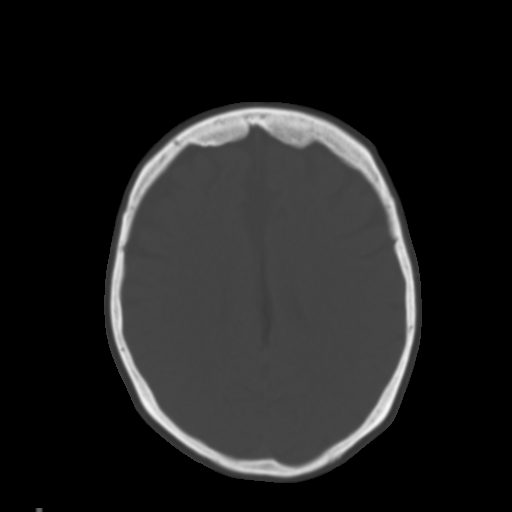
[im 21/30  brain]
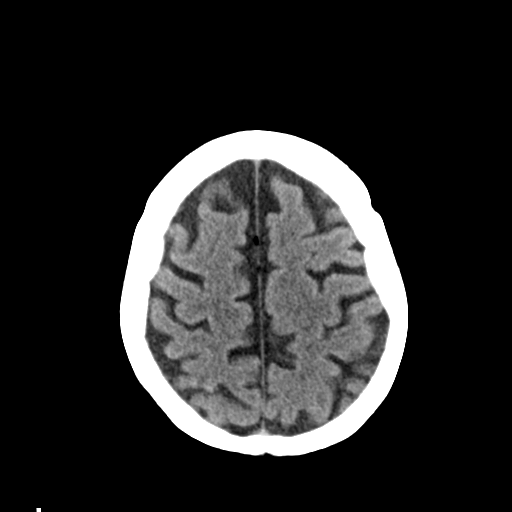
[im 21/30  bone]
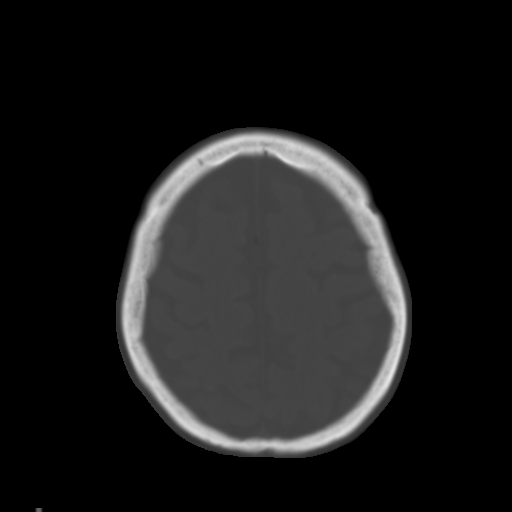
[im 25/30  bone]
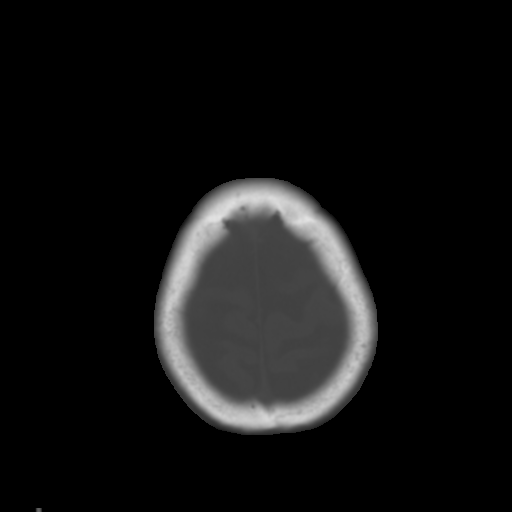

[Series 4: coronal soft tissue · coronal · 0.29mm/px · 3 of 67 slices shown]
[im 17/67  bone]
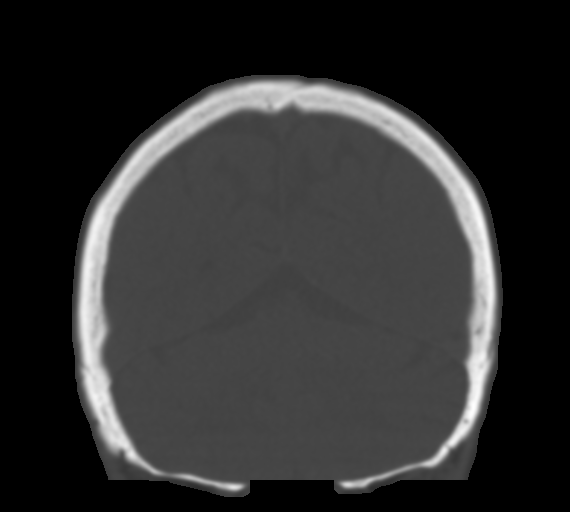
[im 23/67  bone]
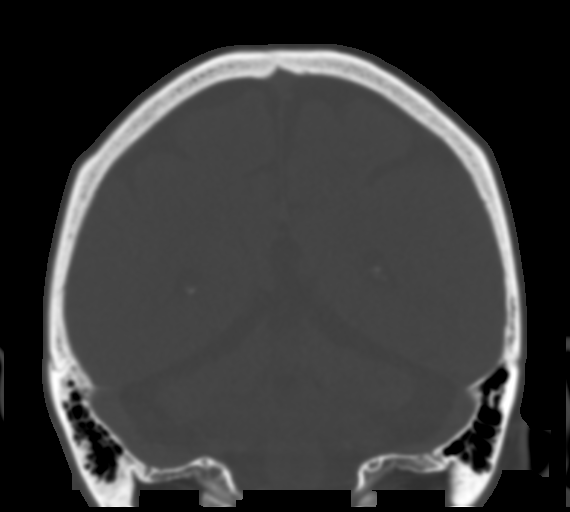
[im 28/67  bone]
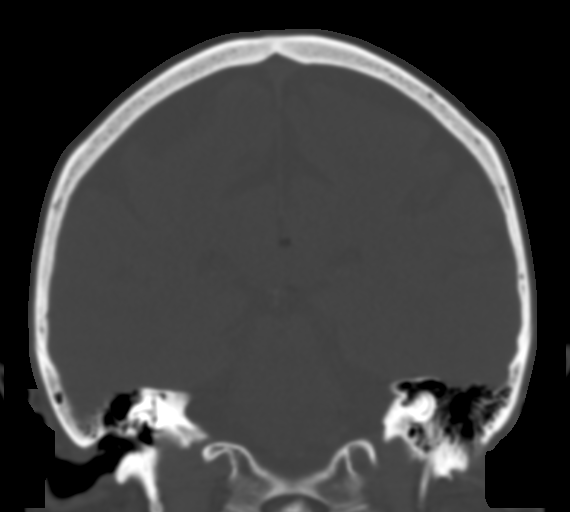

[Series 6: max soft · axial · 0.31mm/px · z∈[-234,-182]mm · 4 of 78 slices shown]
[im 8/78  brain]
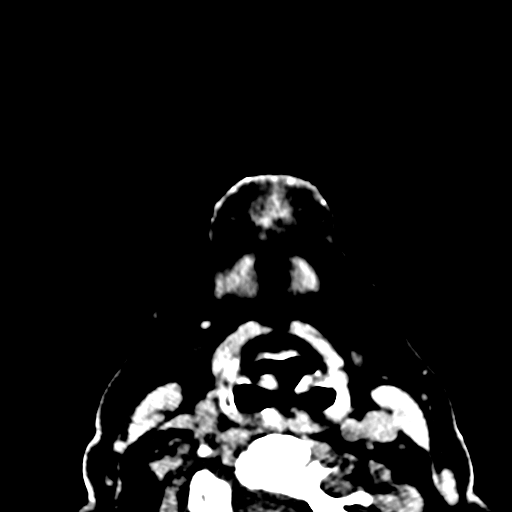
[im 15/78  brain]
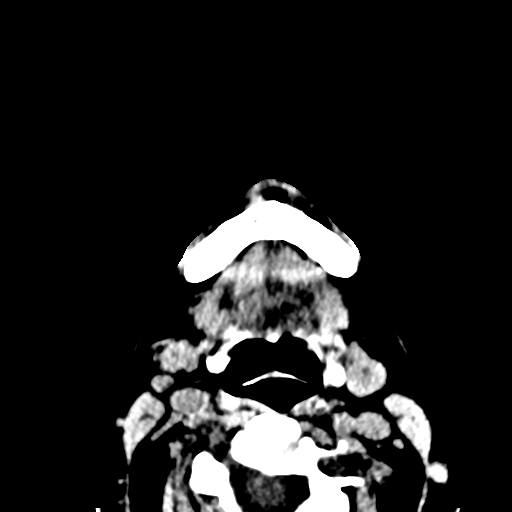
[im 26/78  brain]
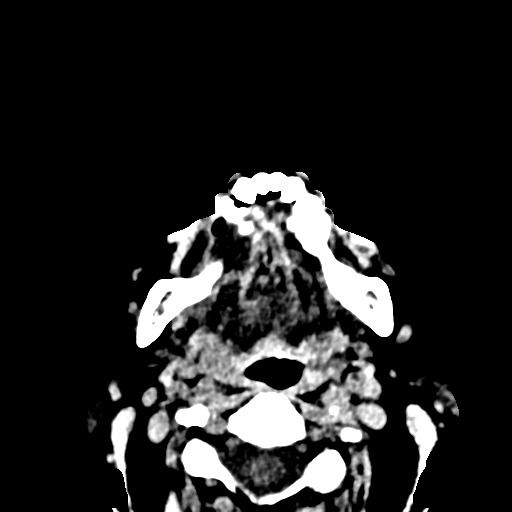
[im 34/78  brain]
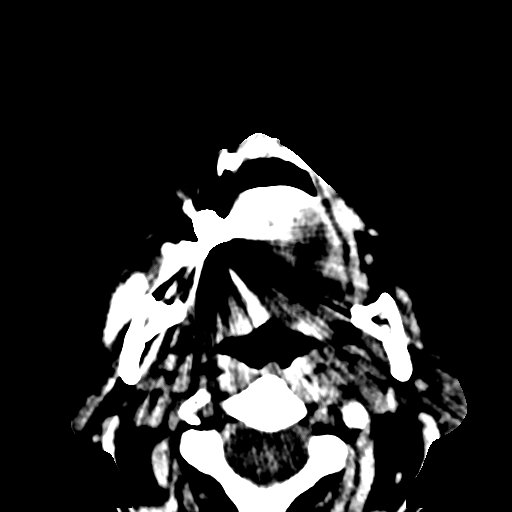

[Series 9: sagittal soft · sagittal · 0.23mm/px · 2 of 87 slices shown]
[im 29/87  bone]
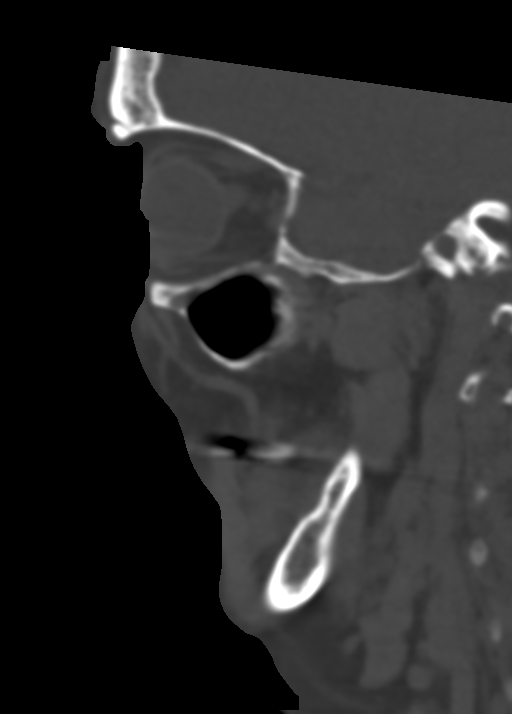
[im 58/87  bone]
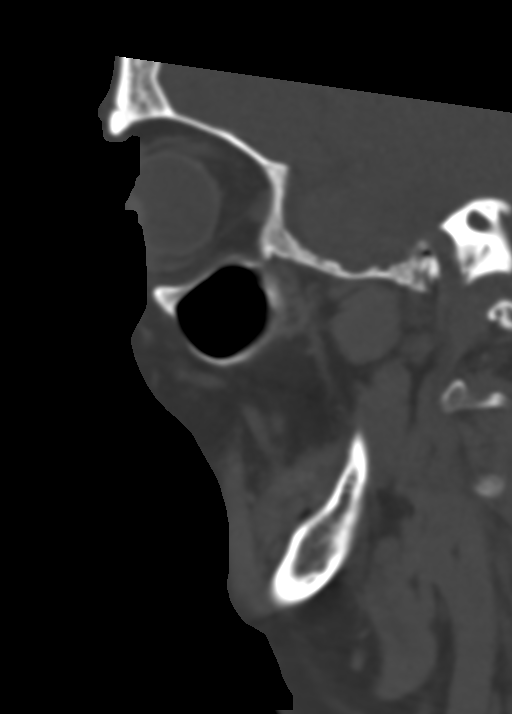

[15 of 47 positions shown; findings below may reference images not displayed]

FINDINGS: CT HEAD FINDINGS

Brain: No evidence of acute infarction, hemorrhage, hydrocephalus,
extra-axial collection or mass lesion/mass effect.

There is fat adjacent to the corpus callosum with another small
focus of fat along the interhemispheric fissure, reflecting a lipoma
from persistence of the meninx primitiva, a developmental variant.
Corpus callosum appears normally formed.

Vascular: No hyperdense vessel or unexpected calcification.

Skull: Normal. Negative for fracture or focal lesion.

Other: None.

CT MAXILLOFACIAL FINDINGS

Osseous: There are bilateral nasal fractures. Minimally displaced on
the right approximately 3 mm, nondisplaced on the left. No nasal
depression and no significant angulation of the nasal pyramid.

No other fractures.  No bone lesions.

Orbits: Negative. No traumatic or inflammatory finding.

Sinuses: Single left middle ethmoid air cell shows mucosal
thickening. Sinuses otherwise clear. Clear mastoid air cells and
middle ear cavities.

Soft tissues: Soft tissue swelling over the nose. No other soft
tissue swelling, no masses and no lymphadenopathy.
IMPRESSION: HEAD CT

1. No acute intracranial abnormalities.
2. No skull fracture.

MAXILLOFACIAL CT

1. Bilateral nasal fractures, minimally displaced on the right.
There is associated nasal soft tissue swelling.
2. No other fractures or abnormalities.

## 2019-03-01 ENCOUNTER — Telehealth: Payer: Self-pay

## 2019-03-05 NOTE — Telephone Encounter (Signed)
Open in error

## 2019-03-21 ENCOUNTER — Telehealth: Payer: Self-pay

## 2019-03-21 DIAGNOSIS — F419 Anxiety disorder, unspecified: Secondary | ICD-10-CM

## 2019-03-21 MED ORDER — CLONAZEPAM 1 MG PO TABS: 1.0000 mg | ORAL_TABLET | Freq: Two times a day (BID) | ORAL | 0 refills | Status: DC | PRN

## 2019-03-21 NOTE — Telephone Encounter (Signed)
I received a phone call from [place], Teacher, early years/pre from [facility]. She attempted to run Lauren DEA to fill the patient Clonazepam and it was denied. She wanted to know what she should do to proceed. She said she can change the provider with a verbal if necessary since Leotis Shames is no longer at the practice for the one time fill. This will be the patient last script on file. Please advise

## 2019-03-21 NOTE — Telephone Encounter (Signed)
Re ordered clonazepam  Saralyn Pilar, DO [facility] 03/21/2019, 11:52 AM

## 2019-03-21 NOTE — Telephone Encounter (Signed)
No problem. I just reviewed the past prescription list.  You can give them verbal for my name and DEA to authorize that rx.  Saralyn Pilar, DO [facility] 03/21/2019, 10:12 AM

## 2019-03-21 NOTE — Telephone Encounter (Signed)
When I called back to the pharmacy to give the verbal  I was informed that Harriett Sine had left for the day. The pharmacist Bri deactivated the previous prescription and requested that you send over a new script.

## 2019-04-12 ENCOUNTER — Ambulatory Visit: Payer: Self-pay | Admitting: Licensed Clinical Social Worker

## 2019-04-12 ENCOUNTER — Telehealth: Payer: Self-pay

## 2019-04-12 NOTE — Chronic Care Management (AMB) (Signed)
  Care Management   Follow Up Note   04/12/2019 Name: Katrina Sutton MRN: [id number] DOB: September 28, 1950  Referred by: Galen Manila, NP (Inactive) Reason for referral : Care Coordination   Katrina Sutton is a 69 y.o. year old female who is a primary care patient of Galen Manila, NP (Inactive). The care management team was consulted for assistance with care management and care coordination needs.    Review of patient status, including review of consultants reports, relevant laboratory and other test results, and collaboration with appropriate care team members and the patient's provider was performed as part of comprehensive patient evaluation and provision of chronic care management services.    LCSW completed CCM outreach attempt today but was unable to reach patient successfully. A HIPPA compliant voice message was left encouraging patient to return call once available. LCSW rescheduled CCM SW appointment as well.  A HIPPA compliant phone message was left for the patient providing contact information and requesting a return call.   Dickie La, BSW, MSW, LCSW [facility]  [facility] [email or web address] Phone: [phone]

## 2019-04-13 ENCOUNTER — Other Ambulatory Visit: Payer: Self-pay | Admitting: Nurse Practitioner

## 2019-04-13 DIAGNOSIS — F419 Anxiety disorder, unspecified: Secondary | ICD-10-CM

## 2019-04-13 DIAGNOSIS — K2100 Gastro-esophageal reflux disease with esophagitis, without bleeding: Secondary | ICD-10-CM

## 2019-04-13 MED ORDER — OMEPRAZOLE 20 MG PO CPDR: 20.0000 mg | DELAYED_RELEASE_CAPSULE | Freq: Two times a day (BID) | ORAL | 1 refills | Status: DC

## 2019-04-17 ENCOUNTER — Encounter: Payer: Self-pay | Admitting: General Surgery

## 2019-04-17 ENCOUNTER — Ambulatory Visit: Payer: Medicare HMO | Admitting: General Surgery

## 2019-04-17 ENCOUNTER — Other Ambulatory Visit: Payer: Self-pay

## 2019-04-17 VITALS — BP 144/97 | HR 91 | Temp 97.9°F | Resp 14 | Ht 65.0 in | Wt 173.0 lb

## 2019-04-17 DIAGNOSIS — K5900 Constipation, unspecified: Secondary | ICD-10-CM

## 2019-04-17 DIAGNOSIS — Z09 Encounter for follow-up examination after completed treatment for conditions other than malignant neoplasm: Secondary | ICD-10-CM

## 2019-04-17 NOTE — Progress Notes (Signed)
Patient ID: Katrina Sutton, female   DOB: Apr 19, 1950, 69 y.o.   MRN: [id number]  Chief Complaint  Patient presents with  . Follow-up    Possible umbilical hernia    HPI Katrina Sutton is a 69 y.o. female.   I operated on her in March 2020 for gallstone pancreatitis.  She also had an umbilical hernia that I repaired during the same procedure.  She did well from that operation and was released from our clinic after her postoperative visit.  Over the past week or so, she says that she has become very constipated.  She says that she was straining to have a bowel movement when she noticed some discomfort at her umbilicus.  She was concerned that she may have developed another hernia.  She denies any nausea or vomiting.  No fevers or chills.  She reports bloating after meals.  No significant abdominal pain.   Unrelated, but as a side note, when our medical assistant was taking her vitals today, her pulse oximetry registered 80%.  After several deep breaths, she was able to bring this up to 96%.  She denies any dyspnea, cough, myalgias, headache, or other symptoms suggestive of COVID-19 infection.  She says she feels like it is due to having to wear a mask.   Past Medical History:  Diagnosis Date  . Allergy    seasonal  . Anxiety   . Arthritis    osteoarthritis  . Depression   . GERD (gastroesophageal reflux disease)   . Hiatal hernia     Past Surgical History:  Procedure Laterality Date  . CHOLECYSTECTOMY N/A 05/31/2018   Procedure: LAPAROSCOPIC CHOLECYSTECTOMY;  Surgeon: Duanne Guess, MD;  Location: [facility] ORS;  Service: General;  Laterality: N/A;  . TONSILLECTOMY    . UMBILICAL HERNIA REPAIR N/A 05/31/2018   Procedure: HERNIA REPAIR UMBILICAL ADULT;  Surgeon: Duanne Guess, MD;  Location: [facility] ORS;  Service: General;  Laterality: N/A;  . WRIST SURGERY Left     Family History  Problem Relation Age of Onset  . Mental illness Mother   . Ulcers Father   . Stroke Neg Hx   . Heart attack  Neg Hx   . Cancer Neg Hx     Social History Social History   Tobacco Use  . Smoking status: Former Smoker    Quit date: 12/27/1976    Years since quitting: 42.3  . Smokeless tobacco: Never Used  Substance Use Topics  . Alcohol use: No  . Drug use: No    Allergies  Allergen Reactions  . Erythromycin Shortness Of Breath and Rash  . Duloxetine Palpitations    Current Outpatient Medications  Medication Sig Dispense Refill  . acetaminophen (TYLENOL) 500 MG tablet Take 500 mg by mouth every 8 (eight) hours as needed for headache.    . clonazePAM (KLONOPIN) 1 MG tablet Take 1 tablet (1 mg total) by mouth 2 (two) times daily as needed for anxiety. 60 tablet 0  . clotrimazole-betamethasone (LOTRISONE) cream     . DHA-EPA-Flaxseed Oil-Vitamin E (THERA TEARS NUTRITION PO) Take by mouth.    . hydrocortisone cream 1 % Apply 1 application topically 2 (two) times daily as needed for itching.    . loratadine (CLARITIN) 10 MG tablet Take 10 mg by mouth daily as needed for allergies.    Marland Kitchen nystatin (MYCOSTATIN/NYSTOP) powder Apply topically 3 (three) times daily. For about 2 weeks, may use up to 4 weeks if need 30 g 1  . omeprazole (PRILOSEC)  20 MG capsule Take 1 capsule (20 mg total) by mouth 2 (two) times daily. 180 capsule 1  . PARoxetine (PAXIL) 40 MG tablet TAKE 1 TABLET BY MOUTH DAILY 90 tablet 1  . polyethylene glycol (MIRALAX / GLYCOLAX) packet Take 17 g by mouth daily.    Marland Kitchen senna (SENOKOT) 8.6 MG TABS tablet Take 1 tablet by mouth daily as needed for mild constipation.    . sodium chloride (OCEAN) 0.65 % SOLN nasal spray Place 1 spray into both nostrils as needed for congestion.     No current facility-administered medications for this visit.    Review of Systems Review of Systems  All other systems reviewed and are negative. Or as discussed in the history of present illness.  Blood pressure (!) 144/97, pulse 91, temperature 97.9 F (36.6 C), temperature source Temporal, resp.  rate 14, height 5\' 5"  (1.651 m), weight 173 lb (78.5 kg), SpO2 96 %. Body mass index is 28.79 kg/m.  Physical Exam Physical Exam Constitutional:      General: She is not in acute distress.    Appearance: She is obese.  HENT:     Head: Normocephalic and atraumatic.     Nose:     Comments: Covered with a mask secondary to COVID-19 precautions    Mouth/Throat:     Comments: Covered with a mask secondary to COVID-19 precautions Eyes:     General: No scleral icterus.       Right eye: No discharge.        Left eye: No discharge.  Cardiovascular:     Rate and Rhythm: Normal rate and regular rhythm.  Pulmonary:     Effort: Pulmonary effort is normal. No respiratory distress.  Abdominal:     General: Bowel sounds are normal.     Palpations: Abdomen is soft. There is no mass.     Tenderness: There is no abdominal tenderness.     Hernia: No hernia is present.     Comments: There is no fascial defect appreciated, even with induced maneuvers.  Genitourinary:    Comments: Deferred Musculoskeletal:        General: No deformity.  Skin:    General: Skin is warm and dry.  Neurological:     General: No focal deficit present.     Mental Status: She is alert and oriented to person, place, and time.  Psychiatric:        Mood and Affect: Mood normal.        Behavior: Behavior normal.     Data Reviewed There are no recent relevant data available for review.  Assessment This is a 69 year old woman who underwent laparoscopic cholecystectomy and open umbilical hernia repair in March 2020.  She has had issues with constipation and was concerned that her straining to have a bowel movement may have caused a new umbilical hernia.  I am unable to appreciate any fascial defect or bulge on physical examination.  Plan She was reassured by my physical exam findings.  I told her that if she desired a more definitive confirmation, we could get a CT scan.  She declined.  I have encouraged her to use  MiraLAX and Colace to try and avoid becoming constipated.  She says that Metamucil causes her to feel more bloated.  If she continues to have issues with severe constipation, she may benefit from gastroenterology evaluation, as some of her symptoms do seem consistent with constipation-dominant irritable bowel syndrome.  She may try over-the-counter simethicone, if desired,  for her bloating.  Due to her low initial pulse oximetry, she was encouraged to use the incentive spirometer that she to come from the hospital.  She should also seek evaluation of her primary care provider to determine if further evaluation or management is necessary.  We will see her on an as-needed basis.    Duanne Guess 04/17/2019, 1:53 PM

## 2019-04-17 NOTE — Patient Instructions (Signed)
Please follow up with your primary care physician with your breathing.  Continue the Miralax twice daily. You may also take Colace daily. Be sure to increase your water intake daily. You may add prune juice as well.    Constipation, Adult Constipation is when a person:  Poops (has a bowel movement) fewer times in a week than normal.  Has a hard time pooping.  Has poop that is dry, hard, or bigger than normal. Follow these instructions at home: Eating and drinking   Eat foods that have a lot of fiber, such as: ? Fresh fruits and vegetables. ? Whole grains. ? Beans.  Eat less of foods that are high in fat, low in fiber, or overly processed, such as: ? [place] fries. ? Hamburgers. ? Cookies. ? Candy. ? Soda.  Drink enough fluid to keep your pee (urine) clear or pale yellow. General instructions  Exercise regularly or as told by your doctor.  Go to the restroom when you feel like you need to poop. Do not hold it in.  Take over-the-counter and prescription medicines only as told by your doctor. These include any fiber supplements.  Do pelvic floor retraining exercises, such as: ? Doing deep breathing while relaxing your lower belly (abdomen). ? Relaxing your pelvic floor while pooping.  Watch your condition for any changes.  Keep all follow-up visits as told by your doctor. This is important. Contact a doctor if:  You have pain that gets worse.  You have a fever.  You have not pooped for 4 days.  You throw up (vomit).  You are not hungry.  You lose weight.  You are bleeding from the anus.  You have thin, pencil-like poop (stool). Get help right away if:  You have a fever, and your symptoms suddenly get worse.  You leak poop or have blood in your poop.  Your belly feels hard or bigger than normal (is bloated).  You have very bad belly pain.  You feel dizzy or you faint. This information is not intended to replace advice given to you by your health care  provider. Make sure you discuss any questions you have with your health care provider. Document Revised: 02/11/2017 Document Reviewed: 08/20/2015 Elsevier Patient Education  2020 [employer].

## 2019-04-27 ENCOUNTER — Other Ambulatory Visit: Payer: Self-pay | Admitting: Family Medicine

## 2019-05-03 ENCOUNTER — Ambulatory Visit (INDEPENDENT_AMBULATORY_CARE_PROVIDER_SITE_OTHER): Payer: Medicare HMO | Admitting: Licensed Clinical Social Worker

## 2019-05-03 DIAGNOSIS — F419 Anxiety disorder, unspecified: Secondary | ICD-10-CM

## 2019-05-03 DIAGNOSIS — K5909 Other constipation: Secondary | ICD-10-CM

## 2019-05-03 DIAGNOSIS — F3289 Other specified depressive episodes: Secondary | ICD-10-CM

## 2019-05-03 DIAGNOSIS — F319 Bipolar disorder, unspecified: Secondary | ICD-10-CM

## 2019-05-03 DIAGNOSIS — B372 Candidiasis of skin and nail: Secondary | ICD-10-CM

## 2019-05-03 DIAGNOSIS — F418 Other specified anxiety disorders: Secondary | ICD-10-CM

## 2019-05-04 ENCOUNTER — Other Ambulatory Visit: Payer: Self-pay

## 2019-05-04 ENCOUNTER — Encounter: Payer: Self-pay | Admitting: Family Medicine

## 2019-05-04 ENCOUNTER — Ambulatory Visit (INDEPENDENT_AMBULATORY_CARE_PROVIDER_SITE_OTHER): Payer: Medicare HMO | Admitting: Family Medicine

## 2019-05-04 ENCOUNTER — Ambulatory Visit: Payer: Medicare HMO | Admitting: Nurse Practitioner

## 2019-05-04 VITALS — BP 116/82 | HR 90 | Temp 97.9°F

## 2019-05-04 DIAGNOSIS — K219 Gastro-esophageal reflux disease without esophagitis: Secondary | ICD-10-CM

## 2019-05-04 DIAGNOSIS — F419 Anxiety disorder, unspecified: Secondary | ICD-10-CM | POA: Diagnosis not present

## 2019-05-04 DIAGNOSIS — F3289 Other specified depressive episodes: Secondary | ICD-10-CM | POA: Diagnosis not present

## 2019-05-04 DIAGNOSIS — Z79899 Other long term (current) drug therapy: Secondary | ICD-10-CM | POA: Diagnosis not present

## 2019-05-04 NOTE — Assessment & Plan Note (Signed)
Stable and well controlled on Omeprazole 20mg  PO BID.  Has recently changed herself from 40mg  PO daily and feels is having better control over symptoms.  No active concerns/complaints.  Plan 1) Continue taking Omeprazole 20mg  PO BID 2) Will follow up in clinic in 4-6 weeks for office visit and lab work

## 2019-05-04 NOTE — Assessment & Plan Note (Addendum)
Patient is presently controlled with mild symptoms, GAD7 score remains stable from last screening.  PHQ9 depression screening has improved.  Has met with social work on a telephonic visit today.  Is working to cut down on her tea intake and hopes this will assist her in reducing her anxiety and being able to reduce her clonazepam intake.  Plan 1) Continue with current plan of clonazepam 75m PO BID  2) Actively work on reducing tea intake daily and increasing water intake 3) Continue to speak with social work regarding anxiety reducing techniques 4) To work on deep breathing/mindfulness/meditation exercises as needed for anxiety 5) Will see in clinic in 4-6 weeks for lab values and in office examination

## 2019-05-04 NOTE — Patient Instructions (Signed)
Mood  The following recommendations are helpful adjuncts for helping rebalance your mood.  Eat a nourishing diet. Ensure adequate intake of calories, protein, carbs, fat, vitamins, and minerals. Prioritize whole foods at each meal, including meats, vegetables, fruits, nuts and seeds, etc.   Avoid inflammatory and/or "junk" foods, such as sugar, omega-6 fats, refined grains, chemicals, and preservatives are common in packaged and prepared foods. Minimize or completely avoid these ingredients and stick to whole foods with little to no additives. Cook from scratch as much as possible for more control over what you eat  Get enough sleep. Poor sleep is significantly associated with depression and anxiety. Make 7-9 hours of sleep nightly a top priority  Exercise appropriately. Exercise is known to improve brain functioning and boost mood. Aim for 30 minutes of daily physical activity. Avoid "overtraining," which can cause mental disturbances  Assess your light exposure. Not enough natural light during the day and too much artificial light can have a major impact on your mood. Get outside as often as possible during daylight hours. Minimize light exposure after dark and avoid the use of electronics that give off blue light before bed  Manage your stress.  Use daily stress management techniques such as meditation, yoga, or mindfulness to retrain your brain to respond differently to stress. Try deep breathing to deactivate your "fight or flight" response.  There are many of sources with apps like Headspace, Calm or a variety of YouTube videos (videos from Rom[employer] Belling have guided meditation)  Prioritize your social life. Work on building social support with new friends or improve current relationships. Consider getting a pet that allows for companionship, social interaction, and physical touch. Try volunteering or joining a faith-based community to increase your sense of purpose  Take time to play  Unstructured "play" time can help reduce anxiety and depression Options for play include music, games, sports, dance, art, etc.  Try to add daily omega 3 fatty acids, magnesium, B complex, and balanced amino acid supplements to help improve mood and anxiety.  We will plan to see you back in clinic in 4-6 weeks for a follow up for your anxiety and depression and for your yearly lab work.  Call us with any questions/concerns/needs

## 2019-05-04 NOTE — Progress Notes (Signed)
I have reviewed this encounter including the documentation in this note and/or discussed this patient with the provider, Danielle Rankin FNP. I am certifying that I agree with the content of this note as supervising physician.  Saralyn Pilar, DO [facility] 05/04/2019, 5:24 PM

## 2019-05-04 NOTE — Chronic Care Management (AMB) (Signed)
Chronic Care Management    Clinical Social Work Follow Up Note  05/04/2019 Name: Katrina Sutton MRN: [id number] DOB: [birth year]  Katrina Sutton is a 69 y.o. year old female who is a primary care patient of Tarri Fuller, FNP. The CCM team was consulted for assistance with Mental Health Counseling and Resources.   Review of patient status, including review of consultants reports, other relevant assessments, and collaboration with appropriate care team members and the patient's provider was performed as part of comprehensive patient evaluation and provision of chronic care management services.    SDOH (Social Determinants of Health) assessments performed: Yes    Advanced Directives Status: <no information> See Care Plan for related entries.   Outpatient Encounter Medications as of 05/03/2019  Medication Sig  . acetaminophen (TYLENOL) 500 MG tablet Take 500 mg by mouth every 8 (eight) hours as needed for headache.  . clonazePAM (KLONOPIN) 1 MG tablet Take 1 tablet (1 mg total) by mouth 2 (two) times daily as needed for anxiety.  . clotrimazole-betamethasone (LOTRISONE) cream   . DHA-EPA-Flaxseed Oil-Vitamin E (THERA TEARS NUTRITION PO) Take by mouth.  . hydrocortisone cream 1 % Apply 1 application topically 2 (two) times daily as needed for itching.  . loratadine (CLARITIN) 10 MG tablet Take 10 mg by mouth daily as needed for allergies.  Marland Kitchen nystatin (MYCOSTATIN/NYSTOP) powder Apply topically 3 (three) times daily. For about 2 weeks, may use up to 4 weeks if need  . omeprazole (PRILOSEC) 20 MG capsule Take 1 capsule (20 mg total) by mouth 2 (two) times daily.  Marland Kitchen PARoxetine (PAXIL) 40 MG tablet TAKE 1 TABLET BY MOUTH DAILY  . polyethylene glycol (MIRALAX / GLYCOLAX) packet Take 17 g by mouth daily.  Marland Kitchen senna (SENOKOT) 8.6 MG TABS tablet Take 1 tablet by mouth daily as needed for mild constipation.  . sodium chloride (OCEAN) 0.65 % SOLN nasal spray Place 1 spray into both nostrils as needed for  congestion.   No facility-administered encounter medications on file as of 05/03/2019.     Goals Addressed    . RN- I would like to talk with the SW about my stress (pt-stated)   On track    Current Barriers:  . Chronic Disease Management support and education needs related to anxiety and depression  Nurse Case Manager Clinical Goal(s):  Marland Kitchen Over the next 30 days, patient will work with CM clinical social worker to develop ways to manage stress and anxiety  Interventions:  . Discussed plans with patient for ongoing care management follow up and provided patient with direct contact information for care management team . Social Work referral for anxiety and depression . Discussed with patient her home life and support system.  . Patient stated she was put out of work on disability in 2002 for mental health reasons.  . Patient stated she has one son with learning disabilities (43y/o)who lives in the home  . Discussed patient's physical health which patient reports as very good, she did report a fall in February where she broke her nose. Patient denies the need for a cane or walker, denies unsteadiness.   Patient Self Care Activities:  . Currently UNABLE TO independently manage stress and anxiety.   Initial goal documentation     . SW- I would like to gain additional support to manage my anxiety better (pt-stated)       Current Barriers:  . Limited social support . Mental Health Concerns  . Social Isolation . Limited access  to caregiver . Lacks knowledge of community resource: available mental health resources that she can utilize   Clinical Social Work Clinical Goal(s):  Marland Kitchen Over the next 120 days, client will work with SW to address concerns related to decreasing anxious symptoms by implementing additional mental health support and coping skills into her routine.   Interventions: . Patient interviewed and appropriate assessments performed . Provided mental health counseling with  regard to patient was emotional throughout phone call due to her daily difficulty with mobility/weakness/balance. LCSW used active and reflective listening and implemented appropriate interventions to help suppport patient and her emotional needs. Advised patient to implement deep breathing/grounding/meditation/self-care exercises into her daily routine to combat stressors. Education provided.  . Discussed plans with patient for ongoing care management follow up and provided patient with direct contact information for care management team . Advised patient to implement deep breathing exercises into her routine whenever anxiety/stress arise to help bring her back to the present state.  . Assisted patient/caregiver with obtaining information about health plan benefits . Provided education and assistance to client regarding Advanced Directives. . Provided education to patient/caregiver regarding level of care options. . Referred patient to [facility] (mental health provider) for long term follow up and therapy/counseling. PCP made a referral in August but [name] was never able to establish contact with patient after 3 unsuccessfully calls. LCSW provided patient with their contact information and encouraged her to contact them. . Patient reports that she has a strong social support network of family and friends. . Patient shares that her stomach issues trigger when anxiety arises. She continues to take stool softeners when needed. LCSW provided coping skill education for anxiety management. Patient was receptive to education provided.  Patient Self Care Activities:  . Attends all scheduled provider appointments . Lacks social connections  Please see past updates related to this goal by clicking on the "Past Updates" button in the selected goal      Follow Up Plan: SW will follow up with patient by phone over the next quarter  Eula Fried, [place], MSW, [email or web address] Phone: [phone]

## 2019-05-04 NOTE — Progress Notes (Signed)
Virtual Visit via Telephone The purpose of this virtual visit is to provide medical care while limiting exposure to the novel coronavirus (COVID19) for both patient and office staff.  Consent was obtained for phone visit:  Yes.   Answered questions that patient had about telehealth interaction:  Yes.   I discussed the limitations, risks, security and privacy concerns of performing an evaluation and management service by telephone. I also discussed with the patient that there may be a patient responsible charge related to this service. The patient expressed understanding and agreed to proceed.  Patient Location: Home Provider Location: [facility])  ---------------------------------------------------------------------- Chief Complaint  Patient presents with  . Anxiety    S: Reviewed CMA documentation. I have called patient and gathered additional HPI as follows:  States is doing well with her current treatment for GERD with taking Omeprazole 70m PO BID instead of taking 474mPO daily.  Denies any cough, taste disturbances, difficulty laying flat, or food intolerances with her current treatment plan.  States she has been taking Paxil and Klonopin with good relief of her anxiety.  Has been drinking an increased amount of tea and finds that when she drinks more tea she has increased anxiety.  Has been trying to cut down on her tea intake, and will continue to do that over the next coming months.  Denies any SI/HI.    Patient is currently home.  Lives with her husband of 50+ years and her son. Denies any high risk travel to areas of current concern for COVID19. Denies any known or suspected exposure to person with or possibly with COVID19.  Denies any fevers, chills, sweats, body ache, cough, shortness of breath, sinus pain or pressure, headache, abdominal pain, diarrhea  Past Medical History:  Diagnosis Date  . Allergy    seasonal  . Anxiety   . Arthritis    osteoarthritis  . Depression   . GERD (gastroesophageal reflux disease)   . Hiatal hernia    Social History   Tobacco Use  . Smoking status: Former Smoker    Quit date: 12/27/1976    Years since quitting: 42.3  . Smokeless tobacco: Never Used  Substance Use Topics  . Alcohol use: No  . Drug use: No    Current Outpatient Medications:  .  acetaminophen (TYLENOL) 500 MG tablet, Take 500 mg by mouth every 8 (eight) hours as needed for headache., Disp: , Rfl:  .  clonazePAM (KLONOPIN) 1 MG tablet, Take 1 tablet (1 mg total) by mouth 2 (two) times daily as needed for anxiety., Disp: 60 tablet, Rfl: 0 .  clotrimazole-betamethasone (LOTRISONE) cream, , Disp: , Rfl:  .  hydrocortisone cream 1 %, Apply 1 application topically 2 (two) times daily as needed for itching., Disp: , Rfl:  .  loratadine (CLARITIN) 10 MG tablet, Take 10 mg by mouth daily as needed for allergies., Disp: , Rfl:  .  omeprazole (PRILOSEC) 20 MG capsule, Take 1 capsule (20 mg total) by mouth 2 (two) times daily., Disp: 180 capsule, Rfl: 1 .  PARoxetine (PAXIL) 40 MG tablet, TAKE 1 TABLET BY MOUTH DAILY, Disp: 90 tablet, Rfl: 1 .  polyethylene glycol (MIRALAX / GLYCOLAX) packet, Take 17 g by mouth daily., Disp: , Rfl:  .  senna (SENOKOT) 8.6 MG TABS tablet, Take 1 tablet by mouth daily as needed for mild constipation., Disp: , Rfl:  .  sodium chloride (OCEAN) 0.65 % SOLN nasal spray, Place 1 spray into both nostrils as  needed for congestion., Disp: , Rfl:  .  nystatin (MYCOSTATIN/NYSTOP) powder, Apply topically 3 (three) times daily. For about 2 weeks, may use up to 4 weeks if need (Patient not taking: Reported on 05/04/2019), Disp: 30 g, Rfl: 1  Depression screen [facility] 2/9 05/04/2019 11/01/2018 09/27/2018  Decreased Interest 0 0 0  Down, Depressed, Hopeless 1 1 0  PHQ - 2 Score 1 1 0  Altered sleeping 0 2 1  Tired, decreased energy 0 1 0  Change in appetite _0 Feeling bad or failure about yourself  0 0 0  Trouble  concentrating 0 1 0  Moving slowly or fidgety/restless 0 2 0  Suicidal thoughts 0 0 0  PHQ-9 Score _1 Difficult doing work/chores Not difficult at all Not difficult at all Not difficult at all  Some recent data might be hidden    GAD 7 : Generalized Anxiety Score 05/04/2019 11/01/2018 05/02/2018 01/27/2018  Nervous, Anxious, on Edge _2 Control/stop worrying 0 0 0 0  Worry too much - different things _3 0  Trouble relaxing 0 1 0 0  Restless 0 0 3 1  Easily annoyed or irritable 2 2 0 1  Afraid - awful might happen 0 0 0 0  Total GAD 7 Score _4 Anxiety Difficulty Not difficult at all Not difficult at all Not difficult at all Not difficult at all    -------------------------------------------------------------------------- O: No physical exam performed due to remote telephone encounter.  No results found for this or any previous visit (from the past 2160 hour(s)).  -------------------------------------------------------------------------- A&P:  Problem List Items Addressed This Visit      Digestive   GERD (gastroesophageal reflux disease) - Primary    Stable and well controlled on Omeprazole 86m PO BID.  Has recently changed herself from 449mPO daily and feels is having better control over symptoms.  No active concerns/complaints.  Plan 1) Continue taking Omeprazole 2039mO BID 2) Will follow up in clinic in 4-6 weeks for office visit and lab work      Relevant Orders   Lipid panel   Comprehensive metabolic panel   CBC with Differential/Platelet     Other   Anxiety    Patient is presently controlled with mild symptoms, GAD7 score remains stable from last screening.  PHQ9 depression screening has improved.  Has met with social work on a telephonic visit today.  Is working to cut down on her tea intake and hopes this will assist her in reducing her anxiety and being able to reduce her clonazepam intake.  Plan 1) Continue with current plan of clonazepam 1mg54mO BID  2) Actively work on reducing tea intake daily and increasing water intake 3) Continue to speak with social work regarding anxiety reducing techniques 4) To work on deep breathing/mindfulness/meditation exercises as needed for anxiety 5) Will see in clinic in 4-6 weeks for lab values and in office examination      Depression    Other Visit Diagnoses    History of long-term treatment with high-risk medication          No orders of the defined types were placed in this encounter.   Follow-up: - Return in 4-6 weeks months for follow up on Anxiety & Depression. - Future labs ordered for 4-5 weeks from now, please complete prior to your appointment.  Patient verbalizes understanding with the above medical recommendations including the limitation  of remote medical advice.  Specific follow-up and call-back criteria were given for patient to follow-up or seek medical care more urgently if needed.   - Time spent in direct consultation with patient on phone: 11 minutes  Harlin Rain, [facility] 05/04/2019, 1:20 PM

## 2019-05-08 ENCOUNTER — Ambulatory Visit (INDEPENDENT_AMBULATORY_CARE_PROVIDER_SITE_OTHER): Payer: Medicare HMO | Admitting: Family Medicine

## 2019-05-08 ENCOUNTER — Emergency Department: Payer: Medicare HMO

## 2019-05-08 ENCOUNTER — Encounter: Payer: Self-pay | Admitting: Emergency Medicine

## 2019-05-08 ENCOUNTER — Observation Stay
Admission: EM | Admit: 2019-05-08 | Discharge: 2019-05-09 | Disposition: A | Discharge status: Home / Self Care | Payer: Medicare HMO | Attending: Internal Medicine | Admitting: Internal Medicine

## 2019-05-08 ENCOUNTER — Encounter: Payer: Self-pay | Admitting: Family Medicine

## 2019-05-08 ENCOUNTER — Other Ambulatory Visit: Payer: Self-pay

## 2019-05-08 DIAGNOSIS — Z87891 Personal history of nicotine dependence: Secondary | ICD-10-CM | POA: Diagnosis not present

## 2019-05-08 DIAGNOSIS — F329 Major depressive disorder, single episode, unspecified: Secondary | ICD-10-CM | POA: Insufficient documentation

## 2019-05-08 DIAGNOSIS — R0602 Shortness of breath: Secondary | ICD-10-CM | POA: Diagnosis not present

## 2019-05-08 DIAGNOSIS — K449 Diaphragmatic hernia without obstruction or gangrene: Secondary | ICD-10-CM

## 2019-05-08 DIAGNOSIS — J9601 Acute respiratory failure with hypoxia: Secondary | ICD-10-CM | POA: Diagnosis not present

## 2019-05-08 DIAGNOSIS — Z66 Do not resuscitate: Secondary | ICD-10-CM | POA: Diagnosis not present

## 2019-05-08 DIAGNOSIS — J9811 Atelectasis: Secondary | ICD-10-CM | POA: Insufficient documentation

## 2019-05-08 DIAGNOSIS — K219 Gastro-esophageal reflux disease without esophagitis: Secondary | ICD-10-CM | POA: Diagnosis not present

## 2019-05-08 DIAGNOSIS — F411 Generalized anxiety disorder: Secondary | ICD-10-CM

## 2019-05-08 DIAGNOSIS — R0600 Dyspnea, unspecified: Secondary | ICD-10-CM

## 2019-05-08 DIAGNOSIS — R0609 Other forms of dyspnea: Secondary | ICD-10-CM

## 2019-05-08 DIAGNOSIS — M7989 Other specified soft tissue disorders: Secondary | ICD-10-CM | POA: Insufficient documentation

## 2019-05-08 DIAGNOSIS — R0902 Hypoxemia: Secondary | ICD-10-CM

## 2019-05-08 DIAGNOSIS — F419 Anxiety disorder, unspecified: Secondary | ICD-10-CM | POA: Insufficient documentation

## 2019-05-08 DIAGNOSIS — Z20822 Contact with and (suspected) exposure to covid-19: Secondary | ICD-10-CM | POA: Diagnosis not present

## 2019-05-08 DIAGNOSIS — F418 Other specified anxiety disorders: Secondary | ICD-10-CM

## 2019-05-08 LAB — CBC
HCT: 44.8 % (ref 36.0–46.0)
Hemoglobin: 15.3 g/dL — ABNORMAL HIGH (ref 12.0–15.0)
MCH: 30.2 pg (ref 26.0–34.0)
MCHC: 34.2 g/dL (ref 30.0–36.0)
MCV: 88.4 fL (ref 80.0–100.0)
Platelets: 260 10*3/uL (ref 150–400)
RBC: 5.07 MIL/uL (ref 3.87–5.11)
RDW: 12.7 % (ref 11.5–15.5)
WBC: 8.6 10*3/uL (ref 4.0–10.5)
nRBC: 0 % (ref 0.0–0.2)

## 2019-05-08 LAB — BASIC METABOLIC PANEL
Anion gap: 11 (ref 5–15)
BUN: 12 mg/dL (ref 8–23)
CO2: 25 mmol/L (ref 22–32)
Calcium: 9.4 mg/dL (ref 8.9–10.3)
Chloride: 101 mmol/L (ref 98–111)
Creatinine, Ser: 0.95 mg/dL (ref 0.44–1.00)
GFR calc Af Amer: >60 mL/min (ref >60)
GFR calc non Af Amer: >60 mL/min (ref >60)
Glucose, Bld: 99 mg/dL (ref 70–99)
Potassium: 3.7 mmol/L (ref 3.5–5.1)
Sodium: 137 mmol/L (ref 135–145)

## 2019-05-08 LAB — BRAIN NATRIURETIC PEPTIDE: B Natriuretic Peptide: 59 pg/mL (ref 0.0–100.0)

## 2019-05-08 LAB — RESPIRATORY PANEL BY RT PCR (FLU A&B, COVID)
Influenza A by PCR: NEGATIVE
Influenza B by PCR: NEGATIVE
SARS Coronavirus 2 by RT PCR: NEGATIVE

## 2019-05-08 LAB — TROPONIN I (HIGH SENSITIVITY): Troponin I (High Sensitivity): 4 ng/L (ref <18)

## 2019-05-08 MED ORDER — SALINE SPRAY 0.65 % NA SOLN
1.0000 | NASAL | Status: DC | PRN
  Filled 2019-05-08: qty 44

## 2019-05-08 MED ORDER — IOHEXOL 350 MG/ML SOLN
75.0000 mL | Freq: Once | INTRAVENOUS | Status: AC | PRN
  Administered 2019-05-08: 20:00:00 75 mL via INTRAVENOUS

## 2019-05-08 MED ORDER — PAROXETINE HCL 20 MG PO TABS
40.0000 mg | ORAL_TABLET | Freq: Every day | ORAL | Status: DC
  Administered 2019-05-09: 08:00:00 40 mg via ORAL
  Filled 2019-05-08: qty 2

## 2019-05-08 MED ORDER — ENOXAPARIN SODIUM 40 MG/0.4ML ~~LOC~~ SOLN
40.0000 mg | SUBCUTANEOUS | Status: DC
  Administered 2019-05-09: 40 mg via SUBCUTANEOUS
  Filled 2019-05-08: qty 0.4

## 2019-05-08 MED ORDER — PANTOPRAZOLE SODIUM 40 MG PO TBEC
40.0000 mg | DELAYED_RELEASE_TABLET | Freq: Every day | ORAL | Status: DC
  Administered 2019-05-09: 08:00:00 40 mg via ORAL
  Filled 2019-05-08: qty 1

## 2019-05-08 MED ORDER — ACETAMINOPHEN 650 MG RE SUPP: 650.0000 mg | Freq: Four times a day (QID) | RECTAL | Status: DC | PRN

## 2019-05-08 MED ORDER — SENNA 8.6 MG PO TABS: 1.0000 | ORAL_TABLET | Freq: Every day | ORAL | Status: DC | PRN

## 2019-05-08 MED ORDER — CLONAZEPAM 1 MG PO TABS
1.0000 mg | ORAL_TABLET | Freq: Two times a day (BID) | ORAL | Status: DC | PRN
  Administered 2019-05-09: 1 mg via ORAL
  Filled 2019-05-08: qty 1

## 2019-05-08 MED ORDER — ACETAMINOPHEN 325 MG PO TABS
650.0000 mg | ORAL_TABLET | Freq: Four times a day (QID) | ORAL | Status: DC | PRN
  Administered 2019-05-09: 650 mg via ORAL
  Filled 2019-05-08: qty 2

## 2019-05-08 NOTE — Progress Notes (Signed)
I have reviewed this encounter including the documentation in this note and/or discussed this patient with the provider, Danielle Rankin FNP. I am certifying that I agree with the content of this note as supervising physician.  Saralyn Pilar, DO [facility] 05/08/2019, 4:47 PM

## 2019-05-08 NOTE — ED Notes (Signed)
Pt reports sob for several months but worse during the past few days.  Denies chest pain.  Pt has sob with exertion.  Pt placed on 2 liters oxygen .  Pt alert speech clear.

## 2019-05-08 NOTE — Assessment & Plan Note (Signed)
See Dyspnea A/P

## 2019-05-08 NOTE — Progress Notes (Signed)
6 minute walk test   SPO2 sitting 91-93% Pulse- 92  SPO2 walking 81%( 90 seconds) stopped- Pulse 154   SPO2 at rest 87-90 %  Pulse 92-93

## 2019-05-08 NOTE — H&P (Signed)
History and Physical    Katrina Sutton XNA:[id number] DOB: [birth year] DOA: 05/08/2019  PCP: Tarri Fuller, FNP  Patient coming from: Home  I have personally briefly reviewed patient's old medical records in [facility] Link  Chief Complaint: Dyspnea, hypoxia  HPI: Katrina Sutton is a 69 y.o. female with medical history significant for paraesophageal hernia, GERD, and depression/anxiety who presents to the ED for evaluation of progressive dyspnea and hypoxia.  Patient states that she has been having progressive dyspnea on exertion which she says has been ongoing for about 1 year now.  Over the last 3-4 weeks this has been worsening and she becomes short of breath after walking 3-4 minutes.  She reports orthopnea and has been using 3 pillows under her head when she sleeps for a long period of time now.  Per review of PCP notes, she reported bilateral lower extremity swelling and nonproductive cough however denies at the time of admission.  She says her husband has told her that she snores at night when she sleeps but is not sure if she has apneic-like episodes.  She has not been evaluated for sleep apnea.  She denies any subjective fevers, chills, diaphoresis, nausea, vomiting, abdominal pain, chest pain, dysuria.  She denies any previously known lung problems.  She denies any significant history of tobacco use.  She was seen at her PCP office prior to admission on 05/08/2019.  1 minute into a 6-minute walk test she was noted to desaturate to 81% with a heart rate of 154 bpm.  She was noted to have labored breathing and unable to speak in full sentences after activity which resolved after resting.  She was recommended to present to the ED for further evaluation and management.  ED Course:  Initial vitals showed BP 133/92, pulse 79, RR 18, SPO2 97%.  Per EDP, O2 saturation dropped to 89% on room air while at rest.  Outpatient labs prior to admission showed WBC 8.6, hemoglobin 15.3, platelets 260,000,  sodium 137, potassium 3.7, bicarb 25, BUN 12, creatinine 0.95, serum glucose 99, BNP 59.  SARS-CoV-2 PCR is negative, influenza A/B PCR's are negative.  High-sensitivity troponin I is 4.  2 view chest x-ray showed stable large right-sided paraesophageal hernia with stable minimal left basilar subsegmental atelectasis versus scarring.  CTA chest PE study was negative for evidence of pulmonary embolus.  Large paraesophageal hernia with chronic compressive atelectasis at the right lung base is noted without acute intrathoracic process.  The hospitalist service was consulted to admit for further evaluation and management.  Review of Systems: All systems reviewed and are negative except as documented in history of present illness above.   Past Medical History:  Diagnosis Date  . Allergy    seasonal  . Anxiety   . Arthritis    osteoarthritis  . Depression   . GERD (gastroesophageal reflux disease)   . Hiatal hernia     Past Surgical History:  Procedure Laterality Date  . CHOLECYSTECTOMY N/A 05/31/2018   Procedure: LAPAROSCOPIC CHOLECYSTECTOMY;  Surgeon: Katrina Guess, MD;  Location: [facility] ORS;  Service: General;  Laterality: N/A;  . TONSILLECTOMY    . UMBILICAL HERNIA REPAIR N/A 05/31/2018   Procedure: HERNIA REPAIR UMBILICAL ADULT;  Surgeon: Katrina Guess, MD;  Location: [facility] ORS;  Service: General;  Laterality: N/A;  . WRIST SURGERY Left     Social History:  reports that she quit smoking about 42 years ago. She has never used smokeless tobacco. She reports that she does  not drink alcohol or use drugs.  Allergies  Allergen Reactions  . Erythromycin Shortness Of Breath and Rash  . Duloxetine Palpitations    Family History  Problem Relation Age of Onset  . Mental illness Mother   . Ulcers Father   . Stroke Neg Hx   . Heart attack Neg Hx   . Cancer Neg Hx      Prior to Admission medications   Medication Sig Start Date End Date Taking? Authorizing Provider    acetaminophen (TYLENOL) 500 MG tablet Take 500 mg by mouth every 8 (eight) hours as needed for headache.    [provider]  clonazePAM (KLONOPIN) 1 MG tablet Take 1 tablet (1 mg total) by mouth 2 (two) times daily as needed for anxiety. 03/21/19   Katrina Cords, DO  clotrimazole-betamethasone (LOTRISONE) cream  09/21/18   [provider]  hydrocortisone cream 1 % Apply 1 application topically 2 (two) times daily as needed for itching.    [provider]  loratadine (CLARITIN) 10 MG tablet Take 10 mg by mouth daily as needed for allergies.    [provider]  nystatin (MYCOSTATIN/NYSTOP) powder Apply topically 3 (three) times daily. For about 2 weeks, may use up to 4 weeks if need 02/15/19   Katrina Sutton, Katrina Neat, DO  omeprazole (PRILOSEC) 20 MG capsule Take 1 capsule (20 mg total) by mouth 2 (two) times daily. 04/13/19   Katrina Sutton, Katrina Neat, DO  PARoxetine (PAXIL) 40 MG tablet TAKE 1 TABLET BY MOUTH DAILY 04/13/19   Katrina Sutton, Katrina Neat, DO  polyethylene glycol (MIRALAX / GLYCOLAX) packet Take 17 g by mouth daily.    [provider]  senna (SENOKOT) 8.6 MG TABS tablet Take 1 tablet by mouth daily as needed for mild constipation.    [provider]  sodium chloride (OCEAN) 0.65 % SOLN nasal spray Place 1 spray into both nostrils as needed for congestion.    [provider]    Physical Exam: Vitals:   05/08/19 2200 05/08/19 2220 05/08/19 2240 05/08/19 2300  BP: (!) 153/89 (!) 156/93 (!) 147/97   Pulse: 73 68 70 70  Resp: (!) 22 16 18 18   Temp:      TempSrc:      SpO2: 98% 98% 97% 97%  Weight:      Height:       Constitutional: Resting in bed with head elevated, NAD, calm, comfortable Eyes: PERRL, lids and conjunctivae normal ENMT: Mucous membranes are moist. Posterior pharynx clear of any exudate or lesions.Normal dentition.  Neck: normal, supple, no masses. Respiratory: clear to auscultation bilaterally, no  wheezing, no crackles. Normal respiratory effort. No accessory muscle use.  Cardiovascular: Regular rate and rhythm, no murmurs / rubs / gallops. No extremity edema. 2+ pedal pulses. Abdomen: no tenderness, no masses palpated. No hepatosplenomegaly. Bowel sounds positive.  Musculoskeletal: no clubbing / cyanosis. No joint deformity upper and lower extremities. Good ROM, no contractures. Normal muscle tone.  Skin: no rashes, lesions, ulcers. No induration Neurologic: CN 2-12 grossly intact. Sensation intact,Strength 5/5 in all 4.  Psychiatric: Normal judgment and insight. Alert and oriented x 3. Normal mood.     Labs on Admission: I have personally reviewed following labs and imaging studies  CBC: Recent Labs  Lab 05/08/19 1624  WBC 8.6  HGB 15.3*  HCT 44.8  MCV 88.4  PLT 260   Basic Metabolic Panel: Recent Labs  Lab 05/08/19 1624  NA 137  K 3.7  CL 101  CO2 25  GLUCOSE 99  BUN 12  CREATININE 0.95  CALCIUM 9.4   GFR: Estimated Creatinine Clearance: 56.3 mL/min (by C-G formula based on SCr of 0.95 mg/dL). Liver Function Tests: No results for input(s): AST, ALT, ALKPHOS, BILITOT, PROT, ALBUMIN in the last 168 hours. No results for input(s): LIPASE, AMYLASE in the last 168 hours. No results for input(s): AMMONIA in the last 168 hours. Coagulation Profile: No results for input(s): INR, PROTIME in the last 168 hours. Cardiac Enzymes: No results for input(s): CKTOTAL, CKMB, CKMBINDEX, TROPONINI in the last 168 hours. BNP (last 3 results) No results for input(s): PROBNP in the last 8760 hours. HbA1C: No results for input(s): HGBA1C in the last 72 hours. CBG: No results for input(s): GLUCAP in the last 168 hours. Lipid Profile: No results for input(s): CHOL, HDL, LDLCALC, TRIG, CHOLHDL, LDLDIRECT in the last 72 hours. Thyroid Function Tests: No results for input(s): TSH, T4TOTAL, FREET4, T3FREE, THYROIDAB in the last 72 hours. Anemia Panel: No results for input(s):  VITAMINB12, FOLATE, FERRITIN, TIBC, IRON, RETICCTPCT in the last 72 hours. Urine analysis:    Component Value Date/Time   COLORURINE YELLOW (A) 05/15/2018 1615   APPEARANCEUR CLEAR (A) 05/15/2018 1615   APPEARANCEUR Clear 06/28/2011 1412   LABSPEC 1.011 05/15/2018 1615   LABSPEC 1.005 06/28/2011 1412   PHURINE 6.0 05/15/2018 1615   GLUCOSEU NEGATIVE 05/15/2018 1615   GLUCOSEU Negative 06/28/2011 1412   HGBUR SMALL (A) 05/15/2018 1615   BILIRUBINUR NEGATIVE 05/15/2018 1615   BILIRUBINUR Negative 06/28/2011 1412   KETONESUR NEGATIVE 05/15/2018 1615   PROTEINUR NEGATIVE 05/15/2018 1615   NITRITE NEGATIVE 05/15/2018 1615   LEUKOCYTESUR LARGE (A) 05/15/2018 1615   LEUKOCYTESUR Negative 06/28/2011 1412    Radiological Exams on Admission: DG Chest 2 View  Result Date: 05/08/2019 CLINICAL DATA:  Shortness of breath. EXAM: CHEST - 2 VIEW COMPARISON:  10/01/2017. FINDINGS: Stable cardiomediastinal silhouette. No pneumothorax is noted. Stable large right-sided paraesophageal hernia is noted. No pleural effusion is noted. Stable minimal left basilar subsegmental atelectasis or scarring is noted. Bony thorax is unremarkable. IMPRESSION: Stable large right-sided paraesophageal hernia. Stable minimal left basilar subsegmental atelectasis or scarring. Electronically Signed   By: Marijo Conception M.D.   On: 05/08/2019 16:57   CT Angio Chest PE W and/or Wo Contrast  Result Date: 05/08/2019 CLINICAL DATA:  Worsening hypoxia and shortness of breath for several months, dyspnea on exertion EXAM: CT ANGIOGRAPHY CHEST WITH CONTRAST TECHNIQUE: Multidetector CT imaging of the chest was performed using the standard protocol during bolus administration of intravenous contrast. Multiplanar CT image reconstructions and MIPs were obtained to evaluate the vascular anatomy. CONTRAST:  54mL OMNIPAQUE IOHEXOL 350 MG/ML SOLN COMPARISON:  05/08/2019, 03/10/2018 FINDINGS: Cardiovascular: This is a technically adequate  evaluation of the pulmonary vasculature. There are no filling defects or pulmonary emboli. Heart is unremarkable without pericardial effusion. Normal caliber of the thoracic aorta without evidence of dissection. Aberrant origin of the right subclavian artery is identified, a frequent anatomic variant. Mediastinum/Nodes: No pathologic mediastinal or hilar adenopathy. There is a large paraesophageal hernia resulting in significant compressive atelectasis of the right lower lobe, stable. Lungs/Pleura: Chronic compressive atelectasis of the right lower lobe. No acute airspace disease, effusion, or pneumothorax. Central airways are patent. Upper Abdomen: Large paraesophageal hernia with the majority of the stomach contained within the hernia sac, as well as portions of the transverse colon. No bowel obstruction. Remaining structures of the upper abdomen are unremarkable. Musculoskeletal: No acute or destructive bony  lesions. Reconstructed images demonstrate no additional findings. Review of the MIP images confirms the above findings. IMPRESSION: 1. No evidence of pulmonary embolus. 2. Large paraesophageal hernia with chronic compressive atelectasis at the right lung base. 3. No acute intrathoracic process. Electronically Signed   By: Sharlet Salina M.D.   On: 05/08/2019 20:44    EKG: Independently reviewed. Sinus rhythm, LAFB.  Not significantly changed when compared to prior.  Assessment/Plan Principal Problem:   Acute respiratory failure with hypoxia [facility]) Active Problems:   Depression with anxiety   Paraesophageal hernia  Katrina Sutton is a 69 y.o. female with medical history significant for paraesophageal hernia, GERD, and depression/anxiety who is admitted with acute respiratory failure with hypoxia.  Acute respiratory failure with hypoxia: Unclear etiology.  Chest x-ray and CTA without evidence of pneumonia or pulmonary embolism.  A stable large paraesophageal hernia is noted with compressive  atelectasis at the right lung base.  She has no known history of CHF and appears euvolemic on admission.  BNP was 59.  Lungs are clear to auscultation without crackles or wheezing.  She does report symptoms of sleep apnea.  She is requiring 1-2 L supplemental O2 via [place] at time of admission. -Obtain echocardiogram -Check ABG on room air -Continue supplemental oxygen as needed and wean off as able  Paraesophageal hernia: Chronic and stable large paraesophageal hernia with compressive atelectasis at the right lung base noted on CT.  Depression/anxiety: Continue paroxetine and as needed clonazepam with hold parameters.  DVT prophylaxis: Lovenox Code Status: DNR, confirmed with patient Family Communication: Discussed with patient, she has discussed with family Disposition Plan: From home, likely discharge to home pending echocardiogram and evaluation of hypoxia.  May require home O2. Consults called: None Admission status: Observation   Darreld Mclean MD [facility]  If 7PM-7AM, please contact night-coverage [email or web address]  05/09/2019, 12:03 AM

## 2019-05-08 NOTE — ED Provider Notes (Signed)
[facility] Emergency Department Provider Note  ____________________________________________   I have reviewed the triage vital signs and the nursing notes.   HISTORY  Chief Complaint Shortness of Breath   History limited by: Not Limited   HPI Katrina Sutton is a 69 y.o. female who presents to the emergency department today because of concern for shortness of breath. She states that for roughly the past month she has noticed increasing shortness of breath. It has been worse when she has laid flat at night or when she has exerted herself. She denies any chest pain with the shortness of breath. Denies any fevers. States she has had some swelling in her legs but that has been present for years. Went to her PCPs office today where they found her to have low oxygen saturation. Denies significant smoking history.   Records reviewed. Per medical record review patient has a history of gerd, hiatal hernia.   Past Medical History:  Diagnosis Date  . Allergy    seasonal  . Anxiety   . Arthritis    osteoarthritis  . Depression   . GERD (gastroesophageal reflux disease)   . Hiatal hernia     Patient Active Problem List   Diagnosis Date Noted  . Leg swelling 05/08/2019  . Constipation 04/17/2019  . S/P umbilical hernia repair, follow-up exam 06/21/2018  . S/P laparoscopic cholecystectomy 05/31/2018  . Gallstone pancreatitis   . Umbilical hernia without obstruction and without gangrene   . Dyspnea 03/11/2018  . Bipolar 1 disorder (HCC) 08/23/2017  . GERD (gastroesophageal reflux disease) 12/27/2016  . Arthritis 12/27/2016  . Anxiety 12/27/2016  . Depression 12/27/2016  . Seasonal allergic rhinitis 12/27/2016    Past Surgical History:  Procedure Laterality Date  . CHOLECYSTECTOMY N/A 05/31/2018   Procedure: LAPAROSCOPIC CHOLECYSTECTOMY;  Surgeon: Duanne Guess, MD;  Location: [facility] ORS;  Service: General;  Laterality: N/A;  . TONSILLECTOMY    . UMBILICAL  HERNIA REPAIR N/A 05/31/2018   Procedure: HERNIA REPAIR UMBILICAL ADULT;  Surgeon: Duanne Guess, MD;  Location: [facility] ORS;  Service: General;  Laterality: N/A;  . WRIST SURGERY Left     Prior to Admission medications   Medication Sig Start Date End Date Taking? Authorizing Provider  acetaminophen (TYLENOL) 500 MG tablet Take 500 mg by mouth every 8 (eight) hours as needed for headache.    [provider]  clonazePAM (KLONOPIN) 1 MG tablet Take 1 tablet (1 mg total) by mouth 2 (two) times daily as needed for anxiety. 03/21/19   Smitty Cords, DO  clotrimazole-betamethasone (LOTRISONE) cream  09/21/18   [provider]  hydrocortisone cream 1 % Apply 1 application topically 2 (two) times daily as needed for itching.    [provider]  loratadine (CLARITIN) 10 MG tablet Take 10 mg by mouth daily as needed for allergies.    [provider]  nystatin (MYCOSTATIN/NYSTOP) powder Apply topically 3 (three) times daily. For about 2 weeks, may use up to 4 weeks if need 02/15/19   Althea Charon, Netta Neat, DO  omeprazole (PRILOSEC) 20 MG capsule Take 1 capsule (20 mg total) by mouth 2 (two) times daily. 04/13/19   Karamalegos, Netta Neat, DO  PARoxetine (PAXIL) 40 MG tablet TAKE 1 TABLET BY MOUTH DAILY 04/13/19   Karamalegos, Netta Neat, DO  polyethylene glycol (MIRALAX / GLYCOLAX) packet Take 17 g by mouth daily.    [provider]  senna (SENOKOT) 8.6 MG TABS tablet Take 1 tablet by mouth daily as needed  for mild constipation.    [provider]  sodium chloride (OCEAN) 0.65 % SOLN nasal spray Place 1 spray into both nostrils as needed for congestion.    [provider]    Allergies Erythromycin and Duloxetine  Family History  Problem Relation Age of Onset  . Mental illness Mother   . Ulcers Father   . Stroke Neg Hx   . Heart attack Neg Hx   . Cancer Neg Hx     Social History Social History   Tobacco Use  . Smoking  status: Former Smoker    Quit date: 12/27/1976    Years since quitting: 42.3  . Smokeless tobacco: Never Used  Substance Use Topics  . Alcohol use: No  . Drug use: No    Review of Systems Constitutional: No fever/chills Eyes: No visual changes. ENT: No sore throat. Cardiovascular: Denies chest pain. Respiratory: Positive for shortness of breath. Gastrointestinal: No abdominal pain.  No nausea, no vomiting.  No diarrhea.   Genitourinary: Negative for dysuria. Musculoskeletal: Positive for chronic leg swelling.  Skin: Negative for rash. Neurological: Negative for headaches, focal weakness or numbness.  ____________________________________________   PHYSICAL EXAM:  VITAL SIGNS: ED Triage Vitals  Enc Vitals Group     BP 05/08/19 1616 (!) 154/86     Pulse Rate 05/08/19 1616 90     Resp 05/08/19 1616 (!) 22     Temp 05/08/19 1616 98.1 F (36.7 C)     Temp Source 05/08/19 1616 Oral     SpO2 05/08/19 1615 93 %     Weight 05/08/19 1617 173 lb 9.6 oz (78.7 kg)     Height 05/08/19 1617 5\' 5"  (1.651 m)     Head Circumference --      Peak Flow --      Pain Score 05/08/19 1616 0   Constitutional: Alert and oriented.  Eyes: Conjunctivae are normal.  ENT      Head: Normocephalic and atraumatic.      Nose: No congestion/rhinnorhea.      Mouth/Throat: Mucous membranes are moist.      Neck: No stridor. Hematological/Lymphatic/Immunilogical: No cervical lymphadenopathy. Cardiovascular: Normal rate, regular rhythm.  No murmurs, rubs, or gallops.  Respiratory: Normal respiratory effort without tachypnea nor retractions. Breath sounds are clear and equal bilaterally. No wheezes/rales/rhonchi. Gastrointestinal: Soft and non tender. No rebound. No guarding.  Genitourinary: Deferred Musculoskeletal: Normal range of motion in all extremities. No lower extremity edema. Neurologic:  Normal speech and language. No gross focal neurologic deficits are appreciated.  Skin:  Skin is warm, dry  and intact. No rash noted. Psychiatric: Mood and affect are normal. Speech and behavior are normal. Patient exhibits appropriate insight and judgment.  ____________________________________________    LABS (pertinent positives/negatives)  BMP wnl CBC wbc 8.6, hgb 15.3, plt 260 Trop hs 4 BNP 59.0 COVID negative ____________________________________________   EKG  I, Nance Pear, attending physician, personally viewed and interpreted this EKG  EKG Time: 1619 Rate: 86 Rhythm: normal sinus rhythm Axis: left axis deviation Intervals: qtc 466 QRS: LAFB ST changes: no st elevation Impression: abnormal ekg  ____________________________________________    RADIOLOGY  CXR Stable right sided paraesophageal hernia.    ____________________________________________   PROCEDURES  Procedures  ____________________________________________   INITIAL IMPRESSION / ASSESSMENT AND PLAN / ED COURSE  Pertinent labs & imaging results that were available during my care of the patient were reviewed by me and considered in my medical decision making (see chart for details).   Patient  presented to the emergency department today because of concerns for shortness of breath.  She was found to be hypoxic at primary care physician's office.  Broad work-up was initiated however without obvious etiology of the patient's hypoxia.  CT scan did not show any pulmonary embolisms.  No evidence for pneumonia.  Covid was negative.  BNP and troponin both normal.  However given patient's hypoxia I do believe she would benefit from hospitalization and further oxygen.  Discussed this with the patient.  ____________________________________________   FINAL CLINICAL IMPRESSION(S) / ED DIAGNOSES  Final diagnoses:  Hypoxia  SOB (shortness of breath)     Note: This dictation was prepared with Dragon dictation. Any transcriptional errors that result from this process are unintentional     Phineas Semen, MD 05/08/19 2310

## 2019-05-08 NOTE — Progress Notes (Signed)
Subjective:    Patient ID: Katrina Sutton, female    DOB: 05-Jun-1950, 69 y.o.   MRN: [id number]  Katrina Sutton is a 69 y.o. female presenting on 05/08/2019 for Shortness of Breath (breathing issues worsen with movement. The SOB worsen at night causing her to have to sit up and sleep. Pt also non productive cough)   HPI  Patient presents to clinic with c/o shortness of breath, dyspnea with exertion, orthopnea, non-productive cough and bilateral lower leg swelling x 3 weeks.  States her leg swelling does not resolve after having her legs up at night.  Has not taken anything for her symptoms.  Has found that sitting alleviates her symptoms mildly.  Denies fevers, sore throat, change in taste/smell, CP, abdominal pain, n/v/d.  Depression screen [facility] 2/9 05/04/2019 11/01/2018 09/27/2018  Decreased Interest 0 0 0  Down, Depressed, Hopeless 1 1 0  PHQ - 2 Score 1 1 0  Altered sleeping 0 2 1  Tired, decreased energy 0 1 0  Change in appetite 2 2 1   Feeling bad or failure about yourself  0 0 0  Trouble concentrating 0 1 0  Moving slowly or fidgety/restless 0 2 0  Suicidal thoughts 0 0 0  PHQ-9 Score 3 9 2   Difficult doing work/chores Not difficult at all Not difficult at all Not difficult at all  Some recent data might be hidden    Social History   Tobacco Use  . Smoking status: Former Smoker    Quit date: 12/27/1976    Years since quitting: 42.3  . Smokeless tobacco: Never Used  Substance Use Topics  . Alcohol use: No  . Drug use: No    Review of Systems  Constitutional: Positive for activity change and fatigue. Negative for appetite change, chills and fever.  HENT: Negative for drooling, facial swelling, mouth sores, nosebleeds, postnasal drip, rhinorrhea, sinus pressure, sinus pain, sore throat, trouble swallowing and voice change.   Eyes: Negative for visual disturbance.  Respiratory: Positive for cough and shortness of breath. Negative for apnea, chest tightness and wheezing.    Cardiovascular: Positive for leg swelling. Negative for chest pain and palpitations.  Gastrointestinal: Negative for abdominal pain, constipation, diarrhea, nausea and vomiting.  Endocrine: Negative for polydipsia, polyphagia and polyuria.  Genitourinary: Negative for difficulty urinating, dysuria, frequency, hematuria and urgency.  Musculoskeletal: Negative for arthralgias.  Neurological: Negative for dizziness, syncope, weakness, light-headedness, numbness and headaches.  Hematological: Negative for adenopathy. Does not bruise/bleed easily.   Per HPI unless specifically indicated above     Objective:    BP 137/80 (BP Location: Right Arm, Patient Position: Sitting, Cuff Size: Normal)   Pulse 91   Temp (!) 97.3 F (36.3 C) (Oral)   Ht 5\' 5"  (1.651 m)   Wt 173 lb 9.6 oz (78.7 kg)   SpO2 94%   BMI 28.89 kg/m   Wt Readings from Last 3 Encounters:  05/08/19 173 lb 9.6 oz (78.7 kg)  04/17/19 173 lb (78.5 kg)  05/26/18 161 lb 1 oz (73.1 kg)    Physical Exam Vitals reviewed.  Constitutional:      Appearance: Normal appearance. She is well-developed and normal weight.  HENT:     Head: Normocephalic.     Mouth/Throat:     Mouth: Mucous membranes are moist.     Pharynx: Oropharynx is clear. No pharyngeal swelling.  Eyes:     Extraocular Movements: Extraocular movements intact.     Pupils: Pupils are equal, round, and  reactive to light.  Neck:     Thyroid: No thyromegaly.  Cardiovascular:     Rate and Rhythm: Normal rate and regular rhythm.     Pulses: Normal pulses. No decreased pulses.          Dorsalis pedis pulses are 2+ on the right side and 2+ on the left side.       Posterior tibial pulses are 2+ on the right side and 2+ on the left side.     Heart sounds: Normal heart sounds. No murmur. No friction rub. No gallop.   Pulmonary:     Effort: Pulmonary effort is normal. No respiratory distress.     Breath sounds: Normal breath sounds.  Chest:     Chest wall: No mass or  tenderness.  Abdominal:     General: Bowel sounds are normal.     Palpations: Abdomen is soft.     Tenderness: There is no abdominal tenderness.  Musculoskeletal:     Cervical back: Normal range of motion and neck supple.     Right lower leg: 2+ Edema present.     Left lower leg: 2+ Edema present.  Lymphadenopathy:     Cervical: No cervical adenopathy.  Skin:    General: Skin is warm.     Capillary Refill: Capillary refill takes less than 2 seconds.  Neurological:     Mental Status: She is alert and oriented to person, place, and time.     Cranial Nerves: No cranial nerve deficit.     Motor: No weakness.  Psychiatric:        Attention and Perception: Attention and perception normal.        Mood and Affect: Mood and affect normal.        Speech: Speech normal.        Behavior: Behavior normal. Behavior is cooperative.        Thought Content: Thought content normal.        Cognition and Memory: Cognition normal.        Judgment: Judgment normal.    Results for orders placed or performed during the hospital encounter of 05/31/18  HIV antibody (Routine Testing)  Result Value Ref Range   HIV Screen 4th Generation wRfx Non Reactive Non Reactive  Basic metabolic panel  Result Value Ref Range   Sodium 141 135 - 145 mmol/L   Potassium 4.1 3.5 - 5.1 mmol/L   Chloride 108 98 - 111 mmol/L   CO2 26 22 - 32 mmol/L   Glucose, Bld 142 (H) 70 - 99 mg/dL   BUN 14 8 - 23 mg/dL   Creatinine, Ser 9.24 0.44 - 1.00 mg/dL   Calcium 8.6 (L) 8.9 - 10.3 mg/dL   GFR calc non Af Amer >60 >60 mL/min   GFR calc Af Amer >60 >60 mL/min   Anion gap 7 5 - 15  Magnesium  Result Value Ref Range   Magnesium 2.3 1.7 - 2.4 mg/dL  Phosphorus  Result Value Ref Range   Phosphorus 3.4 2.5 - 4.6 mg/dL  CBC  Result Value Ref Range   WBC 16.2 (H) 4.0 - 10.5 K/uL   RBC 4.23 3.87 - 5.11 MIL/uL   Hemoglobin 12.7 12.0 - 15.0 g/dL   HCT 26.8 34.1 - 96.2 %   MCV 89.6 80.0 - 100.0 fL   MCH 30.0 26.0 - 34.0 pg    MCHC 33.5 30.0 - 36.0 g/dL   RDW 22.9 79.8 - 92.1 %   Platelets 217 150 -  400 K/uL   nRBC 0.0 0.0 - 0.2 %  Surgical pathology  Result Value Ref Range   SURGICAL PATHOLOGY      Surgical Pathology CASE: [id number] PATIENT: Dayla Lakins Surgical Pathology Report     SPECIMEN SUBMITTED: A. Gallbladder  CLINICAL HISTORY: None provided  PRE-OPERATIVE DIAGNOSIS: Gallstones K80.20, umbilical hernia K42.9  POST-OPERATIVE DIAGNOSIS: Same as pre op     DIAGNOSIS: A.  GALLBLADDER; CHOLECYSTECTOMY: - CHRONIC CHOLECYSTITIS AND CHOLELITHIASIS.   GROSS DESCRIPTION: A. Labeled: Gallbladder Received: Formalin Size of specimen: 10.5 x 3.9 x 1.8 cm Specimen integrity: Intact External surface: Tan-green, smooth, and finely vascular with multifocal areas of attached adipose tissue Wall thickness: 0.1 cm (uniform) Mucosa: Tan and velvety.  No polyps or abnormalities are grossly identified. Cystic duct: 0.3 cm in length by 0.2 cm in diameter.  Patent.  No cystic duct lymph node is grossly identified. Bile present: Yes; yellow-green. Stones present: Yes; multiple black, friable stones (8.5 x 4.0 x 0.7 cm in aggregate) are present within the  lumen. Other findings: None grossly identified  Block summary: 1 - cystic duct surgical resection margin (inked blue, en face) and gallbladder wall    Final Diagnosis performed by Redmond Pulling, MD.   Electronically signed 06/01/2018 2:24:48PM The electronic signature indicates that the named Attending Pathologist has evaluated the specimen  Technical component performed at [facility], [address] [place], [place] [address] Lab: [phone] Dir: Jolene Schimke, MD, MMM  Professional component performed at [facility], [facility], [address] [place], [place], [place] [address] Lab: [phone] Dir: Georgiann Cocker. Rubinas, MD       Assessment & Plan:   Problem List Items Addressed This Visit      Other   Dyspnea    Six  minute walk test started, within 1 minute patient had desaturated to 81% with a heart rate of 154bpm.  Patient had continuous pulse ox and was ambulated by CMA.  Patient returned to exam room and recovered to 87-90% pulse ox and heart rate in 90's.  Labored breathing with walk test and unable to speak in full sentences.  Non labored breathing during exam or once recovered.  Discussion with patient that it is my recommendation to proceed to [facility] via EMS now for respiratory symptoms and bilateral lower leg swelling.  Patient declined and stated she needed to bring her car home and then would proceed to the ER.  Discussed concerns of low oxygenation and safety issues with driving, concerns for acute respiratory failure, if she did not recover from the shortness of breath, could lead to outcomes that are mild but up to and including death.  Patient verbalized understanding and stated she was going home and then would go to [facility].  Report called to Morrie Sheldon, Triage RN at [facility].    Case discussed with Dr. Althea Charon while patient was in clinic  No EKG machine available at time of clinic visit.      Leg swelling    See Dyspnea A/P         No orders of the defined types were placed in this encounter.     Follow up plan: Return in about 1 week (around 05/15/2019) for Follow up after evaluation of DOE/SOB with ER.   Charlaine Dalton, FNP Family Nurse Practitioner [facility] 05/08/2019, 3:29 PM

## 2019-05-08 NOTE — Patient Instructions (Signed)
As we discussed, we recommend you go to [facility] via EMS due to shortness of breath and low oxygenation levels.  Discussed benefits of proceeding via ambulance with EMS transport.  Patient declined.  Discussed risks of driving self with desaturations, shortness of breath, and history of respiratory failure in 2019, that can include risks up to death.  Patient verbalized understanding and proceeding against medical advice to drive her car home and states will then go to the ER for evaluation.  You will receive a survey after today's visit either digitally by e-mail or paper by [employer]. Your experiences and feedback matter to [place].  Please respond so we know how we are doing as we provide care for you.  Call us with any questions/concerns/needs.  It is my goal to be available to you for your health concerns.  Thanks for choosing me to be a partner in your healthcare needs!  Charlaine Dalton, FNP-C Family Nurse Practitioner [facility] Phone: [phone]

## 2019-05-08 NOTE — Assessment & Plan Note (Addendum)
Six minute walk test started, within 1 minute patient had desaturated to 81% with a heart rate of 154bpm.  Patient had continuous pulse ox and was ambulated by CMA.  Patient returned to exam room and recovered to 87-90% pulse ox and heart rate in 90's.  Labored breathing with walk test and unable to speak in full sentences.  Non labored breathing during exam or once recovered.  Discussion with patient that it is my recommendation to proceed to [facility] via EMS now for respiratory symptoms and bilateral lower leg swelling.  Patient declined and stated she needed to bring her car home and then would proceed to the ER.  Discussed concerns of low oxygenation and safety issues with driving, concerns for acute respiratory failure, if she did not recover from the shortness of breath, could lead to outcomes that are mild but up to and including death.  Patient verbalized understanding and stated she was going home and then would go to [facility].  Report called to Morrie Sheldon, Triage RN at [facility].    Case discussed with Dr. Althea Charon while patient was in clinic  No EKG machine available at time of clinic visit.

## 2019-05-09 ENCOUNTER — Encounter: Payer: Self-pay | Admitting: Internal Medicine

## 2019-05-09 ENCOUNTER — Other Ambulatory Visit: Payer: Self-pay

## 2019-05-09 ENCOUNTER — Other Ambulatory Visit: Payer: Self-pay | Admitting: Family Medicine

## 2019-05-09 ENCOUNTER — Observation Stay (HOSPITAL_BASED_OUTPATIENT_CLINIC_OR_DEPARTMENT_OTHER)
Admit: 2019-05-09 | Discharge: 2019-05-09 | Disposition: A | Discharge status: Home / Self Care | Payer: Medicare HMO | Attending: Internal Medicine | Admitting: Internal Medicine

## 2019-05-09 DIAGNOSIS — R0600 Dyspnea, unspecified: Secondary | ICD-10-CM | POA: Diagnosis not present

## 2019-05-09 DIAGNOSIS — K449 Diaphragmatic hernia without obstruction or gangrene: Secondary | ICD-10-CM

## 2019-05-09 DIAGNOSIS — J9601 Acute respiratory failure with hypoxia: Secondary | ICD-10-CM | POA: Diagnosis not present

## 2019-05-09 LAB — CBC
HCT: 43.2 % (ref 36.0–46.0)
Hemoglobin: 14.4 g/dL (ref 12.0–15.0)
MCH: 30.1 pg (ref 26.0–34.0)
MCHC: 33.3 g/dL (ref 30.0–36.0)
MCV: 90.4 fL (ref 80.0–100.0)
Platelets: 215 10*3/uL (ref 150–400)
RBC: 4.78 MIL/uL (ref 3.87–5.11)
RDW: 12.9 % (ref 11.5–15.5)
WBC: 7.7 10*3/uL (ref 4.0–10.5)
nRBC: 0 % (ref 0.0–0.2)

## 2019-05-09 LAB — BLOOD GAS, ARTERIAL
Acid-Base Excess: 4.4 mmol/L — ABNORMAL HIGH (ref 0.0–2.0)
Bicarbonate: 30.8 mmol/L — ABNORMAL HIGH (ref 20.0–28.0)
FIO2: 0.21
O2 Saturation: 93.2 %
Patient temperature: 37
pCO2 arterial: 52 mmHg — ABNORMAL HIGH (ref 32.0–48.0)
pH, Arterial: 7.38 (ref 7.350–7.450)
pO2, Arterial: 69 mmHg — ABNORMAL LOW (ref 83.0–108.0)

## 2019-05-09 LAB — BASIC METABOLIC PANEL
Anion gap: 8 (ref 5–15)
BUN: 11 mg/dL (ref 8–23)
CO2: 29 mmol/L (ref 22–32)
Calcium: 9.2 mg/dL (ref 8.9–10.3)
Chloride: 105 mmol/L (ref 98–111)
Creatinine, Ser: 0.82 mg/dL (ref 0.44–1.00)
GFR calc Af Amer: >60 mL/min (ref >60)
GFR calc non Af Amer: >60 mL/min (ref >60)
Glucose, Bld: 98 mg/dL (ref 70–99)
Potassium: 3.9 mmol/L (ref 3.5–5.1)
Sodium: 142 mmol/L (ref 135–145)

## 2019-05-09 LAB — ECHOCARDIOGRAM COMPLETE
Height: 66 in
Weight: 2737.23 oz

## 2019-05-09 LAB — TROPONIN I (HIGH SENSITIVITY): Troponin I (High Sensitivity): 4 ng/L (ref <18)

## 2019-05-09 MED ORDER — PERFLUTREN LIPID MICROSPHERE
1.0000 mL | INTRAVENOUS | Status: AC | PRN
  Administered 2019-05-09: 12:00:00 2 mL via INTRAVENOUS
  Filled 2019-05-09: qty 10

## 2019-05-09 NOTE — ED Notes (Signed)
Report called to steven rn floor nurse 

## 2019-05-09 NOTE — Progress Notes (Signed)
SATURATION QUALIFICATIONS: (This note is used to comply with regulatory documentation for home oxygen)  Patient Saturations on Room Air at Rest = 92%  Patient Saturations on Room Air while Ambulating = 90%   

## 2019-05-09 NOTE — Progress Notes (Signed)
Received call from Dr. Allena Katz at [facility].  Ms. Thomaston is going to be discharged home today, is doing well on room air, her bilateral lower leg edema has resolved with wearing compression stockings.  They found a paraesophageal hernia on imaging that they believe is causing her current symptoms.  Dr. Allena Katz let me know that Ms. Mills wants to see about having this surgically repaired.  I notified chronic care management and put in a referral to Gastroenterology for evaluation and treatment.

## 2019-05-09 NOTE — Progress Notes (Signed)
*  PRELIMINARY RESULTS* Echocardiogram 2D Echocardiogram has been performed.  Katrina Sutton Yesha Muchow 05/09/2019, 11:52 AM

## 2019-05-09 NOTE — Progress Notes (Signed)
Pt discharged per MD order. IV removed. Discharge instructions reviewed with pt. Pt verbalized understanding with all questions answered to her satisfaction. Pt taken to car in wheelchair.

## 2019-05-09 NOTE — Care Management Obs Status (Signed)
MEDICARE OBSERVATION STATUS NOTIFICATION   Patient Details  Name: Katrina Sutton MRN: [id number] Date of Birth: February 17, 1951   Medicare Observation Status Notification Given:  No(admitted obs less than 24 hours)    Chapman Fitch, RN 05/09/2019, 1:33 PM

## 2019-05-09 NOTE — Discharge Summary (Signed)
[facility] [place] at [facility]   PATIENT NAME: Katrina Sutton    MR#:  [id number]  DATE OF BIRTH:  1950-10-11  DATE OF ADMISSION:  05/08/2019 ADMITTING PHYSICIAN: Charlsie Quest, MD  DATE OF DISCHARGE: 05/08/2019  PRIMARY CARE PHYSICIAN: Tarri Fuller, FNP    ADMISSION DIAGNOSIS:  SOB (shortness of breath) [R06.02] Hypoxia [R09.02] Acute respiratory failure with hypoxia (HCC) [J96.01]  DISCHARGE DIAGNOSIS:  Shortness of breath with Hypoxia suspected due to Right sided atelectasis due to large known Paraesophageal hernia  SECONDARY DIAGNOSIS:   Past Medical History:  Diagnosis Date  . Allergy    seasonal  . Anxiety   . Arthritis    osteoarthritis  . Depression   . GERD (gastroesophageal reflux disease)   . Hiatal hernia     HOSPITAL COURSE:  Katrina Sutton is a 69 y.o. female with medical history significant for paraesophageal hernia, GERD, and depression/anxiety who presents to the ED for evaluation of progressive dyspnea and hypoxia.Patient states that she has been having progressive dyspnea on exertion which she says has been ongoing for about 1 year now.   Acute respiratory failure with hypoxia--unclear etiology other than compressive atlectasis on the right due to large paraesophagelal hernia -Chest x-ray and CTA without evidence of pneumonia or pulmonary embolism.  A stable large paraesophageal hernia is noted with compressive atelectasis at the right lung base.   -She has no known history of CHF and appears euvolemic on admission.  BNP was 57.  Lungs are clear to auscultation without crackles or wheezing.  She does report symptoms of sleep apnea.   -- echocardiogram showed EF 55-60% with no valvular abnormality or any evidence of Pulmonary HTN -ABG pao2 69 -pt did not meet Home oxygen requirement. -She denies any shortness of breath. Sets are more than 92% on room air. -consider Pulmonary out pt evaluation and surgical consult/opinion regarding her  hernia  Paraesophageal hernia: Chronic and stable large paraesophageal hernia with compressive atelectasis at the right lung base noted on CT. Needs referral at [facility] care center  Depression/anxiety: Continue paroxetine and as needed clonazepam with hold parameters.  Patient ambulated with nursing staff without any shortness of breath. She did not qualify for home oxygen. She advised to use incentive spirometer.  DVT prophylaxis: Lovenox Code Status: DNR, confirmed with patient Family Communication: Discussed with patient, she has discussed with family Disposition Plan:   discharge to home today Consults called: None   D/w Charyl Dancer FNP at [facility] medical clinic  And updated. She appreciated the call. CONSULTS OBTAINED:    DRUG ALLERGIES:   Allergies  Allergen Reactions  . Erythromycin Shortness Of Breath and Rash  . Duloxetine Palpitations    DISCHARGE MEDICATIONS:   Allergies as of 05/09/2019      Reactions   Erythromycin Shortness Of Breath, Rash   Duloxetine Palpitations      Medication List    TAKE these medications   acetaminophen 500 MG tablet Commonly known as: TYLENOL Take 500 mg by mouth every 8 (eight) hours as needed for headache.   clonazePAM 1 MG tablet Commonly known as: KLONOPIN Take 1 tablet (1 mg total) by mouth 2 (two) times daily as needed for anxiety.   clotrimazole-betamethasone cream Commonly known as: LOTRISONE   hydrocortisone cream 1 % Apply 1 application topically 2 (two) times daily as needed for itching.   loratadine 10 MG tablet Commonly known as: CLARITIN Take 10 mg by mouth daily as needed for  allergies.   nystatin powder Commonly known as: MYCOSTATIN/NYSTOP Apply topically 3 (three) times daily. For about 2 weeks, may use up to 4 weeks if need   omeprazole 20 MG capsule Commonly known as: PRILOSEC Take 1 capsule (20 mg total) by mouth 2 (two) times daily.   PARoxetine 40 MG tablet Commonly known as:  PAXIL TAKE 1 TABLET BY MOUTH DAILY   polyethylene glycol 17 g packet Commonly known as: MIRALAX / GLYCOLAX Take 17 g by mouth daily.   senna 8.6 MG Tabs tablet Commonly known as: SENOKOT Take 1 tablet by mouth daily as needed for mild constipation.   sodium chloride 0.65 % Soln nasal spray Commonly known as: OCEAN Place 1 spray into both nostrils as needed for congestion.       If you experience worsening of your admission symptoms, develop shortness of breath, life threatening emergency, suicidal or homicidal thoughts you must seek medical attention immediately by calling 911 or calling your MD immediately  if symptoms less severe.  You Must read complete instructions/literature along with all the possible adverse reactions/side effects for all the Medicines you take and that have been prescribed to you. Take any new Medicines after you have completely understood and accept all the possible adverse reactions/side effects.   Please note  You were cared for by a hospitalist during your hospital stay. If you have any questions about your discharge medications or the care you received while you were in the hospital after you are discharged, you can call the unit and asked to speak with the hospitalist on call if the hospitalist that took care of you is not available. Once you are discharged, your primary care physician will handle any further medical issues. Please note that NO REFILLS for any discharge medications will be authorized once you are discharged, as it is imperative that you return to your primary care physician (or establish a relationship with a primary care physician if you do not have one) for your aftercare needs so that they can reassess your need for medications and monitor your lab values. Today   SUBJECTIVE   Patient denies any shortness of breath. No leg swelling. Wears stockings on a regular basis. No history of asthma or COPD. Denies history of smoking.  VITAL  SIGNS:  Blood pressure (!) 143/88, pulse 82, temperature (!) 97.5 F (36.4 C), temperature source Oral, resp. rate 20, height 5\' 6"  (1.676 m), weight 77.6 kg, SpO2 90 %.  I/O:  No intake or output data in the 24 hours ending 05/09/19 1326  PHYSICAL EXAMINATION:  GENERAL:  69 y.o.-year-old patient lying in the bed with no acute distress.  EYES: Pupils equal, round, reactive to light and accommodation. No scleral icterus.  HEENT: Head atraumatic, normocephalic. Oropharynx and nasopharynx clear.  NECK:  Supple, no jugular venous distention. No thyroid enlargement, no tenderness.  LUNGS: Normal breath sounds bilaterally, no wheezing, rales,rhonchi or crepitation. No use of accessory muscles of respiration.  CARDIOVASCULAR: S1, S2 normal. No murmurs, rubs, or gallops.  ABDOMEN: Soft, non-tender, non-distended. Bowel sounds present. No organomegaly or mass.  EXTREMITIES: trace pedal edema, no cyanosis, or clubbing. compression socks + NEUROLOGIC: Cranial nerves II through XII are intact. Muscle strength 5/5 in all extremities. Sensation intact. Gait not checked.  PSYCHIATRIC:  patient is alert and oriented x 3.  SKIN: No obvious rash, lesion, or ulcer.   DATA REVIEW:   CBC  Recent Labs  Lab 05/09/19 0126  WBC 7.7  HGB 14.4  HCT 43.2  PLT 215    Chemistries  Recent Labs  Lab 05/09/19 0126  NA 142  K 3.9  CL 105  CO2 29  GLUCOSE 98  BUN 11  CREATININE 0.82  CALCIUM 9.2    Microbiology Results   Recent Results (from the past 240 hour(s))  Respiratory Panel by RT PCR (Flu A&B, Covid) - Nasopharyngeal Swab     Status: None   Collection Time: 05/08/19  9:20 PM   Specimen: Nasopharyngeal Swab  Result Value Ref Range Status   SARS Coronavirus 2 by RT PCR NEGATIVE NEGATIVE Final    Comment: (NOTE) SARS-CoV-2 target nucleic acids are NOT DETECTED. The SARS-CoV-2 RNA is generally detectable in upper respiratoy specimens during the acute phase of infection. The  lowest concentration of SARS-CoV-2 viral copies this assay can detect is 131 copies/mL. A negative result does not preclude SARS-Cov-2 infection and should not be used as the sole basis for treatment or other patient management decisions. A negative result may occur with  improper specimen collection/handling, submission of specimen other than nasopharyngeal swab, presence of viral mutation(s) within the areas targeted by this assay, and inadequate number of viral copies (<131 copies/mL). A negative result must be combined with clinical observations, patient history, and epidemiological information. The expected result is Negative. Fact Sheet for Patients:  https://www.moore.com/ Fact Sheet for Healthcare Providers:  https://www.young.biz/ This test is not yet ap proved or cleared by the [place] FDA and  has been authorized for detection and/or diagnosis of SARS-CoV-2 by FDA under an Emergency Use Authorization (EUA). This EUA will remain  in effect (meaning this test can be used) for the duration of the COVID-19 declaration under Section 564(b)(1) of the Act, 21 U.S.C. section 360bbb-3(b)(1), unless the authorization is terminated or revoked sooner.    Influenza A by PCR NEGATIVE NEGATIVE Final   Influenza B by PCR NEGATIVE NEGATIVE Final    Comment: (NOTE) The Xpert Xpress SARS-CoV-2/FLU/RSV assay is intended as an aid in  the diagnosis of influenza from Nasopharyngeal swab specimens and  should not be used as a sole basis for treatment. Nasal washings and  aspirates are unacceptable for Xpert Xpress SARS-CoV-2/FLU/RSV  testing. Fact Sheet for Patients: https://www.moore.com/ Fact Sheet for Healthcare Providers: https://www.young.biz/ This test is not yet approved or cleared by the [place] FDA and  has been authorized for detection and/or diagnosis of SARS-CoV-2 by  FDA under an Emergency  Use Authorization (EUA). This EUA will remain  in effect (meaning this test can be used) for the duration of the  Covid-19 declaration under Section 564(b)(1) of the Act, 21  U.S.C. section 360bbb-3(b)(1), unless the authorization is  terminated or revoked. Performed at [facility], [address] [place], [place] [address]     RADIOLOGY:  DG Chest 2 View  Result Date: 05/08/2019 CLINICAL DATA:  Shortness of breath. EXAM: CHEST - 2 VIEW COMPARISON:  10/01/2017. FINDINGS: Stable cardiomediastinal silhouette. No pneumothorax is noted. Stable large right-sided paraesophageal hernia is noted. No pleural effusion is noted. Stable minimal left basilar subsegmental atelectasis or scarring is noted. Bony thorax is unremarkable. IMPRESSION: Stable large right-sided paraesophageal hernia. Stable minimal left basilar subsegmental atelectasis or scarring. Electronically Signed   By: Lupita Raider M.D.   On: 05/08/2019 16:57   CT Angio Chest PE W and/or Wo Contrast  Result Date: 05/08/2019 CLINICAL DATA:  Worsening hypoxia and shortness of breath for several months, dyspnea on exertion EXAM: CT ANGIOGRAPHY CHEST WITH CONTRAST  TECHNIQUE: Multidetector CT imaging of the chest was performed using the standard protocol during bolus administration of intravenous contrast. Multiplanar CT image reconstructions and MIPs were obtained to evaluate the vascular anatomy. CONTRAST:  75mL OMNIPAQUE IOHEXOL 350 MG/ML SOLN COMPARISON:  05/08/2019, 03/10/2018 FINDINGS: Cardiovascular: This is a technically adequate evaluation of the pulmonary vasculature. There are no filling defects or pulmonary emboli. Heart is unremarkable without pericardial effusion. Normal caliber of the thoracic aorta without evidence of dissection. Aberrant origin of the right subclavian artery is identified, a frequent anatomic variant. Mediastinum/Nodes: No pathologic mediastinal or hilar adenopathy. There is a large paraesophageal hernia  resulting in significant compressive atelectasis of the right lower lobe, stable. Lungs/Pleura: Chronic compressive atelectasis of the right lower lobe. No acute airspace disease, effusion, or pneumothorax. Central airways are patent. Upper Abdomen: Large paraesophageal hernia with the majority of the stomach contained within the hernia sac, as well as portions of the transverse colon. No bowel obstruction. Remaining structures of the upper abdomen are unremarkable. Musculoskeletal: No acute or destructive bony lesions. Reconstructed images demonstrate no additional findings. Review of the MIP images confirms the above findings. IMPRESSION: 1. No evidence of pulmonary embolus. 2. Large paraesophageal hernia with chronic compressive atelectasis at the right lung base. 3. No acute intrathoracic process. Electronically Signed   By: Sharlet SalinaMichael  Brown M.D.   On: 05/08/2019 20:44   ECHOCARDIOGRAM COMPLETE  Result Date: 05/09/2019    ECHOCARDIOGRAM REPORT   Patient Name:   Jacklynn BarnacleJUNE T Harkless Date of Exam: 05/09/2019 Medical Rec #:  [id number]    Height:       66.0 in Accession #:    [id number]   Weight:       171.1 lb Date of Birth:  04/25/1950     BSA:          1.871 m Patient Age:    69 years     BP:           156/85 mmHg Patient Gender: F            HR:           79 bpm. Exam Location:  [facility] Procedure: 2D Echo, Color Doppler, Cardiac Doppler and Intracardiac            Opacification Agent Indications:     R06.00 Dyspnea  History:         Patient has no prior history of Echocardiogram examinations. No                  medical history.  Sonographer:     Humphrey RollsJoan Heiss RDCS (AE) Referring Phys:  [id number] Katilynn Sinkler Diagnosing Phys: Debbe OdeaBrian Agbor-Etang MD  Sonographer Comments: Suboptimal apical window and suboptimal subcostal window. IMPRESSIONS  1. Left ventricular ejection fraction, by estimation, is 60 to 65%. The left ventricle has normal function. The left ventricle has no regional wall motion abnormalities. Left ventricular diastolic  parameters were normal.  2. Right ventricular systolic function is normal. The right ventricular size is normal.  3. The mitral valve is normal in structure and function. No evidence of mitral valve regurgitation.  4. The aortic valve is tricuspid. Aortic valve regurgitation is trivial. FINDINGS  Left Ventricle: Left ventricular ejection fraction, by estimation, is 60 to 65%. The left ventricle has normal function. The left ventricle has no regional wall motion abnormalities. Definity contrast agent was given IV to delineate the left ventricular  endocardial borders. The left ventricular internal cavity size was normal in size. There  is no left ventricular hypertrophy. Left ventricular diastolic parameters were normal. Right Ventricle: The right ventricular size is normal. Right vetricular wall thickness was not assessed. Right ventricular systolic function is normal. Left Atrium: Left atrial size was normal in size. Right Atrium: Right atrial size was normal in size. Pericardium: There is no evidence of pericardial effusion. Mitral Valve: The mitral valve is normal in structure and function. No evidence of mitral valve regurgitation. MV peak gradient, 1.7 mmHg. The mean mitral valve gradient is 1.0 mmHg. Tricuspid Valve: The tricuspid valve is normal in structure. Tricuspid valve regurgitation is not demonstrated. Aortic Valve: The aortic valve is tricuspid. Aortic valve regurgitation is trivial. Aortic valve mean gradient measures 2.0 mmHg. Aortic valve peak gradient measures 4.5 mmHg. Aortic valve area, by VTI measures 2.11 cm. Pulmonic Valve: The pulmonic valve was not well visualized. Pulmonic valve regurgitation is not visualized. Aorta: The aortic root is normal in size and structure. Venous: The inferior vena cava was not well visualized. IAS/Shunts: No atrial level shunt detected by color flow Doppler.  LEFT VENTRICLE PLAX 2D LVIDd:         3.44 cm  Diastology LVIDs:         2.07 cm  LV e' lateral:   5.00  cm/s LV PW:         0.89 cm  LV E/e' lateral: 13.3 LV IVS:        0.78 cm  LV e' medial:    5.11 cm/s LVOT diam:     2.10 cm  LV E/e' medial:  13.0 LV SV:         40 LV SV Index:   21 LVOT Area:     3.46 cm  LEFT ATRIUM         Index LA diam:    1.80 cm 0.96 cm/m  AORTIC VALVE                   PULMONIC VALVE AV Area (Vmax):    2.24 cm    PV Vmax:       0.98 m/s AV Area (Vmean):   2.01 cm    PV Vmean:      60.100 cm/s AV Area (VTI):     2.11 cm    PV VTI:        0.173 m AV Vmax:           106.00 cm/s PV Peak grad:  3.8 mmHg AV Vmean:          74.000 cm/s PV Mean grad:  2.0 mmHg AV VTI:            0.189 m AV Peak Grad:      4.5 mmHg AV Mean Grad:      2.0 mmHg LVOT Vmax:         68.70 cm/s LVOT Vmean:        42.900 cm/s LVOT VTI:          0.115 m LVOT/AV VTI ratio: 0.61  AORTA Ao Root diam: 2.90 cm MITRAL VALVE               TRICUSPID VALVE MV Area (PHT): 3.37 cm    TR Peak grad:   25.4 mmHg MV Peak grad:  1.7 mmHg    TR Vmax:        252.00 cm/s MV Mean grad:  1.0 mmHg MV Vmax:       0.66 m/s    SHUNTS MV Vmean:  43.6 cm/s   Systemic VTI:  0.12 m MV Decel Time: 225 msec    Systemic Diam: 2.10 cm MV E velocity: 66.50 cm/s MV A velocity: 68.90 cm/s MV E/A ratio:  0.97 Kate Sable MD Electronically signed by Kate Sable MD Signature Date/Time: 05/09/2019/1:01:58 PM    Final      CODE STATUS:     Code Status Orders  (From admission, onward)         Start     Ordered   05/08/19 2351  Do not attempt resuscitation (DNR)  Continuous    Question Answer Comment  In the event of cardiac or respiratory ARREST Do not call a "code blue"   In the event of cardiac or respiratory ARREST Do not perform Intubation, CPR, defibrillation or ACLS   In the event of cardiac or respiratory ARREST Use medication by any route, position, wound care, and other measures to relive pain and suffering. May use oxygen, suction and manual treatment of airway obstruction as needed for comfort.      05/08/19 2352         Code Status History    Date Active Date Inactive Code Status Order ID Comments User Context   05/31/2018 1427 06/01/2018 1644 Full Code [id number]  Fredirick Maudlin, MD Inpatient   03/11/2018 0452 03/11/2018 1652 Full Code [id number]  Harrie Foreman, MD Inpatient   Advance Care Planning Activity       TOTAL TIME TAKING CARE OF THIS PATIENT: *35* minutes.    Fritzi Mandes M.D  [facility]    CC: Primary care physician; Lorine Bears Lupita Raider, FNP

## 2019-05-10 ENCOUNTER — Ambulatory Visit: Payer: Self-pay

## 2019-05-10 NOTE — Chronic Care Management (AMB) (Signed)
  Care Management   Follow Up Note   05/10/2019 Name: Katrina Sutton MRN: [id number] DOB: [birth year]  Referred by: Tarri Fuller, FNP Reason for referral : Care Coordination   [name] is a 69 y.o. year old female who is a primary care patient of Tarri Fuller, FNP. The care management team was consulted for assistance with care management and care coordination needs.    Review of patient status, including review of consultants reports, relevant laboratory and other test results, and collaboration with appropriate care team members and the patient's provider was performed as part of comprehensive patient evaluation and provision of chronic care management services.    LCSW completed outreach attempts today to patient's mobile and house number and both calls were answered and then dropped automatically. LCSW was unable to leave a voice message.  A HIPPA compliant phone message was unable to be left for the patient as phone call was dropped. LCSW rescheduled CCM Social Work Appointment.  Dickie La, BSW, MSW, LCSW [facility]  [facility] [email or web address] Phone: [phone]

## 2019-05-11 ENCOUNTER — Other Ambulatory Visit: Payer: Self-pay | Admitting: Nurse Practitioner

## 2019-05-11 DIAGNOSIS — F419 Anxiety disorder, unspecified: Secondary | ICD-10-CM

## 2019-05-15 ENCOUNTER — Telehealth: Payer: Self-pay

## 2019-05-17 ENCOUNTER — Telehealth: Payer: Self-pay | Admitting: General Practice

## 2019-05-17 ENCOUNTER — Ambulatory Visit: Payer: Self-pay | Admitting: General Practice

## 2019-05-17 DIAGNOSIS — F419 Anxiety disorder, unspecified: Secondary | ICD-10-CM

## 2019-05-17 DIAGNOSIS — F319 Bipolar disorder, unspecified: Secondary | ICD-10-CM

## 2019-05-17 DIAGNOSIS — K449 Diaphragmatic hernia without obstruction or gangrene: Secondary | ICD-10-CM

## 2019-05-17 DIAGNOSIS — F418 Other specified anxiety disorders: Secondary | ICD-10-CM

## 2019-05-17 DIAGNOSIS — F3289 Other specified depressive episodes: Secondary | ICD-10-CM

## 2019-05-17 NOTE — Chronic Care Management (AMB) (Signed)
Chronic Care Management   Follow Up Note   05/17/2019 Name: Kieren NAKARI BRACKNELL MRN: [id number] DOB: 29-May-1950  Referred by: Verl Bangs, FNP Reason for referral : Chronic Care Management (F/U discharge from the hospital/Anxiety/Depression/paraesophageal hernia)   Soua T Depaolis is a 69 y.o. year old female who is a primary care patient of Verl Bangs, FNP. The CCM team was consulted for assistance with chronic disease management and care coordination needs.    Review of patient status, including review of consultants reports, relevant laboratory and other test results, and collaboration with appropriate care team members and the patient's provider was performed as part of comprehensive patient evaluation and provision of chronic care management services.    SDOH (Social Determinants of Health) assessments performed: Yes See Care Plan activities for detailed interventions related to SDOH)  SDOH Interventions     Most Recent Value  SDOH Interventions  SDOH Interventions for the Following Domains  Stress  Stress Interventions  Provide Counseling, Other (Comment) [LCSW involved in care, ongoing support]       Outpatient Encounter Medications as of 05/17/2019  Medication Sig Note  . acetaminophen (TYLENOL) 500 MG tablet Take 500 mg by mouth every 8 (eight) hours as needed for headache.   . clonazePAM (KLONOPIN) 1 MG tablet TAKE 1 TABLET BY MOUTH TWICE A DAY AS NEEDED FOR ANXIETY   . clotrimazole-betamethasone (LOTRISONE) cream    . hydrocortisone cream 1 % Apply 1 application topically 2 (two) times daily as needed for itching.   . nystatin (MYCOSTATIN/NYSTOP) powder Apply topically 3 (three) times daily. For about 2 weeks, may use up to 4 weeks if need   . omeprazole (PRILOSEC) 20 MG capsule Take 1 capsule (20 mg total) by mouth 2 (two) times daily. 05/17/2019: The patient is taking 40mg  in am and 2 20mg  at night. Needs clarification on how to take   . PARoxetine (PAXIL) 40 MG tablet TAKE 1  TABLET BY MOUTH DAILY   . polyethylene glycol (MIRALAX / GLYCOLAX) packet Take 17 g by mouth daily.   Marland Kitchen senna (SENOKOT) 8.6 MG TABS tablet Take 1 tablet by mouth daily as needed for mild constipation.   . sodium chloride (OCEAN) 0.65 % SOLN nasal spray Place 1 spray into both nostrils as needed for congestion.   Marland Kitchen loratadine (CLARITIN) 10 MG tablet Take 10 mg by mouth daily as needed for allergies.    No facility-administered encounter medications on file as of 05/17/2019.     Objective:   Goals Addressed            This Visit's Progress   . COMPLETED: RN- I would like to talk with the SW about my stress (pt-stated)       Current Barriers: Goal completed- see updated care plan . Chronic Disease Management support and education needs related to anxiety and depression  Nurse Case Manager Clinical Goal(s):  Marland Kitchen Over the next 30 days, patient will work with CM clinical social worker to develop ways to manage stress and anxiety  Interventions:  . Discussed plans with patient for ongoing care management follow up and provided patient with direct contact information for care management team . Social Work referral for anxiety and depression . Discussed with patient her home life and support system.  . Patient stated she was put out of work on disability in 2002 for mental health reasons.  . Patient stated she has one son with learning disabilities (43y/o)who lives in the home  . Discussed  patient's physical health which patient reports as very good, she did report a fall in February where she broke her nose. Patient denies the need for a cane or walker, denies unsteadiness.   Patient Self Care Activities:  . Currently UNABLE TO independently manage stress and anxiety.   Initial goal documentation     . RNCM: PT-"I am doing the best I can managing my care"       CARE PLAN ENTRY (see longtitudinal plan of care for additional care plan information)  Current Barriers:  . Chronic Disease  Management support, education, and care coordination needs related to Anxiety, Depression, Bipolar Disorder, and Paraesophageal hernia   Clinical Goal(s) related to Anxiety, Depression, Bipolar Disorder, and Paraesophageal hernia :  Over the next 120 days, patient will:  . Work with the care management team to address educational, disease management, and care coordination needs  . Begin or continue self health monitoring activities as directed today  follow a soft diet, sit up straight to prevent choking hazard, monitor for s/s of dehydration . Call provider office for new or worsened signs and symptoms Oxygen saturation lower than established parameter, Shortness of breath, and New or worsened symptom related to Anxiety/depression/bipolar disorder or complications from Paraesophageal hernia as evidence by recent hospitalization due to hyoxia  . Call care management team with questions or concerns . Verbalize basic understanding of patient centered plan of care established today  Interventions related to Anxiety, Depression, Bipolar Disorder, and Paraesophageal hernia :  . Evaluation of current treatment plans and patient's adherence to plan as established by provider- the patient has a follow up for consultation for evaluation of paraesophageal hernia on 06-28-2019.  The office will call if there is an earlier appointment that becomes available.  . Assessed patient understanding of disease states- the patient states the doctor at the hospital told her the hernia has gotten so large that medication will no longer help and surgical intervention needs to be considered. The patient is having a hard time with becoming short of breath at night when trying to sleep. She is propping herself up and doing the best she can. She did not qualify for home oxygen but feels she needs it, especially at night. . Assessed patient's education and care coordination needs . Provided disease specific education to patient-  education on eating foods that are soft and easy to swallow, education on sitting straight up when eating to prevent choking, education on staying hydrated and monitor for s/s of dehydration. The patient is drinking "plenty of water", also she is drinking gatorade and Ensure at times. The patient states she is being careful when eating.  Steele Sizer with appropriate clinical care team members regarding patient needs: Ongoing support from LCSW for stress, anxiety, and depression. Pharmacy consult for recommendations and help with clarification of omeprazole dosage and recommendations . Collaboration with pcp regarding patients stated concerns with omeprazole dosage, breathing concerns, especially at night. Recommended the patient write down questions for pcp and take to post discharge appointment with Danielle Rankin, NP on 3/9.  The patient verbalized understanding.   Patient Self Care Activities related to Anxiety, Depression, Bipolar Disorder, and Paraesophageal hernia :  . Patient is unable to independently self-manage chronic health conditions  Initial goal documentation         Plan:   The care management team will reach out to the patient again over the next 30 to 60 days.    Alto Denver RN, MSN, CCM [facility]  [facility]  [facility] [employer] Mobile: [phone]

## 2019-05-17 NOTE — Patient Instructions (Signed)
Visit Information  Goals Addressed            This Visit's Progress   . COMPLETED: RN- I would like to talk with the SW about my stress (pt-stated)       Current Barriers: Goal completed- see updated care plan . Chronic Disease Management support and education needs related to anxiety and depression  Nurse Case Manager Clinical Goal(s):  Marland Kitchen Over the next 30 days, patient will work with CM clinical social worker to develop ways to manage stress and anxiety  Interventions:  . Discussed plans with patient for ongoing care management follow up and provided patient with direct contact information for care management team . Social Work referral for anxiety and depression . Discussed with patient her home life and support system.  . Patient stated she was put out of work on disability in 2002 for mental health reasons.  . Patient stated she has one son with learning disabilities (43y/o)who lives in the home  . Discussed patient's physical health which patient reports as very good, she did report a fall in February where she broke her nose. Patient denies the need for a cane or walker, denies unsteadiness.   Patient Self Care Activities:  . Currently UNABLE TO independently manage stress and anxiety.   Initial goal documentation     . RNCM: PT-"I am doing the best I can managing my care"       CARE PLAN ENTRY (see longtitudinal plan of care for additional care plan information)  Current Barriers:  . Chronic Disease Management support, education, and care coordination needs related to Anxiety, Depression, Bipolar Disorder, and Paraesophageal hernia   Clinical Goal(s) related to Anxiety, Depression, Bipolar Disorder, and Paraesophageal hernia :  Over the next 120 days, patient will:  . Work with the care management team to address educational, disease management, and care coordination needs  . Begin or continue self health monitoring activities as directed today  follow a soft diet, sit  up straight to prevent choking hazard, monitor for s/s of dehydration . Call provider office for new or worsened signs and symptoms Oxygen saturation lower than established parameter, Shortness of breath, and New or worsened symptom related to Anxiety/depression/bipolar disorder or complications from Paraesophageal hernia as evidence by recent hospitalization due to hyoxia  . Call care management team with questions or concerns . Verbalize basic understanding of patient centered plan of care established today  Interventions related to Anxiety, Depression, Bipolar Disorder, and Paraesophageal hernia :  . Evaluation of current treatment plans and patient's adherence to plan as established by provider- the patient has a follow up for consultation for evaluation of paraesophageal hernia on 06-28-2019.  The office will call if there is an earlier appointment that becomes available.  . Assessed patient understanding of disease states- the patient states the doctor at the hospital told her the hernia has gotten so large that medication will no longer help and surgical intervention needs to be considered. The patient is having a hard time with becoming short of breath at night when trying to sleep. She is propping herself up and doing the best she can. She did not qualify for home oxygen but feels she needs it, especially at night. . Assessed patient's education and care coordination needs . Provided disease specific education to patient- education on eating foods that are soft and easy to swallow, education on sitting straight up when eating to prevent choking, education on staying hydrated and monitor for s/s of dehydration.  The patient is drinking "plenty of water", also she is drinking gatorade and Ensure at times. The patient states she is being careful when eating.  Nash Dimmer with appropriate clinical care team members regarding patient needs: Ongoing support from LCSW for stress, anxiety, and  depression. Pharmacy consult for recommendations and help with clarification of omeprazole dosage and recommendations . Collaboration with pcp regarding patients stated concerns with omeprazole dosage, breathing concerns, especially at night. Recommended the patient write down questions for pcp and take to post discharge appointment with Cyndia Skeeters, NP on 3/9.  The patient verbalized understanding.   Patient Self Care Activities related to Anxiety, Depression, Bipolar Disorder, and Paraesophageal hernia :  . Patient is unable to independently self-manage chronic health conditions  Initial goal documentation        Patient verbalizes understanding of instructions provided today.   The care management team will reach out to the patient again over the next 30 to 60  days.   Noreene Larsson RN, MSN, [place] Mobile: [phone]

## 2019-05-21 ENCOUNTER — Ambulatory Visit: Payer: Self-pay | Admitting: General Practice

## 2019-05-21 DIAGNOSIS — K449 Diaphragmatic hernia without obstruction or gangrene: Secondary | ICD-10-CM

## 2019-05-21 DIAGNOSIS — F418 Other specified anxiety disorders: Secondary | ICD-10-CM

## 2019-05-21 DIAGNOSIS — F419 Anxiety disorder, unspecified: Secondary | ICD-10-CM

## 2019-05-21 NOTE — Patient Instructions (Signed)
Visit Information  Goals Addressed            This Visit's Progress    RNCM: PT-"I am doing the best I can managing my care"       CARE PLAN ENTRY (see longtitudinal plan of care for additional care plan information)  Current Barriers:   Chronic Disease Management support, education, and care coordination needs related to Anxiety, Depression, Bipolar Disorder, and Paraesophageal hernia   Clinical Goal(s) related to Anxiety, Depression, Bipolar Disorder, and Paraesophageal hernia :  Over the next 120 days, patient will:   Work with the care management team to address educational, disease management, and care coordination needs   Begin or continue self health monitoring activities as directed today  follow a soft diet, sit up straight to prevent choking hazard, monitor for s/s of dehydration  Call provider office for new or worsened signs and symptoms Oxygen saturation lower than established parameter, Shortness of breath, and New or worsened symptom related to Anxiety/depression/bipolar disorder or complications from Paraesophageal hernia as evidence by recent hospitalization due to hypoxia   Call care management team with questions or concerns  Verbalize basic understanding of patient centered plan of care established today  Interventions related to Anxiety, Depression, Bipolar Disorder, and Paraesophageal hernia :   Evaluation of current treatment plans and patient's adherence to plan as established by provider- the patient has a follow up for consultation for evaluation of paraesophageal hernia on 06-28-2019.  The office will call if there is an earlier appointment that becomes available.   Assessed patient understanding of disease states- the patient states the doctor at the hospital told her the hernia has gotten so large that medication will no longer help and surgical intervention needs to be considered. The patient is having a hard time with becoming short of breath at night  when trying to sleep. She is propping herself up and doing the best she can. She did not qualify for home oxygen but feels she needs it, especially at night.  Assessed patient's education and care coordination needs  Provided disease specific education to patient- education on eating foods that are soft and easy to swallow, education on sitting straight up when eating to prevent choking, education on staying hydrated and monitor for s/s of dehydration. The patient is drinking "plenty of water", also she is drinking gatorade and Ensure at times. The patient states she is being careful when eating.   Collaborated with appropriate clinical care team members regarding patient needs: Ongoing support from LCSW for stress, anxiety, and depression. Pharmacy consult for recommendations and help with clarification of omeprazole dosage and recommendations  Collaboration with pcp regarding patients stated concerns with omeprazole dosage, breathing concerns, especially at night. Recommended the patient write down questions for pcp and take to post discharge appointment with Danielle Rankin, NP on 3/9.  The patient verbalized understanding.   Education after the patient called and left a VM stating she did not feel she needed to come to see the pcp on 3/9.  The patient states she has not been eating fried foods and eating smaller portions and she feels so much better and she did not see why she should come in to see Joni Reining on 3/9.  Explained to the patient the need to keep the appointment due to being in the hospital and this would be a post hospital follow up and it would be good for pcp to evaluate her post discharge. The patient verbalized understanding and states she would  still come to the appointment on 3/9.  No other concerns at this time.   Patient Self Care Activities related to Anxiety, Depression, Bipolar Disorder, and Paraesophageal hernia :   Patient is unable to independently self-manage chronic health  conditions  Please see past updates related to this goal by clicking on the "Past Updates" button in the selected goal         Patient verbalizes understanding of instructions provided today.   The care management team will reach out to the patient again over the next 30 to 60 days.   Noreene Larsson RN, MSN, [place] Mobile: [phone]

## 2019-05-21 NOTE — Chronic Care Management (AMB) (Signed)
Chronic Care Management   Follow Up Note   05/21/2019 Name: Katrina Sutton MRN: [id number] DOB: Aug 05, 1950  Referred by: Verl Bangs, FNP Reason for referral : Chronic Care Management (The patient left a VM and ask for a call back about appointment on 3-9 for Chronic Disease management and care coordination needs)   Katrina Sutton is a 69 y.o. year old female who is a primary care patient of Verl Bangs, FNP. The CCM team was consulted for assistance with chronic disease management and care coordination needs.    Review of patient status, including review of consultants reports, relevant laboratory and other test results, and collaboration with appropriate care team members and the patient's provider was performed as part of comprehensive patient evaluation and provision of chronic care management services.    SDOH (Social Determinants of Health) assessments performed: No See Care Plan activities for detailed interventions related to [facility])     Outpatient Encounter Medications as of 05/21/2019  Medication Sig Note  . acetaminophen (TYLENOL) 500 MG tablet Take 500 mg by mouth every 8 (eight) hours as needed for headache.   . clonazePAM (KLONOPIN) 1 MG tablet TAKE 1 TABLET BY MOUTH TWICE A DAY AS NEEDED FOR ANXIETY   . clotrimazole-betamethasone (LOTRISONE) cream    . hydrocortisone cream 1 % Apply 1 application topically 2 (two) times daily as needed for itching.   . loratadine (CLARITIN) 10 MG tablet Take 10 mg by mouth daily as needed for allergies.   Marland Kitchen nystatin (MYCOSTATIN/NYSTOP) powder Apply topically 3 (three) times daily. For about 2 weeks, may use up to 4 weeks if need   . omeprazole (PRILOSEC) 20 MG capsule Take 1 capsule (20 mg total) by mouth 2 (two) times daily. 05/17/2019: The patient is taking 40mg  in am and 2 20mg  at night. Needs clarification on how to take   . PARoxetine (PAXIL) 40 MG tablet TAKE 1 TABLET BY MOUTH DAILY   . polyethylene glycol (MIRALAX / GLYCOLAX) packet  Take 17 g by mouth daily.   Marland Kitchen senna (SENOKOT) 8.6 MG TABS tablet Take 1 tablet by mouth daily as needed for mild constipation.   . sodium chloride (OCEAN) 0.65 % SOLN nasal spray Place 1 spray into both nostrils as needed for congestion.    No facility-administered encounter medications on file as of 05/21/2019.     Objective:   Goals Addressed            This Visit's Progress   . RNCM: PT-"I am doing the best I can managing my care"       CARE PLAN ENTRY (see longtitudinal plan of care for additional care plan information)  Current Barriers:  . Chronic Disease Management support, education, and care coordination needs related to Anxiety, Depression, Bipolar Disorder, and Paraesophageal hernia   Clinical Goal(s) related to Anxiety, Depression, Bipolar Disorder, and Paraesophageal hernia :  Over the next 120 days, patient will:  . Work with the care management team to address educational, disease management, and care coordination needs  . Begin or continue self health monitoring activities as directed today  follow a soft diet, sit up straight to prevent choking hazard, monitor for s/s of dehydration . Call provider office for new or worsened signs and symptoms Oxygen saturation lower than established parameter, Shortness of breath, and New or worsened symptom related to Anxiety/depression/bipolar disorder or complications from Paraesophageal hernia as evidence by recent hospitalization due to hypoxia  . Call care management team with questions  or concerns . Verbalize basic understanding of patient centered plan of care established today  Interventions related to Anxiety, Depression, Bipolar Disorder, and Paraesophageal hernia :  . Evaluation of current treatment plans and patient's adherence to plan as established by provider- the patient has a follow up for consultation for evaluation of paraesophageal hernia on 06-28-2019.  The office will call if there is an earlier appointment that  becomes available.  . Assessed patient understanding of disease states- the patient states the doctor at the hospital told her the hernia has gotten so large that medication will no longer help and surgical intervention needs to be considered. The patient is having a hard time with becoming short of breath at night when trying to sleep. She is propping herself up and doing the best she can. She did not qualify for home oxygen but feels she needs it, especially at night. . Assessed patient's education and care coordination needs . Provided disease specific education to patient- education on eating foods that are soft and easy to swallow, education on sitting straight up when eating to prevent choking, education on staying hydrated and monitor for s/s of dehydration. The patient is drinking "plenty of water", also she is drinking gatorade and Ensure at times. The patient states she is being careful when eating.  Steele Sizer with appropriate clinical care team members regarding patient needs: Ongoing support from LCSW for stress, anxiety, and depression. Pharmacy consult for recommendations and help with clarification of omeprazole dosage and recommendations . Collaboration with pcp regarding patients stated concerns with omeprazole dosage, breathing concerns, especially at night. Recommended the patient write down questions for pcp and take to post discharge appointment with Danielle Rankin, NP on 3/9.  The patient verbalized understanding.  . Education after the patient called and left a VM stating she did not feel she needed to come to see the pcp on 3/9.  The patient states she has not been eating fried foods and eating smaller portions and she feels so much better and she did not see why she should come in to see Joni Reining on 3/9.  Explained to the patient the need to keep the appointment due to being in the hospital and this would be a post hospital follow up and it would be good for pcp to evaluate her post  discharge. The patient verbalized understanding and states she would still come to the appointment on 3/9.  No other concerns at this time.   Patient Self Care Activities related to Anxiety, Depression, Bipolar Disorder, and Paraesophageal hernia :  . Patient is unable to independently self-manage chronic health conditions  Please see past updates related to this goal by clicking on the "Past Updates" button in the selected goal          Plan:   The care management team will reach out to the patient again over the next 30 to 60 days.    Alto Denver RN, MSN, CCM Community Care Coordinator [place]  [facility] [place] Mobile: [phone]

## 2019-05-22 ENCOUNTER — Other Ambulatory Visit: Payer: Self-pay

## 2019-05-22 ENCOUNTER — Ambulatory Visit (INDEPENDENT_AMBULATORY_CARE_PROVIDER_SITE_OTHER): Payer: Medicare HMO | Admitting: Family Medicine

## 2019-05-22 ENCOUNTER — Encounter: Payer: Self-pay | Admitting: Family Medicine

## 2019-05-22 VITALS — BP 136/85 | HR 77 | Temp 97.3°F | Resp 18 | Ht 66.0 in | Wt 172.8 lb

## 2019-05-22 DIAGNOSIS — K2100 Gastro-esophageal reflux disease with esophagitis, without bleeding: Secondary | ICD-10-CM | POA: Diagnosis not present

## 2019-05-22 DIAGNOSIS — K449 Diaphragmatic hernia without obstruction or gangrene: Secondary | ICD-10-CM

## 2019-05-22 MED ORDER — OMEPRAZOLE 20 MG PO CPDR: DELAYED_RELEASE_CAPSULE | ORAL | 1 refills | Status: DC

## 2019-05-22 NOTE — Progress Notes (Signed)
Subjective:    Patient ID: Katrina Sutton, female    DOB: [birth year], 69 y.o.   MRN: [id number]  Annelisa T Desena is a 69 y.o. female presenting on 05/22/2019 for Hospitalization Follow-up (Acute respiratory failure with hypoxia x 05/08/19)   HPI  HOSPITAL FOLLOW-UP VISIT  Hospital/Location: [facility] Date of Admission: 05/08/2019 Date of Discharge: 05/09/2019 Transitions of care telephone call: 05/10/19 staff reached out without answer  Reason for Admission: Acute respiratory failure with hypoxia Primary (+Secondary) Diagnosis: Acute respiratory failure with hypoxia, Paraesophageal hernia  FOLLOW-UP  - Hospital H&P and Discharge Summary have been reviewed - Patient presents today 14 days after recent hospitalization. Brief summary of recent course, patient had symptoms of shortness of breath, desaturations, DOE, and bilateral lower extremity swelling, hospitalized, evaluated and found to have a paraesophageal hernia that was causing symptoms. - New medications on discharge: None - Changes to current meds on discharge: None  - Today reports overall has done well after discharge. Symptoms of SOB, DOE, desaturations and bilateral lower extremity edema are much improved.  Social History   Tobacco Use  . Smoking status: Former Smoker    Quit date: 12/27/1976    Years since quitting: 42.4  . Smokeless tobacco: Never Used  Substance Use Topics  . Alcohol use: No  . Drug use: No    Review of Systems  Constitutional: Negative.   HENT: Negative.   Eyes: Negative.   Respiratory: Negative.   Cardiovascular: Negative.   Gastrointestinal: Negative.   Endocrine: Negative.   Genitourinary: Negative.   Musculoskeletal: Negative.   Skin: Negative.   Allergic/Immunologic: Negative.   Neurological: Negative.   Hematological: Negative.   Psychiatric/Behavioral: Negative.    Per HPI unless specifically indicated above  Outpatient Encounter Medications as of 05/22/2019  Medication Sig Note  .  acetaminophen (TYLENOL) 500 MG tablet Take 500 mg by mouth every 8 (eight) hours as needed for headache.   . clonazePAM (KLONOPIN) 1 MG tablet TAKE 1 TABLET BY MOUTH TWICE A DAY AS NEEDED FOR ANXIETY   . clotrimazole-betamethasone (LOTRISONE) cream    . hydrocortisone cream 1 % Apply 1 application topically 2 (two) times daily as needed for itching.   . nystatin (MYCOSTATIN/NYSTOP) powder Apply topically 3 (three) times daily. For about 2 weeks, may use up to 4 weeks if need   . omeprazole (PRILOSEC) 20 MG capsule Take 2 capsules in the morning and 1 capsule in the evening   . PARoxetine (PAXIL) 40 MG tablet TAKE 1 TABLET BY MOUTH DAILY   . polyethylene glycol (MIRALAX / GLYCOLAX) packet Take 17 g by mouth daily.   Marland Kitchen senna (SENOKOT) 8.6 MG TABS tablet Take 1 tablet by mouth daily as needed for mild constipation.   . sodium chloride (OCEAN) 0.65 % SOLN nasal spray Place 1 spray into both nostrils as needed for congestion.   . [DISCONTINUED] omeprazole (PRILOSEC) 20 MG capsule Take 1 capsule (20 mg total) by mouth 2 (two) times daily. 05/17/2019: The patient is taking 40mg  in am and 2 20mg  at night. Needs clarification on how to take   . loratadine (CLARITIN) 10 MG tablet Take 10 mg by mouth daily as needed for allergies.    No facility-administered encounter medications on file as of 05/22/2019.        Objective:    BP 136/85 (BP Location: Right Arm, Patient Position: Sitting, Cuff Size: Normal)   Pulse 77   Temp (!) 97.3 F (36.3 C) (Temporal)   Resp 18  Ht 5\' 6"  (1.676 m)   Wt 172 lb 12.8 oz (78.4 kg)   SpO2 95%   BMI 27.89 kg/m   Wt Readings from Last 3 Encounters:  05/22/19 172 lb 12.8 oz (78.4 kg)  05/09/19 171 lb 1.2 oz (77.6 kg)  05/08/19 173 lb 9.6 oz (78.7 kg)    Physical Exam Vitals reviewed.  Constitutional:      General: She is not in acute distress.    Appearance: Normal appearance. She is well-groomed and overweight. She is not ill-appearing or toxic-appearing.  HENT:      Head: Normocephalic.     Right Ear: Tympanic membrane, ear canal and external ear normal. There is no impacted cerumen.     Left Ear: Tympanic membrane, ear canal and external ear normal. There is no impacted cerumen.     Nose: Nose normal. No congestion or rhinorrhea.     Mouth/Throat:     Lips: Pink.     Mouth: Mucous membranes are moist.     Pharynx: Oropharynx is clear. Uvula midline. No oropharyngeal exudate or posterior oropharyngeal erythema.  Eyes:     General: Lids are normal. Vision grossly intact. No scleral icterus.       Right eye: No discharge.        Left eye: No discharge.     Extraocular Movements: Extraocular movements intact.     Conjunctiva/sclera: Conjunctivae normal.     Pupils: Pupils are equal, round, and reactive to light.  Neck:     Thyroid: No thyroid mass or thyromegaly.  Cardiovascular:     Rate and Rhythm: Normal rate and regular rhythm.     Pulses: Normal pulses.          Dorsalis pedis pulses are 2+ on the right side and 2+ on the left side.       Posterior tibial pulses are 2+ on the right side and 2+ on the left side.     Heart sounds: Normal heart sounds. No murmur. No friction rub. No gallop.   Pulmonary:     Effort: Pulmonary effort is normal. No respiratory distress.     Breath sounds: Normal breath sounds.  Abdominal:     General: Abdomen is flat. Bowel sounds are normal. There is no distension.     Palpations: Abdomen is soft. There is no hepatomegaly, splenomegaly or mass.     Tenderness: There is no abdominal tenderness. There is no guarding or rebound.     Hernia: No hernia is present.  Musculoskeletal:        General: Normal range of motion.     Cervical back: Normal range of motion and neck supple. No tenderness.     Right lower leg: No edema.     Left lower leg: No edema.  Feet:     Right foot:     Skin integrity: Skin integrity normal.     Left foot:     Skin integrity: Skin integrity normal.  Lymphadenopathy:     Cervical:  No cervical adenopathy.  Skin:    General: Skin is warm and dry.     Capillary Refill: Capillary refill takes less than 2 seconds.  Neurological:     General: No focal deficit present.     Mental Status: She is alert and oriented to person, place, and time.     Cranial Nerves: No cranial nerve deficit.     Sensory: No sensory deficit.     Motor: No weakness.  Coordination: Coordination normal.     Gait: Gait normal.     Deep Tendon Reflexes: Reflexes normal.  Psychiatric:        Attention and Perception: Attention and perception normal.        Mood and Affect: Mood and affect normal.        Speech: Speech normal.        Behavior: Behavior normal. Behavior is cooperative.        Thought Content: Thought content normal.        Cognition and Memory: Cognition and memory normal.        Judgment: Judgment normal.    Results for orders placed or performed during the hospital encounter of 05/08/19  Respiratory Panel by RT PCR (Flu A&B, Covid) - Nasopharyngeal Swab   Specimen: Nasopharyngeal Swab  Result Value Ref Range   SARS Coronavirus 2 by RT PCR NEGATIVE NEGATIVE   Influenza A by PCR NEGATIVE NEGATIVE   Influenza B by PCR NEGATIVE NEGATIVE  CBC  Result Value Ref Range   WBC 8.6 4.0 - 10.5 K/uL   RBC 5.07 3.87 - 5.11 MIL/uL   Hemoglobin 15.3 (H) 12.0 - 15.0 g/dL   HCT 33.8 25.0 - 53.9 %   MCV 88.4 80.0 - 100.0 fL   MCH 30.2 26.0 - 34.0 pg   MCHC 34.2 30.0 - 36.0 g/dL   RDW 76.7 34.1 - 93.7 %   Platelets 260 150 - 400 K/uL   nRBC 0.0 0.0 - 0.2 %  Basic metabolic panel  Result Value Ref Range   Sodium 137 135 - 145 mmol/L   Potassium 3.7 3.5 - 5.1 mmol/L   Chloride 101 98 - 111 mmol/L   CO2 25 22 - 32 mmol/L   Glucose, Bld 99 70 - 99 mg/dL   BUN 12 8 - 23 mg/dL   Creatinine, Ser 9.02 0.44 - 1.00 mg/dL   Calcium 9.4 8.9 - 40.9 mg/dL   GFR calc non Af Amer >60 >60 mL/min   GFR calc Af Amer >60 >60 mL/min   Anion gap 11 5 - 15  Brain natriuretic peptide  Result Value  Ref Range   B Natriuretic Peptide 59.0 0.0 - 100.0 pg/mL  CBC  Result Value Ref Range   WBC 7.7 4.0 - 10.5 K/uL   RBC 4.78 3.87 - 5.11 MIL/uL   Hemoglobin 14.4 12.0 - 15.0 g/dL   HCT 73.5 32.9 - 92.4 %   MCV 90.4 80.0 - 100.0 fL   MCH 30.1 26.0 - 34.0 pg   MCHC 33.3 30.0 - 36.0 g/dL   RDW 26.8 34.1 - 96.2 %   Platelets 215 150 - 400 K/uL   nRBC 0.0 0.0 - 0.2 %  Basic metabolic panel  Result Value Ref Range   Sodium 142 135 - 145 mmol/L   Potassium 3.9 3.5 - 5.1 mmol/L   Chloride 105 98 - 111 mmol/L   CO2 29 22 - 32 mmol/L   Glucose, Bld 98 70 - 99 mg/dL   BUN 11 8 - 23 mg/dL   Creatinine, Ser 2.29 0.44 - 1.00 mg/dL   Calcium 9.2 8.9 - 79.8 mg/dL   GFR calc non Af Amer >60 >60 mL/min   GFR calc Af Amer >60 >60 mL/min   Anion gap 8 5 - 15  Blood gas, arterial  Result Value Ref Range   FIO2 0.21    Delivery systems ROOM AIR    pH, Arterial 7.38 7.350 - 7.450  pCO2 arterial 52 (H) 32.0 - 48.0 mmHg   pO2, Arterial 69 (L) 83.0 - 108.0 mmHg   Bicarbonate 30.8 (H) 20.0 - 28.0 mmol/L   Acid-Base Excess 4.4 (H) 0.0 - 2.0 mmol/L   O2 Saturation 93.2 %   Patient temperature 37.0    Collection site RIGHT RADIAL    Sample type ARTERIAL DRAW    Allens test (pass/fail) PASS PASS  ECHOCARDIOGRAM COMPLETE  Result Value Ref Range   Weight 2,737.23 oz   Height 66 in   BP 156/85 mmHg  Troponin I (High Sensitivity)  Result Value Ref Range   Troponin I (High Sensitivity) 4 <18 ng/L  Troponin I (High Sensitivity)  Result Value Ref Range   Troponin I (High Sensitivity) 4 <18 ng/L      Assessment & Plan:   Problem List Items Addressed This Visit      Digestive   GERD (gastroesophageal reflux disease)   Relevant Medications   omeprazole (PRILOSEC) 20 MG capsule     Other   Paraesophageal hernia - Primary    Was diagnosed at [facility] while admitted for acute respiratory failure with hypoxia.  Has an appointment for evaluation with GI on 06/28/2019.  Has changed her dietary habits,  is chewing her foods more thoroughly and has dramatically decreased her caffeine intake.  Wearing compression stockings.  Symptoms much improved.  Confirmed will not change any medications or treatment plan at this time.  Will keep appointment with GI for evaluation and to discuss treatment options, up to and including surgery.         Meds ordered this encounter  Medications  . omeprazole (PRILOSEC) 20 MG capsule    Sig: Take 2 capsules in the morning and 1 capsule in the evening    Dispense:  270 capsule    Refill:  1      I have reviewed the admission H&P, hospital notes, discharge summary, discharge medication list, and have reconciled the current and discharge medications today.  Follow up plan: Return in about 6 months (around 11/22/2019) for Follow up on chronic conditions.  Charlaine Dalton, FNP-C Family Nurse Practitioner [facility] 05/22/2019, 1:29 PM

## 2019-05-22 NOTE — Patient Instructions (Signed)
Keep your scheduled appointment with Gastroenterology for 06/28/2019.  Contact me if you need any refills on medications at that time.  We will plan to touch base after your GI appointment to discuss if you need a pre-operative appointment or a follow up of chronic conditions.  You will receive a survey after today's visit either digitally by e-mail or paper by [employer]. Your experiences and feedback matter to [place].  Please respond so we know how we are doing as we provide care for you.  Call us with any questions/concerns/needs.  It is my goal to be available to you for your health concerns.  Thanks for choosing me to be a partner in your healthcare needs!  Charlaine Dalton, FNP-C Family Nurse Practitioner [facility] Phone: [phone]

## 2019-05-22 NOTE — Assessment & Plan Note (Signed)
Was diagnosed at [facility] while admitted for acute respiratory failure with hypoxia.  Has an appointment for evaluation with GI on 06/28/2019.  Has changed her dietary habits, is chewing her foods more thoroughly and has dramatically decreased her caffeine intake.  Wearing compression stockings.  Symptoms much improved.  Confirmed will not change any medications or treatment plan at this time.  Will keep appointment with GI for evaluation and to discuss treatment options, up to and including surgery.

## 2019-05-29 DIAGNOSIS — M1711 Unilateral primary osteoarthritis, right knee: Secondary | ICD-10-CM | POA: Diagnosis not present

## 2019-06-04 ENCOUNTER — Ambulatory Visit (INDEPENDENT_AMBULATORY_CARE_PROVIDER_SITE_OTHER): Payer: Medicare HMO | Admitting: Pharmacist

## 2019-06-04 DIAGNOSIS — F418 Other specified anxiety disorders: Secondary | ICD-10-CM

## 2019-06-04 DIAGNOSIS — K2100 Gastro-esophageal reflux disease with esophagitis, without bleeding: Secondary | ICD-10-CM

## 2019-06-04 NOTE — Patient Instructions (Signed)
Thank you allowing the Chronic Care Management Team to be a part of your care! It was a pleasure speaking with you today!     CCM (Chronic Care Management) Team    Alto Denver RN, MSN, CCM Nurse Care Coordinator  [phone]   Duanne Moron PharmD  Clinical Pharmacist  [phone]   Dickie La LCSW Clinical Social Worker [phone]  Visit Information  Goals Addressed            This Visit's Progress   . COMPLETED: PharmD - Medication review       CARE PLAN ENTRY (see longtitudinal plan of care for additional care plan information)   Current Barriers:  . Chronic Disease Management support, education, and care coordination needs related to GERD, anxiety and depression  Pharmacist Clinical Goal(s):  Katrina Sutton Over the next 1 day, patient will work with CM Pharmacist to complete medication review and address any needs identified.  Interventions: . Perform chart review - Patient seen by PCP on 3/9 for post-hospitalization follow-up o Patient instructed to take omeprazole 20 mg - 2 capsules in AM and 1 capsule in evening o Patient to keep appointment with GI on 4/15 for further evaluation . Comprehensive medication review performed; medication list updated in electronic medical record o Katrina Sutton reports she is doing well, eating her food more slowly and taking omeprazole as directed - Confirms taking omeprazole doses about 1 hour prior to breakfast and supper meals o Counsel on sedation/dizziness risk with clonazepam, particularly in combination with other medications that can cause sedation, including loratadine. Patient verbalizes understanding and denies any dizziness/sedation with use. o Reports takes loratadine only occasionally for seasonal allergies . Denies further medication questions/concerns at this time.  Patient Self Care Activities:  . Self administers medications as prescribed . Attends all scheduled provider appointments o Upcoming appointment with GI  on 4/15 . Calls pharmacy for medication refills . Calls provider office for new concerns or questions  Initial goal documentation        Patient verbalizes understanding of instructions provided today.   The patient has been provided with contact information for the care management team and has been advised to call with any health related questions or concerns.   Duanne Moron, PharmD, [facility] Clinical Pharmacist [facility] [employer] [phone]

## 2019-06-04 NOTE — Chronic Care Management (AMB) (Signed)
Chronic Care Management   Note  06/04/2019 Name: Katrina Sutton MRN: [id number] DOB: 09-29-1950   Subjective:   Katrina Sutton is Sutton 69 y.o. year old female who is Sutton primary care patient of Katrina Sutton, Katrina Gross, FNP. The CM team was consulted for assistance with chronic disease management and care coordination. [name] has Sutton past medical history including but not limited to GERD, paraesophageal hernia, anxiety, depression and seasonal allergies.  Receive referral from CCM Nurse Case Manager for medication management, particularly for questions about her omeprazole  Outreach to Katrina Sutton by phone today.  Review of patient status, including review of consultants reports, laboratory and other test data, was performed as part of comprehensive evaluation and provision of chronic care management services.    Objective:  Lab Results  Component Value Date   CREATININE 0.82 05/09/2019   CREATININE 0.95 05/08/2019   CREATININE 0.82 06/01/2018    BP Readings from Last 3 Encounters:  05/22/19 136/85  05/09/19 (!) 143/88  05/08/19 137/80    Allergies  Allergen Reactions  . Erythromycin Shortness Of Breath and Rash  . Duloxetine Palpitations    Medications Reviewed Today    Reviewed by Katrina Sutton, [facility] (Pharmacist) on 06/04/19 at 0929  Med List Status: <None>  Medication Order Taking? Sig Documenting Provider Last Dose Status Informant  acetaminophen (TYLENOL) 500 MG tablet [id number] Yes Take 500 mg by mouth every 8 (eight) hours as needed for headache. [provider] Taking Active Self  clonazePAM (KLONOPIN) 1 MG tablet [id number] Yes TAKE 1 TABLET BY MOUTH TWICE Sutton DAY AS NEEDED FOR ANXIETY Katrina Sutton, Katrina Gross, FNP Taking Active   clotrimazole-betamethasone Thurmond Butts) cream [id number] Yes  [provider] Taking Active Self           Med Note De Nurse, Katrina Sutton   Mon Jun 04, 2019  9:25 AM) As needed for redness  hydrocortisone cream 1 % [id number] Yes Apply 1  application topically 2 (two) times daily as needed for itching. [provider] Taking Active Self  loratadine (CLARITIN) 10 MG tablet [id number] No Take 10 mg by mouth daily as needed for allergies. [provider] Not Taking Active Self  nystatin (MYCOSTATIN/NYSTOP) powder [id number] Yes Apply topically 3 (three) times daily. For about 2 weeks, may use up to 4 weeks if need Katrina Cords, DO Taking Active Self  omeprazole (PRILOSEC) 20 MG capsule [id number] Yes Take 2 capsules in the morning and 1 capsule in the evening Katrina Fuller, FNP Taking Active   PARoxetine (PAXIL) 40 MG tablet [id number] Yes TAKE 1 TABLET BY MOUTH DAILY Katrina Cords, DO Taking Active Self  polyethylene glycol Eather Colas / GLYCOLAX) packet [id number] Yes Take 17 g by mouth daily as needed.  [provider] Taking Active Self  senna (SENOKOT) 8.6 MG TABS tablet [id number] No Take 1 tablet by mouth daily as needed for mild constipation. [provider] Not Taking Active Self  sodium chloride (OCEAN) 0.65 % SOLN nasal spray [id number] Yes Place 1 spray into both nostrils as needed for congestion. [provider] Taking Active Self           Assessment:   Goals Addressed            This Visit's Progress   . COMPLETED: PharmD - Medication review       CARE PLAN ENTRY (see longtitudinal plan of care for additional care plan information)   Current Barriers:  . Chronic Disease Management support,  education, and care coordination needs related to GERD, anxiety and depression  Pharmacist Clinical Goal(s):  Marland Kitchen Over the next 1 day, patient will work with CM Pharmacist to complete medication review and address any needs identified.  Interventions: . Perform chart review - Patient seen by PCP on 3/9 for post-hospitalization follow-up o Patient instructed to take omeprazole 20 mg - 2 capsules in AM and 1 capsule in evening o Patient to keep appointment with  GI on 4/15 for further evaluation . Comprehensive medication review performed; medication list updated in electronic medical record o Ms. Elsberry reports she is doing well, eating her food more slowly and taking omeprazole as directed - Confirms taking omeprazole doses about 1 hour prior to breakfast and supper meals o Counsel on sedation/dizziness risk with clonazepam, particularly in combination with other medications that can cause sedation, including loratadine. Patient verbalizes understanding and denies any dizziness/sedation with use. o Reports takes loratadine only occasionally for seasonal allergies . Denies further medication questions/concerns at this time.  Patient Self Care Activities:  . Self administers medications as prescribed . Attends all scheduled provider appointments o Upcoming appointment with GI on 4/15 . Calls pharmacy for medication refills . Calls provider office for new concerns or questions  Initial goal documentation        Plan:  1) Patient denies any further medication questions/concerns at this time. 2) The patient has been provided with contact information for CM Pharmacist and has been advised to call with any medication related questions or concerns.   Katrina Sutton, PharmD, [place] [employer] [phone]

## 2019-06-05 ENCOUNTER — Ambulatory Visit: Payer: Medicare HMO

## 2019-06-05 ENCOUNTER — Ambulatory Visit: Payer: Self-pay | Admitting: Family Medicine

## 2019-06-12 ENCOUNTER — Ambulatory Visit: Payer: Self-pay | Admitting: Licensed Clinical Social Worker

## 2019-06-12 DIAGNOSIS — F418 Other specified anxiety disorders: Secondary | ICD-10-CM

## 2019-06-12 DIAGNOSIS — K449 Diaphragmatic hernia without obstruction or gangrene: Secondary | ICD-10-CM

## 2019-06-12 DIAGNOSIS — F319 Bipolar disorder, unspecified: Secondary | ICD-10-CM

## 2019-06-12 DIAGNOSIS — F3289 Other specified depressive episodes: Secondary | ICD-10-CM

## 2019-06-12 DIAGNOSIS — K2100 Gastro-esophageal reflux disease with esophagitis, without bleeding: Secondary | ICD-10-CM

## 2019-06-12 DIAGNOSIS — F419 Anxiety disorder, unspecified: Secondary | ICD-10-CM

## 2019-06-12 NOTE — Chronic Care Management (AMB) (Signed)
Chronic Care Management    Clinical Social Work Follow Up Note  06/12/2019 Name: Katrina Sutton MRN: [id number] DOB: 04-29-1950  Katrina Sutton is a 69 y.o. year old female who is a primary care patient of Tarri Fuller, FNP. The CCM team was consulted for assistance with Mental Health Counseling and Resources.   Review of patient status, including review of consultants reports, other relevant assessments, and collaboration with appropriate care team members and the patient's provider was performed as part of comprehensive patient evaluation and provision of chronic care management services.    SDOH (Social Determinants of Health) assessments performed: Yes    Outpatient Encounter Medications as of 06/12/2019  Medication Sig Note  . acetaminophen (TYLENOL) 500 MG tablet Take 500 mg by mouth every 8 (eight) hours as needed for headache.   . clonazePAM (KLONOPIN) 1 MG tablet TAKE 1 TABLET BY MOUTH TWICE A DAY AS NEEDED FOR ANXIETY   . clotrimazole-betamethasone (LOTRISONE) cream  06/04/2019: As needed for redness  . hydrocortisone cream 1 % Apply 1 application topically 2 (two) times daily as needed for itching.   . loratadine (CLARITIN) 10 MG tablet Take 10 mg by mouth daily as needed for allergies.   Katrina Sutton nystatin (MYCOSTATIN/NYSTOP) powder Apply topically 3 (three) times daily. For about 2 weeks, may use up to 4 weeks if need   . omeprazole (PRILOSEC) 20 MG capsule Take 2 capsules in the morning and 1 capsule in the evening   . PARoxetine (PAXIL) 40 MG tablet TAKE 1 TABLET BY MOUTH DAILY   . polyethylene glycol (MIRALAX / GLYCOLAX) packet Take 17 g by mouth daily as needed.    . senna (SENOKOT) 8.6 MG TABS tablet Take 1 tablet by mouth daily as needed for mild constipation.   . sodium chloride (OCEAN) 0.65 % SOLN nasal spray Place 1 spray into both nostrils as needed for congestion.    No facility-administered encounter medications on file as of 06/12/2019.     Goals Addressed    . SW- I would  like to gain additional support to manage my anxiety better (pt-stated)       Current Barriers:  . Limited social support . Mental Health Concerns  . Social Isolation . Limited access to caregiver . Lacks knowledge of community resource: available mental health resources that she can utilize   Clinical Social Work Clinical Goal(s):  Katrina Sutton Over the next 120 days, client will work with SW to address concerns related to decreasing anxious symptoms by implementing additional mental health support and coping skills into her routine.   Interventions: . Patient interviewed and appropriate assessments performed . Provided mental health counseling with regard to patient was emotional throughout phone call due to her daily difficulty with mobility/weakness/balance. LCSW used active and reflective listening and implemented appropriate interventions to help suppport patient and her emotional needs. Advised patient to implement deep breathing/grounding/meditation/self-care exercises into her daily routine to combat stressors. Education provided.  . Discussed plans with patient for ongoing care management follow up and provided patient with direct contact information for care management team . Advised patient to implement deep breathing exercises into her routine whenever anxiety/stress arise to help bring her back to the present state.  . Assisted patient/caregiver with obtaining information about health plan benefits . Provided education and assistance to client regarding Advanced Directives. . Provided education to patient/caregiver regarding level of care options. . Referred patient to [facility] (mental health provider) for long term follow up and  therapy/counseling. PCP made a referral in August but [name] was never able to establish contact with patient after 3 unsuccessfully calls. LCSW provided patient with their contact information and encouraged her to contact them but patient declined  services as she reports that her depression/anxiety has improved. . Patient reports improvement with her bowels. . Patient reports that she has a strong social support network of family and friends. . Patient shares that her stomach issues trigger when anxiety arises. She continues to take stool softeners when needed. LCSW provided coping skill education for anxiety management. Patient was receptive to education provided. . Patient reports that she has been implementing more socialization into her routine which has been helpful. . Praise provided as patient has put in a lot of work by challenging her negative thinking. Patient shares that she is starting to speak up more for herself and asking for help when needed. Patient shares an example of this by asking her husband to help her with cleaning their home.  Patient Self Care Activities:  . Attends all scheduled provider appointments . Lacks social connections  Please see past updates related to this goal by clicking on the "Past Updates" button in the selected goal      Follow Up Plan: SW will follow up with patient by phone over the next quarter  Eula Fried, [place], [email or web address], [email or web address] Phone: [phone]

## 2019-06-13 ENCOUNTER — Other Ambulatory Visit: Payer: Self-pay | Admitting: Family Medicine

## 2019-06-13 DIAGNOSIS — F419 Anxiety disorder, unspecified: Secondary | ICD-10-CM

## 2019-06-13 NOTE — Telephone Encounter (Signed)
Personally reviewed [place] today, 06/13/19.  According to PMP aware, pt last received a refill on 05/15/2019.  Refill of her controlled substance will be provided today on 06/13/2019.

## 2019-06-19 ENCOUNTER — Ambulatory Visit (INDEPENDENT_AMBULATORY_CARE_PROVIDER_SITE_OTHER): Payer: Medicare HMO

## 2019-06-19 VITALS — Ht 66.0 in | Wt 174.0 lb

## 2019-06-19 DIAGNOSIS — Z0000 Encounter for general adult medical examination without abnormal findings: Secondary | ICD-10-CM

## 2019-06-19 NOTE — Patient Instructions (Signed)
Katrina Sutton , Thank you for taking time to come for your Medicare Wellness Visit. I appreciate your ongoing commitment to your health goals. Please review the following plan we discussed and let me know if I can assist you in the future.   Screening recommendations/referrals: Colonoscopy: declined  Mammogram: declined  Bone Density: declined  Recommended yearly ophthalmology/optometry visit for glaucoma screening and checkup Recommended yearly dental visit for hygiene and checkup  Vaccinations: Influenza vaccine: declined  Pneumococcal vaccine: declined  Tdap vaccine: up to date  Shingles vaccine: shingrix eligible declined    Covid-19: We are recommending the vaccine to everyone who has not had an allergic reaction to any of the components of the vaccine. If you have specific questions about the vaccine, please bring them up with your health care provider to discuss them.   We will likely not be getting the vaccine in the office for the first rounds of vaccinations. The way they are releasing the vaccines is going to be through the health systems (like [facility], [place], [facility], [facility]), through your county health department, or through the pharmacies.   The [facility] Department is giving vaccines to those 65+ and Health Care Workers Teachers and [place] providers start 05/09/19, Essential workers start 3/10 and those with co-morbidities start 06/06/19 Call [phone] to schedule  If you are 65+ you can get a vaccine through [facility] by signing up for an appointment.  You can sign up by going to: [email or web address].  You can get more information by going to: [email or web address]  [employer] next door is giving the [employer]- you can call [phone] or stop by there to schedule.  Advanced directives: Advance directive discussed with you today. Once this is complete please bring a copy in to our office so we can scan it into your  chart.  Conditions/risks identified: none  Next appointment: Follow up in one year for your annual wellness visit    Preventive Care 65 Years and Older, Female Preventive care refers to lifestyle choices and visits with your health care provider that can promote health and wellness. What does preventive care include?  A yearly physical exam. This is also called an annual well check.  Dental exams once or twice a year.  Routine eye exams. Ask your health care provider how often you should have your eyes checked.  Personal lifestyle choices, including:  Daily care of your teeth and gums.  Regular physical activity.  Eating a healthy diet.  Avoiding tobacco and drug use.  Limiting alcohol use.  Practicing safe sex.  Taking low-dose aspirin every day.  Taking vitamin and mineral supplements as recommended by your health care provider. What happens during an annual well check? The services and screenings done by your health care provider during your annual well check will depend on your age, overall health, lifestyle risk factors, and family history of disease. Counseling  Your health care provider may ask you questions about your:  Alcohol use.  Tobacco use.  Drug use.  Emotional well-being.  Home and relationship well-being.  Sexual activity.  Eating habits.  History of falls.  Memory and ability to understand (cognition).  Work and work Statistician.  Reproductive health. Screening  You may have the following tests or measurements:  Height, weight, and BMI.  Blood pressure.  Lipid and cholesterol levels. These may be checked every 5 years, or more frequently if you are over 47 years old.  Skin check.  Lung cancer screening. You may  have this screening every year starting at age 73 if you have a 30-pack-year history of smoking and currently smoke or have quit within the past 15 years.  Fecal occult blood test (FOBT) of the stool. You may have this  test every year starting at age 23.  Flexible sigmoidoscopy or colonoscopy. You may have a sigmoidoscopy every 5 years or a colonoscopy every 10 years starting at age 58.  Hepatitis C blood test.  Hepatitis B blood test.  Sexually transmitted disease (STD) testing.  Diabetes screening. This is done by checking your blood sugar (glucose) after you have not eaten for a while (fasting). You may have this done every 1-3 years.  Bone density scan. This is done to screen for osteoporosis. You may have this done starting at age 45.  Mammogram. This may be done every 1-2 years. Talk to your health care provider about how often you should have regular mammograms. Talk with your health care provider about your test results, treatment options, and if necessary, the need for more tests. Vaccines  Your health care provider may recommend certain vaccines, such as:  Influenza vaccine. This is recommended every year.  Tetanus, diphtheria, and acellular pertussis (Tdap, Td) vaccine. You may need a Td booster every 10 years.  Zoster vaccine. You may need this after age 100.  Pneumococcal 13-valent conjugate (PCV13) vaccine. One dose is recommended after age 2.  Pneumococcal polysaccharide (PPSV23) vaccine. One dose is recommended after age 54. Talk to your health care provider about which screenings and vaccines you need and how often you need them. This information is not intended to replace advice given to you by your health care provider. Make sure you discuss any questions you have with your health care provider. Document Released: 03/28/2015 Document Revised: 11/19/2015 Document Reviewed: 12/31/2014 Elsevier Interactive Patient Education  2017 [employer].  Fall Prevention in the Home Falls can cause injuries. They can happen to people of all ages. There are many things you can do to make your home safe and to help prevent falls. What can I do on the outside of my home?  Regularly fix the  edges of walkways and driveways and fix any cracks.  Remove anything that might make you trip as you walk through a door, such as a raised step or threshold.  Trim any bushes or trees on the path to your home.  Use bright outdoor lighting.  Clear any walking paths of anything that might make someone trip, such as rocks or tools.  Regularly check to see if handrails are loose or broken. Make sure that both sides of any steps have handrails.  Any raised decks and porches should have guardrails on the edges.  Have any leaves, snow, or ice cleared regularly.  Use sand or salt on walking paths during winter.  Clean up any spills in your garage right away. This includes oil or grease spills. What can I do in the bathroom?  Use night lights.  Install grab bars by the toilet and in the tub and shower. Do not use towel bars as grab bars.  Use non-skid mats or decals in the tub or shower.  If you need to sit down in the shower, use a plastic, non-slip stool.  Keep the floor dry. Clean up any water that spills on the floor as soon as it happens.  Remove soap buildup in the tub or shower regularly.  Attach bath mats securely with double-sided non-slip rug tape.  Do not  have throw rugs and other things on the floor that can make you trip. What can I do in the bedroom?  Use night lights.  Make sure that you have a light by your bed that is easy to reach.  Do not use any sheets or blankets that are too big for your bed. They should not hang down onto the floor.  Have a firm chair that has side arms. You can use this for support while you get dressed.  Do not have throw rugs and other things on the floor that can make you trip. What can I do in the kitchen?  Clean up any spills right away.  Avoid walking on wet floors.  Keep items that you use a lot in easy-to-reach places.  If you need to reach something above you, use a strong step stool that has a grab bar.  Keep  electrical cords out of the way.  Do not use floor polish or wax that makes floors slippery. If you must use wax, use non-skid floor wax.  Do not have throw rugs and other things on the floor that can make you trip. What can I do with my stairs?  Do not leave any items on the stairs.  Make sure that there are handrails on both sides of the stairs and use them. Fix handrails that are broken or loose. Make sure that handrails are as long as the stairways.  Check any carpeting to make sure that it is firmly attached to the stairs. Fix any carpet that is loose or worn.  Avoid having throw rugs at the top or bottom of the stairs. If you do have throw rugs, attach them to the floor with carpet tape.  Make sure that you have a light switch at the top of the stairs and the bottom of the stairs. If you do not have them, ask someone to add them for you. What else can I do to help prevent falls?  Wear shoes that:  Do not have high heels.  Have rubber bottoms.  Are comfortable and fit you well.  Are closed at the toe. Do not wear sandals.  If you use a stepladder:  Make sure that it is fully opened. Do not climb a closed stepladder.  Make sure that both sides of the stepladder are locked into place.  Ask someone to hold it for you, if possible.  Clearly mark and make sure that you can see:  Any grab bars or handrails.  First and last steps.  Where the edge of each step is.  Use tools that help you move around (mobility aids) if they are needed. These include:  Canes.  Walkers.  Scooters.  Crutches.  Turn on the lights when you go into a dark area. Replace any light bulbs as soon as they burn out.  Set up your furniture so you have a clear path. Avoid moving your furniture around.  If any of your floors are uneven, fix them.  If there are any pets around you, be aware of where they are.  Review your medicines with your doctor. Some medicines can make you feel dizzy.  This can increase your chance of falling. Ask your doctor what other things that you can do to help prevent falls. This information is not intended to replace advice given to you by your health care provider. Make sure you discuss any questions you have with your health care provider. Document Released: 12/26/2008 Document Revised: 08/07/2015 Document  Reviewed: 04/05/2014 Elsevier Interactive Patient Education  2017 [employer].

## 2019-06-19 NOTE — Progress Notes (Signed)
Subjective:   Katrina Sutton is a 69 y.o. female who presents for an Initial Medicare Annual Wellness Visit.  This visit is being conducted via phone call  - after an attmept to do on video chat - due to the COVID-19 pandemic. This patient has given me verbal consent via phone to conduct this visit, patient states they are participating from their home address. Some vital signs may be absent or patient reported.   Patient identification: identified by name, DOB, and current address.    Review of Systems      Cardiac Risk Factors include: advanced age (>64men, >13 women)     Objective:    Today's Vitals   06/19/19 1437  Weight: 174 lb (78.9 kg)  Height: 5\' 6"  (1.676 m)   Body mass index is 28.08 kg/m.  Advanced Directives 06/19/2019 05/09/2019 05/08/2019 05/31/2018 05/31/2018 05/26/2018 05/15/2018  Does Patient Have a Medical Advance Directive? No No No No No No No  Would patient like information on creating a medical advance directive? - No - Patient declined No - Patient declined No - Patient declined No - Patient declined No - Patient declined No - Patient declined    Current Medications (verified) Outpatient Encounter Medications as of 06/19/2019  Medication Sig  . acetaminophen (TYLENOL) 500 MG tablet Take 500 mg by mouth every 8 (eight) hours as needed for headache.  . clonazePAM (KLONOPIN) 1 MG tablet TAKE 1 TABLET BY MOUTH TWICE A DAY AS NEEDED FOR ANXIETY  . clotrimazole-betamethasone (LOTRISONE) cream   . hydrocortisone cream 1 % Apply 1 application topically 2 (two) times daily as needed for itching.  08/19/2019 omeprazole (PRILOSEC) 20 MG capsule Take 2 capsules in the morning and 1 capsule in the evening (Patient taking differently: 40 mg daily. )  . PARoxetine (PAXIL) 40 MG tablet TAKE 1 TABLET BY MOUTH DAILY  . polyethylene glycol (MIRALAX / GLYCOLAX) packet Take 17 g by mouth daily as needed.   . senna (SENOKOT) 8.6 MG TABS tablet Take 1 tablet by mouth daily as needed for mild  constipation.  . sodium chloride (OCEAN) 0.65 % SOLN nasal spray Place 1 spray into both nostrils as needed for congestion.  Marland Kitchen nystatin (MYCOSTATIN/NYSTOP) powder Apply topically 3 (three) times daily. For about 2 weeks, may use up to 4 weeks if need (Patient not taking: Reported on 06/19/2019)  . [DISCONTINUED] loratadine (CLARITIN) 10 MG tablet Take 10 mg by mouth daily as needed for allergies.   No facility-administered encounter medications on file as of 06/19/2019.    Allergies (verified) Erythromycin and Duloxetine   History: Past Medical History:  Diagnosis Date  . Allergy    seasonal  . Anxiety   . Arthritis    osteoarthritis  . Depression   . GERD (gastroesophageal reflux disease)   . Hiatal hernia    Past Surgical History:  Procedure Laterality Date  . CHOLECYSTECTOMY N/A 05/31/2018   Procedure: LAPAROSCOPIC CHOLECYSTECTOMY;  Surgeon: 06/02/2018, MD;  Location: [facility] ORS;  Service: General;  Laterality: N/A;  . TONSILLECTOMY    . UMBILICAL HERNIA REPAIR N/A 05/31/2018   Procedure: HERNIA REPAIR UMBILICAL ADULT;  Surgeon: 06/02/2018, MD;  Location: [facility] ORS;  Service: General;  Laterality: N/A;  . WRIST SURGERY Left    Family History  Problem Relation Age of Onset  . Mental illness Mother   . Ulcers Father   . Stroke Neg Hx   . Heart attack Neg Hx   . Cancer Neg Hx  Social History   Socioeconomic History  . Marital status: Married    Spouse name: Not on file  . Number of children: Not on file  . Years of education: Not on file  . Highest education level: Not on file  Occupational History  . Not on file  Tobacco Use  . Smoking status: Former Smoker    Quit date: 12/27/1976    Years since quitting: 42.5  . Smokeless tobacco: Never Used  Substance and Sexual Activity  . Alcohol use: No  . Drug use: No  . Sexual activity: Never    Birth control/protection: None  Other Topics Concern  . Not on file  Social History Narrative  . Not on file    Social Determinants of Health   Financial Resource Strain:   . Difficulty of Paying Living Expenses:   Food Insecurity: No Food Insecurity  . Worried About Programme researcher, broadcasting/film/video in the Last Year: Never true  . Ran Out of Food in the Last Year: Never true  Transportation Needs: No Transportation Needs  . Lack of Transportation (Medical): No  . Lack of Transportation (Non-Medical): No  Physical Activity:   . Days of Exercise per Week:   . Minutes of Exercise per Session:   Stress: Stress Concern Present  . Feeling of Stress : To some extent  Social Connections:   . Frequency of Communication with Friends and Family:   . Frequency of Social Gatherings with Friends and Family:   . Attends Religious Services:   . Active Member of Clubs or Organizations:   . Attends Banker Meetings:   Marland Kitchen Marital Status:     Tobacco Counseling Counseling given: Not Answered   Clinical Intake:  Pre-visit preparation completed: Yes  Pain : No/denies pain     Nutritional Status: BMI 25 -29 Overweight Nutritional Risks: None Diabetes: No  How often do you need to have someone help you when you read instructions, pamphlets, or other written materials from your doctor or pharmacy?: 1 - Never  Interpreter Needed?: No  Information entered by :: Joseph Johns,LPN   Activities of Daily Living In your present state of health, do you have any difficulty performing the following activities: 06/19/2019 05/09/2019  Hearing? N N  Comment no hearing aids, partially deaf in right ear, Dr.Bowers at [facility]. has amiplifier -  Vision? N N  Comment cataracts, thrumond eye -  Difficulty concentrating or making decisions? N N  Walking or climbing stairs? N N  Dressing or bathing? N N  Doing errands, shopping? N N  Preparing Food and eating ? N -  Using the Toilet? N -  In the past six months, have you accidently leaked urine? N -  Do you have problems with loss of bowel control? N -  Managing  your Medications? N -  Managing your Finances? N -  Housekeeping or managing your Housekeeping? N -  Some recent data might be hidden     Immunizations and Health Maintenance Immunization History  Administered Date(s) Administered  . Tdap 09/13/2015   Health Maintenance Due  Topic Date Due  . Hepatitis C Screening  Never done    Patient Care Team: Tarri Fuller, FNP as PCP - General (Family Medicine) Gustavus Bryant, LCSW as Social Worker (Licensed Clinical Social Worker) Arlana Pouch, Megan Mans, RN as Case Manager (General Practice) [place], Jackelyn Poling, [facility] as Pharmacist (Pharmacist)  Indicate any recent Medical Services you may have received from other than [facility] providers  in the past year (date may be approximate).     Assessment:   This is a routine wellness examination for Katianne.  Hearing/Vision screen No exam data present  Dietary issues and exercise activities discussed: Current Exercise Habits: Home exercise routine, Type of exercise: walking, Time (Minutes): 30, Frequency (Times/Week): 7, Weekly Exercise (Minutes/Week): 210, Intensity: Mild, Exercise limited by: None identified  Goals Addressed   None    Depression Screen PHQ 2/9 Scores 06/19/2019 05/22/2019 05/04/2019 11/01/2018 09/27/2018 05/02/2018 01/27/2018  PHQ - 2 Score 0 1 1 1  0 0 1  PHQ- 9 Score - 6 3 9 2 3 3     Fall Risk Fall Risk  04/17/2019 11/27/2018 05/17/2018 05/17/2018 03/27/2018  Falls in the past year? 0 1 1 0 0  Number falls in past yr: - 0 1 0 0  Injury with Fall? - 1 1 0 0  Risk for fall due to : - Other (Comment) - - -  Follow up - Falls prevention discussed - - -   FALL RISK PREVENTION PERTAINING TO THE HOME:  Any stairs in or around the home? Yes  If so, are there any without handrails? No   Home free of loose throw rugs in walkways, pet beds, electrical cords, etc? Yes  Adequate lighting in your home to reduce risk of falls? Yes   ASSISTIVE DEVICES UTILIZED TO PREVENT FALLS:  Life alert? no Use  of a cane, walker or w/c? No  Grab bars in the bathroom? No  Shower chair or bench in shower? No  Elevated toilet seat or a handicapped toilet? No    DME ORDERS:  DME order needed?  No   TIMED UP AND GO:  Unable to perform     Cognitive Function:        Screening Tests Health Maintenance  Topic Date Due  . Hepatitis C Screening  Never done  . MAMMOGRAM  06/18/2020 (Originally 03/22/1998)  . DEXA SCAN  06/18/2020 (Originally 03/22/2013)  . COLONOSCOPY  06/18/2020 (Originally 03/22/1998)  . PNA vac Low Risk Adult (1 of 2 - PCV13) 06/18/2020 (Originally 03/22/2013)  . INFLUENZA VACCINE  10/14/2019  . TETANUS/TDAP  09/12/2025    Qualifies for Shingles Vaccine? Yes  Zostavax completed n/a. Due for Shingrix. Education has been provided regarding the importance of this vaccine. Pt has been advised to call insurance company to determine out of pocket expense. Advised may also receive vaccine at local pharmacy or Health Dept. Verbalized acceptance and understanding.  Tdap: up to date  Flu Vaccine: declined  Pneumococcal Vaccine: declined  Covid-19 Vaccine:  Information provided  Cancer Screenings:  Colorectal Screening: declined  Mammogram: declined  Bone Density: declined  Lung Cancer Screening: (Low Dose CT Chest recommended if Age 55-80 years, 30 pack-year currently smoking OR have quit w/in 15years.) does not qualify.    Additional Screening:  Hepatitis C Screening: does qualify; will order for next labs   Vision Screening: Recommended annual ophthalmology exams for early detection of glaucoma and other disorders of the eye. Is the patient up to date with their annual eye exam?  Yes  Who is the provider or what is the name of the office in which the pt attends annual eye exams? [facility]    Dental Screening: Recommended annual dental exams for proper oral hygiene  Community Resource Referral:  CRR required this visit?  No       Plan:  I have personally  reviewed and addressed the Medicare Annual Wellness questionnaire  and have noted the following in the patient's chart:  A. Medical and social history B. Use of alcohol, tobacco or illicit drugs  C. Current medications and supplements D. Functional ability and status E.  Nutritional status F.  Physical activity G. Advance directives H. List of other physicians I.  Hospitalizations, surgeries, and ER visits in previous 12 months J.  [place] such as hearing and vision if needed, cognitive and depression L. Referrals and appointments   In addition, I have reviewed and discussed with patient certain preventive protocols, quality metrics, and best practice recommendations. A written personalized care plan for preventive services as well as general preventive health recommendations were provided to patient.   Signed,    Bevelyn Ngo, LPN   04/17/7626  Nurse Health Advisor   Nurse Notes: none

## 2019-06-28 ENCOUNTER — Telehealth: Payer: Self-pay

## 2019-06-28 ENCOUNTER — Ambulatory Visit: Payer: Medicare HMO | Admitting: Gastroenterology

## 2019-06-28 ENCOUNTER — Other Ambulatory Visit: Payer: Self-pay

## 2019-06-28 ENCOUNTER — Telehealth: Payer: Self-pay | Admitting: Emergency Medicine

## 2019-06-28 VITALS — BP 158/98 | HR 88 | Temp 97.3°F | Ht 66.0 in | Wt 172.4 lb

## 2019-06-28 DIAGNOSIS — Z1212 Encounter for screening for malignant neoplasm of rectum: Secondary | ICD-10-CM

## 2019-06-28 DIAGNOSIS — K5909 Other constipation: Secondary | ICD-10-CM | POA: Diagnosis not present

## 2019-06-28 DIAGNOSIS — K449 Diaphragmatic hernia without obstruction or gangrene: Secondary | ICD-10-CM

## 2019-06-28 DIAGNOSIS — Z1211 Encounter for screening for malignant neoplasm of colon: Secondary | ICD-10-CM

## 2019-06-28 NOTE — Telephone Encounter (Signed)
Patient takes Miralax for constipation, wants to know if she should also take Linzess with it?

## 2019-06-28 NOTE — Progress Notes (Signed)
Wyline Mood MD, MRCP(U.K) [address]  [address]  [place], [place] [address]  Main: [phone]  Fax: [phone]   Gastroenterology Consultation  Referring Provider:     Tarri Fuller, FNP Primary Care Physician:  Tarri Fuller, FNP Primary Gastroenterologist:  Dr. Wyline Mood  Reason for Consultation: Paraesophageal hernia        HPI:   Katrina Sutton is a 69 y.o. y/o female referred for consultation & management  by  Tarri Fuller, FNP.    She was admitted in February 2021 with hypoxia and atelectasis of the right side felt to be due to a large paraesophageal hernia.  CT angiogram of the chest PE protocol in February 21 showed a large paraesophageal hernia with compressive atelectasis at the right lung base.This has been previously noted on a CT scan of the abdomen in December 2019 as well.  She follows with Dr. Lady Gary in general surgery she has a history of gallstone pancreatitis.  He states that she has known that she has had a hernia for over 25-30 years.  Never had an endoscopy.  Never had a discussion about surgery.  She is on Prilosec 40 mg in the morning and 20 mg at night.  No symptoms of reflux.  She states that her breathing has gradually got worse over the years.  She also complains of constipation unable to have a bowel movement unless she takes a bit of MiraLAX the previous night despite taking MiraLAX her stools are very hard in texture.  Is due for a screening colonoscopy never had polyps.  Past Medical History:  Diagnosis Date  . Allergy    seasonal  . Anxiety   . Arthritis    osteoarthritis  . Depression   . GERD (gastroesophageal reflux disease)   . Hiatal hernia     Past Surgical History:  Procedure Laterality Date  . CHOLECYSTECTOMY N/A 05/31/2018   Procedure: LAPAROSCOPIC CHOLECYSTECTOMY;  Surgeon: Duanne Guess, MD;  Location: [facility] ORS;  Service: General;  Laterality: N/A;  . TONSILLECTOMY    . UMBILICAL HERNIA REPAIR N/A 05/31/2018   Procedure: HERNIA REPAIR UMBILICAL ADULT;  Surgeon: Duanne Guess, MD;  Location: [facility] ORS;  Service: General;  Laterality: N/A;  . WRIST SURGERY Left     Prior to Admission medications   Medication Sig Start Date End Date Taking? Authorizing Provider  acetaminophen (TYLENOL) 500 MG tablet Take 500 mg by mouth every 8 (eight) hours as needed for headache.    [provider]  clonazePAM (KLONOPIN) 1 MG tablet TAKE 1 TABLET BY MOUTH TWICE A DAY AS NEEDED FOR ANXIETY 06/13/19   Malfi, Jodelle Gross, FNP  clotrimazole-betamethasone (LOTRISONE) cream  09/21/18   [provider]  hydrocortisone cream 1 % Apply 1 application topically 2 (two) times daily as needed for itching.    [provider]  nystatin (MYCOSTATIN/NYSTOP) powder Apply topically 3 (three) times daily. For about 2 weeks, may use up to 4 weeks if need Patient not taking: Reported on 06/19/2019 02/15/19   Smitty Cords, DO  omeprazole (PRILOSEC) 20 MG capsule Take 2 capsules in the morning and 1 capsule in the evening Patient taking differently: 40 mg daily.  05/22/19   Tarri Fuller, FNP  PARoxetine (PAXIL) 40 MG tablet TAKE 1 TABLET BY MOUTH DAILY 04/13/19   Karamalegos, Netta Neat, DO  polyethylene glycol (MIRALAX / GLYCOLAX) packet Take 17 g by mouth daily as needed.     [provider]  senna (SENOKOT) 8.6 MG TABS tablet Take 1 tablet by mouth daily as needed for mild constipation.    [provider]  sodium chloride (OCEAN) 0.65 % SOLN nasal spray Place 1 spray into both nostrils as needed for congestion.    [provider]    Family History  Problem Relation Age of Onset  . Mental illness Mother   . Ulcers Father   . Stroke Neg Hx   . Heart attack Neg Hx   . Cancer Neg Hx      Social History   Tobacco Use  . Smoking status: Former Smoker    Quit date: 12/27/1976    Years since quitting: 42.5  . Smokeless tobacco: Never Used  Substance Use Topics  . Alcohol  use: No  . Drug use: No    Allergies as of 06/28/2019 - Review Complete 06/19/2019  Allergen Reaction Noted  . Erythromycin Shortness Of Breath and Rash 03/11/2018  . Duloxetine Palpitations 04/26/2017    Review of Systems:    All systems reviewed and negative except where noted in HPI.   Physical Exam:  There were no vitals taken for this visit. No LMP recorded. Patient is postmenopausal. Psych:  Alert and cooperative. Normal mood and affect. General:   Alert,  Well-developed, well-nourished, pleasant and cooperative in NAD Head:  Normocephalic and atraumatic. Eyes:  Sclera clear, no icterus.   Conjunctiva pink. Ears:  Normal auditory acuity. Lungs:  Respirations even and unlabored.  Clear throughout to auscultation.   No wheezes, crackles, or rhonchi. No acute distress. Heart:  Regular rate and rhythm; no murmurs, clicks, rubs, or gallops. Abdomen:  Normal bowel sounds.  No bruits.  Soft, non-tender and non-distended without masses, hepatosplenomegaly or hernias noted.  No guarding or rebound tenderness.    Neurologic:  Alert and oriented x3;  grossly normal neurologically. Psych:  Alert and cooperative. Normal mood and affect.  Imaging Studies: No results found.  Assessment and Plan:   Katrina Sutton is a 69 y.o. y/o female has been referred for a large paraesophageal hernia seen on CT scan during recent admission for hypoxia in February 2021.  The hernia seems to compress the lung and is causing her to have symptoms.  She is also due for a screening colonoscopy.  Has some issues with constipation has not tried any other agents apart from [facility] which has failed.  Plan 1.  Obtain barium esophagram to delineate the anatomy of the hernia 2.  EGD 3.  Following which refer to Dr. Celine Ahr for evaluation for surgical repair of the large paraesophageal hernia.  4.  PPI may need to be continued long-term until the hernia is repaired. 5.  Screening colonoscopy 6.  Linzess 145 mcg  samples to be provided if does not work she has been advised to contact her office to pick up 290 mcg samples  I have discussed alternative options, risks & benefits,  which include, but are not limited to, bleeding, infection, perforation,respiratory complication & drug reaction.  The patient agrees with this plan & written consent will be obtained.    Follow up as needed  Dr Jonathon Bellows MD,MRCP(U.K)

## 2019-06-28 NOTE — Telephone Encounter (Signed)
Per Dr Lady Gary, pt has a Paraesophageal hernia. Pt needs to be scheduled to see Dr Everlene Farrier.  Attempted to call pt with no answer. Left vm to call back to the office.

## 2019-06-29 ENCOUNTER — Telehealth: Payer: Self-pay

## 2019-06-29 ENCOUNTER — Other Ambulatory Visit: Payer: Self-pay | Admitting: Family Medicine

## 2019-06-29 DIAGNOSIS — K219 Gastro-esophageal reflux disease without esophagitis: Secondary | ICD-10-CM

## 2019-06-29 MED ORDER — OMEPRAZOLE 20 MG PO CPDR: 20.0000 mg | DELAYED_RELEASE_CAPSULE | Freq: Every day | ORAL | 1 refills | Status: DC

## 2019-06-29 MED ORDER — OMEPRAZOLE 40 MG PO CPDR: 40.0000 mg | DELAYED_RELEASE_CAPSULE | Freq: Every day | ORAL | 1 refills | Status: DC

## 2019-06-29 NOTE — Progress Notes (Signed)
Insurance company is requiring PA for 3 caps per day of medication.  Changing dosage to 40mg  cap in morning instead of 20mg  x 2 in morning.  Continuing 20mg  in the evenings.  Rx sent to pharmacy

## 2019-06-29 NOTE — Telephone Encounter (Signed)
Would only take linzess and hold miralax- if does not work after a week can add miralax

## 2019-06-29 NOTE — Telephone Encounter (Signed)
Spoke with pt and informed her of Dr. Anna's recommendations. Pt agrees. 

## 2019-07-02 ENCOUNTER — Ambulatory Visit: Payer: Medicare HMO | Admitting: Surgery

## 2019-07-02 NOTE — Telephone Encounter (Signed)
Open in error

## 2019-07-04 ENCOUNTER — Ambulatory Visit: Payer: Medicare HMO | Admitting: Surgery

## 2019-07-04 ENCOUNTER — Telehealth: Payer: Self-pay

## 2019-07-04 ENCOUNTER — Encounter: Payer: Self-pay | Admitting: Surgery

## 2019-07-04 ENCOUNTER — Other Ambulatory Visit: Payer: Self-pay

## 2019-07-04 ENCOUNTER — Telehealth: Payer: Self-pay | Admitting: Gastroenterology

## 2019-07-04 VITALS — BP 153/95 | HR 92 | Temp 97.9°F | Resp 15 | Ht 66.0 in | Wt 173.0 lb

## 2019-07-04 DIAGNOSIS — K449 Diaphragmatic hernia without obstruction or gangrene: Secondary | ICD-10-CM | POA: Diagnosis not present

## 2019-07-04 DIAGNOSIS — R0609 Other forms of dyspnea: Secondary | ICD-10-CM

## 2019-07-04 DIAGNOSIS — R0600 Dyspnea, unspecified: Secondary | ICD-10-CM | POA: Diagnosis not present

## 2019-07-04 NOTE — Patient Instructions (Addendum)
Follow up with Dr Tobi Bastos as scheduled for your barium swallow on 07/06/19 and scheduled EGD and Colonoscopy on 07/19/19.  We will get you scheduled for PFT breathing studies. We will call you about this.   We will also refer you to Cardiology for an evaluation and clearance for surgery.  You are scheduled to see Cardiology at [facility] on Monday 07/09/19 at 8:00 am. Enter in through the [facility].   Follow up here in about a month.

## 2019-07-04 NOTE — Telephone Encounter (Signed)
The patient is scheduled for a Cardiology evaluation at [facility] 07/09/19 at 8:20 am. She will arrive by 8:00 am and enter in through the [facility] entrance.  She is aware of date and time.

## 2019-07-04 NOTE — Telephone Encounter (Signed)
[facility] called & would like Lizess 145mg  called into the pharmacy. DR had given patient samples. Medication is working for patient.

## 2019-07-04 NOTE — Telephone Encounter (Addendum)
Patient notified that she is scheduled for Pulmonary Function testing at [facility] on 07/20/19 at 2:15 pm. She will need to arrive there by 2:00 pm and go in through the [facility] entrance. She is to have no caffeine that day prior to testing.  She will have her Covid-19 test done on 07/17/19 at the [facility] drive up testing any time between 8am-1pm due to having an endoscopy done on 07/19/19.  The patient is aware of dates, times, and instructions.

## 2019-07-04 NOTE — Progress Notes (Signed)
Surgical Consultation  07/04/2019  Katrina Sutton is an 69 y.o. female.   Chief Complaint  Patient presents with  . Other    Hiatal Hernia     HPI: 69 year old female seen in consultation at the request of Dr. Vicente Males.  She does have giant paraesophageal hernia.  She reports that she is having intermittent shortness of breath on exertion.  Some occasional cough.  She does report some reflux disease.  She has had a prior episode of food impaction several years ago requiring an upper endoscopy.  She has had significant work-up including a CT scan of the abdomen and pelvis as well as a CT of the chest that I have personally reviewed showing evidence of giant paraesophageal hernia with the majority of the stomach on the right hemithorax.  Please note that there is some colon within the hernia sac and this involves about two thirds of her right chest cavity.  She did have a history of cholecystectomy and did well.  She now develops ventral hernia.  I also visualized the ventral hernia on the CT scan.  He has been evaluated by Dr. Vicente Males and she has a pending EGD as well as barium swallow.  Past Medical History:  Diagnosis Date  . Allergy    seasonal  . Anxiety   . Arthritis    osteoarthritis  . Depression   . GERD (gastroesophageal reflux disease)   . Hiatal hernia     Past Surgical History:  Procedure Laterality Date  . CHOLECYSTECTOMY N/A 05/31/2018   Procedure: LAPAROSCOPIC CHOLECYSTECTOMY;  Surgeon: Fredirick Maudlin, MD;  Location: [facility] ORS;  Service: General;  Laterality: N/A;  . TONSILLECTOMY    . UMBILICAL HERNIA REPAIR N/A 05/31/2018   Procedure: HERNIA REPAIR UMBILICAL ADULT;  Surgeon: Fredirick Maudlin, MD;  Location: [facility] ORS;  Service: General;  Laterality: N/A;  . WRIST SURGERY Left     Family History  Problem Relation Age of Onset  . Mental illness Mother   . Ulcers Father   . Stroke Neg Hx   . Heart attack Neg Hx   . Cancer Neg Hx     Social History:  reports that she  quit smoking about 42 years ago. She has never used smokeless tobacco. She reports that she does not drink alcohol or use drugs.  Allergies:  Allergies  Allergen Reactions  . Erythromycin Shortness Of Breath and Rash  . Duloxetine Palpitations    Medications reviewed.     ROS Full ROS performed and is otherwise negative other than what is stated in the HPI    BP (!) 153/95   Pulse 92   Temp 97.9 F (36.6 C)   Resp 15   Ht 5\' 6"  (1.676 m)   Wt 173 lb (78.5 kg)   SpO2 93%   BMI 27.92 kg/m   Physical Exam Vitals and nursing note reviewed. Exam conducted with a chaperone present.  Constitutional:      General: She is not in acute distress.    Appearance: Normal appearance. She is normal weight.  Eyes:     General: No scleral icterus.       Right eye: No discharge.        Left eye: No discharge.  Neck:     Vascular: No carotid bruit.  Cardiovascular:     Rate and Rhythm: Normal rate and regular rhythm.     Heart sounds: No murmur.  Pulmonary:     Effort: No respiratory distress.  Breath sounds: No stridor. No rhonchi.     Comments: Decrease BS on the Right side 2/3 Caudal region Abdominal:     General: Abdomen is flat. There is no distension.     Palpations: Abdomen is soft. There is no mass.     Tenderness: There is no abdominal tenderness.     Hernia: A hernia is present.     Comments: Reducible periumbilical ventral hernia  Musculoskeletal:        General: No swelling. Normal range of motion.     Cervical back: Normal range of motion and neck supple. No rigidity or tenderness.  Skin:    General: Skin is warm and dry.     Capillary Refill: Capillary refill takes less than 2 seconds.  Neurological:     General: No focal deficit present.     Mental Status: She is alert and oriented to person, place, and time.  Psychiatric:        Mood and Affect: Mood normal.        Behavior: Behavior normal.        Thought Content: Thought content normal.         Judgment: Judgment normal.      Assessment/Plan: Giant paraesophageal hernia causing significant respiratory symptoms from compression into the right lung.  I do think that at some point time this will need to be fixed.  We will have to first establish and delineate the anatomy and will await for the results of the EGD as well as the barium swallow.  I actually also like to have a cardiology evaluation given her dyspnea on exertion and I will order PFTs as well. Only there is no evidence of strangulation of the hernia although it is causing significant symptoms.  She does have a ventral hernia that is currently reducible and I do think that this can be addressed at the same time during the paraesophageal hernia repair. Extensive counseling provided.  Please note that I have showed her all the pictures and have a spent at least 50 minutes in this encounter with greater than 50% spent in coordination and counseling of her care   [name], MD FACS General Katrina Sutton

## 2019-07-05 ENCOUNTER — Telehealth: Payer: Self-pay | Admitting: Gastroenterology

## 2019-07-05 ENCOUNTER — Ambulatory Visit (INDEPENDENT_AMBULATORY_CARE_PROVIDER_SITE_OTHER): Payer: Medicare HMO | Admitting: General Practice

## 2019-07-05 ENCOUNTER — Other Ambulatory Visit: Payer: Self-pay

## 2019-07-05 ENCOUNTER — Telehealth: Payer: Self-pay | Admitting: General Practice

## 2019-07-05 DIAGNOSIS — F418 Other specified anxiety disorders: Secondary | ICD-10-CM | POA: Diagnosis not present

## 2019-07-05 DIAGNOSIS — F319 Bipolar disorder, unspecified: Secondary | ICD-10-CM

## 2019-07-05 DIAGNOSIS — K449 Diaphragmatic hernia without obstruction or gangrene: Secondary | ICD-10-CM

## 2019-07-05 DIAGNOSIS — K219 Gastro-esophageal reflux disease without esophagitis: Secondary | ICD-10-CM

## 2019-07-05 DIAGNOSIS — F419 Anxiety disorder, unspecified: Secondary | ICD-10-CM

## 2019-07-05 MED ORDER — LINACLOTIDE 145 MCG PO CAPS: 145.0000 ug | ORAL_CAPSULE | Freq: Every day | ORAL | 1 refills | Status: DC

## 2019-07-05 NOTE — Telephone Encounter (Signed)
Please call Caryl-lyn with [facility] @ [phone]. She sent me this message through Messenger (hey need to speak with whoever sets up and instructs the patients about their endoscopies regarding this patient .

## 2019-07-05 NOTE — Chronic Care Management (AMB) (Signed)
Chronic Care Management   Follow Up Note   07/05/2019 Name: Katrina Sutton MRN: [id number] DOB: [birth year]  Referred by: Tarri Fuller, FNP Reason for referral : Chronic Care Management (Follow up on: [facility], anxiety, bipolar and depression)   Katrina Sutton is a 69 y.o. year old female who is a primary care patient of Tarri Fuller, FNP. The CCM team was consulted for assistance with chronic disease management and care coordination needs.    Review of patient status, including review of consultants reports, relevant laboratory and other test results, and collaboration with appropriate care team members and the patient's provider was performed as part of comprehensive patient evaluation and provision of chronic care management services.    SDOH (Social Determinants of Health) assessments performed: Yes See Care Plan activities for detailed interventions related to SDOH)  SDOH Interventions     Most Recent Value  SDOH Interventions  SDOH Interventions for the Following Domains  Social Connections, Physical Activity  Physical Activity Interventions  Other (Comments) [the patient is active and walks and is active]  Stress Interventions  Other (Comment) [the patient has some situational anxiety about processes she is dealing with right now but knows she has a good support system]  Social Connections Interventions  Other (Comment) [has a good social network- is going to talk to her friend about transportation from procedure tomorrow]       Outpatient Encounter Medications as of 07/05/2019  Medication Sig Note  . acetaminophen (TYLENOL) 500 MG tablet Take 500 mg by mouth every 8 (eight) hours as needed for headache.   . clonazePAM (KLONOPIN) 1 MG tablet TAKE 1 TABLET BY MOUTH TWICE A DAY AS NEEDED FOR ANXIETY   . clotrimazole-betamethasone (LOTRISONE) cream  06/04/2019: As needed for redness  . hydrocortisone cream 1 % Apply 1 application topically 2 (two) times daily as needed for itching.  06/19/2019: Very seldom   . linaclotide (LINZESS) 145 MCG CAPS capsule Take 145 mcg by mouth daily before breakfast.   . omeprazole (PRILOSEC) 20 MG capsule Take 1 capsule (20 mg total) by mouth daily. In evening   . omeprazole (PRILOSEC) 40 MG capsule Take 1 capsule (40 mg total) by mouth daily. In morning   . PARoxetine (PAXIL) 40 MG tablet TAKE 1 TABLET BY MOUTH DAILY   . polyethylene glycol (MIRALAX / GLYCOLAX) packet Take 17 g by mouth daily as needed.    . senna (SENOKOT) 8.6 MG TABS tablet Take 1 tablet by mouth daily as needed for mild constipation.   . sodium chloride (OCEAN) 0.65 % SOLN nasal spray Place 1 spray into both nostrils as needed for congestion.    No facility-administered encounter medications on file as of 07/05/2019.     Objective:  BP Readings from Last 3 Encounters:  07/04/19 (!) 153/95  06/28/19 (!) 158/98  05/22/19 136/85     Goals Addressed            This Visit's Progress   . RNCM: PT-"I am doing the best I can managing my care"       CARE PLAN ENTRY (see longtitudinal plan of care for additional care plan information)  Current Barriers:  . Chronic Disease Management support, education, and care coordination needs related to Anxiety, Depression, Bipolar Disorder, and Paraesophageal hernia   Clinical Goal(s) related to Anxiety, Depression, Bipolar Disorder, and Paraesophageal hernia :  Over the next 120 days, patient will:  . Work with the care management team to address educational, disease  management, and care coordination needs  . Begin or continue self health monitoring activities as directed today  follow a soft diet, sit up straight to prevent choking hazard, monitor for s/s of dehydration . Call provider office for new or worsened signs and symptoms Oxygen saturation lower than established parameter, Shortness of breath, and New or worsened symptom related to Anxiety/depression/bipolar disorder or complications from Paraesophageal hernia as  evidence by recent hospitalization due to hypoxia  . Call care management team with questions or concerns . Verbalize basic understanding of patient centered plan of care established today  Interventions related to Anxiety, Depression, Bipolar Disorder, and Paraesophageal hernia :  . Evaluation of current treatment plans and patient's adherence to plan as established by provider- the patient has a follow up for consultation for evaluation of paraesophageal hernia on 06-28-2019.  The patient is supposed to have a barium swallow procedure tomorrow (07-06-2019). She does not know if she will keep the appointment. The patient says that she does not know if her husband will come and get her. The patient is stressed over this.  Empathetic listening. Offered an urgent care guide referral for transportation for the patient but the patient declined.  The patient has decided to call her friend who helps her to see if she could be back up for transportation if her husband is not willing to help her.  Expressed to the patient the need to follow through with procedures and testing so the medical team can determine the best treatment plan for managing her large hernia that is growing.  . Assessed patient understanding of disease states- the patient states the doctor at the hospital told her the hernia has gotten so large that medication will no longer help and surgical intervention needs to be considered. The patient is having a hard time with becoming short of breath at night when trying to sleep. She is propping herself up and doing the best she can. She did not qualify for home oxygen but feels she needs it, especially at night. This is an ongoing concern of the patient.  . Assessed patient's education and care coordination needs . Provided disease specific education to patient- education on eating foods that are soft and easy to swallow, education on sitting straight up when eating to prevent choking, education on  staying hydrated and monitor for s/s of dehydration. The patient is drinking "plenty of water", also she is drinking gatorade and Ensure at times. The patient states she is being careful when eating. The patient is managing her symptoms well during the day with positive changes in her diet.  Nash Dimmer with appropriate clinical care team members regarding patient needs: Ongoing support from LCSW for stress, anxiety, and depression. Pharmacy consult for recommendations and help with clarification of omeprazole dosage and recommendations . Collaboration with pcp regarding patients stated concerns with omeprazole dosage, breathing concerns, especially at night. Recommended the patient write down questions for pcp and take to post discharge appointment with Cyndia Skeeters, NP on 3/9.  The patient verbalized understanding. Completed the appointment.  . Education after the patient called and left a VM stating she did not feel she needed to come to see the pcp on 3/9.  The patient states she has not been eating fried foods and eating smaller portions and she feels so much better and she did not see why she should come in to see Elmyra Ricks on 3/9.  Explained to the patient the need to keep the appointment due to being  in the hospital and this would be a post hospital follow up and it would be good for pcp to evaluate her post discharge. The patient verbalized understanding and states she would still come to the appointment on 3/9.  No other concerns at this time. Completed.  . Evaluation of patients concerns about upcoming testing and procedures scheduled. The patient feels that since she is managing her symptoms well and not having any flare ups right now that she is fine and does not need to move forward with surgical intervention. Encouraged the patient to talk to her medical team and told her that right now she may be feeling okay but what if she has a flare up and has to be hospitalized again. The patient is  concerned over her husband not being supportive in her current health concerns. The patient is considering abandoning further testing at this time.  Encouraged the patient to really think about what she needs to do to maintain her health and well being and how long it took her to get referrals. The patient verbalized she will do what she needs to do. Thanked the [facility] for talking to her and listening to her. Will let pcp know RNCM and patient concerns.   Patient Self Care Activities related to Anxiety, Depression, Bipolar Disorder, and Paraesophageal hernia :  . Patient is unable to independently self-manage chronic health conditions  Please see past updates related to this goal by clicking on the "Past Updates" button in the selected goal          Plan:   The care management team will reach out to the patient again over the next 30 to 60 days.    Alto Denver RN, MSN, CCM Community Care Coordinator [place]  [facility] Mobile: [phone]

## 2019-07-05 NOTE — Patient Instructions (Signed)
Visit Information  Goals Addressed            This Visit's Progress    RNCM: PT-"I am doing the best I can managing my care"       CARE PLAN ENTRY (see longtitudinal plan of care for additional care plan information)  Current Barriers:   Chronic Disease Management support, education, and care coordination needs related to Anxiety, Depression, Bipolar Disorder, and Paraesophageal hernia   Clinical Goal(s) related to Anxiety, Depression, Bipolar Disorder, and Paraesophageal hernia :  Over the next 120 days, patient will:   Work with the care management team to address educational, disease management, and care coordination needs   Begin or continue self health monitoring activities as directed today  follow a soft diet, sit up straight to prevent choking hazard, monitor for s/s of dehydration  Call provider office for new or worsened signs and symptoms Oxygen saturation lower than established parameter, Shortness of breath, and New or worsened symptom related to Anxiety/depression/bipolar disorder or complications from Paraesophageal hernia as evidence by recent hospitalization due to hypoxia   Call care management team with questions or concerns  Verbalize basic understanding of patient centered plan of care established today  Interventions related to Anxiety, Depression, Bipolar Disorder, and Paraesophageal hernia :   Evaluation of current treatment plans and patient's adherence to plan as established by provider- the patient has a follow up for consultation for evaluation of paraesophageal hernia on 06-28-2019.  The patient is supposed to have a barium swallow procedure tomorrow (07-06-2019). She does not know if she will keep the appointment. The patient says that she does not know if her husband will come and get her. The patient is stressed over this.  Empathetic listening. Offered an urgent care guide referral for transportation for the patient but the patient declined.  The patient  has decided to call her friend who helps her to see if she could be back up for transportation if her husband is not willing to help her.  Expressed to the patient the need to follow through with procedures and testing so the medical team can determine the best treatment plan for managing her large hernia that is growing.   Assessed patient understanding of disease states- the patient states the doctor at the hospital told her the hernia has gotten so large that medication will no longer help and surgical intervention needs to be considered. The patient is having a hard time with becoming short of breath at night when trying to sleep. She is propping herself up and doing the best she can. She did not qualify for home oxygen but feels she needs it, especially at night. This is an ongoing concern of the patient.   Assessed patient's education and care coordination needs  Provided disease specific education to patient- education on eating foods that are soft and easy to swallow, education on sitting straight up when eating to prevent choking, education on staying hydrated and monitor for s/s of dehydration. The patient is drinking "plenty of water", also she is drinking gatorade and Ensure at times. The patient states she is being careful when eating. The patient is managing her symptoms well during the day with positive changes in her diet.   Collaborated with appropriate clinical care team members regarding patient needs: Ongoing support from LCSW for stress, anxiety, and depression. Pharmacy consult for recommendations and help with clarification of omeprazole dosage and recommendations  Collaboration with pcp regarding patients stated concerns with omeprazole dosage, breathing  concerns, especially at night. Recommended the patient write down questions for pcp and take to post discharge appointment with Cyndia Skeeters, NP on 3/9.  The patient verbalized understanding. Completed the appointment.   Education  after the patient called and left a VM stating she did not feel she needed to come to see the pcp on 3/9.  The patient states she has not been eating fried foods and eating smaller portions and she feels so much better and she did not see why she should come in to see Elmyra Ricks on 3/9.  Explained to the patient the need to keep the appointment due to being in the hospital and this would be a post hospital follow up and it would be good for pcp to evaluate her post discharge. The patient verbalized understanding and states she would still come to the appointment on 3/9.  No other concerns at this time. Completed.   Evaluation of patients concerns about upcoming testing and procedures scheduled. The patient feels that since she is managing her symptoms well and not having any flare ups right now that she is fine and does not need to move forward with surgical intervention. Encouraged the patient to talk to her medical team and told her that right now she may be feeling okay but what if she has a flare up and has to be hospitalized again. The patient is concerned over her husband not being supportive in her current health concerns. The patient is considering abandoning further testing at this time.  Encouraged the patient to really think about what she needs to do to maintain her health and well being and how long it took her to get referrals. The patient verbalized she will do what she needs to do. Thanked the [facility] for talking to her and listening to her. Will let pcp know RNCM and patient concerns.   Patient Self Care Activities related to Anxiety, Depression, Bipolar Disorder, and Paraesophageal hernia :   Patient is unable to independently self-manage chronic health conditions  Please see past updates related to this goal by clicking on the "Past Updates" button in the selected goal         Patient verbalizes understanding of instructions provided today.   The care management team will reach out to the  patient again over the next 30 to 60 days.   Katrina Larsson RN, MSN, [place] Mobile: [phone]

## 2019-07-05 NOTE — Telephone Encounter (Signed)
Spoke with Caryl-lyn regarding pt's procedure instructions. Will call pt to remind her of prep instructions and inquire if she still has the printed instructions.  Spoke with pt, she states she does have the prep instructions and understands what she needs to do.

## 2019-07-06 ENCOUNTER — Ambulatory Visit: Payer: Medicare HMO

## 2019-07-09 ENCOUNTER — Ambulatory Visit: Payer: Medicare HMO | Admitting: Cardiology

## 2019-07-09 NOTE — Progress Notes (Signed)
Request for Cardiac Clearance has been faxed to Dr Merita Norton office at [facility].

## 2019-07-10 ENCOUNTER — Telehealth: Payer: Self-pay

## 2019-07-10 ENCOUNTER — Encounter: Payer: Self-pay | Admitting: Cardiology

## 2019-07-10 NOTE — Telephone Encounter (Signed)
Call to patient as we have had to reschedule her Pulmonary Function study. She is now scheduled for a Covid test for this on Tuesday May 11th at the [facility] drive up testing between 8-1 pm. She will have her Pulmonary Function Test on Wednesday May 12th at [facility], she will arrive there by 2:00 pm and enter in through the [facility] entrance. She is aware to refrain from all caffeine that day until after her test.

## 2019-07-11 ENCOUNTER — Other Ambulatory Visit: Payer: Self-pay | Admitting: Family Medicine

## 2019-07-11 ENCOUNTER — Telehealth: Payer: Self-pay | Admitting: Cardiology

## 2019-07-11 DIAGNOSIS — F419 Anxiety disorder, unspecified: Secondary | ICD-10-CM

## 2019-07-11 NOTE — Telephone Encounter (Signed)
[place]ally [place], 07/11/19.  According to PMP aware, pt last received a refill on 06/13/2019.  Refill of her controlled substance will be provided today.

## 2019-07-11 NOTE — Telephone Encounter (Signed)
° °  [facility] Pre-operative Risk Assessment    Request for surgical clearance:  1. What type of surgery is being performed? Nissen fundoplication   2. When is this surgery scheduled? TBD  3. What type of clearance is required (medical clearance vs. Pharmacy clearance to hold med vs. Both)? both  4. Are there any medications that need to be held prior to surgery and how long? Not listed, please advise if needed.  5. Practice name and name of physician performing surgery? [facility]  6. What is your office phone number [phone]   7.   What is your office fax number [phone]  8.   Anesthesia type (None, local, MAC, general) ? general   Ace Gins 07/11/2019, 3:25 PM  _________________________________________________________________ Patient no showed for consultation on 07/09/19 - LMOV to reschedule

## 2019-07-11 NOTE — Telephone Encounter (Signed)
Requested medication (s) are due for refill today: yes  Requested medication (s) are on the active medication list: yes  Last refill:  06/13/19  Future visit scheduled: no  Notes to clinic:  not delegated    Requested Prescriptions  Pending Prescriptions Disp Refills   clonazePAM (KLONOPIN) 1 MG tablet [Pharmacy Med Name: CLONAZEPAM 1 MG TAB] 60 tablet     Sig: TAKE 1 TABLET BY MOUTH TWICE A DAY AS NEEDED FOR ANXIETY      Not Delegated - Psychiatry:  Anxiolytics/Hypnotics Failed - 07/11/2019 12:49 PM      Failed - This refill cannot be delegated      Failed - Urine Drug Screen completed in last 360 days.      Passed - Valid encounter within last 6 months    Recent Outpatient Visits           1 month ago Paraesophageal hernia   [facility], Jodelle Gross, FNP   2 months ago Leg swelling   [facility], Jodelle Gross, FNP   2 months ago Gastroesophageal reflux disease, unspecified whether esophagitis present   [facility], Jodelle Gross, FNP   8 months ago Gastroesophageal reflux disease with esophagitis   [facility] Galen Manila, NP   9 months ago Candidal intertrigo   [facility] Althea Charon, Netta Neat, DO       Future Appointments             In 11 months [facility]

## 2019-07-11 NOTE — Telephone Encounter (Signed)
   Primary Cardiologist:No primary care provider on file.  Chart reviewed as part of pre-operative protocol coverage. Because of Katrina Sutton's past medical history and time since last visit, he/she will require a follow-up visit in order to better assess preoperative cardiovascular risk.  Patient has never been seen by our practice so will require new patient appointment. No-show listed for 4/26 appointment with Dr. Azucena Cecil.  Pre-op covering staff: - Please schedule appointment and call patient to inform them. - Please contact requesting surgeon's office via preferred method (i.e, phone, fax) to inform them of need for appointment prior to surgery.  Laurann Montana, PA-C  07/11/2019, 5:14 PM

## 2019-07-13 NOTE — Telephone Encounter (Signed)
Message has been sent to scheduling to reach to the pt with a New Pt appt for pre op.

## 2019-07-13 NOTE — Telephone Encounter (Signed)
Pt has been scheduled to see Dr. Arlys John Agbor-Etang 07/20/19 for pre op clearance. I will forward clearance notes to MD for upcoming appt. I will send as FYI to the surgeon pt has New Pt appt with cardiologist. I will remove from the pre op call back pool.

## 2019-07-17 ENCOUNTER — Ambulatory Visit (INDEPENDENT_AMBULATORY_CARE_PROVIDER_SITE_OTHER): Payer: Medicare HMO | Admitting: General Practice

## 2019-07-17 ENCOUNTER — Other Ambulatory Visit: Admission: RE | Admit: 2019-07-17 | Payer: Medicare HMO | Source: Ambulatory Visit

## 2019-07-17 ENCOUNTER — Telehealth: Payer: Self-pay | Admitting: Surgery

## 2019-07-17 ENCOUNTER — Telehealth: Payer: Self-pay | Admitting: *Deleted

## 2019-07-17 ENCOUNTER — Telehealth: Payer: Self-pay

## 2019-07-17 DIAGNOSIS — K449 Diaphragmatic hernia without obstruction or gangrene: Secondary | ICD-10-CM

## 2019-07-17 DIAGNOSIS — F418 Other specified anxiety disorders: Secondary | ICD-10-CM

## 2019-07-17 DIAGNOSIS — F419 Anxiety disorder, unspecified: Secondary | ICD-10-CM

## 2019-07-17 DIAGNOSIS — F319 Bipolar disorder, unspecified: Secondary | ICD-10-CM

## 2019-07-17 NOTE — Telephone Encounter (Signed)
Patient called asking if you would give her a call when you had a moment. Please call patient and advise.

## 2019-07-17 NOTE — Telephone Encounter (Signed)
Patient reports some bulging with her ventral hernia and some discomfort. Reports no nausea or vomiting and no fever or chills.  Patient instructed to try lying down and applying ice to the area to try to get the hernia to go back in. Patient given information to go to the ER if she develops a fever or starts having vomiting. She will call tomorrow if no improvement.

## 2019-07-17 NOTE — Telephone Encounter (Signed)
Patient called in regards to her procedure that is scheduled for 07/19/19 and wants a return phone call

## 2019-07-17 NOTE — Chronic Care Management (AMB) (Signed)
Chronic Care Management   Follow Up Note   07/17/2019 Name: Katrina Sutton MRN: [id number] DOB: 1950/10/30  Referred by: Tarri Fuller, FNP Reason for referral : Chronic Care Management (Incoming call: Anxiety and Paraesophageal Hernia )   Katrina Sutton is a 69 y.o. year old female who is a primary care patient of Anitra Lauth, Jodelle Gross, FNP. The CCM team was consulted for assistance with chronic disease management and care coordination needs.    Review of patient status, including review of consultants reports, relevant laboratory and other test results, and collaboration with appropriate care team members and the patient's provider was performed as part of comprehensive patient evaluation and provision of chronic care management services.    SDOH (Social Determinants of Health) assessments performed: No See Care Plan activities for detailed interventions related to [facility])     Outpatient Encounter Medications as of 07/17/2019  Medication Sig Note   acetaminophen (TYLENOL) 500 MG tablet Take 500 mg by mouth every 8 (eight) hours as needed for headache.    clonazePAM (KLONOPIN) 1 MG tablet TAKE 1 TABLET BY MOUTH TWICE A DAY AS NEEDED FOR ANXIETY    clotrimazole-betamethasone (LOTRISONE) cream  06/04/2019: As needed for redness   hydrocortisone cream 1 % Apply 1 application topically 2 (two) times daily as needed for itching. 06/19/2019: Very seldom    linaclotide (LINZESS) 145 MCG CAPS capsule Take 145 mcg by mouth daily before breakfast.    linaclotide (LINZESS) 145 MCG CAPS capsule Take 1 capsule (145 mcg total) by mouth daily before breakfast.    omeprazole (PRILOSEC) 20 MG capsule Take 1 capsule (20 mg total) by mouth daily. In evening    omeprazole (PRILOSEC) 40 MG capsule Take 1 capsule (40 mg total) by mouth daily. In morning    PARoxetine (PAXIL) 40 MG tablet TAKE 1 TABLET BY MOUTH DAILY    polyethylene glycol (MIRALAX / GLYCOLAX) packet Take 17 g by mouth daily as needed.     senna  (SENOKOT) 8.6 MG TABS tablet Take 1 tablet by mouth daily as needed for mild constipation.    sodium chloride (OCEAN) 0.65 % SOLN nasal spray Place 1 spray into both nostrils as needed for congestion.    No facility-administered encounter medications on file as of 07/17/2019.     Objective:   Goals Addressed            This Visit's Progress    RNCM: PT-"I am doing the best I can managing my care" (pt-stated)       CARE PLAN ENTRY (see longtitudinal plan of care for additional care plan information)  Current Barriers:   Chronic Disease Management support, education, and care coordination needs related to Anxiety, Depression, Bipolar Disorder, and Paraesophageal hernia   Clinical Goal(s) related to Anxiety, Depression, Bipolar Disorder, and Paraesophageal hernia :  Over the next 120 days, patient will:   Work with the care management team to address educational, disease management, and care coordination needs   Begin or continue self health monitoring activities as directed today  follow a soft diet, sit up straight to prevent choking hazard, monitor for s/s of dehydration  Call provider office for new or worsened signs and symptoms Oxygen saturation lower than established parameter, Shortness of breath, and New or worsened symptom related to Anxiety/depression/bipolar disorder or complications from Paraesophageal hernia as evidence by recent hospitalization due to hypoxia   Call care management team with questions or concerns  Verbalize basic understanding of patient centered plan of  care established today  Interventions related to Anxiety, Depression, Bipolar Disorder, and Paraesophageal hernia :   Evaluation of current treatment plans and patient's adherence to plan as established by provider- the patient has a follow up for consultation for evaluation of paraesophageal hernia on 06-28-2019.  The patient is supposed to have a barium swallow procedure tomorrow (07-06-2019). She  does not know if she will keep the appointment. The patient says that she does not know if her husband will come and get her. The patient is stressed over this.  Empathetic listening. Offered an urgent care guide referral for transportation for the patient but the patient declined.  The patient has decided to call her friend who helps her to see if she could be back up for transportation if her husband is not willing to help her.  Expressed to the patient the need to follow through with procedures and testing so the medical team can determine the best treatment plan for managing her large hernia that is growing. The patient did not go and have her barium swallow as she felt she was doing better and not it is not needed. The patient called today in distress asking the RNCM if she went to the hospital by 911 would they see that she needs surgery and allow her to have the surgery to repair the growing paraesophageal hernia. Explained to the patient if she was in respiratory distress she should go and be evaluated at the hospital but the [facility] could not predict how they would treat the patient. The patient apologized for lying about going to have her barium swallow.   Assessed patient understanding of disease states- the patient states the doctor at the hospital told her the hernia has gotten so large that medication will no longer help and surgical intervention needs to be considered. The patient is having a hard time with becoming short of breath at night when trying to sleep. She is propping herself up and doing the best she can. She did not qualify for home oxygen but feels she needs it, especially at night. This is an ongoing concern of the patient. The patient is having an acute exacerbation today and feels she needs to be evaluated in the ER.  Secure chat sent to pcp Cyndia Skeeters letting the pcp know of the patient calling with concerns of exacerbation and recommendation of the patient going to be evaluated at  the ER.  Assessed patient's education and care coordination needs  Provided disease specific education to patient- education on eating foods that are soft and easy to swallow, education on sitting straight up when eating to prevent choking, education on staying hydrated and monitor for s/s of dehydration. The patient is drinking "plenty of water", also she is drinking gatorade and Ensure at times. The patient states she is being careful when eating. The patient is managing her symptoms well during the day with positive changes in her diet.   Collaborated with appropriate clinical care team members regarding patient needs: Ongoing support from LCSW for stress, anxiety, and depression. Pharmacy consult for recommendations and help with clarification of omeprazole dosage and recommendations  Collaboration with pcp regarding patients stated concerns with omeprazole dosage, breathing concerns, especially at night. Recommended the patient write down questions for pcp and take to post discharge appointment with Cyndia Skeeters, NP on 3/9.  The patient verbalized understanding. Completed the appointment.   Education after the patient called and left a VM stating she did not feel she needed to  come to see the pcp on 3/9.  The patient states she has not been eating fried foods and eating smaller portions and she feels so much better and she did not see why she should come in to see Joni Reining on 3/9.  Explained to the patient the need to keep the appointment due to being in the hospital and this would be a post hospital follow up and it would be good for pcp to evaluate her post discharge. The patient verbalized understanding and states she would still come to the appointment on 3/9.  No other concerns at this time. Completed.   Evaluation of patients concerns about upcoming testing and procedures scheduled. The patient feels that since she is managing her symptoms well and not having any flare ups right now that she is  fine and does not need to move forward with surgical intervention. Encouraged the patient to talk to her medical team and told her that right now she may be feeling okay but what if she has a flare up and has to be hospitalized again. The patient is concerned over her husband not being supportive in her current health concerns. The patient is considering abandoning further testing at this time.  Encouraged the patient to really think about what she needs to do to maintain her health and well being and how long it took her to get referrals. The patient verbalized she will do what she needs to do. Thanked the [facility] for talking to her and listening to her. Will let pcp know RNCM and patient concerns. The patient did not go to scheduled testing and today is having exacerbation of symptoms related to anxiety and hernia. The patient was advised to be evaluated at the ER for further recommendations and treatment.   Patient Self Care Activities related to Anxiety, Depression, Bipolar Disorder, and Paraesophageal hernia :   Patient is unable to independently self-manage chronic health conditions  Please see past updates related to this goal by clicking on the "Past Updates" button in the selected goal          Plan:   The care management team will reach out to the patient again over the next 30 to 60  days.    Alto Denver RN, MSN, CCM Community Care Coordinator [place]   [facility] [place] Mobile: [phone]

## 2019-07-17 NOTE — Patient Instructions (Signed)
Visit Information  Goals Addressed            This Visit's Progress   . RNCM: PT-"I am doing the best I can managing my care" (pt-stated)       CARE PLAN ENTRY (see longtitudinal plan of care for additional care plan information)  Current Barriers:  . Chronic Disease Management support, education, and care coordination needs related to Anxiety, Depression, Bipolar Disorder, and Paraesophageal hernia   Clinical Goal(s) related to Anxiety, Depression, Bipolar Disorder, and Paraesophageal hernia :  Over the next 120 days, patient will:  . Work with the care management team to address educational, disease management, and care coordination needs  . Begin or continue self health monitoring activities as directed today  follow a soft diet, sit up straight to prevent choking hazard, monitor for s/s of dehydration . Call provider office for new or worsened signs and symptoms Oxygen saturation lower than established parameter, Shortness of breath, and New or worsened symptom related to Anxiety/depression/bipolar disorder or complications from Paraesophageal hernia as evidence by recent hospitalization due to hypoxia  . Call care management team with questions or concerns . Verbalize basic understanding of patient centered plan of care established today  Interventions related to Anxiety, Depression, Bipolar Disorder, and Paraesophageal hernia :  . Evaluation of current treatment plans and patient's adherence to plan as established by provider- the patient has a follow up for consultation for evaluation of paraesophageal hernia on 06-28-2019.  The patient is supposed to have a barium swallow procedure tomorrow (07-06-2019). She does not know if she will keep the appointment. The patient says that she does not know if her husband will come and get her. The patient is stressed over this.  Empathetic listening. Offered an urgent care guide referral for transportation for the patient but the patient declined.   The patient has decided to call her friend who helps her to see if she could be back up for transportation if her husband is not willing to help her.  Expressed to the patient the need to follow through with procedures and testing so the medical team can determine the best treatment plan for managing her large hernia that is growing. The patient did not go and have her barium swallow as she felt she was doing better and not it is not needed. The patient called today in distress asking the RNCM if she went to the hospital by 911 would they see that she needs surgery and allow her to have the surgery to repair the growing paraesophageal hernia. Explained to the patient if she was in respiratory distress she should go and be evaluated at the hospital but the [facility] could not predict how they would treat the patient. The patient apologized for lying about going to have her barium swallow.  . Assessed patient understanding of disease states- the patient states the doctor at the hospital told her the hernia has gotten so large that medication will no longer help and surgical intervention needs to be considered. The patient is having a hard time with becoming short of breath at night when trying to sleep. She is propping herself up and doing the best she can. She did not qualify for home oxygen but feels she needs it, especially at night. This is an ongoing concern of the patient. The patient is having an acute exacerbation today and feels she needs to be evaluated in the ER. Marland Kitchen Secure chat sent to pcp Cyndia Skeeters letting the pcp know  of the patient calling with concerns of exacerbation and recommendation of the patient going to be evaluated at the ER. . Assessed patient's education and care coordination needs . Provided disease specific education to patient- education on eating foods that are soft and easy to swallow, education on sitting straight up when eating to prevent choking, education on staying hydrated and  monitor for s/s of dehydration. The patient is drinking "plenty of water", also she is drinking gatorade and Ensure at times. The patient states she is being careful when eating. The patient is managing her symptoms well during the day with positive changes in her diet.  Steele Sizer with appropriate clinical care team members regarding patient needs: Ongoing support from LCSW for stress, anxiety, and depression. Pharmacy consult for recommendations and help with clarification of omeprazole dosage and recommendations . Collaboration with pcp regarding patients stated concerns with omeprazole dosage, breathing concerns, especially at night. Recommended the patient write down questions for pcp and take to post discharge appointment with Danielle Rankin, NP on 3/9.  The patient verbalized understanding. Completed the appointment.  . Education after the patient called and left a VM stating she did not feel she needed to come to see the pcp on 3/9.  The patient states she has not been eating fried foods and eating smaller portions and she feels so much better and she did not see why she should come in to see Joni Reining on 3/9.  Explained to the patient the need to keep the appointment due to being in the hospital and this would be a post hospital follow up and it would be good for pcp to evaluate her post discharge. The patient verbalized understanding and states she would still come to the appointment on 3/9.  No other concerns at this time. Completed.  . Evaluation of patients concerns about upcoming testing and procedures scheduled. The patient feels that since she is managing her symptoms well and not having any flare ups right now that she is fine and does not need to move forward with surgical intervention. Encouraged the patient to talk to her medical team and told her that right now she may be feeling okay but what if she has a flare up and has to be hospitalized again. The patient is concerned over her husband  not being supportive in her current health concerns. The patient is considering abandoning further testing at this time.  Encouraged the patient to really think about what she needs to do to maintain her health and well being and how long it took her to get referrals. The patient verbalized she will do what she needs to do. [facility] for talking to her and listening to her. Will let pcp know RNCM and patient concerns. The patient did not go to scheduled testing and today is having exacerbation of symptoms related to anxiety and hernia. The patient was advised to be evaluated at the ER for further recommendations and treatment.   Patient Self Care Activities related to Anxiety, Depression, Bipolar Disorder, and Paraesophageal hernia :  . Patient is unable to independently self-manage chronic health conditions  Please see past updates related to this goal by clicking on the "Past Updates" button in the selected goal         Patient verbalizes understanding of instructions provided today.   The care management team will reach out to the patient again over the next 30 to 60 days.   Alto Denver RN, MSN, CCM [facility]  [facility]  [facility] [employer] Mobile: [phone]

## 2019-07-18 ENCOUNTER — Other Ambulatory Visit: Payer: Self-pay

## 2019-07-18 MED ORDER — NA SULFATE-K SULFATE-MG SULF 17.5-3.13-1.6 GM/177ML PO SOLN: 1.0000 | Freq: Once | ORAL | 0 refills | Status: AC

## 2019-07-18 NOTE — Telephone Encounter (Signed)
Returned pt's phone call regarding her upcoming procedure.  Unable to contact, LVM to return call

## 2019-07-18 NOTE — Telephone Encounter (Signed)
Spoke with pt regarding her upcoming colonoscopy procedure. Pt has requested to reschedule her procedure to a later date as she forgot to go for the COVID test. Procedure has been rescheduled to 07-27-19.

## 2019-07-19 ENCOUNTER — Telehealth: Payer: Self-pay

## 2019-07-19 NOTE — Telephone Encounter (Signed)
Copied from [place] [id number]. Topic: General - Inquiry >> Jul 18, 2019  2:38 PM Alease Frame wrote: Reason for [id number] is requesting a call back from dr office regarding a bowl prep kit that she's not sure if she should use it and wanted more info . Please advise   I attempted to contact the patient back to f/u on her concerns, no answer. I left [facility] phone number on her voicemail with instruction to contact them with her bowel prep concerns.

## 2019-07-20 ENCOUNTER — Ambulatory Visit: Payer: Medicare HMO | Admitting: Cardiology

## 2019-07-20 ENCOUNTER — Ambulatory Visit: Payer: Medicare HMO

## 2019-07-23 ENCOUNTER — Encounter: Payer: Self-pay | Admitting: Cardiology

## 2019-07-24 ENCOUNTER — Other Ambulatory Visit: Payer: Medicare HMO

## 2019-07-24 ENCOUNTER — Ambulatory Visit: Payer: Medicare HMO | Admitting: General Surgery

## 2019-07-24 ENCOUNTER — Ambulatory Visit: Payer: Self-pay | Admitting: Licensed Clinical Social Worker

## 2019-07-24 DIAGNOSIS — F319 Bipolar disorder, unspecified: Secondary | ICD-10-CM

## 2019-07-24 DIAGNOSIS — J9601 Acute respiratory failure with hypoxia: Secondary | ICD-10-CM

## 2019-07-24 DIAGNOSIS — F418 Other specified anxiety disorders: Secondary | ICD-10-CM

## 2019-07-24 DIAGNOSIS — F419 Anxiety disorder, unspecified: Secondary | ICD-10-CM

## 2019-07-24 DIAGNOSIS — K2100 Gastro-esophageal reflux disease with esophagitis, without bleeding: Secondary | ICD-10-CM

## 2019-07-24 DIAGNOSIS — Z79899 Other long term (current) drug therapy: Secondary | ICD-10-CM

## 2019-07-24 DIAGNOSIS — K219 Gastro-esophageal reflux disease without esophagitis: Secondary | ICD-10-CM

## 2019-07-24 NOTE — Chronic Care Management (AMB) (Signed)
Chronic Care Management    Clinical Social Work Follow Up Note  07/24/2019 Name: Katrina Sutton MRN: [id number] DOB: 08/19/1950  Katrina Sutton is a 69 y.o. year old female who is a primary care patient of Tarri Fuller, FNP. The CCM team was consulted for assistance with Mental Health Counseling and Resources.   Review of patient status, including review of consultants reports, other relevant assessments, and collaboration with appropriate care team members and the patient's provider was performed as part of comprehensive patient evaluation and provision of chronic care management services.    SDOH (Social Determinants of Health) assessments performed: Yes    Outpatient Encounter Medications as of 07/24/2019  Medication Sig Note  . acetaminophen (TYLENOL) 500 MG tablet Take 500 mg by mouth every 8 (eight) hours as needed for headache.   . clonazePAM (KLONOPIN) 1 MG tablet TAKE 1 TABLET BY MOUTH TWICE A DAY AS NEEDED FOR ANXIETY   . clotrimazole-betamethasone (LOTRISONE) cream  06/04/2019: As needed for redness  . hydrocortisone cream 1 % Apply 1 application topically 2 (two) times daily as needed for itching. 06/19/2019: Very seldom   . linaclotide (LINZESS) 145 MCG CAPS capsule Take 145 mcg by mouth daily before breakfast.   . linaclotide (LINZESS) 145 MCG CAPS capsule Take 1 capsule (145 mcg total) by mouth daily before breakfast.   . omeprazole (PRILOSEC) 20 MG capsule Take 1 capsule (20 mg total) by mouth daily. In evening   . omeprazole (PRILOSEC) 40 MG capsule Take 1 capsule (40 mg total) by mouth daily. In morning   . PARoxetine (PAXIL) 40 MG tablet TAKE 1 TABLET BY MOUTH DAILY   . polyethylene glycol (MIRALAX / GLYCOLAX) packet Take 17 g by mouth daily as needed.    . senna (SENOKOT) 8.6 MG TABS tablet Take 1 tablet by mouth daily as needed for mild constipation.   . sodium chloride (OCEAN) 0.65 % SOLN nasal spray Place 1 spray into both nostrils as needed for congestion.    No  facility-administered encounter medications on file as of 07/24/2019.     Goals Addressed    . SW- I would like to gain additional support to manage my anxiety better (pt-stated)       Current Barriers:  . Limited social support . Mental Health Concerns  . Social Isolation . Limited access to caregiver . Lacks knowledge of community resource: available mental health resources that she can utilize   Clinical Social Work Clinical Goal(s):  Marland Kitchen Over the next 120 days, client will work with SW to address concerns related to decreasing anxious symptoms by implementing additional mental health support and coping skills into her routine.   Interventions: . Patient interviewed and appropriate assessments performed . Provided mental health counseling with regard to patient was emotional throughout phone call due to her daily difficulty with mobility/weakness/balance. LCSW used active and reflective listening and implemented appropriate interventions to help suppport patient and her emotional needs. Advised patient to implement deep breathing/grounding/meditation/self-care exercises into her daily routine to combat stressors. Education provided.  . Discussed plans with patient for ongoing care management follow up and provided patient with direct contact information for care management team . Advised patient to implement deep breathing exercises into her routine whenever anxiety/stress arise to help bring her back to the present state.  . Assisted patient/caregiver with obtaining information about health plan benefits . Provided education and assistance to client regarding Advanced Directives. . Provided education to patient/caregiver regarding level of care options. Marland Kitchen  Encouraged patient to consider [place] [facility] [place] (mental health provider) for long term follow up and therapy/counseling. PCP made a referral in August but [name] was never able to establish contact with patient after 3  unsuccessfully calls. LCSW provided patient with their contact information and encouraged her to contact them but patient declined services as she reports that her depression/anxiety has improved. . Patient reports improvement with her bowels. . Patient reports that she has a strong social support network of family and friends. . Patient shares that her stomach issues trigger when anxiety arises. She continues to take stool softeners when needed. LCSW provided coping skill education for anxiety management. Patient was receptive to education provided. . Patient reports that she has been implementing more socialization into her routine which has been helpful. . Praise provided as patient has put in a lot of work by challenging her negative thinking. Patient shares that she is starting to speak up more for herself and asking for help when needed. Patient shares an example of this by asking her husband to help her with cleaning their home. . Patient shares that she receives daily active socialization because she gets out of the house everyday to run errands and to get outside some. Patient reports that her spouse and son gave her flowers for Mother's Day and she went out to eat with her family to celebrate at well.  . Patient reports stable transportation to her upcoming colonoscopy procedure on 07/27/19. She reports that her spouse is off work and will transport her.  . Patient denies any urgent concerns and reports that her SOB has improved.  Patient Self Care Activities:  . Attends all scheduled provider appointments . Lacks social connections  Please see past updates related to this goal by clicking on the "Past Updates" button in the selected goal       Follow Up Plan: SW will follow up with patient by phone over the next quarter  Eula Fried, [place], MSW, [email or web address] Phone: [phone]

## 2019-07-25 ENCOUNTER — Ambulatory Visit: Payer: Medicare HMO

## 2019-07-27 ENCOUNTER — Ambulatory Visit: Admission: RE | Admit: 2019-07-27 | Payer: Medicare HMO | Source: Home / Self Care | Admitting: Gastroenterology

## 2019-07-27 ENCOUNTER — Encounter: Admission: RE | Payer: Self-pay | Source: Home / Self Care

## 2019-07-27 SURGERY — COLONOSCOPY WITH PROPOFOL
Anesthesia: General

## 2019-08-01 ENCOUNTER — Ambulatory Visit: Payer: Medicare HMO | Admitting: Surgery

## 2019-08-06 ENCOUNTER — Other Ambulatory Visit: Payer: Self-pay | Admitting: Family Medicine

## 2019-08-06 DIAGNOSIS — F419 Anxiety disorder, unspecified: Secondary | ICD-10-CM

## 2019-08-06 NOTE — Telephone Encounter (Signed)
Requested medication (s) are due for refill today -yes  Requested medication (s) are on the active medication list -yes  Future visit scheduled -yes  Last refill: -07/11/19  Notes to clinic: Request for non delegated Rx  Requested Prescriptions  Pending Prescriptions Disp Refills   clonazePAM (KLONOPIN) 1 MG tablet [Pharmacy Med Name: CLONAZEPAM 1 MG TAB] 60 tablet     Sig: TAKE 1 TABLET BY MOUTH TWICE A DAY AS NEEDED FOR ANXIETY      Not Delegated - Psychiatry:  Anxiolytics/Hypnotics Failed - 08/06/2019  3:14 PM      Failed - This refill cannot be delegated      Failed - Urine Drug Screen completed in last 360 days.      Passed - Valid encounter within last 6 months    Recent Outpatient Visits           2 months ago Paraesophageal hernia   [facility], Jodelle Gross, FNP   3 months ago Leg swelling   [facility], Jodelle Gross, FNP   3 months ago Gastroesophageal reflux disease, unspecified whether esophagitis present   [facility], Jodelle Gross, FNP   9 months ago Gastroesophageal reflux disease with esophagitis   [facility] Galen Manila, NP   10 months ago Candidal intertrigo   [facility] Althea Charon, Netta Neat, DO       Future Appointments             In 10 months [facility], [facility]                 Requested Prescriptions  Pending Prescriptions Disp Refills   clonazePAM (KLONOPIN) 1 MG tablet [Pharmacy Med Name: CLONAZEPAM 1 MG TAB] 60 tablet     Sig: TAKE 1 TABLET BY MOUTH TWICE A DAY AS NEEDED FOR ANXIETY      Not Delegated - Psychiatry:  Anxiolytics/Hypnotics Failed - 08/06/2019  3:14 PM      Failed - This refill cannot be delegated      Failed - Urine Drug Screen completed in last 360 days.      Passed - Valid encounter within last 6 months    Recent Outpatient Visits           2 months ago Paraesophageal hernia   [facility], Jodelle Gross, FNP   3 months ago Leg swelling   [facility], Jodelle Gross, FNP   3 months ago Gastroesophageal reflux disease, unspecified whether esophagitis present   [facility], Jodelle Gross, FNP   9 months ago Gastroesophageal reflux disease with esophagitis   [facility] Galen Manila, NP   10 months ago Candidal intertrigo   [facility] Althea Charon, Netta Neat, DO       Future Appointments             In 10 months [facility], [facility]

## 2019-08-06 NOTE — Telephone Encounter (Signed)
Personally reviewed [place] today, 08/06/19.  According to PMP aware, pt last received a refill on 07/11/2019.  Refill of her controlled substance will be provided today.

## 2019-08-29 ENCOUNTER — Telehealth: Payer: Self-pay

## 2019-08-29 NOTE — Telephone Encounter (Signed)
Pt has been scheduled 1:40 on 6/17. Pt will come same time as her son who also has appt.. Nothing further needed.

## 2019-08-29 NOTE — Telephone Encounter (Signed)
I attempted to contact the patient to f/u with her concerning a f/u appt. Her son Jonny Ruiz has an appt scheduled for tomorrow at 1:20pm and I was going to see if Rakeb would like to schedule her f/u anxiety appt at 1:40pm w/ Joni Reining.

## 2019-08-30 ENCOUNTER — Other Ambulatory Visit: Payer: Self-pay

## 2019-08-30 ENCOUNTER — Ambulatory Visit (INDEPENDENT_AMBULATORY_CARE_PROVIDER_SITE_OTHER): Payer: Medicare HMO | Admitting: Family Medicine

## 2019-08-30 ENCOUNTER — Encounter: Payer: Self-pay | Admitting: Family Medicine

## 2019-08-30 VITALS — BP 147/85 | HR 80 | Temp 96.9°F | Resp 18 | Ht 66.0 in | Wt 171.4 lb

## 2019-08-30 DIAGNOSIS — M7989 Other specified soft tissue disorders: Secondary | ICD-10-CM

## 2019-08-30 DIAGNOSIS — I10 Essential (primary) hypertension: Secondary | ICD-10-CM | POA: Diagnosis not present

## 2019-08-30 DIAGNOSIS — Z79899 Other long term (current) drug therapy: Secondary | ICD-10-CM

## 2019-08-30 DIAGNOSIS — F419 Anxiety disorder, unspecified: Secondary | ICD-10-CM

## 2019-08-30 DIAGNOSIS — R635 Abnormal weight gain: Secondary | ICD-10-CM | POA: Diagnosis not present

## 2019-08-30 MED ORDER — LISINOPRIL 10 MG PO TABS: 10.0000 mg | ORAL_TABLET | Freq: Every day | ORAL | 3 refills | Status: DC

## 2019-08-30 NOTE — Progress Notes (Signed)
Subjective:    Patient ID: Katrina Sutton, female    DOB: [birth year], 69 y.o.   MRN: [id number]  Katrina Sutton is a 69 y.o. female presenting on 08/30/2019 for Anxiety and Hernia   HPI  Katrina Sutton presents to clinic for follow up visit on her anxiety.  Reports that Katrina Sutton has been managing well with her current medication regimen.  Denies any worsening of anxiety/depression symptoms.  Reports Katrina Sutton is meeting with Dr. Fredirick Maudlin next week to discuss her umbilical hernia and a possible repair.  Has a history of a paraesophageal hernia that needs a barium swallow before scheduling with general surgery.  Katrina Sutton reports that Katrina Sutton is unsure that Katrina Sutton wants to proceed with that surgery, but agreed will discuss with Dr. Celine Ahr for her thoughts and if believes would be beneficial, will think about it.    Depression screen [facility] 2/9 08/30/2019 06/19/2019 05/22/2019  Decreased Interest 0 0 0  Down, Depressed, Hopeless 1 0 1  PHQ - 2 Score 1 0 1  Altered sleeping 1 - 2  Tired, decreased energy 1 - 3  Change in appetite 0 - 0  Feeling bad or failure about yourself  1 - 0  Trouble concentrating 1 - 0  Moving slowly or fidgety/restless 0 - 0  Suicidal thoughts 0 - 0  PHQ-9 Score 5 - 6  Difficult doing work/chores Not difficult at all - Not difficult at all  Some recent data might be hidden    Social History   Tobacco Use  . Smoking status: Former Smoker    Quit date: 12/27/1976    Years since quitting: 42.7  . Smokeless tobacco: Never Used  Vaping Use  . Vaping Use: Never used  Substance Use Topics  . Alcohol use: No  . Drug use: No    Review of Systems  Constitutional: Negative.   HENT: Negative.   Eyes: Negative.   Respiratory: Negative.   Cardiovascular: Negative.   Gastrointestinal: Negative.        Umbilical hernia  Endocrine: Negative.   Genitourinary: Negative.   Musculoskeletal: Negative.   Skin: Negative.   Allergic/Immunologic: Negative.   Neurological: Negative.    Hematological: Negative.   Psychiatric/Behavioral: Negative.    Per HPI unless specifically indicated above     Objective:    BP (!) 147/85 (BP Location: Left Arm, Patient Position: Sitting, Cuff Size: Normal)   Pulse 80   Temp (!) 96.9 F (36.1 C) (Temporal)   Resp 18   Ht 5\' 6"  (1.676 m)   Wt 171 lb 6.4 oz (77.7 kg)   SpO2 96%   BMI 27.66 kg/m   Wt Readings from Last 3 Encounters:  08/30/19 171 lb 6.4 oz (77.7 kg)  07/04/19 173 lb (78.5 kg)  06/28/19 172 lb 6.4 oz (78.2 kg)    Physical Exam Vitals reviewed.  Constitutional:      General: Katrina Sutton is not in acute distress.    Appearance: Normal appearance. Katrina Sutton is well-developed, well-groomed and overweight. Katrina Sutton is not ill-appearing or toxic-appearing.  HENT:     Head: Normocephalic.     Nose:     Comments: Lizbeth Bark is in place, covering mouth and nose  Eyes:     General: Lids are normal. Vision grossly intact.        Right eye: No discharge.        Left eye: No discharge.     Extraocular Movements: Extraocular movements intact.     Conjunctiva/sclera: Conjunctivae  normal.     Pupils: Pupils are equal, round, and reactive to light.  Cardiovascular:     Rate and Rhythm: Normal rate and regular rhythm.     Pulses: Normal pulses.          Dorsalis pedis pulses are 2+ on the right side and 2+ on the left side.     Heart sounds: Normal heart sounds. No murmur heard.  No friction rub. No gallop.   Pulmonary:     Effort: Pulmonary effort is normal. No respiratory distress.     Breath sounds: Normal breath sounds.  Abdominal:     General: Abdomen is flat. Bowel sounds are normal. There is no distension.     Palpations: Abdomen is soft.     Tenderness: There is no abdominal tenderness. There is no guarding or rebound.     Hernia: A hernia is present.  Musculoskeletal:     Right lower leg: 2+ Edema present.     Left lower leg: 2+ Edema present.  Feet:     Right foot:     Skin integrity: Skin integrity normal.     Left  foot:     Skin integrity: Skin integrity normal.  Skin:    General: Skin is warm and dry.     Capillary Refill: Capillary refill takes less than 2 seconds.  Neurological:     General: No focal deficit present.     Mental Status: Katrina Sutton is alert and oriented to person, place, and time.     Cranial Nerves: No cranial nerve deficit.     Sensory: No sensory deficit.     Motor: No weakness.     Coordination: Coordination normal.     Gait: Gait normal.  Psychiatric:        Attention and Perception: Attention and perception normal.        Mood and Affect: Mood and affect normal.        Speech: Speech normal.        Behavior: Behavior normal. Behavior is cooperative.        Thought Content: Thought content normal.        Cognition and Memory: Cognition and memory normal.        Judgment: Judgment normal.    Results for orders placed or performed during the hospital encounter of 05/08/19  Respiratory Panel by RT PCR (Flu A&B, Covid) - Nasopharyngeal Swab   Specimen: Nasopharyngeal Swab  Result Value Ref Range   SARS Coronavirus 2 by RT PCR NEGATIVE NEGATIVE   Influenza A by PCR NEGATIVE NEGATIVE   Influenza B by PCR NEGATIVE NEGATIVE  CBC  Result Value Ref Range   WBC 8.6 4.0 - 10.5 K/uL   RBC 5.07 3.87 - 5.11 MIL/uL   Hemoglobin 15.3 (H) 12.0 - 15.0 g/dL   HCT 56.8 36 - 46 %   MCV 88.4 80.0 - 100.0 fL   MCH 30.2 26.0 - 34.0 pg   MCHC 34.2 30.0 - 36.0 g/dL   RDW 12.7 51.7 - 00.1 %   Platelets 260 150 - 400 K/uL   nRBC 0.0 0.0 - 0.2 %  Basic metabolic panel  Result Value Ref Range   Sodium 137 135 - 145 mmol/L   Potassium 3.7 3.5 - 5.1 mmol/L   Chloride 101 98 - 111 mmol/L   CO2 25 22 - 32 mmol/L   Glucose, Bld 99 70 - 99 mg/dL   BUN 12 8 - 23 mg/dL   Creatinine, Ser 7.49  0.44 - 1.00 mg/dL   Calcium 9.4 8.9 - 85.6 mg/dL   GFR calc non Af Amer >60 >60 mL/min   GFR calc Af Amer >60 >60 mL/min   Anion gap 11 5 - 15  Brain natriuretic peptide  Result Value Ref Range   B  Natriuretic Peptide 59.0 0.0 - 100.0 pg/mL  CBC  Result Value Ref Range   WBC 7.7 4.0 - 10.5 K/uL   RBC 4.78 3.87 - 5.11 MIL/uL   Hemoglobin 14.4 12.0 - 15.0 g/dL   HCT 31.4 36 - 46 %   MCV 90.4 80.0 - 100.0 fL   MCH 30.1 26.0 - 34.0 pg   MCHC 33.3 30.0 - 36.0 g/dL   RDW 97.0 26.3 - 78.5 %   Platelets 215 150 - 400 K/uL   nRBC 0.0 0.0 - 0.2 %  Basic metabolic panel  Result Value Ref Range   Sodium 142 135 - 145 mmol/L   Potassium 3.9 3.5 - 5.1 mmol/L   Chloride 105 98 - 111 mmol/L   CO2 29 22 - 32 mmol/L   Glucose, Bld 98 70 - 99 mg/dL   BUN 11 8 - 23 mg/dL   Creatinine, Ser 8.85 0.44 - 1.00 mg/dL   Calcium 9.2 8.9 - 02.7 mg/dL   GFR calc non Af Amer >60 >60 mL/min   GFR calc Af Amer >60 >60 mL/min   Anion gap 8 5 - 15  Blood gas, arterial  Result Value Ref Range   FIO2 0.21    Delivery systems ROOM AIR    pH, Arterial 7.38 7.35 - 7.45   pCO2 arterial 52 (H) 32 - 48 mmHg   pO2, Arterial 69 (L) 83 - 108 mmHg   Bicarbonate 30.8 (H) 20.0 - 28.0 mmol/L   Acid-Base Excess 4.4 (H) 0.0 - 2.0 mmol/L   O2 Saturation 93.2 %   Patient temperature 37.0    Collection site RIGHT RADIAL    Sample type ARTERIAL DRAW    Allens test (pass/fail) PASS PASS  ECHOCARDIOGRAM COMPLETE  Result Value Ref Range   Weight 2,737.23 oz   Height 66 in   BP 156/85 mmHg  Troponin I (High Sensitivity)  Result Value Ref Range   Troponin I (High Sensitivity) 4 <18 ng/L  Troponin I (High Sensitivity)  Result Value Ref Range   Troponin I (High Sensitivity) 4 <18 ng/L      Assessment & Plan:   Problem List Items Addressed This Visit      Cardiovascular and Mediastinum   Hypertension - Primary    Elevated blood pressures in clinic in past, no diagnosis of hypertension until today's visit in clinic.  Discussed hypertension, importance of having readings that are under 130/80.  Discussion with patient regarding hypertension as well as the effects on the organs and body.  Specifically, we spoke about  kidney disease, kidney failure, heart attack, stroke and up to and including death, as likely outcomes if non-compliant with blood pressure regulation.  Discussed how all of these habits are attached to each other and each has the effect on each other.  Patient in agreement to beginning hypertension medication for management.  Plan: 1. Begin Lisinopril 10mg  daily. 2. Obtain labs 2 weeks  3. Encouraged heart healthy diet and increasing exercise to 30 minutes most days of the week, going no more than 2 days in a row without exercise. 4. Check BP 1-2 x per week at home, keep log, and bring to clinic at next  appointment. 5. Follow up 2 weeks.         Relevant Medications   lisinopril (ZESTRIL) 10 MG tablet   Other Relevant Orders   CBC with Differential   COMPLETE METABOLIC PANEL WITH GFR     Other   Anxiety    Patient presently well controlled with current treatment plan of paroxetine 40mg  daily and clonazepam 1mg  BID PRN for anxiety.  Mood handout given and reviewed.  Plan: 1. Continue paroxetine 40mg  daily and clonazepam 1mg  BID PRN for anxiety 2. Review mood handout for additional assistance with meditation, guided imagery, and mindfulness 3. Follow up in 6 months      Leg swelling    Continued +2 bilateral lower extremity edema.  Discussed use of compression stockings to help with lower extremity edema.  Plan: 1. Begin using compression stockings 2. Follow up in 2 weeks       Other Visit Diagnoses    Weight gain       Relevant Orders   Thyroid Panel With TSH   Long-term use of high-risk medication       Relevant Orders   Lipid Profile      Meds ordered this encounter  Medications  . lisinopril (ZESTRIL) 10 MG tablet    Sig: Take 1 tablet (10 mg total) by mouth daily.    Dispense:  90 tablet    Refill:  3      Follow up plan: Return in about 2 weeks (around 09/13/2019) for Hypertension f/u.   , FNP Family Nurse Practitioner [facility] 08/30/2019, 3:22 PM

## 2019-08-30 NOTE — Patient Instructions (Signed)
START lisinopril 10 mg once daily.    Some of the possible side effects are:   - angioedema: swelling of lips, mouth, and tongue.  If this rare side effect occurs, please go to ED. - cough: you could develop a dry, hacking cough caused by this medicine.  If it occurs, it will go away after stopping this medicine.  Call the clinic before stopping the medication. - kidney damage: we will monitor your labs when we start this medicine and at least one time per year.  If you do not have an change in kidney function when starting this medicine, it will provide kidney protection over time.  Try to get exercise a minimum of 30 minutes per day at least 5 days per week as well as  adequate water intake all while measuring blood pressure a few times per week.  Keep a blood pressure log and bring back to clinic at your next visit.  If your readings are consistently over 130/80 to contact our office/send me a MyChart message and we will see you sooner.  Can try DASH and Mediterranean diet options, avoiding processed foods, lowering sodium intake, avoiding pork products, and eating a plant based diet for optimal health.  The following recommendations are helpful adjuncts for helping rebalance your mood.  Eat a nourishing diet. Ensure adequate intake of calories, protein, carbs, fat, vitamins, and minerals. Prioritize whole foods at each meal, including meats, vegetables, fruits, nuts and seeds, etc.   Avoid inflammatory and/or "junk" foods, such as sugar, omega-6 fats, refined grains, chemicals, and preservatives are common in packaged and prepared foods. Minimize or completely avoid these ingredients and stick to whole foods with little to no additives. Cook from scratch as much as possible for more control over what you eat  Get enough sleep. Poor sleep is significantly associated with depression and anxiety. Make 7-9 hours of sleep nightly a top priority  Exercise appropriately. Exercise is known to improve  brain functioning and boost mood. Aim for 30 minutes of daily physical activity. Avoid "overtraining," which can cause mental disturbances  Assess your light exposure. Not enough natural light during the day and too much artificial light can have a major impact on your mood. Get outside as often as possible during daylight hours. Minimize light exposure after dark and avoid the use of electronics that give off blue light before bed  Manage your stress.  Use daily stress management techniques such as meditation, yoga, or mindfulness to retrain your brain to respond differently to stress. Try deep breathing to deactivate your "fight or flight" response.  There are many of sources with apps like Headspace, Calm or a variety of YouTube videos (videos from Gwynne Edinger have guided meditation)  Prioritize your social life. Work on building social support with new friends or improve current relationships. Consider getting a pet that allows for companionship, social interaction, and physical touch. Try volunteering or joining a faith-based community to increase your sense of purpose  4-7-8 breathing technique at bedtime: breathe in to count of 4, hold breath for count of 7, exhale for count of 8; do 3-5 times for letting go of overactive thoughts  Take time to play Unstructured "play" time can help reduce anxiety and depression Options for play include music, games, sports, dance, art, etc.  Try to add daily omega 3 fatty acids, magnesium, B complex, and balanced amino acid supplements to help improve mood and anxiety.  We will plan to see you back in 2  weeks for hypertension follow up and lab draw  You will receive a survey after today's visit either digitally by e-mail or paper by [employer]. Your experiences and feedback matter to [place].  Please respond so we know how we are doing as we provide care for you.  Call us with any questions/concerns/needs.  It is my goal to be available to you for your health  concerns.  Thanks for choosing me to be a partner in your healthcare needs!  Charlaine Dalton, FNP-C Family Nurse Practitioner [facility] Phone: [phone]

## 2019-08-30 NOTE — Assessment & Plan Note (Signed)
Continued +2 bilateral lower extremity edema.  Discussed use of compression stockings to help with lower extremity edema.  Plan: 1. Begin using compression stockings 2. Follow up in 2 weeks

## 2019-08-30 NOTE — Assessment & Plan Note (Signed)
Elevated blood pressures in clinic in past, no diagnosis of hypertension until today's visit in clinic.  Discussed hypertension, importance of having readings that are under 130/80.  Discussion with patient regarding hypertension as well as the effects on the organs and body.  Specifically, we spoke about kidney disease, kidney failure, heart attack, stroke and up to and including death, as likely outcomes if non-compliant with blood pressure regulation.  Discussed how all of these habits are attached to each other and each has the effect on each other.  Patient in agreement to beginning hypertension medication for management.  Plan: 1. Begin Lisinopril 10mg  daily. 2. Obtain labs 2 weeks  3. Encouraged heart healthy diet and increasing exercise to 30 minutes most days of the week, going no more than 2 days in a row without exercise. 4. Check BP 1-2 x per week at home, keep log, and bring to clinic at next appointment. 5. Follow up 2 weeks.

## 2019-08-30 NOTE — Assessment & Plan Note (Signed)
Patient presently well controlled with current treatment plan of paroxetine 40mg  daily and clonazepam 1mg  BID PRN for anxiety.  Mood handout given and reviewed.  Plan: 1. Continue paroxetine 40mg  daily and clonazepam 1mg  BID PRN for anxiety 2. Review mood handout for additional assistance with meditation, guided imagery, and mindfulness 3. Follow up in 6 months

## 2019-09-04 ENCOUNTER — Ambulatory Visit: Payer: Medicare HMO | Admitting: General Surgery

## 2019-09-05 ENCOUNTER — Other Ambulatory Visit: Payer: Self-pay | Admitting: Family Medicine

## 2019-09-05 DIAGNOSIS — F419 Anxiety disorder, unspecified: Secondary | ICD-10-CM

## 2019-09-05 NOTE — Telephone Encounter (Signed)
Personally reviewed [place] today, 09/05/19.  According to PMP aware, pt last received a refill on 08/08/2019.  Refill of her controlled substance will be provided today.

## 2019-09-05 NOTE — Telephone Encounter (Signed)
Requested medication (s) are due for refill today Yes  Requested medication (s) are on the active medication list Yes  Future visit scheduled 09/13/19.  Note to clinic-This medication not delegated for [facility] to refill. Routing to provider for consideration.   Requested Prescriptions  Pending Prescriptions Disp Refills   clonazePAM (KLONOPIN) 1 MG tablet [Pharmacy Med Name: CLONAZEPAM 1 MG TAB] 60 tablet     Sig: TAKE 1 TABLET BY MOUTH TWICE A DAY AS NEEDED FOR ANXIETY      Not Delegated - Psychiatry:  Anxiolytics/Hypnotics Failed - 09/05/2019  9:55 AM      Failed - This refill cannot be delegated      Failed - Urine Drug Screen completed in last 360 days.      Passed - Valid encounter within last 6 months    Recent Outpatient Visits           6 days ago Hypertension, unspecified type   [facility], Jodelle Gross, FNP   3 months ago Paraesophageal hernia   [facility], Jodelle Gross, FNP   4 months ago Leg swelling   [facility], Jodelle Gross, FNP   4 months ago Gastroesophageal reflux disease, unspecified whether esophagitis present   [facility], Jodelle Gross, FNP   10 months ago Gastroesophageal reflux disease with esophagitis   [facility], Alison Stalling, NP       Future Appointments             In 1 week [place], Jodelle Gross, FNP [facility], [facility]   In 4 weeks Duanne Guess, MD [facility]   In 2 months [place], Jodelle Gross, FNP [facility], [facility]   In 9 months  [facility], [facility]

## 2019-09-06 ENCOUNTER — Other Ambulatory Visit: Payer: Self-pay | Admitting: Family Medicine

## 2019-09-06 ENCOUNTER — Telehealth: Payer: Self-pay | Admitting: General Practice

## 2019-09-06 ENCOUNTER — Ambulatory Visit (INDEPENDENT_AMBULATORY_CARE_PROVIDER_SITE_OTHER): Payer: Medicare HMO | Admitting: General Practice

## 2019-09-06 DIAGNOSIS — F419 Anxiety disorder, unspecified: Secondary | ICD-10-CM

## 2019-09-06 DIAGNOSIS — K449 Diaphragmatic hernia without obstruction or gangrene: Secondary | ICD-10-CM

## 2019-09-06 DIAGNOSIS — F319 Bipolar disorder, unspecified: Secondary | ICD-10-CM

## 2019-09-06 DIAGNOSIS — I10 Essential (primary) hypertension: Secondary | ICD-10-CM

## 2019-09-06 DIAGNOSIS — F418 Other specified anxiety disorders: Secondary | ICD-10-CM

## 2019-09-06 MED ORDER — CLONAZEPAM 1 MG PO TABS: ORAL_TABLET | ORAL | 0 refills | Status: DC

## 2019-09-06 NOTE — Chronic Care Management (AMB) (Signed)
Chronic Care Management   Follow Up Note   09/06/2019 Name: Katrina Sutton MRN: [id number] DOB: 01-Jan-1951  Referred by: Verl Bangs, FNP Reason for referral : Chronic Care Management (Follow up: RNCM: Chronic Disease Management and Care coordination needs)   Katrina Sutton is a 69 y.o. year old female who is a primary care patient of Verl Bangs, FNP. The CCM team was consulted for assistance with chronic disease management and care coordination needs.    Review of patient status, including review of consultants reports, relevant laboratory and other test results, and collaboration with appropriate care team members and the patient's provider was performed as part of comprehensive patient evaluation and provision of chronic care management services.    SDOH (Social Determinants of Health) assessments performed: Yes See Care Plan activities for detailed interventions related to [name]     Outpatient Encounter Medications as of 09/06/2019  Medication Sig Note  . acetaminophen (TYLENOL) 500 MG tablet Take 500 mg by mouth every 8 (eight) hours as needed for headache.   . clonazePAM (KLONOPIN) 1 MG tablet TAKE 1 TABLET BY MOUTH TWICE A DAY AS NEEDED FOR ANXIETY   . clotrimazole-betamethasone (LOTRISONE) cream  (Patient not taking: Reported on 08/30/2019) 06/04/2019: As needed for redness  . hydrocortisone cream 1 % Apply 1 application topically 2 (two) times daily as needed for itching. (Patient not taking: Reported on 08/30/2019) 06/19/2019: Very seldom   . linaclotide (LINZESS) 145 MCG CAPS capsule Take 1 capsule (145 mcg total) by mouth daily before breakfast.   . lisinopril (ZESTRIL) 10 MG tablet Take 1 tablet (10 mg total) by mouth daily.   Marland Kitchen omeprazole (PRILOSEC) 20 MG capsule Take 1 capsule (20 mg total) by mouth daily. In evening   . omeprazole (PRILOSEC) 40 MG capsule Take 1 capsule (40 mg total) by mouth daily. In morning   . PARoxetine (PAXIL) 40 MG tablet TAKE 1 TABLET BY MOUTH DAILY    . polyethylene glycol (MIRALAX / GLYCOLAX) packet Take 17 g by mouth daily as needed.    . senna (SENOKOT) 8.6 MG TABS tablet Take 1 tablet by mouth daily as needed for mild constipation. (Patient not taking: Reported on 08/30/2019)   . sodium chloride (OCEAN) 0.65 % SOLN nasal spray Place 1 spray into both nostrils as needed for congestion.    No facility-administered encounter medications on file as of 09/06/2019.     Objective:  BP Readings from Last 3 Encounters:  09/05/19 103/69  08/30/19 (!) 147/85  07/04/19 (!) 153/95    Goals Addressed              This Visit's Progress   .  RNCM-Pt-"I am taking a new medication for my blood pressure" (pt-stated)        CARE PLAN ENTRY (see longtitudinal plan of care for additional care plan information)  Objective:  . Last practice recorded BP readings:  BP Readings from Last 3 Encounters:  08/30/19 (!) 147/85  07/04/19 (!) 153/95  06/28/19 (!) 158/98 .   Marland Kitchen Most recent eGFR/CrCl: No results found for: EGFR  No components found for: CRCL  Current Barriers:  Marland Kitchen Knowledge Deficits related to basic understanding of hypertension pathophysiology and self care management . Knowledge Deficits related to understanding of medications prescribed for management of hypertension . Limited Social Support  Case Manager Clinical Goal(s):  Marland Kitchen Over the next 120 days, patient will verbalize understanding of plan for hypertension management . Over the next 120 days, patient  will attend all scheduled medical appointments: next appointment with the pcp on 09-13-2019 . Over the next 120 days, patient will demonstrate improved adherence to prescribed treatment plan for hypertension as evidenced by taking all medications as prescribed, monitoring and recording blood pressure as directed, adhering to low sodium/DASH diet . Over the next 120 days, patient will demonstrate improved health management independence as evidenced by checking blood pressure as directed  and notifying PCP if SBP>130 or DBP > 80, taking all medications as prescribe, and adhering to a low sodium diet as discussed. o Recent blood pressure readings given to the [facility] by the patient: - 118/88- 08-31-2019 - 104/75, p-90-6-19-2021 - 103/69, p-81 09-05-2019 . Over the next 130 days, patient will verbalize basic understanding of hypertension disease process and self health management plan as evidenced by blood pressure controlled and no side effects noted from blood pressure regimen prescribed.  . Education provided on the side effects of Lisinopril and to take medication as prescribed. The patient verbalized understanding. The patient also ask about a refill for her Klonopin. The patient states the pharmacy does not have the refill yet. Education provided that a message will be sent to the pcp for refill. In basket message sent to pcp asking for Klonopin refill to be sent to the pharmacy.    Interventions:  Marland Kitchen UNABLE to independently:manage HTN . Evaluation of current treatment plan related to hypertension self management and patient's adherence to plan as established by provider. . Provided education to patient re: stroke prevention, s/s of heart attack and stroke, DASH diet, complications of uncontrolled blood pressure . Reviewed medications with patient and discussed importance of compliance . Discussed plans with patient for ongoing care management follow up and provided patient with direct contact information for care management team . Advised patient, providing education and rationale, to monitor blood pressure daily and record, calling PCP for findings outside established parameters.  . Reviewed scheduled/upcoming provider appointments including: 09-13-2019 with pcp  Patient Self Care Activities:  . UNABLE to independently manage HTN  Initial goal documentation      .  RNCM: PT-"I am doing the best I can managing my care" (pt-stated)        CARE PLAN ENTRY (see longtitudinal plan of  care for additional care plan information)  Current Barriers:  . Chronic Disease Management support, education, and care coordination needs related to Anxiety, Depression, Bipolar Disorder, and Paraesophageal hernia   Clinical Goal(s) related to Anxiety, Depression, Bipolar Disorder, and Paraesophageal hernia :  Over the next 120 days, patient will:  . Work with the care management team to address educational, disease management, and care coordination needs  . Begin or continue self health monitoring activities as directed today  follow a soft diet, sit up straight to prevent choking hazard, monitor for s/s of dehydration . Call provider office for new or worsened signs and symptoms Oxygen saturation lower than established parameter, Shortness of breath, and New or worsened symptom related to Anxiety/depression/bipolar disorder or complications from Paraesophageal hernia as evidence by recent hospitalization due to hypoxia  . Call care management team with questions or concerns . Verbalize basic understanding of patient centered plan of care established today  Interventions related to Anxiety, Depression, Bipolar Disorder, and Paraesophageal hernia :  . Evaluation of current treatment plans and patient's adherence to plan as established by provider- the patient has a follow up for consultation for evaluation of paraesophageal hernia on 06-28-2019.  The patient is supposed to have a barium  swallow procedure tomorrow (07-06-2019). She does not know if she will keep the appointment. The patient says that she does not know if her husband will come and get her. The patient is stressed over this.  Empathetic listening. Offered an urgent care guide referral for transportation for the patient but the patient declined.  The patient has decided to call her friend who helps her to see if she could be back up for transportation if her husband is not willing to help her.  Expressed to the patient the need to follow  through with procedures and testing so the medical team can determine the best treatment plan for managing her large hernia that is growing. The patient did not go and have her barium swallow as she felt she was doing better and not it is not needed. The patient called today in distress asking the RNCM if she went to the hospital by 911 would they see that she needs surgery and allow her to have the surgery to repair the growing paraesophageal hernia. Explained to the patient if she was in respiratory distress she should go and be evaluated at the hospital but the [facility] could not predict how they would treat the patient. The patient apologized for lying about going to have her barium swallow. 09-06-2019: The patient has put off further testing for Paraesophageal hernia. She states her husband had a "spell" and she had to look after him.  She says she is feeling good and things are stable. Education and support given.  . Assessed patient understanding of disease states- the patient states the doctor at the hospital told her the hernia has gotten so large that medication will no longer help and surgical intervention needs to be considered. The patient is having a hard time with becoming short of breath at night when trying to sleep. She is propping herself up and doing the best she can. She did not qualify for home oxygen but feels she needs it, especially at night. This is an ongoing concern of the patient. The patient is having an acute exacerbation today and feels she needs to be evaluated in the ER. 09-06-2019: The patient states that her hernias are both stable right now and she is watching her diet and is thankful that she has not had any exacerbations. Does have follow ups scheduled to see specialist.  . Secure chat sent to [place] letting the pcp know of the patient calling with concerns of exacerbation and recommendation of the patient going to be evaluated at the ER. Completed . Assessed patient's  education and care coordination needs . Provided disease specific education to patient- education on eating foods that are soft and easy to swallow, education on sitting straight up when eating to prevent choking, education on staying hydrated and monitor for s/s of dehydration. The patient is drinking "plenty of water", also she is drinking gatorade and Ensure at times. The patient states she is being careful when eating. The patient is managing her symptoms well during the day with positive changes in her diet. 09-06-2019: Ongoing support and review. Nash Dimmer with appropriate clinical care team members regarding patient needs: Ongoing support from LCSW for stress, anxiety, and depression. Pharmacy consult for recommendations and help with clarification of omeprazole dosage and recommendations.  09-06-2019- The patient continues to work with LCSW and pharmacist. Knows the CCM team is here to help with her health and wellness needs.  . Evaluation of patients concerns about upcoming testing and procedures  scheduled. The patient feels that since she is managing her symptoms well and not having any flare ups right now that she is fine and does not need to move forward with surgical intervention. Encouraged the patient to talk to her medical team and told her that right now she may be feeling okay but what if she has a flare up and has to be hospitalized again. The patient is concerned over her husband not being supportive in her current health concerns. The patient is considering abandoning further testing at this time.  Encouraged the patient to really think about what she needs to do to maintain her health and well being and how long it took her to get referrals. The patient verbalized she will do what she needs to do. Thanked the [facility] for talking to her and listening to her. Will let pcp know RNCM and patient concerns. The patient did not go to scheduled testing and today is having exacerbation of symptoms  related to anxiety and hernia. The patient was advised to be evaluated at the ER for further recommendations and treatment. 09-06-2019: The patient is stable at this time and denies the need for treatment for paraesophageal hernia at this time.   Patient Self Care Activities related to Anxiety, Depression, Bipolar Disorder, and Paraesophageal hernia :  . Patient is unable to independently self-manage chronic health conditions  Please see past updates related to this goal by clicking on the "Past Updates" button in the selected goal          Plan:   The care management team will reach out to the patient again over the next 30 to 60 days.    Noreene Larsson RN, MSN, [place] Mobile: [phone]

## 2019-09-06 NOTE — Patient Instructions (Signed)
Visit Information  Goals Addressed              This Visit's Progress     RNCM-Pt-"I am taking a new medication for my blood pressure" (pt-stated)        CARE PLAN ENTRY (see longtitudinal plan of care for additional care plan information)  Objective:   Last practice recorded BP readings:  BP Readings from Last 3 Encounters:  08/30/19 (!) 147/85  07/04/19 (!) 153/95  06/28/19 (!) 158/98     Most recent eGFR/CrCl: No results found for: EGFR  No components found for: CRCL  Current Barriers:   Knowledge Deficits related to basic understanding of hypertension pathophysiology and self care management  Knowledge Deficits related to understanding of medications prescribed for management of hypertension  Limited Social Support  Case Manager Clinical Goal(s):   Over the next 120 days, patient will verbalize understanding of plan for hypertension management  Over the next 120 days, patient will attend all scheduled medical appointments: next appointment with the pcp on 09-13-2019  Over the next 120 days, patient will demonstrate improved adherence to prescribed treatment plan for hypertension as evidenced by taking all medications as prescribed, monitoring and recording blood pressure as directed, adhering to low sodium/DASH diet  Over the next 120 days, patient will demonstrate improved health management independence as evidenced by checking blood pressure as directed and notifying PCP if SBP>130 or DBP > 80, taking all medications as prescribe, and adhering to a low sodium diet as discussed. o Recent blood pressure readings given to the [facility] by the patient: - 118/88- 08-31-2019 - 104/75, p-90-6-19-2021 - 103/69, p-81 09-05-2019  Over the next 130 days, patient will verbalize basic understanding of hypertension disease process and self health management plan as evidenced by blood pressure controlled and no side effects noted from blood pressure regimen prescribed.   Education  provided on the side effects of Lisinopril and to take medication as prescribed. The patient verbalized understanding. The patient also ask about a refill for her Klonopin. The patient states the pharmacy does not have the refill yet. Education provided that a message will be sent to the pcp for refill. In basket message sent to pcp asking for Klonopin refill to be sent to the pharmacy.    Interventions:   UNABLE to independently:manage HTN  Evaluation of current treatment plan related to hypertension self management and patient's adherence to plan as established by provider.  Provided education to patient re: stroke prevention, s/s of heart attack and stroke, DASH diet, complications of uncontrolled blood pressure  Reviewed medications with patient and discussed importance of compliance  Discussed plans with patient for ongoing care management follow up and provided patient with direct contact information for care management team  Advised patient, providing education and rationale, to monitor blood pressure daily and record, calling PCP for findings outside established parameters.   Reviewed scheduled/upcoming provider appointments including: 09-13-2019 with pcp  Patient Self Care Activities:   UNABLE to independently manage HTN  Initial goal documentation        RNCM: PT-"I am doing the best I can managing my care" (pt-stated)        CARE PLAN ENTRY (see longtitudinal plan of care for additional care plan information)  Current Barriers:   Chronic Disease Management support, education, and care coordination needs related to Anxiety, Depression, Bipolar Disorder, and Paraesophageal hernia   Clinical Goal(s) related to Anxiety, Depression, Bipolar Disorder, and Paraesophageal hernia :  Over the next 120 days,  patient will:   Work with the care management team to address educational, disease management, and care coordination needs   Begin or continue self health monitoring  activities as directed today  follow a soft diet, sit up straight to prevent choking hazard, monitor for s/s of dehydration  Call provider office for new or worsened signs and symptoms Oxygen saturation lower than established parameter, Shortness of breath, and New or worsened symptom related to Anxiety/depression/bipolar disorder or complications from Paraesophageal hernia as evidence by recent hospitalization due to hypoxia   Call care management team with questions or concerns  Verbalize basic understanding of patient centered plan of care established today  Interventions related to Anxiety, Depression, Bipolar Disorder, and Paraesophageal hernia :   Evaluation of current treatment plans and patient's adherence to plan as established by provider- the patient has a follow up for consultation for evaluation of paraesophageal hernia on 06-28-2019.  The patient is supposed to have a barium swallow procedure tomorrow (07-06-2019). She does not know if she will keep the appointment. The patient says that she does not know if her husband will come and get her. The patient is stressed over this.  Empathetic listening. Offered an urgent care guide referral for transportation for the patient but the patient declined.  The patient has decided to call her friend who helps her to see if she could be back up for transportation if her husband is not willing to help her.  Expressed to the patient the need to follow through with procedures and testing so the medical team can determine the best treatment plan for managing her large hernia that is growing. The patient did not go and have her barium swallow as she felt she was doing better and not it is not needed. The patient called today in distress asking the RNCM if she went to the hospital by 911 would they see that she needs surgery and allow her to have the surgery to repair the growing paraesophageal hernia. Explained to the patient if she was in respiratory distress  she should go and be evaluated at the hospital but the [facility] could not predict how they would treat the patient. The patient apologized for lying about going to have her barium swallow. 09-06-2019: The patient has put off further testing for Paraesophageal hernia. She states her husband had a "spell" and she had to look after him.  She says she is feeling good and things are stable. Education and support given.   Assessed patient understanding of disease states- the patient states the doctor at the hospital told her the hernia has gotten so large that medication will no longer help and surgical intervention needs to be considered. The patient is having a hard time with becoming short of breath at night when trying to sleep. She is propping herself up and doing the best she can. She did not qualify for home oxygen but feels she needs it, especially at night. This is an ongoing concern of the patient. The patient is having an acute exacerbation today and feels she needs to be evaluated in the ER. 09-06-2019: The patient states that her hernias are both stable right now and she is watching her diet and is thankful that she has not had any exacerbations. Does have follow ups scheduled to see specialist.   Secure chat sent to [place] letting the pcp know of the patient calling with concerns of exacerbation and recommendation of the patient going to be evaluated at  the ER. Completed  Assessed patient's education and care coordination needs  Provided disease specific education to patient- education on eating foods that are soft and easy to swallow, education on sitting straight up when eating to prevent choking, education on staying hydrated and monitor for s/s of dehydration. The patient is drinking "plenty of water", also she is drinking gatorade and Ensure at times. The patient states she is being careful when eating. The patient is managing her symptoms well during the day with positive changes in her  diet. 09-06-2019: Ongoing support and review.  Collaborated with appropriate clinical care team members regarding patient needs: Ongoing support from LCSW for stress, anxiety, and depression. Pharmacy consult for recommendations and help with clarification of omeprazole dosage and recommendations.  09-06-2019- The patient continues to work with LCSW and pharmacist. Knows the CCM team is here to help with her health and wellness needs.   Evaluation of patients concerns about upcoming testing and procedures scheduled. The patient feels that since she is managing her symptoms well and not having any flare ups right now that she is fine and does not need to move forward with surgical intervention. Encouraged the patient to talk to her medical team and told her that right now she may be feeling okay but what if she has a flare up and has to be hospitalized again. The patient is concerned over her husband not being supportive in her current health concerns. The patient is considering abandoning further testing at this time.  Encouraged the patient to really think about what she needs to do to maintain her health and well being and how long it took her to get referrals. The patient verbalized she will do what she needs to do. Thanked the [facility] for talking to her and listening to her. Will let pcp know RNCM and patient concerns. The patient did not go to scheduled testing and today is having exacerbation of symptoms related to anxiety and hernia. The patient was advised to be evaluated at the ER for further recommendations and treatment. 09-06-2019: The patient is stable at this time and denies the need for treatment for paraesophageal hernia at this time.   Patient Self Care Activities related to Anxiety, Depression, Bipolar Disorder, and Paraesophageal hernia :   Patient is unable to independently self-manage chronic health conditions  Please see past updates related to this goal by clicking on the "Past Updates"  button in the selected goal         Patient verbalizes understanding of instructions provided today.   The care management team will reach out to the patient again over the next 30 to 60 days.    Noreene Larsson RN, MSN, [place] Mobile: [phone]

## 2019-09-13 ENCOUNTER — Ambulatory Visit: Payer: Medicare HMO | Admitting: Family Medicine

## 2019-09-13 ENCOUNTER — Encounter: Payer: Self-pay | Admitting: Family Medicine

## 2019-09-13 ENCOUNTER — Other Ambulatory Visit: Payer: Self-pay

## 2019-09-13 ENCOUNTER — Ambulatory Visit (INDEPENDENT_AMBULATORY_CARE_PROVIDER_SITE_OTHER): Payer: Medicare HMO | Admitting: Family Medicine

## 2019-09-13 VITALS — BP 124/75 | HR 81 | Temp 98.3°F | Ht 66.0 in | Wt 166.0 lb

## 2019-09-13 DIAGNOSIS — Z79899 Other long term (current) drug therapy: Secondary | ICD-10-CM

## 2019-09-13 DIAGNOSIS — R635 Abnormal weight gain: Secondary | ICD-10-CM | POA: Diagnosis not present

## 2019-09-13 DIAGNOSIS — I10 Essential (primary) hypertension: Secondary | ICD-10-CM | POA: Diagnosis not present

## 2019-09-13 LAB — POCT URINALYSIS DIPSTICK
Bilirubin, UA: NEGATIVE
Blood, UA: NEGATIVE
Glucose, UA: NEGATIVE
Ketones, UA: NEGATIVE
Leukocytes, UA: NEGATIVE
Nitrite, UA: NEGATIVE
Protein, UA: NEGATIVE
Spec Grav, UA: <=1.005 — AB (ref 1.010–1.025)
Urobilinogen, UA: 0.2 E.U./dL
pH, UA: 6 (ref 5.0–8.0)

## 2019-09-13 MED ORDER — LISINOPRIL 10 MG PO TABS: 10.0000 mg | ORAL_TABLET | Freq: Every day | ORAL | 3 refills | Status: DC

## 2019-09-13 NOTE — Patient Instructions (Signed)
Continue on your lisinopril 10mg , this has managed your blood pressure to under 130/80, which is our goal.  Have your labs drawn today and we will contact you with the results.  Try to get exercise a minimum of 30 minutes per day at least 5 days per week as well as  adequate water intake all while measuring blood pressure a few times per week.  Keep a blood pressure log and bring back to clinic at your next visit.  If your readings are consistently over 130/80 to contact our office/send me a MyChart message and we will see you sooner.  Can try DASH and Mediterranean diet options, avoiding processed foods, lowering sodium intake, avoiding pork products, and eating a plant based diet for optimal health.  Education and discussion with patient regarding hypertension as well as the effects on the organs and body.  Specifically, we spoke about kidney disease, kidney failure, heart attack, stroke and up to and including death, as likely outcomes if non-compliant with blood pressure regulation.  Discussed how all of these habits are attached to each other and each has the effect on each other.  We will plan to see you back in 3 months for hypertension follow up  You will receive a survey after today's visit either digitally by e-mail or paper by USPS mail. Your experiences and feedback matter to .  Please respond so we know how we are doing as we provide care for you.  Call us with any questions/concerns/needs.  It is my goal to be available to you for your health concerns.  Thanks for choosing me to be a partner in your healthcare needs!  [place], FNP-C Family Nurse Practitioner [facility] Phone: [phone]

## 2019-09-13 NOTE — Assessment & Plan Note (Addendum)
Controlled hypertension.  BP is at goal < 130/80.  Pt is working on lifestyle modifications.  Taking medications tolerating well without side effects.  Complications; GERD, paraesophageal hernia   Plan: 1. Continue taking lisinopril 10mg  daily 2. Obtain labs today  3. Encouraged heart healthy diet and increasing exercise to 30 minutes most days of the week, going no more than 2 days in a row without exercise. 4. Check BP 1-2 x per week at home, keep log, and bring to clinic at next appointment. 5. Follow up 3 months.

## 2019-09-13 NOTE — Progress Notes (Signed)
Subjective:    Patient ID: Katrina Sutton, female    DOB: 09/12/1950, 69 y.o.   MRN: [id number]  Katrina Sutton is a 69 y.o. female presenting on 09/13/2019 for Hypertension (follow up )   HPI  Hypertension - She is checking BP at home or outside of clinic.  Readings all <130/80 upon home log review, typically 110-120/60-70 - Current medications: lisinopril 10mg  daily, tolerating well without side effects - She is not currently symptomatic. - Pt denies headache, lightheadedness, dizziness, changes in vision, chest tightness/pressure, palpitations, leg swelling, sudden loss of speech or loss of consciousness. - She  reports no regular exercise routine. - Her diet is high in salt, high in fat, and high in carbohydrates.  Depression screen [facility] 2/9 09/13/2019 08/30/2019 06/19/2019  Decreased Interest 0 0 0  Down, Depressed, Hopeless 0 1 0  PHQ - 2 Score 0 1 0  Altered sleeping 0 1 -  Tired, decreased energy 0 1 -  Change in appetite 0 0 -  Feeling bad or failure about yourself  0 1 -  Trouble concentrating 0 1 -  Moving slowly or fidgety/restless 0 0 -  Suicidal thoughts 0 0 -  PHQ-9 Score 0 5 -  Difficult doing work/chores Not difficult at all Not difficult at all -  Some recent data might be hidden    Social History   Tobacco Use  . Smoking status: Former Smoker    Quit date: 12/27/1976    Years since quitting: 42.7  . Smokeless tobacco: Never Used  Vaping Use  . Vaping Use: Never used  Substance Use Topics  . Alcohol use: No  . Drug use: No    Review of Systems  Constitutional: Negative.   HENT: Negative.   Eyes: Negative.   Respiratory: Negative.   Cardiovascular: Negative.   Gastrointestinal: Negative.   Endocrine: Negative.   Genitourinary: Negative.   Musculoskeletal: Negative.   Skin: Negative.   Allergic/Immunologic: Negative.   Neurological: Negative.   Hematological: Negative.   Psychiatric/Behavioral: Negative.    Per HPI unless specifically indicated  above     Objective:    BP 124/75   Pulse 81   Temp 98.3 F (36.8 C) (Oral)   Ht 5\' 6"  (1.676 m)   Wt 166 lb (75.3 kg)   SpO2 (!) 88%   BMI 26.79 kg/m   Wt Readings from Last 3 Encounters:  09/13/19 166 lb (75.3 kg)  08/30/19 171 lb 6.4 oz (77.7 kg)  07/04/19 173 lb (78.5 kg)    Physical Exam Vitals reviewed.  Constitutional:      General: She is not in acute distress.    Appearance: Normal appearance. She is well-developed, well-groomed and overweight. She is not ill-appearing or toxic-appearing.  HENT:     Head: Normocephalic.     Nose:     Comments: 09/01/19 is in place, covering mouth and nose  Eyes:     General: Lids are normal. Vision grossly intact.        Right eye: No discharge.        Left eye: No discharge.     Extraocular Movements: Extraocular movements intact.     Conjunctiva/sclera: Conjunctivae normal.     Pupils: Pupils are equal, round, and reactive to light.  Cardiovascular:     Rate and Rhythm: Normal rate and regular rhythm.     Pulses: Normal pulses.          Dorsalis pedis pulses are 2+ on the  right side and 2+ on the left side.     Heart sounds: Normal heart sounds. No murmur heard.  No friction rub. No gallop.   Pulmonary:     Effort: Pulmonary effort is normal. No respiratory distress.     Breath sounds: Normal breath sounds.  Musculoskeletal:     Right lower leg: No edema.     Left lower leg: No edema.  Feet:     Right foot:     Skin integrity: Skin integrity normal.     Left foot:     Skin integrity: Skin integrity normal.  Skin:    General: Skin is warm and dry.     Capillary Refill: Capillary refill takes less than 2 seconds.  Neurological:     General: No focal deficit present.     Mental Status: She is alert and oriented to person, place, and time.     Cranial Nerves: No cranial nerve deficit.     Sensory: No sensory deficit.     Motor: No weakness.     Coordination: Coordination normal.     Gait: Gait normal.   Psychiatric:        Attention and Perception: Attention and perception normal.        Mood and Affect: Mood and affect normal.        Speech: Speech normal.        Behavior: Behavior normal. Behavior is cooperative.        Thought Content: Thought content normal.        Cognition and Memory: Cognition and memory normal.        Judgment: Judgment normal.    Results for orders placed or performed during the hospital encounter of 05/08/19  Respiratory Panel by RT PCR (Flu A&B, Covid) - Nasopharyngeal Swab   Specimen: Nasopharyngeal Swab  Result Value Ref Range   SARS Coronavirus 2 by RT PCR NEGATIVE NEGATIVE   Influenza A by PCR NEGATIVE NEGATIVE   Influenza B by PCR NEGATIVE NEGATIVE  CBC  Result Value Ref Range   WBC 8.6 4.0 - 10.5 K/uL   RBC 5.07 3.87 - 5.11 MIL/uL   Hemoglobin 15.3 (H) 12.0 - 15.0 g/dL   HCT 48.5 36 - 46 %   MCV 88.4 80.0 - 100.0 fL   MCH 30.2 26.0 - 34.0 pg   MCHC 34.2 30.0 - 36.0 g/dL   RDW 46.2 70.3 - 50.0 %   Platelets 260 150 - 400 K/uL   nRBC 0.0 0.0 - 0.2 %  Basic metabolic panel  Result Value Ref Range   Sodium 137 135 - 145 mmol/L   Potassium 3.7 3.5 - 5.1 mmol/L   Chloride 101 98 - 111 mmol/L   CO2 25 22 - 32 mmol/L   Glucose, Bld 99 70 - 99 mg/dL   BUN 12 8 - 23 mg/dL   Creatinine, Ser 9.38 0.44 - 1.00 mg/dL   Calcium 9.4 8.9 - 18.2 mg/dL   GFR calc non Af Amer >60 >60 mL/min   GFR calc Af Amer >60 >60 mL/min   Anion gap 11 5 - 15  Brain natriuretic peptide  Result Value Ref Range   B Natriuretic Peptide 59.0 0.0 - 100.0 pg/mL  CBC  Result Value Ref Range   WBC 7.7 4.0 - 10.5 K/uL   RBC 4.78 3.87 - 5.11 MIL/uL   Hemoglobin 14.4 12.0 - 15.0 g/dL   HCT 99.3 36 - 46 %   MCV 90.4 80.0 - 100.0  fL   MCH 30.1 26.0 - 34.0 pg   MCHC 33.3 30.0 - 36.0 g/dL   RDW 16.1 09.6 - 04.5 %   Platelets 215 150 - 400 K/uL   nRBC 0.0 0.0 - 0.2 %  Basic metabolic panel  Result Value Ref Range   Sodium 142 135 - 145 mmol/L   Potassium 3.9 3.5 - 5.1  mmol/L   Chloride 105 98 - 111 mmol/L   CO2 29 22 - 32 mmol/L   Glucose, Bld 98 70 - 99 mg/dL   BUN 11 8 - 23 mg/dL   Creatinine, Ser 4.09 0.44 - 1.00 mg/dL   Calcium 9.2 8.9 - 81.1 mg/dL   GFR calc non Af Amer >60 >60 mL/min   GFR calc Af Amer >60 >60 mL/min   Anion gap 8 5 - 15  Blood gas, arterial  Result Value Ref Range   FIO2 0.21    Delivery systems ROOM AIR    pH, Arterial 7.38 7.35 - 7.45   pCO2 arterial 52 (H) 32 - 48 mmHg   pO2, Arterial 69 (L) 83 - 108 mmHg   Bicarbonate 30.8 (H) 20.0 - 28.0 mmol/L   Acid-Base Excess 4.4 (H) 0.0 - 2.0 mmol/L   O2 Saturation 93.2 %   Patient temperature 37.0    Collection site RIGHT RADIAL    Sample type ARTERIAL DRAW    Allens test (pass/fail) PASS PASS  ECHOCARDIOGRAM COMPLETE  Result Value Ref Range   Weight 2,737.23 oz   Height 66 in   BP 156/85 mmHg  Troponin I (High Sensitivity)  Result Value Ref Range   Troponin I (High Sensitivity) 4 <18 ng/L  Troponin I (High Sensitivity)  Result Value Ref Range   Troponin I (High Sensitivity) 4 <18 ng/L      Assessment & Plan:   Problem List Items Addressed This Visit      Cardiovascular and Mediastinum   Hypertension - Primary    Controlled hypertension.  BP is at goal < 130/80.  Pt is working on lifestyle modifications.  Taking medications tolerating well without side effects.  Complications; GERD, paraesophageal hernia   Plan: 1. Continue taking lisinopril 10mg  daily 2. Obtain labs today  3. Encouraged heart healthy diet and increasing exercise to 30 minutes most days of the week, going no more than 2 days in a row without exercise. 4. Check BP 1-2 x per week at home, keep log, and bring to clinic at next appointment. 5. Follow up 3 months.         Relevant Medications   lisinopril (ZESTRIL) 10 MG tablet   Other Relevant Orders   CBC with Differential   COMPLETE METABOLIC PANEL WITH GFR    Other Visit Diagnoses    Long-term use of high-risk medication        Relevant Orders   Lipid Profile   Thyroid Panel With TSH   Weight gain       Relevant Orders   Thyroid Panel With TSH      Meds ordered this encounter  Medications  . lisinopril (ZESTRIL) 10 MG tablet    Sig: Take 1 tablet (10 mg total) by mouth daily.    Dispense:  90 tablet    Refill:  3      Follow up plan: Return in about 3 months (around 12/14/2019) for HTN F/U.   02/13/2020, FNP Family Nurse Practitioner [facility] 09/13/2019, 8:32 AM

## 2019-09-13 NOTE — Addendum Note (Signed)
Addended by: Jerald Kief on: 09/13/2019 08:52 AM   Modules accepted: Orders

## 2019-09-14 LAB — COMPLETE METABOLIC PANEL WITH GFR
AG Ratio: 1.8 (calc) (ref 1.0–2.5)
ALT: 19 U/L (ref 6–29)
AST: 19 U/L (ref 10–35)
Albumin: 4.2 g/dL (ref 3.6–5.1)
Alkaline phosphatase (APISO): 134 U/L (ref 37–153)
BUN: 12 mg/dL (ref 7–25)
CO2: 27 mmol/L (ref 20–32)
Calcium: 9.7 mg/dL (ref 8.6–10.4)
Chloride: 105 mmol/L (ref 98–110)
Creat: 0.91 mg/dL (ref 0.60–0.93)
GFR, Est African American: 74 mL/min/{1.73_m2} (ref >=60)
GFR, Est Non African American: 63 mL/min/{1.73_m2} (ref >=60)
Globulin: 2.4 g/dL (calc) (ref 1.9–3.7)
Glucose, Bld: 96 mg/dL (ref 65–99)
Potassium: 4.3 mmol/L (ref 3.5–5.3)
Sodium: 141 mmol/L (ref 135–146)
Total Bilirubin: 1.1 mg/dL (ref 0.2–1.2)
Total Protein: 6.6 g/dL (ref 6.1–8.1)

## 2019-09-14 LAB — CBC WITH DIFFERENTIAL/PLATELET
Absolute Monocytes: 391 cells/uL (ref 200–950)
Basophils Absolute: 50 cells/uL (ref 0–200)
Basophils Relative: 0.8 %
Eosinophils Absolute: 81 cells/uL (ref 15–500)
Eosinophils Relative: 1.3 %
HCT: 46.1 % — ABNORMAL HIGH (ref 35.0–45.0)
Hemoglobin: 15.4 g/dL (ref 11.7–15.5)
Lymphs Abs: 1519 cells/uL (ref 850–3900)
MCH: 30.9 pg (ref 27.0–33.0)
MCHC: 33.4 g/dL (ref 32.0–36.0)
MCV: 92.6 fL (ref 80.0–100.0)
MPV: 9.6 fL (ref 7.5–12.5)
Monocytes Relative: 6.3 %
Neutro Abs: 4160 cells/uL (ref 1500–7800)
Neutrophils Relative %: 67.1 %
Platelets: 229 10*3/uL (ref 140–400)
RBC: 4.98 10*6/uL (ref 3.80–5.10)
RDW: 13.2 % (ref 11.0–15.0)
Total Lymphocyte: 24.5 %
WBC: 6.2 10*3/uL (ref 3.8–10.8)

## 2019-09-14 LAB — LIPID PANEL
Cholesterol: 197 mg/dL (ref <200)
HDL: 55 mg/dL (ref >=50)
LDL Cholesterol (Calc): 122 mg/dL (calc) — ABNORMAL HIGH
Non-HDL Cholesterol (Calc): 142 mg/dL (calc) — ABNORMAL HIGH (ref <130)
Total CHOL/HDL Ratio: 3.6 (calc) (ref <5.0)
Triglycerides: 98 mg/dL (ref <150)

## 2019-09-14 LAB — THYROID PANEL WITH TSH
Free Thyroxine Index: 3.2 (ref 1.4–3.8)
T3 Uptake: 29 % (ref 22–35)
T4, Total: 11 ug/dL (ref 5.1–11.9)
TSH: 1.76 mIU/L (ref 0.40–4.50)

## 2019-09-20 ENCOUNTER — Telehealth: Payer: Self-pay

## 2019-09-25 ENCOUNTER — Ambulatory Visit: Payer: Self-pay

## 2019-09-25 NOTE — Chronic Care Management (AMB) (Signed)
  Care Management   Follow Up Note   09/25/2019 Name: Katrina Sutton MRN: [id number] DOB: [birth year]  Referred by: Tarri Fuller, FNP Reason for referral : Care Coordination   Katrina Sutton is a 69 y.o. year old female who is a primary care patient of Tarri Fuller, FNP. The care management team was consulted for assistance with care management and care coordination needs.    Review of patient status, including review of consultants reports, relevant laboratory and other test results, and collaboration with appropriate care team members and the patient's provider was performed as part of comprehensive patient evaluation and provision of chronic care management services.    LCSW completed CCM outreach attempt today but was unable to reach patient successfully. A HIPPA compliant voice message was left encouraging patient to return call once available. LCSW rescheduled CCM SW appointment as well.  A HIPPA compliant phone message was left for the patient providing contact information and requesting a return call.   Dickie La, BSW, MSW, LCSW [facility] [place]  [facility] [email or web address] Phone: [phone]

## 2019-10-04 ENCOUNTER — Other Ambulatory Visit: Payer: Self-pay | Admitting: Family Medicine

## 2019-10-04 ENCOUNTER — Ambulatory Visit: Payer: Medicare HMO | Admitting: General Surgery

## 2019-10-04 DIAGNOSIS — F419 Anxiety disorder, unspecified: Secondary | ICD-10-CM

## 2019-10-04 DIAGNOSIS — J301 Allergic rhinitis due to pollen: Secondary | ICD-10-CM | POA: Diagnosis not present

## 2019-10-04 DIAGNOSIS — H6983 Other specified disorders of Eustachian tube, bilateral: Secondary | ICD-10-CM | POA: Diagnosis not present

## 2019-10-04 DIAGNOSIS — H90A31 Mixed conductive and sensorineural hearing loss, unilateral, right ear with restricted hearing on the contralateral side: Secondary | ICD-10-CM | POA: Diagnosis not present

## 2019-10-04 NOTE — Telephone Encounter (Signed)
[place]ally [place], 10/04/19.  According to PMP aware, pt last received a refill on 09/06/2019.  Refill of her controlled substance will be provided today.

## 2019-10-04 NOTE — Telephone Encounter (Signed)
Requested medication (s) are due for refill today:   Provider to decide  Requested medication (s) are on the active medication list:   Yes  Future visit scheduled:   Yes in 1 mo. With Joni Reining   Last ordered: Non delegated refill   Requested Prescriptions  Pending Prescriptions Disp Refills   clonazePAM (KLONOPIN) 1 MG tablet [Pharmacy Med Name: CLONAZEPAM 1 MG TAB] 60 tablet     Sig: TAKE 1 TABLET BY MOUTH TWICE A DAY AS NEEDED ANXIETY      Not Delegated - Psychiatry:  Anxiolytics/Hypnotics Failed - 10/04/2019 10:41 AM      Failed - This refill cannot be delegated      Failed - Urine Drug Screen completed in last 360 days.      Passed - Valid encounter within last 6 months    Recent Outpatient Visits           3 weeks ago Essential hypertension   [facility], Jodelle Gross, FNP   1 month ago Hypertension, unspecified type   [facility], Jodelle Gross, FNP   4 months ago Paraesophageal hernia   [facility], Jodelle Gross, FNP   4 months ago Leg swelling   [facility], Jodelle Gross, FNP   5 months ago Gastroesophageal reflux disease, unspecified whether esophagitis present   [facility], Jodelle Gross, FNP       Future Appointments             In 2 weeks Duanne Guess, MD [facility]   In 1 month [place], Jodelle Gross, FNP [facility]   In 2 months M[place]i, Jodelle Gross, FNP [facility]   In 8 months  [facility]

## 2019-10-11 ENCOUNTER — Emergency Department: Payer: Medicare HMO

## 2019-10-11 ENCOUNTER — Inpatient Hospital Stay
Admission: EM | Admit: 2019-10-11 | Discharge: 2019-10-18 | DRG: 392 | Disposition: A | Discharge status: Home / Self Care | Payer: Medicare HMO | Attending: Hospitalist | Admitting: Hospitalist

## 2019-10-11 ENCOUNTER — Other Ambulatory Visit: Payer: Self-pay

## 2019-10-11 DIAGNOSIS — N179 Acute kidney failure, unspecified: Secondary | ICD-10-CM | POA: Diagnosis present

## 2019-10-11 DIAGNOSIS — K56600 Partial intestinal obstruction, unspecified as to cause: Secondary | ICD-10-CM

## 2019-10-11 DIAGNOSIS — R140 Abdominal distension (gaseous): Secondary | ICD-10-CM

## 2019-10-11 DIAGNOSIS — I10 Essential (primary) hypertension: Secondary | ICD-10-CM | POA: Diagnosis not present

## 2019-10-11 DIAGNOSIS — R109 Unspecified abdominal pain: Secondary | ICD-10-CM | POA: Diagnosis not present

## 2019-10-11 DIAGNOSIS — K440 Diaphragmatic hernia with obstruction, without gangrene: Principal | ICD-10-CM | POA: Diagnosis present

## 2019-10-11 DIAGNOSIS — Z79899 Other long term (current) drug therapy: Secondary | ICD-10-CM | POA: Diagnosis not present

## 2019-10-11 DIAGNOSIS — R1084 Generalized abdominal pain: Secondary | ICD-10-CM

## 2019-10-11 DIAGNOSIS — Z888 Allergy status to other drugs, medicaments and biological substances status: Secondary | ICD-10-CM | POA: Diagnosis not present

## 2019-10-11 DIAGNOSIS — R0602 Shortness of breath: Secondary | ICD-10-CM | POA: Diagnosis not present

## 2019-10-11 DIAGNOSIS — Z87891 Personal history of nicotine dependence: Secondary | ICD-10-CM | POA: Diagnosis not present

## 2019-10-11 DIAGNOSIS — F411 Generalized anxiety disorder: Secondary | ICD-10-CM | POA: Diagnosis present

## 2019-10-11 DIAGNOSIS — K429 Umbilical hernia without obstruction or gangrene: Secondary | ICD-10-CM | POA: Diagnosis present

## 2019-10-11 DIAGNOSIS — K6389 Other specified diseases of intestine: Secondary | ICD-10-CM | POA: Diagnosis not present

## 2019-10-11 DIAGNOSIS — Z20822 Contact with and (suspected) exposure to covid-19: Secondary | ICD-10-CM | POA: Diagnosis not present

## 2019-10-11 DIAGNOSIS — Z818 Family history of other mental and behavioral disorders: Secondary | ICD-10-CM

## 2019-10-11 DIAGNOSIS — J986 Disorders of diaphragm: Secondary | ICD-10-CM | POA: Diagnosis not present

## 2019-10-11 DIAGNOSIS — R112 Nausea with vomiting, unspecified: Secondary | ICD-10-CM

## 2019-10-11 DIAGNOSIS — K219 Gastro-esophageal reflux disease without esophagitis: Secondary | ICD-10-CM | POA: Diagnosis present

## 2019-10-11 DIAGNOSIS — R0902 Hypoxemia: Secondary | ICD-10-CM | POA: Diagnosis not present

## 2019-10-11 DIAGNOSIS — K449 Diaphragmatic hernia without obstruction or gangrene: Secondary | ICD-10-CM

## 2019-10-11 DIAGNOSIS — I878 Other specified disorders of veins: Secondary | ICD-10-CM | POA: Diagnosis not present

## 2019-10-11 DIAGNOSIS — Z66 Do not resuscitate: Secondary | ICD-10-CM | POA: Diagnosis not present

## 2019-10-11 DIAGNOSIS — R52 Pain, unspecified: Secondary | ICD-10-CM | POA: Diagnosis not present

## 2019-10-11 DIAGNOSIS — K56609 Unspecified intestinal obstruction, unspecified as to partial versus complete obstruction: Secondary | ICD-10-CM

## 2019-10-11 DIAGNOSIS — F419 Anxiety disorder, unspecified: Secondary | ICD-10-CM | POA: Diagnosis not present

## 2019-10-11 DIAGNOSIS — F319 Bipolar disorder, unspecified: Secondary | ICD-10-CM | POA: Diagnosis not present

## 2019-10-11 DIAGNOSIS — F418 Other specified anxiety disorders: Secondary | ICD-10-CM | POA: Diagnosis not present

## 2019-10-11 DIAGNOSIS — Z881 Allergy status to other antibiotic agents status: Secondary | ICD-10-CM

## 2019-10-11 DIAGNOSIS — Z4682 Encounter for fitting and adjustment of non-vascular catheter: Secondary | ICD-10-CM | POA: Diagnosis not present

## 2019-10-11 DIAGNOSIS — J9811 Atelectasis: Secondary | ICD-10-CM | POA: Diagnosis not present

## 2019-10-11 DIAGNOSIS — E860 Dehydration: Secondary | ICD-10-CM | POA: Diagnosis not present

## 2019-10-11 DIAGNOSIS — K420 Umbilical hernia with obstruction, without gangrene: Secondary | ICD-10-CM | POA: Diagnosis not present

## 2019-10-11 DIAGNOSIS — Z9049 Acquired absence of other specified parts of digestive tract: Secondary | ICD-10-CM | POA: Diagnosis not present

## 2019-10-11 DIAGNOSIS — K5669 Other partial intestinal obstruction: Secondary | ICD-10-CM | POA: Diagnosis not present

## 2019-10-11 LAB — COMPREHENSIVE METABOLIC PANEL
ALT: 14 U/L (ref 0–44)
AST: 18 U/L (ref 15–41)
Albumin: 4.6 g/dL (ref 3.5–5.0)
Alkaline Phosphatase: 113 U/L (ref 38–126)
Anion gap: 11 (ref 5–15)
BUN: 10 mg/dL (ref 8–23)
CO2: 23 mmol/L (ref 22–32)
Calcium: 9.6 mg/dL (ref 8.9–10.3)
Chloride: 106 mmol/L (ref 98–111)
Creatinine, Ser: 0.9 mg/dL (ref 0.44–1.00)
GFR calc Af Amer: >60 mL/min (ref >60)
GFR calc non Af Amer: >60 mL/min (ref >60)
Glucose, Bld: 98 mg/dL (ref 70–99)
Potassium: 4 mmol/L (ref 3.5–5.1)
Sodium: 140 mmol/L (ref 135–145)
Total Bilirubin: 1.3 mg/dL — ABNORMAL HIGH (ref 0.3–1.2)
Total Protein: 7.6 g/dL (ref 6.5–8.1)

## 2019-10-11 LAB — CBC
HCT: 44.8 % (ref 36.0–46.0)
Hemoglobin: 15.8 g/dL — ABNORMAL HIGH (ref 12.0–15.0)
MCH: 31 pg (ref 26.0–34.0)
MCHC: 35.3 g/dL (ref 30.0–36.0)
MCV: 88 fL (ref 80.0–100.0)
Platelets: 266 10*3/uL (ref 150–400)
RBC: 5.09 MIL/uL (ref 3.87–5.11)
RDW: 13.1 % (ref 11.5–15.5)
WBC: 11.7 10*3/uL — ABNORMAL HIGH (ref 4.0–10.5)
nRBC: 0 % (ref 0.0–0.2)

## 2019-10-11 LAB — LIPASE, BLOOD: Lipase: 63 U/L — ABNORMAL HIGH (ref 11–51)

## 2019-10-11 MED ORDER — ONDANSETRON HCL 4 MG/2ML IJ SOLN
4.0000 mg | INTRAMUSCULAR | Status: AC
  Administered 2019-10-11: 4 mg via INTRAVENOUS
  Filled 2019-10-11: qty 2

## 2019-10-11 MED ORDER — SODIUM CHLORIDE 0.9% FLUSH: 3.0000 mL | Freq: Once | INTRAVENOUS | Status: DC

## 2019-10-11 NOTE — ED Provider Notes (Signed)
[facility] Emergency Department Provider Note  ____________________________________________   First MD Initiated Contact with Patient 10/11/19 2318     (approximate)  I have reviewed the triage vital signs and the nursing notes.   HISTORY  Chief Complaint Hernia and Abdominal Pain    HPI Katrina Sutton is a 69 y.o. female with medical history as listed below which notably includes an umbilical hernia for which she had hernia repair surgery 2 years ago by Dr. Lady Garyannon and who has already had a cholecystectomy.  She presents tonight for evaluation of intermittent central abdominal pain that has become constant and severe and is described as both sharp and aching.  It became more severe earlier tonight.  It is accompanied with nausea and vomiting which she still feels although the pain is considerably better at this time.  She is holding a bag with a moderate quantity of bilious emesis and notes that she has not been able to eat or drink  much over the last few days.  She is concerned that her hernia has reoccurred because she said that she noted a lump or mass and the same area but it is currently gone.  She has some pain in her upper abdomen as well.  No dysuria.  She has shortness of breath when she lies down at night but that has been going on for weeks if not months.  No chest pain.  She denies fever/chills and sore throat.  She has not had COVID-19 to her knowledge, and she has not been vaccinated.  She denies any alcohol use.        Past Medical History:  Diagnosis Date  . Allergy    seasonal  . Anxiety   . Arthritis    osteoarthritis  . Depression   . GERD (gastroesophageal reflux disease)   . Hiatal hernia     Patient Active Problem List   Diagnosis Date Noted  . Partial small bowel obstruction (HCC) 10/12/2019  . Hypertension 08/30/2019  . Leg swelling 05/08/2019  . Acute respiratory failure with hypoxia (HCC) 05/08/2019  . Paraesophageal hernia  05/08/2019  . Constipation 04/17/2019  . S/P umbilical hernia repair, follow-up exam 06/21/2018  . S/P laparoscopic cholecystectomy 05/31/2018  . Gallstone pancreatitis   . Umbilical hernia   . Dyspnea 03/11/2018  . Bipolar 1 disorder (HCC) 08/23/2017  . GERD (gastroesophageal reflux disease) 12/27/2016  . Arthritis 12/27/2016  . Anxiety 12/27/2016  . Depression with anxiety 12/27/2016  . Seasonal allergic rhinitis 12/27/2016    Past Surgical History:  Procedure Laterality Date  . CHOLECYSTECTOMY N/A 05/31/2018   Procedure: LAPAROSCOPIC CHOLECYSTECTOMY;  Surgeon: Duanne Guessannon, Jennifer, MD;  Location: [facility] ORS;  Service: General;  Laterality: N/A;  . TONSILLECTOMY    . UMBILICAL HERNIA REPAIR N/A 05/31/2018   Procedure: HERNIA REPAIR UMBILICAL ADULT;  Surgeon: Duanne Guessannon, Jennifer, MD;  Location: [facility] ORS;  Service: General;  Laterality: N/A;  . WRIST SURGERY Left     Prior to Admission medications   Medication Sig Start Date End Date Taking? Authorizing Provider  acetaminophen (TYLENOL) 500 MG tablet Take 500 mg by mouth every 8 (eight) hours as needed for headache.    [provider]  clonazePAM (KLONOPIN) 1 MG tablet TAKE 1 TABLET BY MOUTH TWICE A DAY AS NEEDED ANXIETY 10/04/19   Malfi, Jodelle GrossNicole M, FNP  hydrocortisone cream 1 % Apply 1 application topically 2 (two) times daily as needed for itching.     [provider]  linaclotide (  LINZESS) 145 MCG CAPS capsule Take 1 capsule (145 mcg total) by mouth daily before breakfast. 07/05/19 01/01/20  Wyline Mood, MD  lisinopril (ZESTRIL) 10 MG tablet Take 1 tablet (10 mg total) by mouth daily. 09/13/19   Malfi, Jodelle Gross, FNP  omeprazole (PRILOSEC) 20 MG capsule Take 1 capsule (20 mg total) by mouth daily. In evening 06/29/19   Malfi, Jodelle Gross, FNP  omeprazole (PRILOSEC) 40 MG capsule Take 1 capsule (40 mg total) by mouth daily. In morning 06/29/19   Malfi, Jodelle Gross, FNP  PARoxetine (PAXIL) 40 MG tablet TAKE 1 TABLET BY MOUTH DAILY 04/13/19    Karamalegos, Netta Neat, DO  polyethylene glycol (MIRALAX / GLYCOLAX) packet Take 17 g by mouth daily as needed.     [provider]  sodium chloride (OCEAN) 0.65 % SOLN nasal spray Place 1 spray into both nostrils as needed for congestion.    [provider]    Allergies Erythromycin and Duloxetine  Family History  Problem Relation Age of Onset  . Mental illness Mother   . Ulcers Father   . Stroke Neg Hx   . Heart attack Neg Hx   . Cancer Neg Hx     Social History Social History   Tobacco Use  . Smoking status: Former Smoker    Quit date: 12/27/1976    Years since quitting: 42.8  . Smokeless tobacco: Never Used  Vaping Use  . Vaping Use: Never used  Substance Use Topics  . Alcohol use: No  . Drug use: No    Review of Systems Constitutional: No fever/chills Eyes: No visual changes. ENT: No sore throat. Cardiovascular: Denies chest pain. Respiratory: Chronic shortness of breath particularly at night and when lying down.  No acute respiratory distress. Gastrointestinal: Abdominal pain for several days, now worse with nausea and vomiting.  Normal bowel movements. Genitourinary: Negative for dysuria. Musculoskeletal: Negative for neck pain.  Negative for back pain. Integumentary: Negative for rash. Neurological: Negative for headaches, focal weakness or numbness.   ____________________________________________   PHYSICAL EXAM:  VITAL SIGNS: ED Triage Vitals  Enc Vitals Group     BP 10/11/19 1723 (!) 140/87     Pulse Rate 10/11/19 1723 66     Resp 10/11/19 1723 18     Temp 10/11/19 1723 97.8 F (36.6 C)     Temp Source 10/11/19 1723 Oral     SpO2 10/11/19 1723 93 %     Weight 10/11/19 1724 74.8 kg (165 lb)     Height 10/11/19 1724 1.676 m (5\' 6" )     Head Circumference --      Peak Flow --      Pain Score 10/11/19 1724 8     Pain Loc --      Pain Edu? --      Excl. in GC? --     Constitutional: Alert and oriented.  Appears to have a  substantial degree of chronic illness and is nauseated currently but not in severe distress. Eyes: Conjunctivae are normal.  Head: Atraumatic. Nose: No congestion/rhinnorhea. Mouth/Throat: Patient is wearing a mask. Neck: No stridor.  No meningeal signs.   Cardiovascular: Normal rate, regular rhythm. Good peripheral circulation. Grossly normal heart sounds. Respiratory: Normal respiratory effort.  No retractions. Gastrointestinal: Obese.  Soft and nondistended.  Tender to palpation of the middle and upper abdomen but without rebound or guarding and the tenderness is mild at this time.  No palpable umbilical or ventral hernia. Musculoskeletal: No lower extremity tenderness  nor edema. No gross deformities of extremities. Neurologic:  Normal speech and language. No gross focal neurologic deficits are appreciated.  Skin:  Skin is warm, dry and intact. Psychiatric: Mood and affect are normal. Speech and behavior are normal.  ____________________________________________   LABS (all labs ordered are listed, but only abnormal results are displayed)  Labs Reviewed  LIPASE, BLOOD - Abnormal; Notable for the following components:      Result Value   Lipase 63 (*)    All other components within normal limits  COMPREHENSIVE METABOLIC PANEL - Abnormal; Notable for the following components:   Total Bilirubin 1.3 (*)    All other components within normal limits  CBC - Abnormal; Notable for the following components:   WBC 11.7 (*)    Hemoglobin 15.8 (*)    All other components within normal limits  SARS CORONAVIRUS 2 BY RT PCR (HOSPITAL ORDER, PERFORMED IN [facility])  URINALYSIS, COMPLETE (UACMP) WITH MICROSCOPIC  BASIC METABOLIC PANEL  CBC   ____________________________________________  EKG  No indication for emergent EKG ____________________________________________  RADIOLOGY I, Loleta Rose, personally viewed and evaluated these images (plain radiographs) as part of my  medical decision making, as well as reviewing the written report by the radiologist.  ED MD interpretation: Large but chronic paraesophageal hernia with most of the stomach and some bowel within her thorax.  This has been the case for at least months if not longer.  However additionally she has evidence of partial small bowel obstruction that appears acute.  Official radiology report(s): CT ABDOMEN PELVIS W CONTRAST  Result Date: 10/12/2019 CLINICAL DATA:  Nausea vomiting, question of pancreatitis EXAM: CT ABDOMEN AND PELVIS WITH CONTRAST TECHNIQUE: Multidetector CT imaging of the abdomen and pelvis was performed using the standard protocol following bolus administration of intravenous contrast. CONTRAST:  OMNIPAQUE IOHEXOL 300 MG/ML  SOLN COMPARISON:  None. FINDINGS: Lower chest: The visualized heart size within normal limits. No pericardial fluid/thickening. There is a very large right-sided paraesophageal hernia with the majority of the stomach contents herniated within the chest. There is also a small portion of the transverse colon. The visualized lung bases are clear. Hepatobiliary: The liver is normal in density without focal abnormality.The main portal vein is patent. The patient is status post cholecystectomy. No biliary ductal dilation. Pancreas: Unremarkable. No pancreatic ductal dilatation or surrounding inflammatory changes. Spleen: Normal in size without focal abnormality. Adrenals/Urinary Tract: Both adrenal glands appear normal. Multiple tiny low-density lesions seen within both kidneys. Bladder is unremarkable. Stomach/Bowel: Lung as noted above there is a again noted a large paraesophageal hernia with the entirety of the stomach herniated within the chest wall. There is also a small portion of the transverse colon within the hernia sac. There is mildly dilated loops of jejunum and ileum down to the level distal ileum/terminal ileum. A small portion of ileum appears to be within the  anterior abdominal wall hernia sac with a focal area of narrowing. Scattered colonic diverticula are noted. Vascular/Lymphatic: There are no enlarged mesenteric, retroperitoneal, or pelvic lymph nodes. Scattered aortic atherosclerotic calcifications are seen without aneurysmal dilatation. Reproductive: The uterus and adnexa are unremarkable. Other: There is a small anterior abdominal wall hernia containing a small amount of fluid and transverse colon. Musculoskeletal: No acute or significant osseous findings. IMPRESSION: Mildly dilated proximal small bowel to the level of the anterior abdominal wall hernia where there is a focal area of narrowing within the distal ileal loops, likely due to partial small bowel obstruction.  Again noted is a large paraesophageal hernia and a small portion of transverse colon seen within the hernia sac. Aortic Atherosclerosis (ICD10-I70.0). Electronically Signed   By: Jonna Clark M.D.   On: 10/12/2019 00:38   DG Chest Portable 1 View  Result Date: 10/11/2019 CLINICAL DATA:  Shortness of breath EXAM: PORTABLE CHEST 1 VIEW COMPARISON:  05/08/2019 radiograph and chest CT, CT 03/09/2018 FINDINGS: Large right-sided hernia containing air-filled bowel. Opacity at the right base probably reflects chronic atelectasis. Partially obscured cardiomediastinal silhouette with aortic atherosclerosis. No pleural effusion or pneumothorax. IMPRESSION: Large right-sided hernia containing air-filled bowel. Probable chronic atelectasis at the right base. Electronically Signed   By: Jasmine Pang M.D.   On: 10/11/2019 23:55    ____________________________________________   PROCEDURES   Procedure(s) performed (including Critical Care):  Procedures   ____________________________________________   INITIAL IMPRESSION / MDM / ASSESSMENT AND PLAN / ED COURSE  As part of my medical decision making, I reviewed the following data within the electronic MEDICAL RECORD NUMBER Nursing notes reviewed  and incorporated, Labs reviewed , EKG interpreted , Old chart reviewed, Radiograph reviewed , Discussed with surgeon (Dr. Maia Plan), Discussed with admitting physician (Dr. Antionette Char) and Notes from prior ED visits   Differential diagnosis includes, but is not limited to, umbilical hernia or ventral hernia complication, SBO/ileus, viral infection, pancreatitis, choledocholithiasis, appendicitis.  The lab work is notable for a lipase of 63 which is above the upper limit of normal but not substantially so.  Could be the result of her vomiting recently or could be indicative of pancreatitis.  Her T bili is slightly elevated at 1.3.  She is status post cholecystectomy with periodically she could have a stone in the common bile duct.  Very mild leukocytosis of 11.7.  She is hemodynamically stable with no fever and no tachycardia and she is normotensive.  She has no abdominal pain at this time but is still nauseated and just recently vomited.  I am giving her Zofran 4 mg IV.  Will obtain a CT abdomen pelvis with IV contrast to evaluate for the possibility of umbilical hernia complications, SBO/ileus, pancreatitis, etc.  She understands and agrees with the plan.  The patient is on the cardiac monitor to evaluate for evidence of arrhythmia and/or significant heart rate changes.     Clinical Course as of Oct 12 511  Fri Oct 12, 2019  0133 large paraesophageal hernia and a small portion of transverse colon seen within the hernia sac, but this seems chronic.  The patient also has what appears to be an acute partial bowel obstruction which definitely could explain her pain and nausea/vomiting.The patient is aware of the fact that her    [CF]  0136 Given the degree of paraesophageal hernia present in the thorax, I called and discussed the case by phone with Dr. Maia Plan with the surgery service.  He said there should be no issue by going ahead and putting in an NG tube.  He also agreed with the usual plan for  hospitalist admission.  I discussed with the patient and she understands and agrees and we are swabbing for Covid as a screening test and admitting her to the hospitalist.  She is having recurrence of her pain so I have ordered morphine 4 mg IV and Zofran 4 mg IV as well as an infusion rate of 100 mL/h of normal saline.   [CF]  0150 I discussed the case by phone with Dr. Antionette Char with the hospitalist service and he will  admit.   [CF]    Clinical Course User Index [CF] Loleta Rose, MD     ____________________________________________  FINAL CLINICAL IMPRESSION(S) / ED DIAGNOSES  Final diagnoses:  Partial small bowel obstruction (HCC)  Paraesophageal hernia  Nausea and vomiting, intractability of vomiting not specified, unspecified vomiting type  Generalized abdominal pain     MEDICATIONS GIVEN DURING THIS VISIT:  Medications  sodium chloride flush (NS) 0.9 % injection 3 mL (3 mLs Intravenous Not Given 10/11/19 2303)  0.45 % sodium chloride infusion ( Intravenous New Bag/Given 10/12/19 0249)  fentaNYL (SUBLIMAZE) injection 12.5-25 mcg (has no administration in time range)  ondansetron (ZOFRAN) tablet 4 mg (has no administration in time range)    Or  ondansetron (ZOFRAN) injection 4 mg (has no administration in time range)  pantoprazole (PROTONIX) injection 40 mg (40 mg Intravenous Given 10/12/19 0249)  ondansetron (ZOFRAN) injection 4 mg (4 mg Intravenous Given 10/11/19 2351)  iohexol (OMNIPAQUE) 300 MG/ML solution 100 mL (100 mLs Intravenous Contrast Given 10/12/19 0001)  morphine 4 MG/ML injection 4 mg (4 mg Intravenous Given 10/12/19 0148)  ondansetron (ZOFRAN) injection 4 mg (4 mg Intravenous Given 10/12/19 0148)  0.9 %  sodium chloride infusion ( Intravenous Stopped 10/12/19 0251)     ED Discharge Orders    None      *Please note:  Katrina Sutton was evaluated in Emergency Department on 10/12/2019 for the symptoms described in the history of present illness. She was evaluated in the  context of the global COVID-19 pandemic, which necessitated consideration that the patient might be at risk for infection with the SARS-CoV-2 virus that causes COVID-19. Institutional protocols and algorithms that pertain to the evaluation of patients at risk for COVID-19 are in a state of rapid change based on information released by regulatory bodies including the CDC and federal and state organizations. These policies and algorithms were followed during the patient's care in the ED.  Some ED evaluations and interventions may be delayed as a result of limited staffing during and after the pandemic.*  Note:  This document was prepared using Dragon voice recognition software and may include unintentional dictation errors.   Loleta Rose, MD 10/12/19 [id number]

## 2019-10-11 NOTE — ED Triage Notes (Signed)
Pt arrives via [facility] from home for reports of abdominal pain and umbilical hernia x 2 days. Pt reports she had the hernia operated on 2 years ago but it has come back. Pt in NAD, A&Ox4, skin warm and dry.

## 2019-10-11 NOTE — ED Triage Notes (Signed)
First RN Note: Pt presents to ED via [facility] with c/o 9/10 abdominal pain at the umbilicus x several days. Per EMS VSS en route.

## 2019-10-12 ENCOUNTER — Inpatient Hospital Stay: Payer: Medicare HMO

## 2019-10-12 ENCOUNTER — Telehealth: Payer: Self-pay | Admitting: Family Medicine

## 2019-10-12 ENCOUNTER — Other Ambulatory Visit: Payer: Self-pay

## 2019-10-12 DIAGNOSIS — K449 Diaphragmatic hernia without obstruction or gangrene: Secondary | ICD-10-CM | POA: Diagnosis present

## 2019-10-12 DIAGNOSIS — Z20822 Contact with and (suspected) exposure to covid-19: Secondary | ICD-10-CM | POA: Diagnosis present

## 2019-10-12 DIAGNOSIS — F418 Other specified anxiety disorders: Secondary | ICD-10-CM | POA: Diagnosis not present

## 2019-10-12 DIAGNOSIS — K429 Umbilical hernia without obstruction or gangrene: Secondary | ICD-10-CM | POA: Diagnosis present

## 2019-10-12 DIAGNOSIS — F319 Bipolar disorder, unspecified: Secondary | ICD-10-CM | POA: Diagnosis present

## 2019-10-12 DIAGNOSIS — Z818 Family history of other mental and behavioral disorders: Secondary | ICD-10-CM | POA: Diagnosis not present

## 2019-10-12 DIAGNOSIS — K56609 Unspecified intestinal obstruction, unspecified as to partial versus complete obstruction: Secondary | ICD-10-CM | POA: Diagnosis not present

## 2019-10-12 DIAGNOSIS — K56600 Partial intestinal obstruction, unspecified as to cause: Secondary | ICD-10-CM | POA: Diagnosis not present

## 2019-10-12 DIAGNOSIS — K440 Diaphragmatic hernia with obstruction, without gangrene: Secondary | ICD-10-CM | POA: Diagnosis present

## 2019-10-12 DIAGNOSIS — N179 Acute kidney failure, unspecified: Secondary | ICD-10-CM | POA: Diagnosis present

## 2019-10-12 DIAGNOSIS — K420 Umbilical hernia with obstruction, without gangrene: Secondary | ICD-10-CM | POA: Diagnosis not present

## 2019-10-12 DIAGNOSIS — E860 Dehydration: Secondary | ICD-10-CM | POA: Diagnosis present

## 2019-10-12 DIAGNOSIS — Z66 Do not resuscitate: Secondary | ICD-10-CM | POA: Diagnosis present

## 2019-10-12 DIAGNOSIS — Z881 Allergy status to other antibiotic agents status: Secondary | ICD-10-CM | POA: Diagnosis not present

## 2019-10-12 DIAGNOSIS — Z888 Allergy status to other drugs, medicaments and biological substances status: Secondary | ICD-10-CM | POA: Diagnosis not present

## 2019-10-12 DIAGNOSIS — I10 Essential (primary) hypertension: Secondary | ICD-10-CM | POA: Diagnosis present

## 2019-10-12 DIAGNOSIS — Z79899 Other long term (current) drug therapy: Secondary | ICD-10-CM | POA: Diagnosis not present

## 2019-10-12 DIAGNOSIS — Z87891 Personal history of nicotine dependence: Secondary | ICD-10-CM | POA: Diagnosis not present

## 2019-10-12 DIAGNOSIS — F419 Anxiety disorder, unspecified: Secondary | ICD-10-CM | POA: Diagnosis present

## 2019-10-12 DIAGNOSIS — K219 Gastro-esophageal reflux disease without esophagitis: Secondary | ICD-10-CM | POA: Diagnosis present

## 2019-10-12 LAB — SARS CORONAVIRUS 2 BY RT PCR (HOSPITAL ORDER, PERFORMED IN ~~LOC~~ HOSPITAL LAB): SARS Coronavirus 2: NEGATIVE

## 2019-10-12 LAB — BASIC METABOLIC PANEL
Anion gap: 12 (ref 5–15)
BUN: 18 mg/dL (ref 8–23)
CO2: 24 mmol/L (ref 22–32)
Calcium: 9.7 mg/dL (ref 8.9–10.3)
Chloride: 102 mmol/L (ref 98–111)
Creatinine, Ser: 1.79 mg/dL — ABNORMAL HIGH (ref 0.44–1.00)
GFR calc Af Amer: 32 mL/min — ABNORMAL LOW (ref >60)
GFR calc non Af Amer: 28 mL/min — ABNORMAL LOW (ref >60)
Glucose, Bld: 136 mg/dL — ABNORMAL HIGH (ref 70–99)
Potassium: 4.3 mmol/L (ref 3.5–5.1)
Sodium: 138 mmol/L (ref 135–145)

## 2019-10-12 LAB — CBC
HCT: 48.3 % — ABNORMAL HIGH (ref 36.0–46.0)
Hemoglobin: 16.5 g/dL — ABNORMAL HIGH (ref 12.0–15.0)
MCH: 31 pg (ref 26.0–34.0)
MCHC: 34.2 g/dL (ref 30.0–36.0)
MCV: 90.8 fL (ref 80.0–100.0)
Platelets: 285 10*3/uL (ref 150–400)
RBC: 5.32 MIL/uL — ABNORMAL HIGH (ref 3.87–5.11)
RDW: 13.3 % (ref 11.5–15.5)
WBC: 18.4 10*3/uL — ABNORMAL HIGH (ref 4.0–10.5)
nRBC: 0 % (ref 0.0–0.2)

## 2019-10-12 MED ORDER — CLONAZEPAM 0.25 MG PO TBDP
0.2500 mg | ORAL_TABLET | Freq: Two times a day (BID) | ORAL | Status: DC | PRN
  Administered 2019-10-13 – 2019-10-16 (×7): 0.25 mg via ORAL
  Filled 2019-10-12 (×8): qty 1

## 2019-10-12 MED ORDER — MORPHINE SULFATE (PF) 4 MG/ML IV SOLN
4.0000 mg | Freq: Once | INTRAVENOUS | Status: AC
  Administered 2019-10-12: 4 mg via INTRAVENOUS
  Filled 2019-10-12: qty 1

## 2019-10-12 MED ORDER — SODIUM CHLORIDE 0.9 % IV SOLN: Freq: Once | INTRAVENOUS | Status: AC

## 2019-10-12 MED ORDER — SODIUM CHLORIDE 0.45 % IV SOLN: INTRAVENOUS | Status: DC

## 2019-10-12 MED ORDER — FENTANYL CITRATE (PF) 100 MCG/2ML IJ SOLN: 12.5000 ug | INTRAMUSCULAR | Status: DC | PRN

## 2019-10-12 MED ORDER — SALINE SPRAY 0.65 % NA SOLN
1.0000 | NASAL | Status: DC | PRN
  Filled 2019-10-12: qty 44

## 2019-10-12 MED ORDER — ONDANSETRON HCL 4 MG/2ML IJ SOLN
4.0000 mg | Freq: Four times a day (QID) | INTRAMUSCULAR | Status: DC | PRN
  Administered 2019-10-16 (×2): 4 mg via INTRAVENOUS
  Filled 2019-10-12 (×2): qty 2

## 2019-10-12 MED ORDER — ONDANSETRON HCL 4 MG/2ML IJ SOLN
4.0000 mg | INTRAMUSCULAR | Status: AC
  Administered 2019-10-12: 4 mg via INTRAVENOUS
  Filled 2019-10-12: qty 2

## 2019-10-12 MED ORDER — PANTOPRAZOLE SODIUM 40 MG IV SOLR
40.0000 mg | INTRAVENOUS | Status: DC
  Administered 2019-10-12 – 2019-10-17 (×6): 40 mg via INTRAVENOUS
  Filled 2019-10-12 (×6): qty 40

## 2019-10-12 MED ORDER — PAROXETINE HCL 20 MG PO TABS
40.0000 mg | ORAL_TABLET | Freq: Every day | ORAL | Status: DC
  Administered 2019-10-12 – 2019-10-18 (×6): 40 mg via ORAL
  Filled 2019-10-12 (×8): qty 2

## 2019-10-12 MED ORDER — LORAZEPAM 2 MG/ML IJ SOLN
0.5000 mg | Freq: Once | INTRAMUSCULAR | Status: AC
  Administered 2019-10-12: 0.5 mg via INTRAVENOUS
  Filled 2019-10-12: qty 1

## 2019-10-12 MED ORDER — IOHEXOL 300 MG/ML  SOLN
100.0000 mL | Freq: Once | INTRAMUSCULAR | Status: AC | PRN
  Administered 2019-10-12: 100 mL via INTRAVENOUS

## 2019-10-12 MED ORDER — ONDANSETRON HCL 4 MG PO TABS: 4.0000 mg | ORAL_TABLET | Freq: Four times a day (QID) | ORAL | Status: DC | PRN

## 2019-10-12 NOTE — ED Notes (Signed)
Pt remains resting in bed. NAD reported. Pt states she was able to sleep "about 2 hours." No needs verbalized, bed locked and in lowest position, call bell in reach and lights dimmed.

## 2019-10-12 NOTE — Consult Note (Signed)
SURGICAL CONSULTATION NOTE   HISTORY OF PRESENT ILLNESS (HPI):  69 y.o. female presented to [facility] ED for evaluation of abdominal pain, nausea and vomiting. Patient reports starting with abdominal pain few days ago but intensified yesterday. Patient reports pain is on the center of the abdomen. Pain does not radiates to other part of the body. There has been no alleviating or aggravating factor. Patient denies passing gas. Cannot recall last bowel movement.   At the ED she was found with leukocytosis and elevated hemoglobin consisted with dehydration. CT scan of the abdomen shows small bowel dilation with a large paraesophageal hernia and recurrent umbilical hernia. I personally evaluated the images.   Surgery is consulted by Dr. York CeriseForbach in this context for evaluation and management of small bowel obstruction.  PAST MEDICAL HISTORY (PMH):  Past Medical History:  Diagnosis Date  . Allergy    seasonal  . Anxiety   . Arthritis    osteoarthritis  . Depression   . GERD (gastroesophageal reflux disease)   . Hiatal hernia      PAST SURGICAL HISTORY (PSH):  Past Surgical History:  Procedure Laterality Date  . CHOLECYSTECTOMY N/A 05/31/2018   Procedure: LAPAROSCOPIC CHOLECYSTECTOMY;  Surgeon: Duanne Guessannon, Jennifer, MD;  Location: [facility] ORS;  Service: General;  Laterality: N/A;  . TONSILLECTOMY    . UMBILICAL HERNIA REPAIR N/A 05/31/2018   Procedure: HERNIA REPAIR UMBILICAL ADULT;  Surgeon: Duanne Guessannon, Jennifer, MD;  Location: [facility] ORS;  Service: General;  Laterality: N/A;  . WRIST SURGERY Left      MEDICATIONS:  Prior to Admission medications   Medication Sig Start Date End Date Taking? Authorizing Provider  acetaminophen (TYLENOL) 500 MG tablet Take 500 mg by mouth every 8 (eight) hours as needed for headache.   Yes [provider]  clonazePAM (KLONOPIN) 1 MG tablet TAKE 1 TABLET BY MOUTH TWICE A DAY AS NEEDED ANXIETY 10/04/19  Yes Malfi, Jodelle GrossNicole M, FNP  fluticasone (FLONASE) 50 MCG/ACT  nasal spray Place 1 spray into both nostrils daily as needed. 10/04/19  Yes [provider]  hydrocortisone cream 1 % Apply 1 application topically 2 (two) times daily as needed for itching.    Yes [provider]  linaclotide Karlene Einstein(LINZESS) 145 MCG CAPS capsule Take 1 capsule (145 mcg total) by mouth daily before breakfast. 07/05/19 01/01/20 Yes Wyline MoodAnna, Kiran, MD  lisinopril (ZESTRIL) 10 MG tablet Take 1 tablet (10 mg total) by mouth daily. 09/13/19  Yes Malfi, Jodelle GrossNicole M, FNP  omeprazole (PRILOSEC) 20 MG capsule Take 1 capsule (20 mg total) by mouth daily. In evening 06/29/19  Yes Malfi, Jodelle GrossNicole M, FNP  omeprazole (PRILOSEC) 40 MG capsule Take 1 capsule (40 mg total) by mouth daily. In morning 06/29/19  Yes Malfi, Jodelle GrossNicole M, FNP  PARoxetine (PAXIL) 40 MG tablet TAKE 1 TABLET BY MOUTH DAILY 04/13/19  Yes Karamalegos, Netta NeatAlexander J, DO  polyethylene glycol (MIRALAX / GLYCOLAX) packet Take 17 g by mouth daily as needed.    Yes [provider]  sodium chloride (OCEAN) 0.65 % SOLN nasal spray Place 1 spray into both nostrils as needed for congestion.   Yes [provider]     ALLERGIES:  Allergies  Allergen Reactions  . Erythromycin Shortness Of Breath and Rash  . Duloxetine Palpitations     SOCIAL HISTORY:  Social History   Socioeconomic History  . Marital status: Married    Spouse name: Not on file  . Number of children: Not on file  . Years of education: Not on  file  . Highest education level: Not on file  Occupational History  . Not on file  Tobacco Use  . Smoking status: Former Smoker    Quit date: 12/27/1976    Years since quitting: 42.8  . Smokeless tobacco: Never Used  Vaping Use  . Vaping Use: Never used  Substance and Sexual Activity  . Alcohol use: No  . Drug use: No  . Sexual activity: Never    Birth control/protection: None  Other Topics Concern  . Not on file  Social History Narrative  . Not on file   Social Determinants of Health   Financial  Resource Strain: Low Risk   . Difficulty of Paying Living Expenses: Not hard at all  Food Insecurity: No Food Insecurity  . Worried About Programme researcher, broadcasting/film/video in the Last Year: Never true  . Ran Out of Food in the Last Year: Never true  Transportation Needs: No Transportation Needs  . Lack of Transportation (Medical): No  . Lack of Transportation (Non-Medical): No  Physical Activity: Insufficiently Active  . Days of Exercise per Week: 3 days  . Minutes of Exercise per Session: 30 min  Stress: Stress Concern Present  . Feeling of Stress : To some extent  Social Connections: Moderately Isolated  . Frequency of Communication with Friends and Family: More than three times a week  . Frequency of Social Gatherings with Friends and Family: More than three times a week  . Attends Religious Services: Never  . Active Member of Clubs or Organizations: No  . Attends Banker Meetings: Never  . Marital Status: Married  Catering manager Violence: Not At Risk  . Fear of Current or Ex-Partner: No  . Emotionally Abused: No  . Physically Abused: No  . Sexually Abused: No      FAMILY HISTORY:  Family History  Problem Relation Age of Onset  . Mental illness Mother   . Ulcers Father   . Stroke Neg Hx   . Heart attack Neg Hx   . Cancer Neg Hx      REVIEW OF SYSTEMS:  Constitutional: denies weight loss, fever, chills, or sweats  Eyes: denies any other vision changes, history of eye injury  ENT: denies sore throat, hearing problems  Respiratory: denies shortness of breath, wheezing  Cardiovascular: denies chest pain, palpitations  Gastrointestinal: positive abdominal pain, nausea and vomiting Genitourinary: denies burning with urination or urinary frequency Musculoskeletal: denies any other joint pains or cramps  Skin: denies any other rashes or skin discolorations  Neurological: denies any other headache, dizziness, weakness  Psychiatric: denies any other depression, anxiety    All other review of systems were negative   VITAL SIGNS:  Temp:  [97.8 F (36.6 C)] 97.8 F (36.6 C) (07/29 1723) Pulse Rate:  [66-96] 96 (07/30 1435) Resp:  [16-20] 18 (07/30 1435) BP: (93-140)/(68-93) 134/89 (07/30 1435) SpO2:  [85 %-99 %] 93 % (07/30 1435) Weight:  [74.8 kg] 74.8 kg (07/29 1724)     Height: 5\' 6"  (167.6 cm) Weight: 74.8 kg BMI (Calculated): 26.64   INTAKE/OUTPUT:  This shift: No intake/output data recorded.  Last 2 shifts: @IOLAST2SHIFTS @   PHYSICAL EXAM:  Constitutional:  -- Normal body habitus  -- Awake, alert, and oriented x3  Eyes:  -- Pupils equally round and reactive to light  -- No scleral icterus  Ear, nose, and throat:  -- No jugular venous distension  Pulmonary:  -- No crackles  -- Equal breath sounds bilaterally -- Breathing non-labored  at rest Cardiovascular:  -- S1, S2 present  -- No pericardial rubs Gastrointestinal:  -- Abdomen soft, mild, distended, tympanic, no guarding or rebound tenderness -- Unable to palpate hernia defect, not able to be reduced.   Musculoskeletal and Integumentary:  -- Wounds: None appreciated -- Extremities: B/L UE and LE FROM, hands and feet warm, no edema  Neurologic:  -- Motor function: intact and symmetric -- Sensation: intact and symmetric   Labs:  CBC Latest Ref Rng & Units 10/12/2019 10/11/2019 09/13/2019  WBC 4.0 - 10.5 K/uL 18.4(H) 11.7(H) 6.2  Hemoglobin 12.0 - 15.0 g/dL 16.5(H) 15.8(H) 15.4  Hematocrit 36 - 46 % 48.3(H) 44.8 46.1(H)  Platelets 150 - 400 K/uL 285 266 229   CMP Latest Ref Rng & Units 10/12/2019 10/11/2019 09/13/2019  Glucose 70 - 99 mg/dL 741(O) 98 96  BUN 8 - 23 mg/dL 18 10 12   Creatinine 0.44 - 1.00 mg/dL ) 8.78(M 7.67  Sodium 135 - 145 mmol/L 138 140 141  Potassium 3.5 - 5.1 mmol/L 4.3 4.0 4.3  Chloride 98 - 111 mmol/L 102 106 105  CO2 22 - 32 mmol/L 24 23 27   Calcium 8.9 - 10.3 mg/dL 9.7 9.6 9.7  Total Protein 6.5 - 8.1 g/dL - 7.6 6.6  Total Bilirubin 0.3 - 1.2 mg/dL -  1.3(H) 1.1  Alkaline Phos 38 - 126 U/L - 113 -  AST 15 - 41 U/L - 18 19  ALT 0 - 44 U/L - 14 19    Imaging studies:  EXAM: CT ABDOMEN AND PELVIS WITH CONTRAST  TECHNIQUE: Multidetector CT imaging of the abdomen and pelvis was performed using the standard protocol following bolus administration of intravenous contrast.  CONTRAST:  2.09 OMNIPAQUE IOHEXOL 300 MG/ML  SOLN  COMPARISON:  None.  FINDINGS: Lower chest: The visualized heart size within normal limits. No pericardial fluid/thickening.  There is a very large right-sided paraesophageal hernia with the majority of the stomach contents herniated within the chest. There is also a small portion of the transverse colon. The visualized lung bases are clear.  Hepatobiliary: The liver is normal in density without focal abnormality.The main portal vein is patent. The patient is status post cholecystectomy. No biliary ductal dilation.  Pancreas: Unremarkable. No pancreatic ductal dilatation or surrounding inflammatory changes.  Spleen: Normal in size without focal abnormality.  Adrenals/Urinary Tract: Both adrenal glands appear normal. Multiple tiny low-density lesions seen within both kidneys. Bladder is unremarkable.  Stomach/Bowel: Lung as noted above there is a again noted a large paraesophageal hernia with the entirety of the stomach herniated within the chest wall. There is also a small portion of the transverse colon within the hernia sac. There is mildly dilated loops of jejunum and ileum down to the level distal ileum/terminal ileum. A small portion of ileum appears to be within the anterior abdominal wall hernia sac with a focal area of narrowing. Scattered colonic diverticula are noted.  Vascular/Lymphatic: There are no enlarged mesenteric, retroperitoneal, or pelvic lymph nodes. Scattered aortic atherosclerotic calcifications are seen without aneurysmal dilatation.  Reproductive: The uterus  and adnexa are unremarkable.  Other: There is a small anterior abdominal wall hernia containing a small amount of fluid and transverse colon.  Musculoskeletal: No acute or significant osseous findings.  IMPRESSION: Mildly dilated proximal small bowel to the level of the anterior abdominal wall hernia where there is a focal area of narrowing within the distal ileal loops, likely due to partial small bowel obstruction.  Again noted is a large paraesophageal  hernia and a small portion of transverse colon seen within the hernia sac.  Aortic Atherosclerosis (ICD10-I70.0).   Electronically Signed   By: Jonna Clark M.D.   On: 10/12/2019 00:38  Assessment/Plan:  69 y.o. female with small bowel obstruction, complicated by pertinent comorbidities including hypertension, Bipolar 1 disorder.  Patient with history, physical exam and images consistent with small bowel obstruction. NGT was placed by nurse. Patient still feeling bloated and with abdominal pain. I agree to continue medical management of small bowel obstruction. Will continue to follow closely to decide if she will need surgery for the umbilical hernia if the obstruction does not resolve. Patient with labs with elevated hemoglobin and creatinine consisted with dehydration and acute kidney injury. Will continue IV hydration and follow closely with serial abdominal exam.   Gae Gallop, MD

## 2019-10-12 NOTE — ED Notes (Signed)
Pt states that her oxygen level drops at night but states she isn't on bipap. Patient placed on 2 L of oxygen for comfort.

## 2019-10-12 NOTE — ED Notes (Signed)
Patients NG tube clamped to trial with PO meds

## 2019-10-12 NOTE — Progress Notes (Addendum)
Same day rounding progress note  Patient seen and examined while in the ED, waiting for the floor bed.  Seems comfortable, no new complaints.  Requesting her Klonopin and Prilosec.  1. Partial SBO  - Presents with periumbilical pain and N/V and has CT findings suggestive of ventral hernia with partial SBO in addition to the known large paraesophageal hernia  - ED physician reviewed scan with surgery and medical admission was recommended  - NGT was placed in ED  - Continue NGT decompression, pain-control, maintenance IVF -nursing can stop intermittent suction for oral meds as needed - KUB tomorrow to see improvement -Surgery consult pending  2. Paraesophageal hernia  - Patient reports undergoing outpatient evaluation for possible repair but not sure that she wants to proceed with that  - Continue PPI  IV  3. Depression, anxiety  - Restart Klonopin and Paxil  4. Hypertension  - Stable  I have updated patient's husband Hildur Bayer over phone -[phone]   Time spent: 20 minutes  Possible discharge in 2 to 3 days depending on clinical improvement in partial SBO.

## 2019-10-12 NOTE — ED Notes (Signed)
Pt sleeping at this time and husband updated that she will call after she wakes up. Husband verbalized understanding.

## 2019-10-12 NOTE — ED Notes (Signed)
Patient feeling nauseas at this time since clamping NG tube. MD Sherryll Burger made aware

## 2019-10-12 NOTE — ED Notes (Signed)
Pt transported to CT ?

## 2019-10-12 NOTE — Telephone Encounter (Signed)
Patient's husband  is calling to ask if Joni Reining can can call and check on the patient in the ED. Please advise [phone]

## 2019-10-12 NOTE — H&P (Signed)
History and Physical    Katrina Sutton:[id number] DOB: 05/31/1950 DOA: 10/11/2019  PCP: Tarri FullerMalfi, Nicole M, FNP   Patient coming from: Home   Chief Complaint: Abdominal pain, N/V   HPI: Katrina Sutton is a 69 y.o. female who reports a medical history significant for large paraesophageal hernia, ventral hernia status post repair 2 years ago, hypertension, depression, and anxiety, presenting to emergency department with several days of periumbilical abdominal pain, nausea, and vomiting.  Patient reports that she had a ventral hernia repair approximately 2 years ago but reports that the hernia returned.  She has some pain around this area, but no discoloration and no fevers or chills.  She reports chronic nocturnal hypoxia but denies any recent shortness of breath or cough.  She denies hematemesis, melena, or hematochezia.  ED Course: Upon arrival to the ED, patient is found to be afebrile, saturating mid 80s to mid 90s on room air, and with blood pressure 93/70.  Chest x-ray demonstrates large right-sided hernia containing air filled bowel.  CT the abdomen and pelvis notable for mildly dilated proximal small bowel, ventral hernia, and likely partial SBO.  Chemistry panel with slight hyperbilirubinemia.  CBC notable for a mild leukocytosis.  Lipase is mildly elevated.  General surgery was consulted by the ED physician and a medical admission was recommended.  NG tube was placed and the patient was given IV fluids, morphine, and Zofran.  Review of Systems:  All other systems reviewed and apart from HPI, are negative.  Past Medical History:  Diagnosis Date  . Allergy    seasonal  . Anxiety   . Arthritis    osteoarthritis  . Depression   . GERD (gastroesophageal reflux disease)   . Hiatal hernia     Past Surgical History:  Procedure Laterality Date  . CHOLECYSTECTOMY N/A 05/31/2018   Procedure: LAPAROSCOPIC CHOLECYSTECTOMY;  Surgeon: Duanne Guessannon, Jennifer, MD;  Location: [facility] ORS;  Service: General;   Laterality: N/A;  . TONSILLECTOMY    . UMBILICAL HERNIA REPAIR N/A 05/31/2018   Procedure: HERNIA REPAIR UMBILICAL ADULT;  Surgeon: Duanne Guessannon, Jennifer, MD;  Location: [facility] ORS;  Service: General;  Laterality: N/A;  . WRIST SURGERY Left     Social History:   reports that she quit smoking about 42 years ago. She has never used smokeless tobacco. She reports that she does not drink alcohol and does not use drugs.  Allergies  Allergen Reactions  . Erythromycin Shortness Of Breath and Rash  . Duloxetine Palpitations    Family History  Problem Relation Age of Onset  . Mental illness Mother   . Ulcers Father   . Stroke Neg Hx   . Heart attack Neg Hx   . Cancer Neg Hx      Prior to Admission medications   Medication Sig Start Date End Date Taking? Authorizing Provider  acetaminophen (TYLENOL) 500 MG tablet Take 500 mg by mouth every 8 (eight) hours as needed for headache.    [provider]  clonazePAM (KLONOPIN) 1 MG tablet TAKE 1 TABLET BY MOUTH TWICE A DAY AS NEEDED ANXIETY 10/04/19   Malfi, Jodelle GrossNicole M, FNP  hydrocortisone cream 1 % Apply 1 application topically 2 (two) times daily as needed for itching.     [provider]  linaclotide Karlene Einstein(LINZESS) 145 MCG CAPS capsule Take 1 capsule (145 mcg total) by mouth daily before breakfast. 07/05/19 01/01/20  Wyline MoodAnna, Kiran, MD  lisinopril (ZESTRIL) 10 MG tablet Take 1 tablet (10 mg total) by mouth daily.  09/13/19   Malfi, Jodelle Gross, FNP  omeprazole (PRILOSEC) 20 MG capsule Take 1 capsule (20 mg total) by mouth daily. In evening 06/29/19   Malfi, Jodelle Gross, FNP  omeprazole (PRILOSEC) 40 MG capsule Take 1 capsule (40 mg total) by mouth daily. In morning 06/29/19   Malfi, Jodelle Gross, FNP  PARoxetine (PAXIL) 40 MG tablet TAKE 1 TABLET BY MOUTH DAILY 04/13/19   Karamalegos, Netta Neat, DO  polyethylene glycol (MIRALAX / GLYCOLAX) packet Take 17 g by mouth daily as needed.     [provider]  sodium chloride (OCEAN) 0.65 % SOLN nasal spray  Place 1 spray into both nostrils as needed for congestion.    [provider]    Physical Exam: Vitals:   10/12/19 0100 10/12/19 0111 10/12/19 0200 10/12/19 0230  BP: 110/74  126/83 114/77  Pulse: 84 82 83 81  Resp: 16  16 16   Temp:      TempSrc:      SpO2: (!) 85% 94% 92% 93%  Weight:      Height:        Constitutional: NAD, calm  Eyes: PERTLA, lids and conjunctivae normal ENMT: Mucous membranes are moist. Posterior pharynx clear of any exudate or lesions.   Neck: normal, supple, no masses, no thyromegaly Respiratory:  no wheezing, no crackles. No accessory muscle use.  Cardiovascular: S1 & S2 heard, regular rate and rhythm. No extremity edema.   Abdomen: soft, ventral hernia is soft and without discoloration or particular tenderness, though their some general tenderness without rebound pain or guarding. Bowel sounds are present.  Musculoskeletal: no clubbing / cyanosis. No joint deformity upper and lower extremities.   Skin: no significant rashes, lesions, ulcers. Warm, dry, well-perfused. Neurologic: CN 2-12 grossly intact. Sensation intact. Moving all extremities.  Psychiatric: Alert and oriented to person, place, and situation. Pleasant and cooperative.    Labs and Imaging on Admission: I have personally reviewed following labs and imaging studies  CBC: Recent Labs  Lab 10/11/19 1726  WBC 11.7*  HGB 15.8*  HCT 44.8  MCV 88.0  PLT 266   Basic Metabolic Panel: Recent Labs  Lab 10/11/19 1726  NA 140  K 4.0  CL 106  CO2 23  GLUCOSE 98  BUN 10  CREATININE 0.90  CALCIUM 9.6   GFR: Estimated Creatinine Clearance: 59.3 mL/min (by C-G formula based on SCr of 0.9 mg/dL). Liver Function Tests: Recent Labs  Lab 10/11/19 1726  AST 18  ALT 14  ALKPHOS 113  BILITOT 1.3*  PROT 7.6  ALBUMIN 4.6   Recent Labs  Lab 10/11/19 1726  LIPASE 63*   No results for input(s): AMMONIA in the last 168 hours. Coagulation Profile: No results for input(s): INR,  PROTIME in the last 168 hours. Cardiac Enzymes: No results for input(s): CKTOTAL, CKMB, CKMBINDEX, TROPONINI in the last 168 hours. BNP (last 3 results) No results for input(s): PROBNP in the last 8760 hours. HbA1C: No results for input(s): HGBA1C in the last 72 hours. CBG: No results for input(s): GLUCAP in the last 168 hours. Lipid Profile: No results for input(s): CHOL, HDL, LDLCALC, TRIG, CHOLHDL, LDLDIRECT in the last 72 hours. Thyroid Function Tests: No results for input(s): TSH, T4TOTAL, FREET4, T3FREE, THYROIDAB in the last 72 hours. Anemia Panel: No results for input(s): VITAMINB12, FOLATE, FERRITIN, TIBC, IRON, RETICCTPCT in the last 72 hours. Urine analysis:    Component Value Date/Time   COLORURINE YELLOW (A) 05/15/2018 1615   APPEARANCEUR CLEAR (A) 05/15/2018 1615  APPEARANCEUR Clear 06/28/2011 1412   LABSPEC 1.011 05/15/2018 1615   LABSPEC 1.005 06/28/2011 1412   PHURINE 6.0 05/15/2018 1615   GLUCOSEU NEGATIVE 05/15/2018 1615   GLUCOSEU Negative 06/28/2011 1412   HGBUR SMALL (A) 05/15/2018 1615   BILIRUBINUR neg 09/13/2019 0850   BILIRUBINUR Negative 06/28/2011 1412   KETONESUR NEGATIVE 05/15/2018 1615   PROTEINUR Negative 09/13/2019 0850   PROTEINUR NEGATIVE 05/15/2018 1615   UROBILINOGEN 0.2 09/13/2019 0850   NITRITE neg 09/13/2019 0850   NITRITE NEGATIVE 05/15/2018 1615   LEUKOCYTESUR Negative 09/13/2019 0850   LEUKOCYTESUR LARGE (A) 05/15/2018 1615   LEUKOCYTESUR Negative 06/28/2011 1412   Sepsis Labs: @LABRCNTIP (procalcitonin:4,lacticidven:4) )No results found for this or any previous visit (from the past 240 hour(s)).   Radiological Exams on Admission: CT ABDOMEN PELVIS W CONTRAST  Result Date: 10/12/2019 CLINICAL DATA:  Nausea vomiting, question of pancreatitis EXAM: CT ABDOMEN AND PELVIS WITH CONTRAST TECHNIQUE: Multidetector CT imaging of the abdomen and pelvis was performed using the standard protocol following bolus administration of intravenous  contrast. CONTRAST:  10/14/2019 OMNIPAQUE IOHEXOL 300 MG/ML  SOLN COMPARISON:  None. FINDINGS: Lower chest: The visualized heart size within normal limits. No pericardial fluid/thickening. There is a very large right-sided paraesophageal hernia with the majority of the stomach contents herniated within the chest. There is also a small portion of the transverse colon. The visualized lung bases are clear. Hepatobiliary: The liver is normal in density without focal abnormality.The main portal vein is patent. The patient is status post cholecystectomy. No biliary ductal dilation. Pancreas: Unremarkable. No pancreatic ductal dilatation or surrounding inflammatory changes. Spleen: Normal in size without focal abnormality. Adrenals/Urinary Tract: Both adrenal glands appear normal. Multiple tiny low-density lesions seen within both kidneys. Bladder is unremarkable. Stomach/Bowel: Lung as noted above there is a again noted a large paraesophageal hernia with the entirety of the stomach herniated within the chest wall. There is also a small portion of the transverse colon within the hernia sac. There is mildly dilated loops of jejunum and ileum down to the level distal ileum/terminal ileum. A small portion of ileum appears to be within the anterior abdominal wall hernia sac with a focal area of narrowing. Scattered colonic diverticula are noted. Vascular/Lymphatic: There are no enlarged mesenteric, retroperitoneal, or pelvic lymph nodes. Scattered aortic atherosclerotic calcifications are seen without aneurysmal dilatation. Reproductive: The uterus and adnexa are unremarkable. Other: There is a small anterior abdominal wall hernia containing a small amount of fluid and transverse colon. Musculoskeletal: No acute or significant osseous findings. IMPRESSION: Mildly dilated proximal small bowel to the level of the anterior abdominal wall hernia where there is a focal area of narrowing within the distal ileal loops, likely due to  partial small bowel obstruction. Again noted is a large paraesophageal hernia and a small portion of transverse colon seen within the hernia sac. Aortic Atherosclerosis (ICD10-I70.0). Electronically Signed   By: M.D.   On: 10/12/2019 00:38   DG Chest Portable 1 View  Result Date: 10/11/2019 CLINICAL DATA:  Shortness of breath EXAM: PORTABLE CHEST 1 VIEW COMPARISON:  05/08/2019 radiograph and chest CT, CT 03/09/2018 FINDINGS: Large right-sided hernia containing air-filled bowel. Opacity at the right base probably reflects chronic atelectasis. Partially obscured cardiomediastinal silhouette with aortic atherosclerosis. No pleural effusion or pneumothorax. IMPRESSION: Large right-sided hernia containing air-filled bowel. Probable chronic atelectasis at the right base. Electronically Signed   By: 03/11/2018 M.D.   On: 10/11/2019 23:55    Assessment/Plan   1. Partial SBO  -  Presents with periumbilical pain and N/V and has CT findings suggestive of ventral hernia with partial SBO in addition to the known large paraesophageal hernia  - ED physician reviewed scan with surgery and medical admission was recommended  - NGT was placed in ED  - Continue NGT decompression, pain-control, maintenance IVF    2. Paraesophageal hernia  - Patient reports undergoing outpatient evaluation for possible repair but not sure that she wants to proceed with that  - Continue PPI   3. Depression, anxiety  - Appears stable   4. Hypertension  - SBP 90s in ED, will treat as-needed only for now     DVT prophylaxis: SCDs Code Status: DNR, confirmed with patient  Family Communication: Discussed with patient  Disposition Plan:  Patient is from: Home  Anticipated d/c is to: TBD Anticipated d/c date is: Possibly as early as 10/14/19 Patient currently: Has partial SBO with N/V and having NGT placed  Consults called: Surgery consulted by ED physician  Admission status: Inpatient     Briscoe Deutscher,  MD [facility]  10/12/2019, 2:41 AM

## 2019-10-13 ENCOUNTER — Inpatient Hospital Stay: Payer: Medicare HMO

## 2019-10-13 LAB — BASIC METABOLIC PANEL
Anion gap: 13 (ref 5–15)
BUN: 26 mg/dL — ABNORMAL HIGH (ref 8–23)
CO2: 25 mmol/L (ref 22–32)
Calcium: 9 mg/dL (ref 8.9–10.3)
Chloride: 102 mmol/L (ref 98–111)
Creatinine, Ser: 1.42 mg/dL — ABNORMAL HIGH (ref 0.44–1.00)
GFR calc Af Amer: 43 mL/min — ABNORMAL LOW (ref >60)
GFR calc non Af Amer: 37 mL/min — ABNORMAL LOW (ref >60)
Glucose, Bld: 98 mg/dL (ref 70–99)
Potassium: 3.8 mmol/L (ref 3.5–5.1)
Sodium: 140 mmol/L (ref 135–145)

## 2019-10-13 LAB — URINALYSIS, COMPLETE (UACMP) WITH MICROSCOPIC
Bacteria, UA: NONE SEEN
Bilirubin Urine: NEGATIVE
Glucose, UA: NEGATIVE mg/dL
Ketones, ur: 5 mg/dL — AB
Nitrite: NEGATIVE
Protein, ur: 30 mg/dL — AB
Specific Gravity, Urine: 1.03 (ref 1.005–1.030)
pH: 5 (ref 5.0–8.0)

## 2019-10-13 LAB — CBC
HCT: 43.7 % (ref 36.0–46.0)
Hemoglobin: 15 g/dL (ref 12.0–15.0)
MCH: 30.6 pg (ref 26.0–34.0)
MCHC: 34.3 g/dL (ref 30.0–36.0)
MCV: 89.2 fL (ref 80.0–100.0)
Platelets: 263 10*3/uL (ref 150–400)
RBC: 4.9 MIL/uL (ref 3.87–5.11)
RDW: 13.3 % (ref 11.5–15.5)
WBC: 13.9 10*3/uL — ABNORMAL HIGH (ref 4.0–10.5)
nRBC: 0 % (ref 0.0–0.2)

## 2019-10-13 MED ORDER — OXYCODONE HCL 5 MG PO TABS: 5.0000 mg | ORAL_TABLET | ORAL | Status: DC | PRN

## 2019-10-13 MED ORDER — SIMETHICONE 80 MG PO CHEW
80.0000 mg | CHEWABLE_TABLET | Freq: Four times a day (QID) | ORAL | Status: DC | PRN
  Administered 2019-10-16 – 2019-10-17 (×2): 80 mg via ORAL
  Filled 2019-10-13 (×2): qty 1

## 2019-10-13 MED ORDER — SODIUM CHLORIDE 0.9 % IV SOLN: INTRAVENOUS | Status: DC

## 2019-10-13 MED ORDER — PHENOL 1.4 % MT LIQD
1.0000 | OROMUCOSAL | Status: DC | PRN
  Administered 2019-10-13: 1 via OROMUCOSAL
  Filled 2019-10-13: qty 177

## 2019-10-13 MED ORDER — MORPHINE SULFATE (PF) 2 MG/ML IV SOLN
2.0000 mg | INTRAVENOUS | Status: DC | PRN
  Administered 2019-10-13 – 2019-10-16 (×2): 2 mg via INTRAVENOUS
  Filled 2019-10-13 (×2): qty 1

## 2019-10-13 NOTE — Progress Notes (Signed)
PROGRESS NOTE    Katrina Sutton  OYD:[id number] DOB: 03/14/1951 DOA: 10/11/2019 PCP: Tarri Fuller, FNP  Brief Narrative:  HPI: Katrina Sutton is a 69 y.o. female who reports a medical history significant for large paraesophageal hernia, ventral hernia status post repair 2 years ago, hypertension, depression, and anxiety, presenting to emergency department with several days of periumbilical abdominal pain, nausea, and vomiting.  Patient reports that she had a ventral hernia repair approximately 2 years ago but reports that the hernia returned.  She has some pain around this area, but no discoloration and no fevers or chills.  She reports chronic nocturnal hypoxia but denies any recent shortness of breath or cough.  She denies hematemesis, melena, or hematochezia.  7/31: Patient seen and examined.  Reports abdominal pain is improved but still persistent.  Remains mildly distended.  NG tube in place.   Assessment & Plan:   Principal Problem:   Partial small bowel obstruction (HCC) Active Problems:   Depression with anxiety   Umbilical hernia   Paraesophageal hernia  Partial SBO -Presents with periumbilical pain and N/V and has CT findings suggestive of ventral hernia with partial SBO in addition to the known large paraesophageal hernia -ED physician reviewed scan with surgery and medical admission was recommended -NGT was placed in ED Plan: Continue NGT for gastric decompression Continue maintenance IV fluids Continue n.p.o. status for now Surgery following in case patient needs operative intervention  Paraesophageal hernia -Patient reports undergoing outpatient evaluation for possible repair but not sure that she wants to proceed with that -Continue PPI IV  Depression, anxiety -Restarted Klonopin and Paxil  Hypertension -Stable   DVT prophylaxis: Lovenox Code Status: Full Family Communication: None today Disposition Plan: Status is: Inpatient  Remains  inpatient appropriate because:Inpatient level of care appropriate due to severity of illness   Dispo: The patient is from: Home              Anticipated d/c is to: Home              Anticipated d/c date is: 2 days              Patient currently is not medically stable to d/c.  Still clinical evidence of bowel obstruction.  NG tube in place.  Surgery following       Consultants:   General surgery  Procedures:   NG tube  Antimicrobials:   None   Subjective: Patient seen and examined.  Complains of abdominal pain.  Objective: Vitals:   10/12/19 2024 10/12/19 2355 10/13/19 0408 10/13/19 1201  BP: (!) 146/72 (!) 130/84 (!) 138/82 (!) 130/79  Pulse: 93 88 90 94  Resp:  18  16  Temp: 98.5 F (36.9 C) 97.9 F (36.6 C) 98.5 F (36.9 C) 98.1 F (36.7 C)  TempSrc: Oral Oral Oral   SpO2: 100% 91% 94% (!) 88%  Weight:      Height:        Intake/Output Summary (Last 24 hours) at 10/13/2019 1223 Last data filed at 10/13/2019 1100 Gross per 24 hour  Intake 1174.44 ml  Output 800 ml  Net 374.44 ml   Filed Weights   10/11/19 1724  Weight: 74.8 kg    Examination:  General exam: Appears calm and comfortable  Respiratory system: Clear to auscultation. Respiratory effort normal. Cardiovascular system: S1 & S2 heard, RRR. No JVD, murmurs, rubs, gallops or clicks. No pedal edema. Gastrointestinal system: Nontender, mild distention, NG tube in place, decreased bowel  sounds Central nervous system: Alert and oriented. No focal neurological deficits. Extremities: Symmetric 5 x 5 power. Skin: No rashes, lesions or ulcers Psychiatry: Judgement and insight appear normal. Mood & affect appropriate.     Data Reviewed: I have personally reviewed following labs and imaging studies  CBC: Recent Labs  Lab 10/11/19 1726 10/12/19 0519 10/13/19 0550  WBC 11.7* 18.4* 13.9*  HGB 15.8* 16.5* 15.0  HCT 44.8 48.3* 43.7  MCV 88.0 90.8 89.2  PLT 266 285 263   Basic Metabolic  Panel: Recent Labs  Lab 10/11/19 1726 10/12/19 0519 10/13/19 0550  NA 140 138 140  K 4.0 4.3 3.8  CL 106 102 102  CO2 23 24 25   GLUCOSE 98 136* 98  BUN 10 18 26*  CREATININE 0.90 1.79* 1.42*  CALCIUM 9.6 9.7 9.0   GFR: Estimated Creatinine Clearance: 37.6 mL/min (A) (by C-G formula based on SCr of 1.42 mg/dL (H)). Liver Function Tests: Recent Labs  Lab 10/11/19 1726  AST 18  ALT 14  ALKPHOS 113  BILITOT 1.3*  PROT 7.6  ALBUMIN 4.6   Recent Labs  Lab 10/11/19 1726  LIPASE 63*   No results for input(s): AMMONIA in the last 168 hours. Coagulation Profile: No results for input(s): INR, PROTIME in the last 168 hours. Cardiac Enzymes: No results for input(s): CKTOTAL, CKMB, CKMBINDEX, TROPONINI in the last 168 hours. BNP (last 3 results) No results for input(s): PROBNP in the last 8760 hours. HbA1C: No results for input(s): HGBA1C in the last 72 hours. CBG: No results for input(s): GLUCAP in the last 168 hours. Lipid Profile: No results for input(s): CHOL, HDL, LDLCALC, TRIG, CHOLHDL, LDLDIRECT in the last 72 hours. Thyroid Function Tests: No results for input(s): TSH, T4TOTAL, FREET4, T3FREE, THYROIDAB in the last 72 hours. Anemia Panel: No results for input(s): VITAMINB12, FOLATE, FERRITIN, TIBC, IRON, RETICCTPCT in the last 72 hours. Sepsis Labs: No results for input(s): PROCALCITON, LATICACIDVEN in the last 168 hours.  Recent Results (from the past 240 hour(s))  SARS Coronavirus 2 by RT PCR (hospital order, [facility]) Nasopharyngeal Nasopharyngeal Swab     Status: None   Collection Time: 10/12/19  1:47 AM   Specimen: Nasopharyngeal Swab  Result Value Ref Range Status   SARS Coronavirus 2 NEGATIVE NEGATIVE Final    Comment: (NOTE) SARS-CoV-2 target nucleic acids are NOT DETECTED.  The SARS-CoV-2 RNA is generally detectable in upper and lower respiratory specimens during the acute phase of infection. The lowest concentration of  SARS-CoV-2 viral copies this assay can detect is 250 copies / mL. A negative result does not preclude SARS-CoV-2 infection and should not be used as the sole basis for treatment or other patient management decisions.  A negative result may occur with improper specimen collection / handling, submission of specimen other than nasopharyngeal swab, presence of viral mutation(s) within the areas targeted by this assay, and inadequate number of viral copies (<250 copies / mL). A negative result must be combined with clinical observations, patient history, and epidemiological information.  Fact Sheet for Patients:   BoilerBrush.com.cyhttps://www.fda.gov/media/136312/download  Fact Sheet for Healthcare Providers: https://pope.com/https://www.fda.gov/media/136313/download  This test is not yet approved or  cleared by the [place] FDA and has been authorized for detection and/or diagnosis of SARS-CoV-2 by FDA under an Emergency Use Authorization (EUA).  This EUA will remain in effect (meaning this test can be used) for the duration of the COVID-19 declaration under Section 564(b)(1) of the Act, 21 U.S.C. section  360bbb-3(b)(1), unless the authorization is terminated or revoked sooner.  Performed at [facility], [address], [place], [place] [address]          Radiology Studies: DG Abdomen 1 View  Result Date: 10/12/2019 CLINICAL DATA:  Nasogastric tube placement EXAM: ABDOMEN - 1 VIEW COMPARISON:  CT abdomen and pelvis October 12, 2019 FINDINGS: Note that the right hemidiaphragm is markedly elevated with large paraesophageal type hernia with stomach to the right of midline. Given this unusual appearance, nasogastric tube tip and side-port felt to be present within the stomach. There is no bowel dilatation or air-fluid level to suggest bowel obstruction. No free air. There is bibasilar atelectasis. IMPRESSION: Unusual course of the nasogastric tube felt to be secondary to large paraesophageal hernia with  stomach located to the right of midline in the mid chest region. Nasogastric tube tip and side port are felt to reside within the stomach; would advise confirmation of gastric aspirate in this regard given only single view for assessment currently. No bowel obstruction or free air. Electronically Signed   By: Bretta Bang III M.D.   On: 10/12/2019 05:58   CT ABDOMEN PELVIS W CONTRAST  Result Date: 10/12/2019 CLINICAL DATA:  Nausea vomiting, question of pancreatitis EXAM: CT ABDOMEN AND PELVIS WITH CONTRAST TECHNIQUE: Multidetector CT imaging of the abdomen and pelvis was performed using the standard protocol following bolus administration of intravenous contrast. CONTRAST:  OMNIPAQUE IOHEXOL 300 MG/ML  SOLN COMPARISON:  None. FINDINGS: Lower chest: The visualized heart size within normal limits. No pericardial fluid/thickening. There is a very large right-sided paraesophageal hernia with the majority of the stomach contents herniated within the chest. There is also a small portion of the transverse colon. The visualized lung bases are clear. Hepatobiliary: The liver is normal in density without focal abnormality.The main portal vein is patent. The patient is status post cholecystectomy. No biliary ductal dilation. Pancreas: Unremarkable. No pancreatic ductal dilatation or surrounding inflammatory changes. Spleen: Normal in size without focal abnormality. Adrenals/Urinary Tract: Both adrenal glands appear normal. Multiple tiny low-density lesions seen within both kidneys. Bladder is unremarkable. Stomach/Bowel: Lung as noted above there is a again noted a large paraesophageal hernia with the entirety of the stomach herniated within the chest wall. There is also a small portion of the transverse colon within the hernia sac. There is mildly dilated loops of jejunum and ileum down to the level distal ileum/terminal ileum. A small portion of ileum appears to be within the anterior abdominal wall hernia sac  with a focal area of narrowing. Scattered colonic diverticula are noted. Vascular/Lymphatic: There are no enlarged mesenteric, retroperitoneal, or pelvic lymph nodes. Scattered aortic atherosclerotic calcifications are seen without aneurysmal dilatation. Reproductive: The uterus and adnexa are unremarkable. Other: There is a small anterior abdominal wall hernia containing a small amount of fluid and transverse colon. Musculoskeletal: No acute or significant osseous findings. IMPRESSION: Mildly dilated proximal small bowel to the level of the anterior abdominal wall hernia where there is a focal area of narrowing within the distal ileal loops, likely due to partial small bowel obstruction. Again noted is a large paraesophageal hernia and a small portion of transverse colon seen within the hernia sac. Aortic Atherosclerosis (ICD10-I70.0). Electronically Signed   By: Jonna Clark M.D.   On: 10/12/2019 00:38   DG Chest Portable 1 View  Result Date: 10/11/2019 CLINICAL DATA:  Shortness of breath EXAM: PORTABLE CHEST 1 VIEW COMPARISON:  05/08/2019 radiograph and chest CT, CT 03/09/2018 FINDINGS:  Large right-sided hernia containing air-filled bowel. Opacity at the right base probably reflects chronic atelectasis. Partially obscured cardiomediastinal silhouette with aortic atherosclerosis. No pleural effusion or pneumothorax. IMPRESSION: Large right-sided hernia containing air-filled bowel. Probable chronic atelectasis at the right base. Electronically Signed   By: Jasmine Pang M.D.   On: 10/11/2019 23:55        Scheduled Meds: . pantoprazole (PROTONIX) IV  40 mg Intravenous Q24H  . PARoxetine  40 mg Oral Daily  . sodium chloride flush  3 mL Intravenous Once   Continuous Infusions: . sodium chloride 75 mL/hr at 10/13/19 1100     LOS: 1 day    Time spent: 15 minutes    Tresa Moore, MD [facility] Pager [phone]  If 7PM-7AM, please contact night-coverage 10/13/2019, 12:23 PM

## 2019-10-13 NOTE — Progress Notes (Signed)
Initial Nutrition Assessment  DOCUMENTATION CODES:   Not applicable  INTERVENTION:  Monitor for diet advancement per surgery recommendations, will provide oral nutrition supplements as appropriate  NUTRITION DIAGNOSIS:   Inadequate oral intake related to acute illness (partial SBO) as evidenced by per patient/family report (abdominal pain, nausea with vomiting over the past several days prior to admisison).    GOAL:   Patient will meet greater than or equal to 90% of their needs   MONITOR:   Diet advancement, Labs, I & O's, Weight trends  REASON FOR ASSESSMENT:   Malnutrition Screening Tool    ASSESSMENT:  RD working remotely.  69 year old female with medical history significant for large paraesophageal hernia, ventral hernia s/p repair 2 years ago, HTN, GERD presented with several day history of periumbilical abdominal pain, nausea, and vomiting admitted for partial small bowel obstruction.  General surgery consulted, NG tube placed for gastric decompression. Patient reports abdominal pain is improving but remains somewhat distended and is not passing flatus at this time. Surgery is following, plans to continue IV hydration and serial abdominal exams and radiographs. She is currently NPO, will monitor for diet advancement and provide oral nutrition supplements as appropriate.   Per chart weights have trended down ~6 lb (3.7%) in the last month which is insignificant.   Medications reviewed and include: IVF: NaCl @ 75 ml/hr Labs: BUN 26 (H), Cr 1.42 (H), WBC 13.9 (H)  NUTRITION - FOCUSED PHYSICAL EXAM: Unable to complete at this time, RD working remotely.  Diet Order:   Diet Order            Diet NPO time specified  Diet effective now                 EDUCATION NEEDS:   No education needs have been identified at this time  Skin:  Skin Assessment: Reviewed RN Assessment  Last BM:  7/31-type 4  Height:   Ht Readings from Last 1 Encounters:  10/11/19 5'  6" (1.676 m)    Weight:   Wt Readings from Last 1 Encounters:  10/11/19 74.8 kg    Ideal Body Weight:  59.1 kg  BMI:  Body mass index is 26.63 kg/m.  Estimated Nutritional Needs:   Kcal:  1600-1800  Protein:  80-90  Fluid:  >/= 1.6 L/day   Lars Masson, RD, LDN Clinical Nutrition After Hours/Weekend Pager # in Amion

## 2019-10-13 NOTE — Consult Note (Signed)
SURGICAL CONSULTATION NOTE   HISTORY OF PRESENT ILLNESS (HPI):  69 y.o. female presented to [facility] ED for evaluation of abdominal pain, nausea and vomiting. Patient reports starting with abdominal pain few days ago but intensified yesterday. Patient reports pain is on the center of the abdomen. Pain does not radiates to other part of the body. There has been no alleviating or aggravating factor. Patient denies passing gas. Cannot recall last bowel movement.   At the ED she was found with leukocytosis and elevated hemoglobin consisted with dehydration. CT scan of the abdomen shows small bowel dilation with a large paraesophageal hernia and recurrent umbilical hernia. I personally evaluated the images.   Surgery is consulted by Dr. York Cerise in this context for evaluation and management of small bowel obstruction.  INTERVAL HISTORY: She was admitted to the medicine service and an NG tube has been placed.  It appears that 600 cc came out yesterday with an additional 100 recorded as of this morning.  She denies any nausea or vomiting.  The periumbilical pain she was experiencing yesterday has resolved.  An abdominal film obtained this morning has not yet been read by radiology, but on my evaluation shows gaseous distention of the small intestine.  The majority of her stomach is in her chest along with part of her transverse colon consistent with her known history of large paraesophageal hernia.  Her leukocytosis has improved, as has her creatinine.  PAST MEDICAL HISTORY (PMH):  Past Medical History:  Diagnosis Date  . Allergy    seasonal  . Anxiety   . Arthritis    osteoarthritis  . Depression   . GERD (gastroesophageal reflux disease)   . Hiatal hernia      PAST SURGICAL HISTORY (PSH):  Past Surgical History:  Procedure Laterality Date  . CHOLECYSTECTOMY N/A 05/31/2018   Procedure: LAPAROSCOPIC CHOLECYSTECTOMY;  Surgeon: Duanne Guess, MD;  Location: [facility] ORS;  Service: General;   Laterality: N/A;  . TONSILLECTOMY    . UMBILICAL HERNIA REPAIR N/A 05/31/2018   Procedure: HERNIA REPAIR UMBILICAL ADULT;  Surgeon: Duanne Guess, MD;  Location: [facility] ORS;  Service: General;  Laterality: N/A;  . WRIST SURGERY Left      MEDICATIONS:  Prior to Admission medications   Medication Sig Start Date End Date Taking? Authorizing Provider  acetaminophen (TYLENOL) 500 MG tablet Take 500 mg by mouth every 8 (eight) hours as needed for headache.   Yes [provider]  clonazePAM (KLONOPIN) 1 MG tablet TAKE 1 TABLET BY MOUTH TWICE A DAY AS NEEDED ANXIETY 10/04/19  Yes Malfi, Jodelle Gross, FNP  fluticasone (FLONASE) 50 MCG/ACT nasal spray Place 1 spray into both nostrils daily as needed. 10/04/19  Yes [provider]  hydrocortisone cream 1 % Apply 1 application topically 2 (two) times daily as needed for itching.    Yes [provider]  linaclotide Karlene Einstein) 145 MCG CAPS capsule Take 1 capsule (145 mcg total) by mouth daily before breakfast. 07/05/19 01/01/20 Yes Wyline Mood, MD  lisinopril (ZESTRIL) 10 MG tablet Take 1 tablet (10 mg total) by mouth daily. 09/13/19  Yes Malfi, Jodelle Gross, FNP  omeprazole (PRILOSEC) 20 MG capsule Take 1 capsule (20 mg total) by mouth daily. In evening 06/29/19  Yes Malfi, Jodelle Gross, FNP  omeprazole (PRILOSEC) 40 MG capsule Take 1 capsule (40 mg total) by mouth daily. In morning 06/29/19  Yes Malfi, Jodelle Gross, FNP  PARoxetine (PAXIL) 40 MG tablet TAKE 1 TABLET BY MOUTH DAILY 04/13/19  Yes Saralyn Pilar  J, DO  polyethylene glycol (MIRALAX / GLYCOLAX) packet Take 17 g by mouth daily as needed.    Yes [provider]  sodium chloride (OCEAN) 0.65 % SOLN nasal spray Place 1 spray into both nostrils as needed for congestion.   Yes [provider]     ALLERGIES:  Allergies  Allergen Reactions  . Erythromycin Shortness Of Breath and Rash  . Duloxetine Palpitations     SOCIAL HISTORY:  Social History   Socioeconomic  History  . Marital status: Married    Spouse name: Not on file  . Number of children: Not on file  . Years of education: Not on file  . Highest education level: Not on file  Occupational History  . Not on file  Tobacco Use  . Smoking status: Former Smoker    Quit date: 12/27/1976    Years since quitting: 42.8  . Smokeless tobacco: Never Used  Vaping Use  . Vaping Use: Never used  Substance and Sexual Activity  . Alcohol use: No  . Drug use: No  . Sexual activity: Never    Birth control/protection: None  Other Topics Concern  . Not on file  Social History Narrative  . Not on file   Social Determinants of Health   Financial Resource Strain: Low Risk   . Difficulty of Paying Living Expenses: Not hard at all  Food Insecurity: No Food Insecurity  . Worried About Programme researcher, broadcasting/film/videounning Out of Food in the Last Year: Never true  . Ran Out of Food in the Last Year: Never true  Transportation Needs: No Transportation Needs  . Lack of Transportation (Medical): No  . Lack of Transportation (Non-Medical): No  Physical Activity: Insufficiently Active  . Days of Exercise per Week: 3 days  . Minutes of Exercise per Session: 30 min  Stress: Stress Concern Present  . Feeling of Stress : To some extent  Social Connections: Moderately Isolated  . Frequency of Communication with Friends and Family: More than three times a week  . Frequency of Social Gatherings with Friends and Family: More than three times a week  . Attends Religious Services: Never  . Active Member of Clubs or Organizations: No  . Attends BankerClub or Organization Meetings: Never  . Marital Status: Married  Catering managerntimate Partner Violence: Not At Risk  . Fear of Current or Ex-Partner: No  . Emotionally Abused: No  . Physically Abused: No  . Sexually Abused: No      FAMILY HISTORY:  Family History  Problem Relation Age of Onset  . Mental illness Mother   . Ulcers Father   . Stroke Neg Hx   . Heart attack Neg Hx   . Cancer Neg Hx       REVIEW OF SYSTEMS:  Constitutional: denies weight loss, fever, chills, or sweats  Eyes: denies any other vision changes, history of eye injury  ENT: denies sore throat, hearing problems  Respiratory: denies shortness of breath, wheezing  Cardiovascular: denies chest pain, palpitations  Gastrointestinal: positive abdominal pain, nausea and vomiting Genitourinary: denies burning with urination or urinary frequency Musculoskeletal: denies any other joint pains or cramps  Skin: denies any other rashes or skin discolorations  Neurological: denies any other headache, dizziness, weakness  Psychiatric: denies any other depression, anxiety   All other review of systems were negative   VITAL SIGNS:  Temp:  [97.9 F (36.6 C)-98.5 F (36.9 C)] 98.5 F (36.9 C) (07/31 0408) Pulse Rate:  [80-96] 90 (07/31 0408) Resp:  [18-20]  18 (07/30 2355) BP: (130-156)/(72-90) 138/82 (07/31 0408) SpO2:  [91 %-100 %] 94 % (07/31 0408)     Height: 5\' 6"  (167.6 cm) Weight: 74.8 kg BMI (Calculated): 26.64   INTAKE/OUTPUT:  This shift: Total I/O In: 171.8 [I.V.:171.8] Out: 200 [Urine:100; Emesis/NG output:100]  Last 2 shifts: @IOLAST2SHIFTS @   PHYSICAL EXAM:  Constitutional:  -- Normal body habitus  -- Awake, alert, and oriented x3  Eyes:  -- Pupils equally round and reactive to light  -- No scleral icterus  Ear, nose, and throat:  -- No jugular venous distension  Pulmonary:  -- No crackles  -- Equal breath sounds bilaterally -- Breathing non-labored at rest Cardiovascular:  -- S1, S2 present  -- No pericardial rubs Gastrointestinal:  -- Abdomen soft, mild, distended, tympanic, no guarding or rebound tenderness -- Unable to palpate periumbilical hernia defect, no tenderness.   Musculoskeletal and Integumentary:  -- Wounds: None appreciated -- Extremities: B/L UE and LE FROM, hands and feet warm, no edema  Neurologic:  -- Motor function: intact and symmetric -- Sensation: intact and  symmetric   Labs:  CBC Latest Ref Rng & Units 10/13/2019 10/12/2019 10/11/2019  WBC 4.0 - 10.5 K/uL 13.9(H) 18.4(H) 11.7(H)  Hemoglobin 12.0 - 15.0 g/dL 10/14/2019 16.5(H) 15.8(H)  Hematocrit 36 - 46 % 43.7 48.3(H) 44.8  Platelets 150 - 400 K/uL 263 285 266   CMP Latest Ref Rng & Units 10/13/2019 10/12/2019 10/11/2019  Glucose 70 - 99 mg/dL 98 10/14/2019) 98  BUN 8 - 23 mg/dL 10/13/2019) 18 10  Creatinine 0.44 - 1.00 mg/dL 010(U) 72(Z) 3.66(Y  Sodium 135 - 145 mmol/L 140 138 140  Potassium 3.5 - 5.1 mmol/L 3.8 4.3 4.0  Chloride 98 - 111 mmol/L 102 102 106  CO2 22 - 32 mmol/L 25 24 23   Calcium 8.9 - 10.3 mg/dL 9.0 9.7 9.6  Total Protein 6.5 - 8.1 g/dL - - 7.6  Total Bilirubin 0.3 - 1.2 mg/dL - - 1.3(H)  Alkaline Phos 38 - 126 U/L - - 113  AST 15 - 41 U/L - - 18  ALT 0 - 44 U/L - - 14    Imaging studies:  EXAM: CT ABDOMEN AND PELVIS WITH CONTRAST  TECHNIQUE: Multidetector CT imaging of the abdomen and pelvis was performed using the standard protocol following bolus administration of intravenous contrast.  CONTRAST:  4.03(K OMNIPAQUE IOHEXOL 300 MG/ML  SOLN  COMPARISON:  None.  FINDINGS: Lower chest: The visualized heart size within normal limits. No pericardial fluid/thickening.  There is a very large right-sided paraesophageal hernia with the majority of the stomach contents herniated within the chest. There is also a small portion of the transverse colon. The visualized lung bases are clear.  Hepatobiliary: The liver is normal in density without focal abnormality.The main portal vein is patent. The patient is status post cholecystectomy. No biliary ductal dilation.  Pancreas: Unremarkable. No pancreatic ductal dilatation or surrounding inflammatory changes.  Spleen: Normal in size without focal abnormality.  Adrenals/Urinary Tract: Both adrenal glands appear normal. Multiple tiny low-density lesions seen within both kidneys. Bladder is unremarkable.  Stomach/Bowel:  Lung as noted above there is a again noted a large paraesophageal hernia with the entirety of the stomach herniated within the chest wall. There is also a small portion of the transverse colon within the hernia sac. There is mildly dilated loops of jejunum and ileum down to the level distal ileum/terminal ileum. A small portion of ileum appears to be within the anterior abdominal wall hernia  sac with a focal area of narrowing. Scattered colonic diverticula are noted.  Vascular/Lymphatic: There are no enlarged mesenteric, retroperitoneal, or pelvic lymph nodes. Scattered aortic atherosclerotic calcifications are seen without aneurysmal dilatation.  Reproductive: The uterus and adnexa are unremarkable.  Other: There is a small anterior abdominal wall hernia containing a small amount of fluid and transverse colon.  Musculoskeletal: No acute or significant osseous findings.  IMPRESSION: Mildly dilated proximal small bowel to the level of the anterior abdominal wall hernia where there is a focal area of narrowing within the distal ileal loops, likely due to partial small bowel obstruction.  Again noted is a large paraesophageal hernia and a small portion of transverse colon seen within the hernia sac.  Aortic Atherosclerosis (ICD10-I70.0).   Electronically Signed   By: Jonna Clark M.D.   On: 10/12/2019 00:38  Assessment/Plan:  68 y.o. female with small bowel obstruction, complicated by pertinent comorbidities including hypertension, Bipolar 1 disorder.  Patient with history, physical exam and images consistent with small bowel obstruction.  Her abdominal pain seems to be resolving however she remains somewhat distended.  She is not passing flatus at this time.  I recommend continued medical management of small bowel obstruction. Will continue to follow closely to decide if she will need surgery for the umbilical hernia if the obstruction does not resolve. Continue IV  hydration and follow closely with serial abdominal exams/radiographs.

## 2019-10-14 ENCOUNTER — Inpatient Hospital Stay: Payer: Medicare HMO

## 2019-10-14 DIAGNOSIS — K56609 Unspecified intestinal obstruction, unspecified as to partial versus complete obstruction: Secondary | ICD-10-CM

## 2019-10-14 LAB — BASIC METABOLIC PANEL
Anion gap: 13 (ref 5–15)
BUN: 27 mg/dL — ABNORMAL HIGH (ref 8–23)
CO2: 25 mmol/L (ref 22–32)
Calcium: 9 mg/dL (ref 8.9–10.3)
Chloride: 105 mmol/L (ref 98–111)
Creatinine, Ser: 1.09 mg/dL — ABNORMAL HIGH (ref 0.44–1.00)
GFR calc Af Amer: 59 mL/min — ABNORMAL LOW (ref >60)
GFR calc non Af Amer: 51 mL/min — ABNORMAL LOW (ref >60)
Glucose, Bld: 77 mg/dL (ref 70–99)
Potassium: 3.7 mmol/L (ref 3.5–5.1)
Sodium: 143 mmol/L (ref 135–145)

## 2019-10-14 LAB — CBC
HCT: 45.9 % (ref 36.0–46.0)
Hemoglobin: 15.7 g/dL — ABNORMAL HIGH (ref 12.0–15.0)
MCH: 31.2 pg (ref 26.0–34.0)
MCHC: 34.2 g/dL (ref 30.0–36.0)
MCV: 91.3 fL (ref 80.0–100.0)
Platelets: 258 10*3/uL (ref 150–400)
RBC: 5.03 MIL/uL (ref 3.87–5.11)
RDW: 13.2 % (ref 11.5–15.5)
WBC: 11.9 10*3/uL — ABNORMAL HIGH (ref 4.0–10.5)
nRBC: 0 % (ref 0.0–0.2)

## 2019-10-14 NOTE — Consult Note (Signed)
SURGICAL CONSULTATION NOTE   HISTORY OF PRESENT ILLNESS (HPI):  69 y.o. female presented to [facility] ED for evaluation of abdominal pain, nausea and vomiting. Patient reports starting with abdominal pain few days ago but intensified yesterday. Patient reports pain is on the center of the abdomen. Pain does not radiates to other part of the body. There has been no alleviating or aggravating factor. Patient denies passing gas. Cannot recall last bowel movement.   At the ED she was found with leukocytosis and elevated hemoglobin consisted with dehydration. CT scan of the abdomen shows small bowel dilation with a large paraesophageal hernia and recurrent umbilical hernia. I personally evaluated the images.   Surgery is consulted by Dr. York Cerise in this context for evaluation and management of small bowel obstruction.  INTERVAL HISTORY: She reports that she has had some small bowel movements and passed flatus.  Her NG tube had 1000 cc recorded for the past 24 hours.  She states that she has not passed gas today but feels gurgling and rumbling in her abdomen.  PAST MEDICAL HISTORY (PMH):  Past Medical History:  Diagnosis Date  . Allergy    seasonal  . Anxiety   . Arthritis    osteoarthritis  . Depression   . GERD (gastroesophageal reflux disease)   . Hiatal hernia      PAST SURGICAL HISTORY (PSH):  Past Surgical History:  Procedure Laterality Date  . CHOLECYSTECTOMY N/A 05/31/2018   Procedure: LAPAROSCOPIC CHOLECYSTECTOMY;  Surgeon: Duanne Guess, MD;  Location: [facility] ORS;  Service: General;  Laterality: N/A;  . TONSILLECTOMY    . UMBILICAL HERNIA REPAIR N/A 05/31/2018   Procedure: HERNIA REPAIR UMBILICAL ADULT;  Surgeon: Duanne Guess, MD;  Location: [facility] ORS;  Service: General;  Laterality: N/A;  . WRIST SURGERY Left      MEDICATIONS:  Prior to Admission medications   Medication Sig Start Date End Date Taking? Authorizing Provider  acetaminophen (TYLENOL) 500 MG tablet Take 500 mg  by mouth every 8 (eight) hours as needed for headache.   Yes [provider]  clonazePAM (KLONOPIN) 1 MG tablet TAKE 1 TABLET BY MOUTH TWICE A DAY AS NEEDED ANXIETY 10/04/19  Yes Malfi, Jodelle Gross, FNP  fluticasone (FLONASE) 50 MCG/ACT nasal spray Place 1 spray into both nostrils daily as needed. 10/04/19  Yes [provider]  hydrocortisone cream 1 % Apply 1 application topically 2 (two) times daily as needed for itching.    Yes [provider]  linaclotide Karlene Einstein) 145 MCG CAPS capsule Take 1 capsule (145 mcg total) by mouth daily before breakfast. 07/05/19 01/01/20 Yes Wyline Mood, MD  lisinopril (ZESTRIL) 10 MG tablet Take 1 tablet (10 mg total) by mouth daily. 09/13/19  Yes Malfi, Jodelle Gross, FNP  omeprazole (PRILOSEC) 20 MG capsule Take 1 capsule (20 mg total) by mouth daily. In evening 06/29/19  Yes Malfi, Jodelle Gross, FNP  omeprazole (PRILOSEC) 40 MG capsule Take 1 capsule (40 mg total) by mouth daily. In morning 06/29/19  Yes Malfi, Jodelle Gross, FNP  PARoxetine (PAXIL) 40 MG tablet TAKE 1 TABLET BY MOUTH DAILY 04/13/19  Yes Karamalegos, Netta Neat, DO  polyethylene glycol (MIRALAX / GLYCOLAX) packet Take 17 g by mouth daily as needed.    Yes [provider]  sodium chloride (OCEAN) 0.65 % SOLN nasal spray Place 1 spray into both nostrils as needed for congestion.   Yes [provider]     ALLERGIES:  Allergies  Allergen Reactions  . Erythromycin Shortness Of Breath  and Rash  . Duloxetine Palpitations     SOCIAL HISTORY:  Social History   Socioeconomic History  . Marital status: Married    Spouse name: Not on file  . Number of children: Not on file  . Years of education: Not on file  . Highest education level: Not on file  Occupational History  . Not on file  Tobacco Use  . Smoking status: Former Smoker    Quit date: 12/27/1976    Years since quitting: 42.8  . Smokeless tobacco: Never Used  Vaping Use  . Vaping Use: Never used  Substance and  Sexual Activity  . Alcohol use: No  . Drug use: No  . Sexual activity: Never    Birth control/protection: None  Other Topics Concern  . Not on file  Social History Narrative  . Not on file   Social Determinants of Health   Financial Resource Strain: Low Risk   . Difficulty of Paying Living Expenses: Not hard at all  Food Insecurity: No Food Insecurity  . Worried About Programme researcher, broadcasting/film/video in the Last Year: Never true  . Ran Out of Food in the Last Year: Never true  Transportation Needs: No Transportation Needs  . Lack of Transportation (Medical): No  . Lack of Transportation (Non-Medical): No  Physical Activity: Insufficiently Active  . Days of Exercise per Week: 3 days  . Minutes of Exercise per Session: 30 min  Stress: Stress Concern Present  . Feeling of Stress : To some extent  Social Connections: Moderately Isolated  . Frequency of Communication with Friends and Family: More than three times a week  . Frequency of Social Gatherings with Friends and Family: More than three times a week  . Attends Religious Services: Never  . Active Member of Clubs or Organizations: No  . Attends Banker Meetings: Never  . Marital Status: Married  Catering manager Violence: Not At Risk  . Fear of Current or Ex-Partner: No  . Emotionally Abused: No  . Physically Abused: No  . Sexually Abused: No      FAMILY HISTORY:  Family History  Problem Relation Age of Onset  . Mental illness Mother   . Ulcers Father   . Stroke Neg Hx   . Heart attack Neg Hx   . Cancer Neg Hx      REVIEW OF SYSTEMS:  Constitutional: denies weight loss, fever, chills, or sweats  Eyes: denies any other vision changes, history of eye injury  ENT: denies sore throat, hearing problems  Respiratory: denies shortness of breath, wheezing  Cardiovascular: denies chest pain, palpitations  Gastrointestinal: positive abdominal pain, nausea and vomiting Genitourinary: denies burning with urination or  urinary frequency Musculoskeletal: denies any other joint pains or cramps  Skin: denies any other rashes or skin discolorations  Neurological: denies any other headache, dizziness, weakness  Psychiatric: denies any other depression, anxiety   All other review of systems were negative   VITAL SIGNS:  Temp:  [97.6 F (36.4 C)-98.1 F (36.7 C)] 97.6 F (36.4 C) (08/01 1142) Pulse Rate:  [75-81] 81 (08/01 1142) Resp:  [16-20] 16 (08/01 1142) BP: (139-152)/(72-84) 145/75 (08/01 1142) SpO2:  [86 %-98 %] 95 % (08/01 1142)     Height: 5\' 6"  (167.6 cm) Weight: 74.8 kg BMI (Calculated): 26.64   INTAKE/OUTPUT:  This shift: Total I/O In: 570.2 [I.V.:570.2] Out: 0   Last 2 shifts: @IOLAST2SHIFTS @   PHYSICAL EXAM:  Constitutional:  -- Normal body habitus  -- Awake,  alert, and oriented x3  Eyes:  -- Pupils equally round and reactive to light  -- No scleral icterus  Ear, nose, and throat:  -- No jugular venous distension  Pulmonary:  -- No crackles  -- Equal breath sounds bilaterally -- Breathing non-labored at rest Cardiovascular:  -- S1, S2 present  -- No pericardial rubs Gastrointestinal:  -- Abdomen soft, distention improved.  No guarding or rebound tenderness --I was able to visualize the bulge just cranial to the umbilicus and reduce it, although I still do not appreciate palpable fascial edges. Musculoskeletal and Integumentary:  -- Wounds: None appreciated -- Extremities: B/L UE and LE FROM, hands and feet warm, no edema  Neurologic:  -- Motor function: intact and symmetric -- Sensation: intact and symmetric   Labs:  CBC Latest Ref Rng & Units 10/14/2019 10/13/2019 10/12/2019  WBC 4.0 - 10.5 K/uL 11.9(H) 13.9(H) 18.4(H)  Hemoglobin 12.0 - 15.0 g/dL 15.7(H) 15.0 16.5(H)  Hematocrit 36 - 46 % 45.9 43.7 48.3(H)  Platelets 150 - 400 K/uL 258 263 285   CMP Latest Ref Rng & Units 10/14/2019 10/13/2019 10/12/2019  Glucose 70 - 99 mg/dL 77 98 660(Y)  BUN 8 - 23 mg/dL 30(Z) 60(F)  18  Creatinine 0.44 - 1.00 mg/dL 0.93(A) 3.55(D) 3.22(G)  Sodium 135 - 145 mmol/L 143 140 138  Potassium 3.5 - 5.1 mmol/L 3.7 3.8 4.3  Chloride 98 - 111 mmol/L 105 102 102  CO2 22 - 32 mmol/L 25 25 24   Calcium 8.9 - 10.3 mg/dL 9.0 9.0 9.7  Total Protein 6.5 - 8.1 g/dL - - -  Total Bilirubin 0.3 - 1.2 mg/dL - - -  Alkaline Phos 38 - 126 U/L - - -  AST 15 - 41 U/L - - -  ALT 0 - 44 U/L - - -    Imaging studies:  EXAM: CT ABDOMEN AND PELVIS WITH CONTRAST  TECHNIQUE: Multidetector CT imaging of the abdomen and pelvis was performed using the standard protocol following bolus administration of intravenous contrast.  CONTRAST:  OMNIPAQUE IOHEXOL 300 MG/ML  SOLN  COMPARISON:  None.  FINDINGS: Lower chest: The visualized heart size within normal limits. No pericardial fluid/thickening.  There is a very large right-sided paraesophageal hernia with the majority of the stomach contents herniated within the chest. There is also a small portion of the transverse colon. The visualized lung bases are clear.  Hepatobiliary: The liver is normal in density without focal abnormality.The main portal vein is patent. The patient is status post cholecystectomy. No biliary ductal dilation.  Pancreas: Unremarkable. No pancreatic ductal dilatation or surrounding inflammatory changes.  Spleen: Normal in size without focal abnormality.  Adrenals/Urinary Tract: Both adrenal glands appear normal. Multiple tiny low-density lesions seen within both kidneys. Bladder is unremarkable.  Stomach/Bowel: Lung as noted above there is a again noted a large paraesophageal hernia with the entirety of the stomach herniated within the chest wall. There is also a small portion of the transverse colon within the hernia sac. There is mildly dilated loops of jejunum and ileum down to the level distal ileum/terminal ileum. A small portion of ileum appears to be within the anterior abdominal wall  hernia sac with a focal area of narrowing. Scattered colonic diverticula are noted.  Vascular/Lymphatic: There are no enlarged mesenteric, retroperitoneal, or pelvic lymph nodes. Scattered aortic atherosclerotic calcifications are seen without aneurysmal dilatation.  Reproductive: The uterus and adnexa are unremarkable.  Other: There is a small anterior abdominal wall hernia containing a small  amount of fluid and transverse colon.  Musculoskeletal: No acute or significant osseous findings.  IMPRESSION: Mildly dilated proximal small bowel to the level of the anterior abdominal wall hernia where there is a focal area of narrowing within the distal ileal loops, likely due to partial small bowel obstruction.  Again noted is a large paraesophageal hernia and a small portion of transverse colon seen within the hernia sac.  Aortic Atherosclerosis (ICD10-I70.0).   Electronically Signed   By: Jonna ClarkBindu  Avutu M.D.   On: 10/12/2019 00:38   CLINICAL DATA:  Abdominal distension. Admitted yesterday for abdominal pain, nausea and vomiting.  EXAM: ABDOMEN - 1 VIEW  COMPARISON:  Plain film of the abdomen dated 10/12/2019. CT abdomen dated 10/12/2019.  FINDINGS: Distended gas-filled loops of small bowel are again seen throughout the abdomen and pelvis, compatible with the findings of partial small bowel obstruction on earlier CT abdomen.  No evidence of free intraperitoneal air. Cholecystectomy clips in the RIGHT upper quadrant. No acute or suspicious osseous finding.  Again noted is RIGHT were displaced of the nasogastric tube, stable. Underlying bowel loops are likely associated with the Paris off a GIA lymph hiatal hernia better demonstrated on earlier CT abdomen.  IMPRESSION: 1. Persistent evidence of small-bowel obstruction. 2. Stable rightward course of the nasogastric tube. As described on the earlier plain film report of 10/12/2019, this is an unusual course  for a nasogastric tube but does correspond to the anatomy seen on recent CT abdomen related to the large paraesophageal hernia resulting in the stomach being located to the RIGHT of midline.  Abdominal plain film obtained today has not yet been interpreted by radiology, however on my review, there appears to be significant improvement in the gaseous distention of the small bowel, as well as feces mixed with contrast with in the ascending colon.  Assessment/Plan:  69 y.o. female with small bowel obstruction, complicated by pertinent comorbidities including hypertension, Bipolar 1 disorder.  --NG output is still a bit high to consider removal.  If she continues to pass flatus and have bowel movements, a clamping trial can be considered. --Encourage ambulation --Continue to monitor abdominal exam, including radiographs as indicated.

## 2019-10-14 NOTE — Progress Notes (Signed)
PROGRESS NOTE    Nashaly RYLIEE FIGGE  SJG:[id number] DOB: 1950-07-08 DOA: 10/11/2019 PCP: Tarri Fuller, FNP  Brief Narrative:  HPI: Katrina Sutton is a 69 y.o. female who reports a medical history significant for large paraesophageal hernia, ventral hernia status post repair 2 years ago, hypertension, depression, and anxiety, presenting to emergency department with several days of periumbilical abdominal pain, nausea, and vomiting.  Patient reports that she had a ventral hernia repair approximately 2 years ago but reports that the hernia returned.  She has some pain around this area, but no discoloration and no fevers or chills.  She reports chronic nocturnal hypoxia but denies any recent shortness of breath or cough.  She denies hematemesis, melena, or hematochezia.  7/31: Patient seen and examined.  Reports abdominal pain is improved but still persistent.  Remains mildly distended.  NG tube in place.  8/1: Patient seen and examined.  Endorses some small bowel movements and small passage of flatus.  1000 cc over 24 hours of NG tube.   Assessment & Plan:   Principal Problem:   Partial small bowel obstruction (HCC) Active Problems:   Depression with anxiety   Umbilical hernia   Paraesophageal hernia  Partial SBO -Presents with periumbilical pain and N/V and has CT findings suggestive of ventral hernia with partial SBO in addition to the known large paraesophageal hernia -ED physician reviewed scan with surgery and medical admission was recommended -NGT was placed in ED Per surgery NG tube output remains too high to consider removal Plan: Continue NGT for gastric decompression Continue maintenance IV fluids Continue n.p.o. status for now Surgery following in case patient needs operative intervention Ambulation  Paraesophageal hernia -Patient reports undergoing outpatient evaluation for possible repair but not sure that she wants to proceed with that -Continue PPI  IV  Depression, anxiety -Restarted Klonopin and Paxil  Hypertension -Stable   DVT prophylaxis: Lovenox Code Status: Full Family Communication: None today Disposition Plan: Status is: Inpatient  Remains inpatient appropriate because:Inpatient level of care appropriate due to severity of illness   Dispo: The patient is from: Home              Anticipated d/c is to: Home              Anticipated d/c date is: 2 days              Patient currently is not medically stable to d/c.  Patient still with clinical evidence of bowel obstruction.  Possible clamping trial within the next 24 hours.  Surgery following.  Anticipate 2 to 3 days prior to discharge.       Consultants:   General surgery  Procedures:   NG tube  Antimicrobials:   None   Subjective: Patient seen and examined.  Complains of abdominal pain.  Objective: Vitals:   10/13/19 1345 10/13/19 1947 10/14/19 0445 10/14/19 1142  BP:  (!) 139/72 (!) 152/84 (!) 145/75  Pulse:  75 78 81  Resp:  20 20 16   Temp:  98.1 F (36.7 C) 98 F (36.7 C) 97.6 F (36.4 C)  TempSrc:   Oral Oral  SpO2: 95% 95% 98% 95%  Weight:      Height:        Intake/Output Summary (Last 24 hours) at 10/14/2019 1312 Last data filed at 10/14/2019 1151 Gross per 24 hour  Intake 1770.27 ml  Output 900 ml  Net 870.27 ml   Filed Weights   10/11/19 1724  Weight: 74.8 kg  Examination:  General: No apparent distress, patient appears chronically ill HEENT: Normocephalic, atraumatic, NG tube in place Neck, supple, trachea midline, no tenderness Heart: Regular rate and rhythm, S1/S2 normal, no murmurs Lungs: Clear to auscultation bilaterally, no adventitious sounds, normal work of breathing Abdomen: Soft, mild tender to palpation, mild distention, hypoactive bowel sounds Extremities: Normal, atraumatic, no clubbing or cyanosis, normal muscle tone Skin: No rashes or lesions, normal color Neurologic: Cranial nerves grossly  intact, sensation intact, alert and oriented x3 Psychiatric: Normal affect     Data Reviewed: I have personally reviewed following labs and imaging studies  CBC: Recent Labs  Lab 10/11/19 1726 10/12/19 0519 10/13/19 0550 10/14/19 0527  WBC 11.7* 18.4* 13.9* 11.9*  HGB 15.8* 16.5* 15.0 15.7*  HCT 44.8 48.3* 43.7 45.9  MCV 88.0 90.8 89.2 91.3  PLT 266 285 263 258   Basic Metabolic Panel: Recent Labs  Lab 10/11/19 1726 10/12/19 0519 10/13/19 0550 10/14/19 0527  NA 140 138 140 143  K 4.0 4.3 3.8 3.7  CL 106 102 102 105  CO2 23 24 25 25   GLUCOSE 98 136* 98 77  BUN 10 18 26* 27*  CREATININE 0.90 1.79* 1.42* 1.09*  CALCIUM 9.6 9.7 9.0 9.0   GFR: Estimated Creatinine Clearance: 48.9 mL/min (A) (by C-G formula based on SCr of 1.09 mg/dL (H)). Liver Function Tests: Recent Labs  Lab 10/11/19 1726  AST 18  ALT 14  ALKPHOS 113  BILITOT 1.3*  PROT 7.6  ALBUMIN 4.6   Recent Labs  Lab 10/11/19 1726  LIPASE 63*   No results for input(s): AMMONIA in the last 168 hours. Coagulation Profile: No results for input(s): INR, PROTIME in the last 168 hours. Cardiac Enzymes: No results for input(s): CKTOTAL, CKMB, CKMBINDEX, TROPONINI in the last 168 hours. BNP (last 3 results) No results for input(s): PROBNP in the last 8760 hours. HbA1C: No results for input(s): HGBA1C in the last 72 hours. CBG: No results for input(s): GLUCAP in the last 168 hours. Lipid Profile: No results for input(s): CHOL, HDL, LDLCALC, TRIG, CHOLHDL, LDLDIRECT in the last 72 hours. Thyroid Function Tests: No results for input(s): TSH, T4TOTAL, FREET4, T3FREE, THYROIDAB in the last 72 hours. Anemia Panel: No results for input(s): VITAMINB12, FOLATE, FERRITIN, TIBC, IRON, RETICCTPCT in the last 72 hours. Sepsis Labs: No results for input(s): PROCALCITON, LATICACIDVEN in the last 168 hours.  Recent Results (from the past 240 hour(s))  SARS Coronavirus 2 by RT PCR (hospital order, performed in  [facility] hospital lab) Nasopharyngeal Nasopharyngeal Swab     Status: None   Collection Time: 10/12/19  1:47 AM   Specimen: Nasopharyngeal Swab  Result Value Ref Range Status   SARS Coronavirus 2 NEGATIVE NEGATIVE Final    Comment: (NOTE) SARS-CoV-2 target nucleic acids are NOT DETECTED.  The SARS-CoV-2 RNA is generally detectable in upper and lower respiratory specimens during the acute phase of infection. The lowest concentration of SARS-CoV-2 viral copies this assay can detect is 250 copies / mL. A negative result does not preclude SARS-CoV-2 infection and should not be used as the sole basis for treatment or other patient management decisions.  A negative result may occur with improper specimen collection / handling, submission of specimen other than nasopharyngeal swab, presence of viral mutation(s) within the areas targeted by this assay, and inadequate number of viral copies (<250 copies / mL). A negative result must be combined with clinical observations, patient history, and epidemiological information.  Fact Sheet for Patients:  BoilerBrush.com.cy  Fact Sheet for Healthcare Providers: https://pope.com/  This test is not yet approved or  cleared by the [place] FDA and has been authorized for detection and/or diagnosis of SARS-CoV-2 by FDA under an Emergency Use Authorization (EUA).  This EUA will remain in effect (meaning this test can be used) for the duration of the COVID-19 declaration under Section 564(b)(1) of the Act, 21 U.S.C. section 360bbb-3(b)(1), unless the authorization is terminated or revoked sooner.  Performed at [facility], [address] [place], [place] [address]          Radiology Studies: DG Abd 1 View  Result Date: 10/14/2019 CLINICAL DATA:  Follow-up small bowel obstruction. EXAM: ABDOMEN - 1 VIEW COMPARISON:  10/13/2019 and earlier, including CT abdomen and pelvis  10/12/2019. FINDINGS: Improvement in the partial small bowel obstruction since yesterday, though there are scattered persistent mildly distended loops of small bowel in the mid and upper abdomen. Gas and high attenuation stool within normal caliber ascending and transverse colon. No suggestion of free air on the supine image. Surgical clips in the RIGHT UPPER QUADRANT from prior cholecystectomy. Phleboliths in the LEFT side of the pelvis. IMPRESSION: Improvement in the partial small bowel obstruction since yesterday, though there are persistent mildly distended loops of small bowel in the mid and upper abdomen. Electronically Signed   By: Hulan Saas M.D.   On: 10/14/2019 12:31   DG Abd 1 View  Result Date: 10/13/2019 CLINICAL DATA:  Abdominal distension. Admitted yesterday for abdominal pain, nausea and vomiting. EXAM: ABDOMEN - 1 VIEW COMPARISON:  Plain film of the abdomen dated 10/12/2019. CT abdomen dated 10/12/2019. FINDINGS: Distended gas-filled loops of small bowel are again seen throughout the abdomen and pelvis, compatible with the findings of partial small bowel obstruction on earlier CT abdomen. No evidence of free intraperitoneal air. Cholecystectomy clips in the RIGHT upper quadrant. No acute or suspicious osseous finding. Again noted is RIGHT were displaced of the nasogastric tube, stable. Underlying bowel loops are likely associated with the Paris off a GIA lymph hiatal hernia better demonstrated on earlier CT abdomen. IMPRESSION: 1. Persistent evidence of small-bowel obstruction. 2. Stable rightward course of the nasogastric tube. As described on the earlier plain film report of 10/12/2019, this is an unusual course for a nasogastric tube but does correspond to the anatomy seen on recent CT abdomen related to the large paraesophageal hernia resulting in the stomach being located to the RIGHT of midline. Electronically Signed   By: Bary Richard M.D.   On: 10/13/2019 12:34         Scheduled Meds: . pantoprazole (PROTONIX) IV  40 mg Intravenous Q24H  . PARoxetine  40 mg Oral Daily  . sodium chloride flush  3 mL Intravenous Once   Continuous Infusions: . sodium chloride Stopped (10/14/19 1100)     LOS: 2 days    Time spent: 15 minutes    Tresa Moore, MD [facility] Pager [phone]  If 7PM-7AM, please contact night-coverage 10/14/2019, 1:12 PM

## 2019-10-15 LAB — CBC
HCT: 43 % (ref 36.0–46.0)
Hemoglobin: 14.5 g/dL (ref 12.0–15.0)
MCH: 31.2 pg (ref 26.0–34.0)
MCHC: 33.7 g/dL (ref 30.0–36.0)
MCV: 92.5 fL (ref 80.0–100.0)
Platelets: 228 10*3/uL (ref 150–400)
RBC: 4.65 MIL/uL (ref 3.87–5.11)
RDW: 13.1 % (ref 11.5–15.5)
WBC: 11.8 10*3/uL — ABNORMAL HIGH (ref 4.0–10.5)
nRBC: 0 % (ref 0.0–0.2)

## 2019-10-15 LAB — BASIC METABOLIC PANEL
Anion gap: 11 (ref 5–15)
BUN: 30 mg/dL — ABNORMAL HIGH (ref 8–23)
CO2: 24 mmol/L (ref 22–32)
Calcium: 8.9 mg/dL (ref 8.9–10.3)
Chloride: 107 mmol/L (ref 98–111)
Creatinine, Ser: 1 mg/dL (ref 0.44–1.00)
GFR calc Af Amer: >60 mL/min (ref >60)
GFR calc non Af Amer: 57 mL/min — ABNORMAL LOW (ref >60)
Glucose, Bld: 78 mg/dL (ref 70–99)
Potassium: 3.5 mmol/L (ref 3.5–5.1)
Sodium: 142 mmol/L (ref 135–145)

## 2019-10-15 MED ORDER — PANTOPRAZOLE SODIUM 40 MG PO TBEC: 40.0000 mg | DELAYED_RELEASE_TABLET | Freq: Every day | ORAL | Status: DC

## 2019-10-15 MED ORDER — FLUTICASONE PROPIONATE 50 MCG/ACT NA SUSP
1.0000 | Freq: Every day | NASAL | Status: DC | PRN
  Filled 2019-10-15: qty 16

## 2019-10-15 MED ORDER — HYDRALAZINE HCL 20 MG/ML IJ SOLN
10.0000 mg | INTRAMUSCULAR | Status: DC | PRN
  Administered 2019-10-15: 10 mg via INTRAVENOUS
  Filled 2019-10-15: qty 1

## 2019-10-15 MED ORDER — ENOXAPARIN SODIUM 40 MG/0.4ML ~~LOC~~ SOLN
40.0000 mg | SUBCUTANEOUS | Status: DC
  Administered 2019-10-15 – 2019-10-17 (×3): 40 mg via SUBCUTANEOUS
  Filled 2019-10-15 (×3): qty 0.4

## 2019-10-15 MED ORDER — ACETAMINOPHEN 500 MG PO TABS: 500.0000 mg | ORAL_TABLET | Freq: Three times a day (TID) | ORAL | Status: DC | PRN

## 2019-10-15 MED ORDER — LISINOPRIL 10 MG PO TABS
10.0000 mg | ORAL_TABLET | Freq: Every day | ORAL | Status: DC
  Administered 2019-10-15 – 2019-10-18 (×4): 10 mg via ORAL
  Filled 2019-10-15 (×4): qty 1

## 2019-10-15 NOTE — Progress Notes (Signed)
Mobility Specialist - Progress Note   10/15/19 1236  Mobility  Activity Ambulated in hall  Level of Assistance Standby assist, set-up cues, supervision of patient - no hands on  Assistive Device None (CGA for safety precautions)  Distance Ambulated (ft) 100 ft  Mobility Response Tolerated well  Mobility performed by Mobility specialist  $Mobility charge 1 Mobility    Pre-mobility: 92 HR, 159/83 BP, 93% SpO2 Post-mobility: 100 HR, 166/90 BP, 94% SpO2   Pt was in the bathroom upon arrival. Agreed to ambulate halls with mobility tech. Pt mentioned having pain in R leg but did not limit ability to ambulate. Pt was SBA with CGA for safety precautions during ambulation. Pt able to push her own IV pole during ambulation. At the end of session, pt c/o SOB but stated that she believed it to be d/t her feeling "anxious". Pt was left in bed with call bell and phone in reach. Nurse was notified of performance.   Filiberto Pinks Mobility Specialist 10/15/19, 12:48 PM

## 2019-10-15 NOTE — Plan of Care (Signed)
NG tube taken out. Tolerating CLD. Denies pain and nausea. Will continue to monitor.

## 2019-10-15 NOTE — Progress Notes (Addendum)
[facility] SURGICAL PROGRESS NOTE (cpt [phone])  Hospital Day(s): 3.   Interval History: Patient seen and examined, no acute events or new complaints overnight. Patient reports her biggest complaint is her hypertension this morning and states "it is because I haven't gotten my medicines since Thursday." When this happens she notices light chest discomfort and some SOB. Otherwise, she denied any abdominal pain, distension, nausea, emesis, fever or chills. Labs are reassuring this morning. No new imaging studies available. NGT output in the last 24 hours continues to slow, only 600 ccs. She continues to pass flatus and have BMs.  Review of Systems:  Constitutional: denies fever, chills  HEENT: denies cough or congestion  Respiratory: + shortness of breath  Cardiovascular: + chest pain, denied palpitations  Gastrointestinal: denies abdominal pain, N/V, or diarrhea/and bowel function as per interval history Genitourinary: denies burning with urination or urinary frequency   Vital signs in last 24 hours: [min-max] current  Temp:  [97.6 F (36.4 C)-97.8 F (36.6 C)] 97.8 F (36.6 C) (08/02 0630) Pulse Rate:  [77-98] 91 (08/02 0630) Resp:  [16-18] 18 (08/02 0630) BP: (145-184)/(73-87) 184/87 (08/02 0630) SpO2:  [93 %-96 %] 93 % (08/02 0630)     Height: 5\' 6"  (167.6 cm) Weight: 74.8 kg BMI (Calculated): 26.64   Intake/Output last 2 shifts:  08/01 0701 - 08/02 0700 In: 1670.9 [I.V.:1670.9] Out: 600 [Emesis/NG output:600]   Physical Exam:  Constitutional: alert, cooperative and no distress  HENT: normocephalic without obvious abnormality, NGT in place Eyes: PERRL, EOM's grossly intact and symmetric  Respiratory: breathing non-labored at rest, on [place] Cardiovascular: regular rate and sinus rhythm  Gastrointestinal: Soft, non-tender, and non-distended, no rebound/guarding, no appreciable umbilical/ventral defect or bulge this morning  Musculoskeletal: no edema or wounds, motor  and sensation grossly intact, NT    Labs:  CBC Latest Ref Rng & Units 10/15/2019 10/14/2019 10/13/2019  WBC 4.0 - 10.5 K/uL 11.8(H) 11.9(H) 13.9(H)  Hemoglobin 12.0 - 15.0 g/dL 10/15/2019 15.7(H) 15.0  Hematocrit 36 - 46 % 43.0 45.9 43.7  Platelets 150 - 400 K/uL 228 258 263   CMP Latest Ref Rng & Units 10/15/2019 10/14/2019 10/13/2019  Glucose 70 - 99 mg/dL 78 77 98  BUN 8 - 23 mg/dL 10/15/2019) 76(H) 20(N)  Creatinine 0.44 - 1.00 mg/dL 47(S 9.62) 8.36(O)  Sodium 135 - 145 mmol/L 142 143 140  Potassium 3.5 - 5.1 mmol/L 3.5 3.7 3.8  Chloride 98 - 111 mmol/L 107 105 102  CO2 22 - 32 mmol/L 24 25 25   Calcium 8.9 - 10.3 mg/dL 8.9 9.0 9.0  Total Protein 6.5 - 8.1 g/dL - - -  Total Bilirubin 0.3 - 1.2 mg/dL - - -  Alkaline Phos 38 - 126 U/L - - -  AST 15 - 41 U/L - - -  ALT 0 - 44 U/L - - -    Imaging studies: No new pertinent imaging studies   Assessment/Plan: (ICD-10's: K68.609) 69 y.o. female with clinically improved/resolved small bowel obstruction   - Will preform NGT clamping trial this morning x4 hours; if less than 150 ccs okay to remove and initiate diet  - Continue NPO + IVF resuscitation   - Monitor abdominal examination; on-going bowel function  - pain control prn; antiemetics prn  - no surgical intervention indicated  - mobilization encouraged  - medical management of comorbid conditions per medicine service; I am okay with resuming home medications when NGT out  All of the above findings and recommendations were  discussed with the patient, and the medical team, and all of patient's questions were answered to her expressed satisfaction.   -- Lynden Oxford, PA-C [facility] 10/15/2019, 7:39 AM [phone] M-F: 7am - 4pm  I saw and evaluated the patient.  I agree with the above documentation, exam, and plan, which I have edited where appropriate. Duanne Guess  9:50 AM

## 2019-10-15 NOTE — Care Management Important Message (Signed)
Important Message  Patient Details  Name: Katrina Sutton MRN: [id number] Date of Birth: December 04, 1950   Medicare Important Message Given:  Yes     Olegario Messier A Lachanda Buczek 10/15/2019, 1:26 PM

## 2019-10-15 NOTE — Progress Notes (Signed)
PROGRESS NOTE    Katrina Sutton  YKZ:[id number] DOB: [birth year] DOA: 10/11/2019 PCP: Tarri Fuller, FNP  Brief Narrative:  HPI: Katrina Sutton is a 69 y.o. female who reports a medical history significant for large paraesophageal hernia, ventral hernia status post repair 2 years ago, hypertension, depression, and anxiety, presenting to emergency department with several days of periumbilical abdominal pain, nausea, and vomiting.  Patient reports that she had a ventral hernia repair approximately 2 years ago but reports that the hernia returned.  She has some pain around this area, but no discoloration and no fevers or chills.  She reports chronic nocturnal hypoxia but denies any recent shortness of breath or cough.  She denies hematemesis, melena, or hematochezia.  7/31: Patient seen and examined.  Reports abdominal pain is improved but still persistent.  Remains mildly distended.  NG tube in place.  8/1: Patient seen and examined.  Endorses some small bowel movements and small passage of flatus.  1000 cc over 24 hours of NG tube.  8/2: Patient seen and examined.  NG tube clamped today per general surgery.  Potential for advancing diet and removing NGT today.  Patient overall stable however concerned about elevated blood pressure.  I assured her that we would restart her home medications when cleared by general surgery.  She is ambulating the hallway and looks overall much improved.   Assessment & Plan:   Principal Problem:   Partial small bowel obstruction (HCC) Active Problems:   Depression with anxiety   Umbilical hernia   Paraesophageal hernia  Partial SBO -Presents with periumbilical pain and N/V and has CT findings suggestive of ventral hernia with partial SBO in addition to the known large paraesophageal hernia -ED physician reviewed scan with surgery and medical admission was recommended -NGT was placed in ED -General surgery will clamp NG tube and consider removal and  advancement of diet today Plan: NG tube clamping trial today Continue maintenance IV fluids Continue n.p.o. status for now Surgery following in case patient needs operative intervention Ambulation Potential for diet advancement today  Paraesophageal hernia -Patient reports undergoing outpatient evaluation for possible repair but not sure that she wants to proceed with that -Continue PPI IV  Depression, anxiety -Restarted Klonopin and Paxil  Hypertension -Suboptimal control over interval We will add hydralazine IV as needed Restart home medications once diet advanced   DVT prophylaxis: Lovenox Code Status: Full Family Communication: None today Disposition Plan: Status is: Inpatient  Remains inpatient appropriate because:Inpatient level of care appropriate due to severity of illness   Dispo: The patient is from: Home              Anticipated d/c is to: Home              Anticipated d/c date is: 2 days              Patient currently is not medically stable to d/c.  Patient remains with NG tube in place.  Clamping trial today.  Possible NG tube removal today.  Possible diet advancement over the next 4 hours.  Disposition plan pending.  Anticipate patient should be able to return home      Consultants:   General surgery  Procedures:   NG tube  Antimicrobials:   None   Subjective: Patient seen and examined.  Expresses concern about blood pressure  Objective: Vitals:   10/15/19 0432 10/15/19 0630 10/15/19 0936 10/15/19 1338  BP:  (!) 184/87 (!) 161/96 (!) 151/69  Pulse:  98 91  (!) 105  Resp:  18  18  Temp:  97.8 F (36.6 C)  98 F (36.7 C)  TempSrc:  Oral  Oral  SpO2: 93% 93%  97%  Weight:      Height:        Intake/Output Summary (Last 24 hours) at 10/15/2019 1344 Last data filed at 10/15/2019 0709 Gross per 24 hour  Intake 1100.67 ml  Output 900 ml  Net 200.67 ml   Filed Weights   10/11/19 1724  Weight: 74.8 kg     Examination: General: No apparent distress, patient appears chronically ill HEENT: Normocephalic, atraumatic, NG tube in place Neck, supple, trachea midline, no tenderness Heart: Regular rate and rhythm, S1/S2 normal, no murmurs Lungs: Clear to auscultation bilaterally, no adventitious sounds, normal work of breathing Abdomen: Soft, mild tender to palpation, mild distention, hypoactive bowel sounds Extremities: Normal, atraumatic, no clubbing or cyanosis, normal muscle tone Skin: No rashes or lesions, normal color Neurologic: Cranial nerves grossly intact, sensation intact, alert and oriented x3 Psychiatric: Normal affect     Data Reviewed: I have personally reviewed following labs and imaging studies  CBC: Recent Labs  Lab 10/11/19 1726 10/12/19 0519 10/13/19 0550 10/14/19 0527 10/15/19 0400  WBC 11.7* 18.4* 13.9* 11.9* 11.8*  HGB 15.8* 16.5* 15.0 15.7* 14.5  HCT 44.8 48.3* 43.7 45.9 43.0  MCV 88.0 90.8 89.2 91.3 92.5  PLT 266 285 263 258 228   Basic Metabolic Panel: Recent Labs  Lab 10/11/19 1726 10/12/19 0519 10/13/19 0550 10/14/19 0527 10/15/19 0400  NA 140 138 140 143 142  K 4.0 4.3 3.8 3.7 3.5  CL 106 102 102 105 107  CO2 23 24 25 25 24   GLUCOSE 98 136* 98 77 78  BUN 10 18 26* 27* 30*  CREATININE 0.90 1.79* 1.42* 1.09* 1.00  CALCIUM 9.6 9.7 9.0 9.0 8.9   GFR: Estimated Creatinine Clearance: 53.4 mL/min (by C-G formula based on SCr of 1 mg/dL). Liver Function Tests: Recent Labs  Lab 10/11/19 1726  AST 18  ALT 14  ALKPHOS 113  BILITOT 1.3*  PROT 7.6  ALBUMIN 4.6   Recent Labs  Lab 10/11/19 1726  LIPASE 63*   No results for input(s): AMMONIA in the last 168 hours. Coagulation Profile: No results for input(s): INR, PROTIME in the last 168 hours. Cardiac Enzymes: No results for input(s): CKTOTAL, CKMB, CKMBINDEX, TROPONINI in the last 168 hours. BNP (last 3 results) No results for input(s): PROBNP in the last 8760 hours. HbA1C: No  results for input(s): HGBA1C in the last 72 hours. CBG: No results for input(s): GLUCAP in the last 168 hours. Lipid Profile: No results for input(s): CHOL, HDL, LDLCALC, TRIG, CHOLHDL, LDLDIRECT in the last 72 hours. Thyroid Function Tests: No results for input(s): TSH, T4TOTAL, FREET4, T3FREE, THYROIDAB in the last 72 hours. Anemia Panel: No results for input(s): VITAMINB12, FOLATE, FERRITIN, TIBC, IRON, RETICCTPCT in the last 72 hours. Sepsis Labs: No results for input(s): PROCALCITON, LATICACIDVEN in the last 168 hours.  Recent Results (from the past 240 hour(s))  SARS Coronavirus 2 by RT PCR (hospital order, performed in [facility] hospital lab) Nasopharyngeal Nasopharyngeal Swab     Status: None   Collection Time: 10/12/19  1:47 AM   Specimen: Nasopharyngeal Swab  Result Value Ref Range Status   SARS Coronavirus 2 NEGATIVE NEGATIVE Final    Comment: (NOTE) SARS-CoV-2 target nucleic acids are NOT DETECTED.  The SARS-CoV-2 RNA is generally detectable in upper and lower  respiratory specimens during the acute phase of infection. The lowest concentration of SARS-CoV-2 viral copies this assay can detect is 250 copies / mL. A negative result does not preclude SARS-CoV-2 infection and should not be used as the sole basis for treatment or other patient management decisions.  A negative result may occur with improper specimen collection / handling, submission of specimen other than nasopharyngeal swab, presence of viral mutation(s) within the areas targeted by this assay, and inadequate number of viral copies (<250 copies / mL). A negative result must be combined with clinical observations, patient history, and epidemiological information.  Fact Sheet for Patients:   BoilerBrush.com.cy  Fact Sheet for Healthcare Providers: https://pope.com/  This test is not yet approved or  cleared by the [place] FDA and has been authorized  for detection and/or diagnosis of SARS-CoV-2 by FDA under an Emergency Use Authorization (EUA).  This EUA will remain in effect (meaning this test can be used) for the duration of the COVID-19 declaration under Section 564(b)(1) of the Act, 21 U.S.C. section 360bbb-3(b)(1), unless the authorization is terminated or revoked sooner.  Performed at [facility], [address], [place], [place] [address]          Radiology Studies: DG Abd 1 View  Result Date: 10/14/2019 CLINICAL DATA:  Follow-up small bowel obstruction. EXAM: ABDOMEN - 1 VIEW COMPARISON:  10/13/2019 and earlier, including CT abdomen and pelvis 10/12/2019. FINDINGS: Improvement in the partial small bowel obstruction since yesterday, though there are scattered persistent mildly distended loops of small bowel in the mid and upper abdomen. Gas and high attenuation stool within normal caliber ascending and transverse colon. No suggestion of free air on the supine image. Surgical clips in the RIGHT UPPER QUADRANT from prior cholecystectomy. Phleboliths in the LEFT side of the pelvis. IMPRESSION: Improvement in the partial small bowel obstruction since yesterday, though there are persistent mildly distended loops of small bowel in the mid and upper abdomen. Electronically Signed   By: Hulan Saas M.D.   On: 10/14/2019 12:31        Scheduled Meds: . pantoprazole (PROTONIX) IV  40 mg Intravenous Q24H  . PARoxetine  40 mg Oral Daily  . sodium chloride flush  3 mL Intravenous Once   Continuous Infusions: . sodium chloride 75 mL/hr at 10/15/19 1222     LOS: 3 days    Time spent: 15 minutes    Tresa Moore, MD [facility] Pager [phone]  If 7PM-7AM, please contact night-coverage 10/15/2019, 1:44 PM

## 2019-10-16 LAB — BASIC METABOLIC PANEL
Anion gap: 11 (ref 5–15)
BUN: 23 mg/dL (ref 8–23)
CO2: 23 mmol/L (ref 22–32)
Calcium: 8.8 mg/dL — ABNORMAL LOW (ref 8.9–10.3)
Chloride: 106 mmol/L (ref 98–111)
Creatinine, Ser: 0.91 mg/dL (ref 0.44–1.00)
GFR calc Af Amer: >60 mL/min (ref >60)
GFR calc non Af Amer: >60 mL/min (ref >60)
Glucose, Bld: 81 mg/dL (ref 70–99)
Potassium: 3.3 mmol/L — ABNORMAL LOW (ref 3.5–5.1)
Sodium: 140 mmol/L (ref 135–145)

## 2019-10-16 LAB — CBC WITH DIFFERENTIAL/PLATELET
Abs Immature Granulocytes: 0.04 10*3/uL (ref 0.00–0.07)
Basophils Absolute: 0 10*3/uL (ref 0.0–0.1)
Basophils Relative: 0 %
Eosinophils Absolute: 0 10*3/uL (ref 0.0–0.5)
Eosinophils Relative: 0 %
HCT: 41.6 % (ref 36.0–46.0)
Hemoglobin: 14.4 g/dL (ref 12.0–15.0)
Immature Granulocytes: 0 %
Lymphocytes Relative: 10 %
Lymphs Abs: 1 10*3/uL (ref 0.7–4.0)
MCH: 30.7 pg (ref 26.0–34.0)
MCHC: 34.6 g/dL (ref 30.0–36.0)
MCV: 88.7 fL (ref 80.0–100.0)
Monocytes Absolute: 0.7 10*3/uL (ref 0.1–1.0)
Monocytes Relative: 8 %
Neutro Abs: 7.8 10*3/uL — ABNORMAL HIGH (ref 1.7–7.7)
Neutrophils Relative %: 82 %
Platelets: 216 10*3/uL (ref 150–400)
RBC: 4.69 MIL/uL (ref 3.87–5.11)
RDW: 13.2 % (ref 11.5–15.5)
WBC: 9.6 10*3/uL (ref 4.0–10.5)
nRBC: 0 % (ref 0.0–0.2)

## 2019-10-16 LAB — MAGNESIUM: Magnesium: 1.7 mg/dL (ref 1.7–2.4)

## 2019-10-16 MED ORDER — CLONAZEPAM 1 MG PO TABS
1.0000 mg | ORAL_TABLET | Freq: Two times a day (BID) | ORAL | Status: DC | PRN
  Administered 2019-10-16 – 2019-10-18 (×4): 1 mg via ORAL
  Filled 2019-10-16 (×4): qty 1

## 2019-10-16 MED ORDER — BOOST / RESOURCE BREEZE PO LIQD CUSTOM
1.0000 | Freq: Three times a day (TID) | ORAL | Status: DC
  Administered 2019-10-16 (×2): 1 via ORAL

## 2019-10-16 NOTE — Progress Notes (Signed)
PROGRESS NOTE    Katrina Sutton  RDE:[id number] DOB: November 17, 1950 DOA: 10/11/2019 PCP: Tarri Fuller, FNP  Brief Narrative:  HPI: Katrina Sutton is a 70 y.o. female who reports a medical history significant for large paraesophageal hernia, ventral hernia status post repair 2 years ago, hypertension, depression, and anxiety, presenting to emergency department with several days of periumbilical abdominal pain, nausea, and vomiting.  Patient reports that she had a ventral hernia repair approximately 2 years ago but reports that the hernia returned.  She has some pain around this area, but no discoloration and no fevers or chills.  She reports chronic nocturnal hypoxia but denies any recent shortness of breath or cough.  She denies hematemesis, melena, or hematochezia.  7/31: Patient seen and examined.  Reports abdominal pain is improved but still persistent.  Remains mildly distended.  NG tube in place.  8/1: Patient seen and examined.  Endorses some small bowel movements and small passage of flatus.  1000 cc over 24 hours of NG tube.  8/2: Patient seen and examined.  NG tube clamped today per general surgery.  Potential for advancing diet and removing NGT today.  Patient overall stable however concerned about elevated blood pressure.  I assured her that we would restart her home medications when cleared by general surgery.  She is ambulating the hallway and looks overall much improved.  8/3: Patient seen and examined.  NG tube removed.  Diet advanced to clears yesterday.  Patient tolerated well.  Did endorse some nausea overnight.  Attributes to her dissolving Klonopin which she inadvertently swallowed.  Having bowel movements.  Passing flatus.    Assessment & Plan:   Principal Problem:   Partial small bowel obstruction (HCC) Active Problems:   Depression with anxiety   Umbilical hernia   Paraesophageal hernia  Partial SBO -Presents with periumbilical pain and N/V and has CT findings suggestive  of ventral hernia with partial SBO in addition to the known large paraesophageal hernia -ED physician reviewed scan with surgery and medical admission was recommended -NGT was placed in ED -NG tube removed 8/2, diet advanced to clears Plan: Per general surgery will continue CLD for now, can advance as tolerated if she does well this afternoon We will DC maintenance fluids Surgery following in case patient needs operative intervention, not anticipated Ambulation Potential for further diet advancement today  Paraesophageal hernia -Patient reports undergoing outpatient evaluation for possible repair but not sure that she wants to proceed with that -Continue PPI IV  Depression, anxiety -Restarted Klonopin and Paxil  Hypertension Improved control over interval Home lisinopril restarted As needed IV hydralazine   DVT prophylaxis: Lovenox Code Status: Full Family Communication: None today Disposition Plan: Status is: Inpatient  Remains inpatient appropriate because:Inpatient level of care appropriate due to severity of illness   Dispo: The patient is from: Home              Anticipated d/c is to: Home              Anticipated d/c date is: 2 days              Patient currently is not medically stable to d/c. NG tube removed yesterday.  Diet advanced to clears.  Potential for discharge within the next 24 hours if patient tolerates clears and can be advanced further.      Consultants:   General surgery  Procedures:   NG tube  Antimicrobials:   None   Subjective: Patient seen and  examined.  Complains of nausea and reflux.  Objective: Vitals:   10/15/19 0936 10/15/19 1338 10/15/19 1949 10/16/19 0442  BP: (!) 161/96 (!) 151/69 (!) 142/82 125/74  Pulse:  (!) 105 88 85  Resp:  18 18 19   Temp:  98 F (36.7 C) (!) 95.7 F (35.4 C) 97.8 F (36.6 C)  TempSrc:  Oral Oral   SpO2:  97% 95% 93%  Weight:      Height:        Intake/Output Summary (Last  24 hours) at 10/16/2019 1014 Last data filed at 10/16/2019 0900 Gross per 24 hour  Intake 1775.72 ml  Output --  Net 1775.72 ml   Filed Weights   10/11/19 1724  Weight: 74.8 kg    Examination: General: No apparent distress, patient appears weak HEENT: Normocephalic, atraumatic Neck, supple, trachea midline, no tenderness Heart: Regular rate and rhythm, S1/S2 normal, no murmurs Lungs: Clear to auscultation bilaterally, no adventitious sounds, normal work of breathing Abdomen: Soft.  Nondistended.  Positive bowel sounds Extremities: Normal, atraumatic, no clubbing or cyanosis, normal muscle tone Skin: No rashes or lesions, normal color Neurologic: Cranial nerves grossly intact, sensation intact, alert and oriented x3 Psychiatric: Normal affect      Data Reviewed: I have personally reviewed following labs and imaging studies  CBC: Recent Labs  Lab 10/12/19 0519 10/13/19 0550 10/14/19 0527 10/15/19 0400 10/16/19 0741  WBC 18.4* 13.9* 11.9* 11.8* 9.6  NEUTROABS  --   --   --   --  7.8*  HGB 16.5* 15.0 15.7* 14.5 14.4  HCT 48.3* 43.7 45.9 43.0 41.6  MCV 90.8 89.2 91.3 92.5 88.7  PLT 285 263 258 228 216   Basic Metabolic Panel: Recent Labs  Lab 10/12/19 0519 10/13/19 0550 10/14/19 0527 10/15/19 0400 10/16/19 0741  NA 138 140 143 142 140  K 4.3 3.8 3.7 3.5 3.3*  CL 102 102 105 107 106  CO2 24 25 25 24 23   GLUCOSE 136* 98 77 78 81  BUN 18 26* 27* 30* 23  CREATININE 1.79* 1.42* 1.09* 1.00 0.91  CALCIUM 9.7 9.0 9.0 8.9 8.8*  MG  --   --   --   --  1.7   GFR: Estimated Creatinine Clearance: 58.6 mL/min (by C-G formula based on SCr of 0.91 mg/dL). Liver Function Tests: Recent Labs  Lab 10/11/19 1726  AST 18  ALT 14  ALKPHOS 113  BILITOT 1.3*  PROT 7.6  ALBUMIN 4.6   Recent Labs  Lab 10/11/19 1726  LIPASE 63*   No results for input(s): AMMONIA in the last 168 hours. Coagulation Profile: No results for input(s): INR, PROTIME in the last 168  hours. Cardiac Enzymes: No results for input(s): CKTOTAL, CKMB, CKMBINDEX, TROPONINI in the last 168 hours. BNP (last 3 results) No results for input(s): PROBNP in the last 8760 hours. HbA1C: No results for input(s): HGBA1C in the last 72 hours. CBG: No results for input(s): GLUCAP in the last 168 hours. Lipid Profile: No results for input(s): CHOL, HDL, LDLCALC, TRIG, CHOLHDL, LDLDIRECT in the last 72 hours. Thyroid Function Tests: No results for input(s): TSH, T4TOTAL, FREET4, T3FREE, THYROIDAB in the last 72 hours. Anemia Panel: No results for input(s): VITAMINB12, FOLATE, FERRITIN, TIBC, IRON, RETICCTPCT in the last 72 hours. Sepsis Labs: No results for input(s): PROCALCITON, LATICACIDVEN in the last 168 hours.  Recent Results (from the past 240 hour(s))  SARS Coronavirus 2 by RT PCR (hospital order, performed in [facility] hospital lab) Nasopharyngeal Nasopharyngeal  Swab     Status: None   Collection Time: 10/12/19  1:47 AM   Specimen: Nasopharyngeal Swab  Result Value Ref Range Status   SARS Coronavirus 2 NEGATIVE NEGATIVE Final    Comment: (NOTE) SARS-CoV-2 target nucleic acids are NOT DETECTED.  The SARS-CoV-2 RNA is generally detectable in upper and lower respiratory specimens during the acute phase of infection. The lowest concentration of SARS-CoV-2 viral copies this assay can detect is 250 copies / mL. A negative result does not preclude SARS-CoV-2 infection and should not be used as the sole basis for treatment or other patient management decisions.  A negative result may occur with improper specimen collection / handling, submission of specimen other than nasopharyngeal swab, presence of viral mutation(s) within the areas targeted by this assay, and inadequate number of viral copies (<250 copies / mL). A negative result must be combined with clinical observations, patient history, and epidemiological information.  Fact Sheet for Patients:    BoilerBrush.com.cy  Fact Sheet for Healthcare Providers: https://pope.com/  This test is not yet approved or  cleared by the [place] FDA and has been authorized for detection and/or diagnosis of SARS-CoV-2 by FDA under an Emergency Use Authorization (EUA).  This EUA will remain in effect (meaning this test can be used) for the duration of the COVID-19 declaration under Section 564(b)(1) of the Act, 21 U.S.C. section 360bbb-3(b)(1), unless the authorization is terminated or revoked sooner.  Performed at [facility], [address] [place], [place] [address]          Radiology Studies: DG Abd 1 View  Result Date: 10/14/2019 CLINICAL DATA:  Follow-up small bowel obstruction. EXAM: ABDOMEN - 1 VIEW COMPARISON:  10/13/2019 and earlier, including CT abdomen and pelvis 10/12/2019. FINDINGS: Improvement in the partial small bowel obstruction since yesterday, though there are scattered persistent mildly distended loops of small bowel in the mid and upper abdomen. Gas and high attenuation stool within normal caliber ascending and transverse colon. No suggestion of free air on the supine image. Surgical clips in the RIGHT UPPER QUADRANT from prior cholecystectomy. Phleboliths in the LEFT side of the pelvis. IMPRESSION: Improvement in the partial small bowel obstruction since yesterday, though there are persistent mildly distended loops of small bowel in the mid and upper abdomen. Electronically Signed   By: Hulan Saas M.D.   On: 10/14/2019 12:31        Scheduled Meds: . enoxaparin (LOVENOX) injection  40 mg Subcutaneous Q24H  . feeding supplement  1 Container Oral TID BM  . lisinopril  10 mg Oral Daily  . pantoprazole (PROTONIX) IV  40 mg Intravenous Q24H  . PARoxetine  40 mg Oral Daily  . sodium chloride flush  3 mL Intravenous Once   Continuous Infusions: . sodium chloride 75 mL/hr at 10/16/19 0100     LOS: 4  days    Time spent: 15 minutes    Tresa Moore, MD [facility] Pager [phone]  If 7PM-7AM, please contact night-coverage 10/16/2019, 10:14 AM

## 2019-10-16 NOTE — Progress Notes (Signed)
Mobility Specialist - Progress Note   10/16/19 1226  Mobility  Activity Ambulated in room;Ambulated in hall  Level of Assistance Modified independent, requires aide device or extra time (CGA for safety precautions)  Assistive Device None (pushed IV pole)  Distance Ambulated (ft) 200 ft  Mobility Response Tolerated well  Mobility performed by Mobility specialist  $Mobility charge 1 Mobility    Pre-mobility: 88 HR, 116/73 BP, 95% SpO2 Post-mobility: 103 HR, 137/79 BP, 92% SpO2   Pt sitting EOB upon arrival. Pt expressed she was feeling "anxious", but was willing to participate in mobility session. Pt able to ambulate 200' total mod. Independent pushing her own IV pole, with CGA for safety precautions. Pt tolerated session well. Pt left in bed with call bell/phone in reach. Nurse was notified.    Addison Freimuth Mobility Specialist  10/16/19, 12:30 PM

## 2019-10-16 NOTE — Progress Notes (Signed)
[facility] SURGICAL PROGRESS NOTE (cpt [phone])  Hospital Day(s): 4.   Interval History: Patient seen and examined, NGT removed yesterday afternoon. Patient reports that she had a lot of nausea overnight but attributes this to the acidity of her PO intake but feels better after anitemetics, denies fever, chills, emesis, or abdominal pain. Labs are pending this morning. She was advanced to CLD yesterday afternoon. She is tolerating this well. She continues to have multiple bowel movements and flatus. No other complaints this morning.   Review of Systems:  Constitutional: denies fever, chills  HEENT: denies cough or congestion  Respiratory: denies any shortness of breath  Cardiovascular: denies chest pain or palpitations  Gastrointestinal: + nausea (improved), denies abdominal pain, Vomitting, or diarrhea/and bowel function as per interval history Genitourinary: denies burning with urination or urinary frequency  Vital signs in last 24 hours: [min-max] current  Temp:  [95.7 F (35.4 C)-98 F (36.7 C)] 97.8 F (36.6 C) (08/03 0442) Pulse Rate:  [85-105] 85 (08/03 0442) Resp:  [18-19] 19 (08/03 0442) BP: (125-161)/(69-96) 125/74 (08/03 0442) SpO2:  [93 %-97 %] 93 % (08/03 0442)     Height: 5\' 6"  (167.6 cm) Weight: 74.8 kg BMI (Calculated): 26.64   Intake/Output last 2 shifts:  08/02 0701 - 08/03 0700 In: 1535.7 [I.V.:1535.7] Out: 300 [Emesis/NG output:300]   Physical Exam:  Constitutional: alert, cooperative and no distress  HENT: normocephalic without obvious abnormality Eyes: PERRL, EOM's grossly intact and symmetric  Respiratory: breathing non-labored at rest, on [place] Cardiovascular: regular rate and sinus rhythm  Gastrointestinal: Soft, non-tender, and non-distended, no rebound/guarding, no appreciable umbilical/ventral defect or bulge this morning  Musculoskeletal: no edema or wounds, motor and sensation grossly intact, NT     Labs:  CBC Latest Ref Rng & Units  10/15/2019 10/14/2019 10/13/2019  WBC 4.0 - 10.5 K/uL 11.8(H) 11.9(H) 13.9(H)  Hemoglobin 12.0 - 15.0 g/dL 10/15/2019 15.7(H) 15.0  Hematocrit 36 - 46 % 43.0 45.9 43.7  Platelets 150 - 400 K/uL 228 258 263   CMP Latest Ref Rng & Units 10/15/2019 10/14/2019 10/13/2019  Glucose 70 - 99 mg/dL 78 77 98  BUN 8 - 23 mg/dL 10/15/2019) 24(M) 35(T)  Creatinine 0.44 - 1.00 mg/dL 61(W 4.31) 5.40(G)  Sodium 135 - 145 mmol/L 142 143 140  Potassium 3.5 - 5.1 mmol/L 3.5 3.7 3.8  Chloride 98 - 111 mmol/L 107 105 102  CO2 22 - 32 mmol/L 24 25 25   Calcium 8.9 - 10.3 mg/dL 8.9 9.0 9.0  Total Protein 6.5 - 8.1 g/dL - - -  Total Bilirubin 0.3 - 1.2 mg/dL - - -  Alkaline Phos 38 - 126 U/L - - -  AST 15 - 41 U/L - - -  ALT 0 - 44 U/L - - -    Imaging studies: No new pertinent imaging studies   Assessment/Plan: (ICD-10's: K67.609) 69 y.o. female with clinically improved/resolved small bowel obstruction. She does have a quite large hiatal hernia which may be contributing to her degree of nausea.    - Continue CLD for now. If she is feeling better later today we can begin to advance diet further  - Monitor abdominal examination; on-going bowel function             - pain control prn; antiemetics prn             - no surgical intervention indicated             - mobilization encouraged             -  medical management of comorbid conditions per medicine service; we will follow for now to ensure improvement  All of the above findings and recommendations were discussed with the patient, and the medical team, and all of patient's questions were answered to her expressed satisfaction.  -- Lynden Oxford, PA-C [facility] 10/16/2019, 7:34 AM [phone] M-F: 7am - 4pm

## 2019-10-17 MED ORDER — PANTOPRAZOLE SODIUM 40 MG PO TBEC
40.0000 mg | DELAYED_RELEASE_TABLET | Freq: Every day | ORAL | Status: DC
  Administered 2019-10-18: 40 mg via ORAL
  Filled 2019-10-17: qty 1

## 2019-10-17 MED ORDER — POLYETHYLENE GLYCOL 3350 17 G PO PACK
34.0000 g | PACK | ORAL | Status: AC
  Administered 2019-10-17 (×3): 34 g via ORAL
  Filled 2019-10-17 (×3): qty 2

## 2019-10-17 MED ORDER — POTASSIUM CHLORIDE CRYS ER 20 MEQ PO TBCR
40.0000 meq | EXTENDED_RELEASE_TABLET | Freq: Once | ORAL | Status: AC
  Administered 2019-10-17: 40 meq via ORAL
  Filled 2019-10-17: qty 2

## 2019-10-17 NOTE — Progress Notes (Signed)
PROGRESS NOTE    Noralyn T Humiston  EVO:[id number] DOB: 06-Jun-1950 DOA: 10/11/2019 PCP: Tarri Fuller, FNP  Brief Narrative:  HPI: Katrina Sutton is a 69 y.o. Caucasian female who reports a medical history significant for large paraesophageal hernia, ventral hernia status post repair 2 years ago, hypertension, depression, and anxiety, presenting to emergency department with several days of periumbilical abdominal pain, nausea, and vomiting.  Patient reports that she had a ventral hernia repair approximately 2 years ago but reports that the hernia returned.  She has some pain around this area, but no discoloration and no fevers or chills.  She reports chronic nocturnal hypoxia but denies any recent shortness of breath or cough.  She denies hematemesis, melena, or hematochezia.  7/31: Patient seen and examined.  Reports abdominal pain is improved but still persistent.  Remains mildly distended.  NG tube in place.  8/1: Patient seen and examined.  Endorses some small bowel movements and small passage of flatus.  1000 cc over 24 hours of NG tube.  8/2: Patient seen and examined.  NG tube clamped today per general surgery.  Potential for advancing diet and removing NGT today.  Patient overall stable however concerned about elevated blood pressure.  I assured her that we would restart her home medications when cleared by general surgery.  She is ambulating the hallway and looks overall much improved.  8/3: Patient seen and examined.  NG tube removed.  Diet advanced to clears yesterday.  Patient tolerated well.  Did endorse some nausea overnight.  Attributes to her dissolving Klonopin which she inadvertently swallowed.  Having bowel movements.  Passing flatus.    Assessment & Plan:   Principal Problem:   Partial small bowel obstruction (HCC) Active Problems:   Depression with anxiety   Umbilical hernia   Paraesophageal hernia  Partial SBO -Presents with periumbilical pain and N/V and has CT findings  suggestive of ventral hernia with partial SBO in addition to the known large paraesophageal hernia -ED physician reviewed scan with surgery and medical admission was recommended -NGT was placed in ED -NG tube removed 8/2, diet advanced to clears Plan: --advance to soft diet today per GenSurg --Hold MIVF --High-dose Miralax for possible constipation  Paraesophageal hernia -Patient reports undergoing outpatient evaluation for possible repair but not sure that she wants to proceed with that -Continue PPIas oral  Depression, anxiety -continue Klonopin and Paxil  Hypertension Improved control over interval Continue home lisinopril  As needed IV hydralazine   DVT prophylaxis: Lovenox Code Status: Full Family Communication:  Disposition Plan: Status is: Inpatient  Remains inpatient appropriate because:Inpatient level of care appropriate due to severity of illness   Dispo: The patient is from: Home              Anticipated d/c is to: Home              Anticipated d/c date is: 1 day              Patient currently is not medically stable to d/c. Diet advanced to soft today.  Still having some abdominal pain with oral intake.   Consultants:   General surgery  Procedures:   NG tube  Antimicrobials:   None   Subjective: Pt reported feeling bloated after eating solids.  Had not had stool for a week.  No N/V.   Objective: Vitals:   10/16/19 1934 10/17/19 0406 10/17/19 1038 10/17/19 1151  BP: 125/74 116/64 107/61 117/72  Pulse: 85 63  85  Resp: 17 16  18   Temp: 97.7 F (36.5 C) 98.1 F (36.7 C)  (!) 97.5 F (36.4 C)  TempSrc: Oral Oral  Oral  SpO2: 96%   92%  Weight:      Height:        Intake/Output Summary (Last 24 hours) at 10/17/2019 1611 Last data filed at 10/17/2019 0959 Gross per 24 hour  Intake 600 ml  Output --  Net 600 ml   Filed Weights   10/11/19 1724  Weight: 74.8 kg    Examination: Constitutional: NAD, AAOx3 HEENT:  conjunctivae and lids normal, EOMI CV: RRR no M,R,G. Distal pulses +2.  No cyanosis.   RESP: CTA B/L, normal respiratory effort  GI: +BS, NTND, soft Extremities: No effusions, edema, or tenderness in BLE SKIN: warm, dry and intact Neuro: II - XII grossly intact.  Sensation intact Psych: Normal mood and affect.  Appropriate judgement and reason    Data Reviewed: I have personally reviewed following labs and imaging studies  CBC: Recent Labs  Lab 10/12/19 0519 10/13/19 0550 10/14/19 0527 10/15/19 0400 10/16/19 0741  WBC 18.4* 13.9* 11.9* 11.8* 9.6  NEUTROABS  --   --   --   --  7.8*  HGB 16.5* 15.0 15.7* 14.5 14.4  HCT 48.3* 43.7 45.9 43.0 41.6  MCV 90.8 89.2 91.3 92.5 88.7  PLT 285 263 258 228 216   Basic Metabolic Panel: Recent Labs  Lab 10/12/19 0519 10/13/19 0550 10/14/19 0527 10/15/19 0400 10/16/19 0741  NA 138 140 143 142 140  K 4.3 3.8 3.7 3.5 3.3*  CL 102 102 105 107 106  CO2 24 25 25 24 23   GLUCOSE 136* 98 77 78 81  BUN 18 26* 27* 30* 23  CREATININE 1.79* 1.42* 1.09* 1.00 0.91  CALCIUM 9.7 9.0 9.0 8.9 8.8*  MG  --   --   --   --  1.7   GFR: Estimated Creatinine Clearance: 58.6 mL/min (by C-G formula based on SCr of 0.91 mg/dL). Liver Function Tests: Recent Labs  Lab 10/11/19 1726  AST 18  ALT 14  ALKPHOS 113  BILITOT 1.3*  PROT 7.6  ALBUMIN 4.6   Recent Labs  Lab 10/11/19 1726  LIPASE 63*   No results for input(s): AMMONIA in the last 168 hours. Coagulation Profile: No results for input(s): INR, PROTIME in the last 168 hours. Cardiac Enzymes: No results for input(s): CKTOTAL, CKMB, CKMBINDEX, TROPONINI in the last 168 hours. BNP (last 3 results) No results for input(s): PROBNP in the last 8760 hours. HbA1C: No results for input(s): HGBA1C in the last 72 hours. CBG: No results for input(s): GLUCAP in the last 168 hours. Lipid Profile: No results for input(s): CHOL, HDL, LDLCALC, TRIG, CHOLHDL, LDLDIRECT in the last 72 hours. Thyroid  Function Tests: No results for input(s): TSH, T4TOTAL, FREET4, T3FREE, THYROIDAB in the last 72 hours. Anemia Panel: No results for input(s): VITAMINB12, FOLATE, FERRITIN, TIBC, IRON, RETICCTPCT in the last 72 hours. Sepsis Labs: No results for input(s): PROCALCITON, LATICACIDVEN in the last 168 hours.  Recent Results (from the past 240 hour(s))  SARS Coronavirus 2 by RT PCR (hospital order, performed in [facility] hospital lab) Nasopharyngeal Nasopharyngeal Swab     Status: None   Collection Time: 10/12/19  1:47 AM   Specimen: Nasopharyngeal Swab  Result Value Ref Range Status   SARS Coronavirus 2 NEGATIVE NEGATIVE Final    Comment: (NOTE) SARS-CoV-2 target nucleic acids are NOT DETECTED.  The SARS-CoV-2 RNA is  generally detectable in upper and lower respiratory specimens during the acute phase of infection. The lowest concentration of SARS-CoV-2 viral copies this assay can detect is 250 copies / mL. A negative result does not preclude SARS-CoV-2 infection and should not be used as the sole basis for treatment or other patient management decisions.  A negative result may occur with improper specimen collection / handling, submission of specimen other than nasopharyngeal swab, presence of viral mutation(s) within the areas targeted by this assay, and inadequate number of viral copies (<250 copies / mL). A negative result must be combined with clinical observations, patient history, and epidemiological information.  Fact Sheet for Patients:   BoilerBrush.com.cy  Fact Sheet for Healthcare Providers: https://pope.com/  This test is not yet approved or  cleared by the [place] FDA and has been authorized for detection and/or diagnosis of SARS-CoV-2 by FDA under an Emergency Use Authorization (EUA).  This EUA will remain in effect (meaning this test can be used) for the duration of the COVID-19 declaration under Section 564(b)(1)  of the Act, 21 U.S.C. section 360bbb-3(b)(1), unless the authorization is terminated or revoked sooner.  Performed at [facility], [address], [place], [place] [address]          Radiology Studies: No results found.      Scheduled Meds:  enoxaparin (LOVENOX) injection  40 mg Subcutaneous Q24H   feeding supplement  1 Container Oral TID BM   lisinopril  10 mg Oral Daily   pantoprazole (PROTONIX) IV  40 mg Intravenous Q24H   PARoxetine  40 mg Oral Daily   sodium chloride flush  3 mL Intravenous Once   Continuous Infusions:    LOS: 5 days   Darlin Priestly, MD [facility] Pager [phone]  If 7PM-7AM, please contact night-coverage 10/17/2019, 4:11 PM

## 2019-10-17 NOTE — Progress Notes (Addendum)
[facility]ESS [facility] (cpt [phone])  Hospital Day(s): 5.   Interval History: Patient seen and examined, no acute events or new complaints overnight. Patient reports she is doing great this morning, denies abdominal pain, distension, nausea, emesis. No new labs or imaging this morning. She was advanced to full liquids yesterday afternoon and has tolerated these well. She continues to pass flatus and have multiple BMs. No other issues.   Review of Systems:  Constitutional: denies fever, chills  HEENT: denies cough or congestion  Respiratory: denies any shortness of breath  Cardiovascular: denies chest pain or palpitations  Gastrointestinal: denies abdominal pain, N/V, or diarrhea/and bowel function as per interval history Genitourinary: denies burning with urination or urinary frequency   Vital signs in last 24 hours: [min-max] current  Temp:  [97.7 F (36.5 C)-98.2 F (36.8 C)] 98.1 F (36.7 C) (08/04 0406) Pulse Rate:  [63-91] 63 (08/04 0406) Resp:  [16-20] 16 (08/04 0406) BP: (116-137)/(64-88) 116/64 (08/04 0406) SpO2:  [91 %-96 %] 96 % (08/03 1934)     Height: 5\' 6"  (167.6 cm) Weight: 74.8 kg BMI (Calculated): 26.64   Intake/Output last 2 shifts:  08/03 0701 - 08/04 0700 In: 1140 [P.O.:1140] Out: 0    Physical Exam:  Constitutional: alert, cooperative and no distress  HENT: normocephalic without obvious abnormality Eyes: PERRL, EOM's grossly intact and symmetric  Respiratory: breathing non-labored at rest, on [place] Cardiovascular: regular rate and sinus rhythm  Gastrointestinal: Soft, non-tender, and non-distended, no rebound/guarding, no appreciable umbilical/ventral defect or bulge this morning  Musculoskeletal: no edema or wounds, motor and sensation grossly intact, NT     Labs:  CBC Latest Ref Rng & Units 10/16/2019 10/15/2019 10/14/2019  WBC 4.0 - 10.5 K/uL 9.6 11.8(H) 11.9(H)  Hemoglobin 12.0 - 15.0 g/dL 12/14/2019 94.4 15.7(H)  Hematocrit 36 - 46 %  41.6 43.0 45.9  Platelets 150 - 400 K/uL 216 228 258   CMP Latest Ref Rng & Units 10/16/2019 10/15/2019 10/14/2019  Glucose 70 - 99 mg/dL 81 78 77  BUN 8 - 23 mg/dL 23 12/14/2019) 59(F)  Creatinine 0.44 - 1.00 mg/dL 63(W 4.66 5.99)  Sodium 135 - 145 mmol/L 140 142 143  Potassium 3.5 - 5.1 mmol/L 3.3(L) 3.5 3.7  Chloride 98 - 111 mmol/L 106 107 105  CO2 22 - 32 mmol/L 23 24 25   Calcium 8.9 - 10.3 mg/dL 3.57(S) 8.9 9.0  Total Protein 6.5 - 8.1 g/dL - - -  Total Bilirubin 0.3 - 1.2 mg/dL - - -  Alkaline Phos 38 - 126 U/L - - -  AST 15 - 41 U/L - - -  ALT 0 - 44 U/L - - -     Imaging studies: No new pertinent imaging studies   Assessment/Plan: (ICD-10's: K81.609) 69 y.o. female with clinically improved/resolved small bowel obstruction.   - Advance to soft diet  - Monitor abdominal examination; on-going bowel function             - pain control prn; antiemetics prn             - no surgical intervention indicated             - mobilization encouraged             - medical management of comorbid conditions per medicine service   - SBO resolved; general surgery will sign off; discharge per medicine service. She saw Dr. [id number] in April of this year for potential paraesophageal hernia repair, however,  she is a somewhat risky candidate given her comorbid conditions and significant size of the hernia. She will need cardiac clearance, PFTs prior to consideration. This is certainly not an urgent matter and can be pursued outpatient. He can also address the recurrent periumbilical hernia during the same procedure.  If the decision ends up being to not repair the paraesophageal hernia, the periumbilical can be done alone, by Dr. Lady Gary.   All of the above findings and recommendations were discussed with the patient, and the medical team, and all of patient's questions were answered to her expressed satisfaction.  -- Lynden Oxford, PA-C [facility] 10/17/2019, 7:34 AM [phone] M-F:  7am - 4pm I saw and evaluated the patient.  I agree with the above documentation, exam, and plan, which I have edited where appropriate. Duanne Guess  10:54 AM

## 2019-10-17 NOTE — Progress Notes (Signed)
Mobility Specialist - Progress Note   10/17/19 1118  Mobility  Activity Ambulated in hall  Level of Assistance Independent  Assistive Device None (CGA for safety precautions)  Distance Ambulated (ft) 120 ft  Mobility Response Tolerated well  Mobility performed by Mobility specialist  $Mobility charge 1 Mobility     10/17/19 1118  Mobility  Activity Ambulated in hall  Level of Assistance Independent  Assistive Device None (CGA for safety precautions)  Distance Ambulated (ft) 120 ft  Mobility Response Tolerated well  Mobility performed by Mobility specialist  $Mobility charge 1 Mobility    Pre-mobility: 88 HR, 126/67 BP, 92% SpO2 During mobility: 99 HR, 86% SpO2 Post-mobility: 94 HR, 120/77 BP, 97% SpO2   Pt was lying in bed on arrival with nurse present in room. Pt agreed to ambulate the halls. Pt was independent in sit-to-stand and SBA for ambulation. Pt did not use assistive device for ambulating, CGA was used for safety precautions. Pt ambulated 16ft before c/o SOB, O2 desat to 86%. Went over pursed lip breathing exercises with pt to improve O2. After waiting 45 secs, pt's O2 improved to 93%. Overall, pt tolerated session well. Pt was left in bed with phone and call bell in reach. Nurse was notified of performance.    Filiberto Pinks Mobility Specialist 10/17/19, 11:33 AM

## 2019-10-18 ENCOUNTER — Ambulatory Visit: Payer: Medicare HMO | Admitting: General Surgery

## 2019-10-18 LAB — BASIC METABOLIC PANEL
Anion gap: 11 (ref 5–15)
BUN: 18 mg/dL (ref 8–23)
CO2: 22 mmol/L (ref 22–32)
Calcium: 8.7 mg/dL — ABNORMAL LOW (ref 8.9–10.3)
Chloride: 106 mmol/L (ref 98–111)
Creatinine, Ser: 0.91 mg/dL (ref 0.44–1.00)
GFR calc Af Amer: >60 mL/min (ref >60)
GFR calc non Af Amer: >60 mL/min (ref >60)
Glucose, Bld: 77 mg/dL (ref 70–99)
Potassium: 3.4 mmol/L — ABNORMAL LOW (ref 3.5–5.1)
Sodium: 139 mmol/L (ref 135–145)

## 2019-10-18 LAB — MAGNESIUM: Magnesium: 1.8 mg/dL (ref 1.7–2.4)

## 2019-10-18 LAB — CBC
HCT: 39.9 % (ref 36.0–46.0)
Hemoglobin: 14 g/dL (ref 12.0–15.0)
MCH: 31 pg (ref 26.0–34.0)
MCHC: 35.1 g/dL (ref 30.0–36.0)
MCV: 88.5 fL (ref 80.0–100.0)
Platelets: 220 10*3/uL (ref 150–400)
RBC: 4.51 MIL/uL (ref 3.87–5.11)
RDW: 12.9 % (ref 11.5–15.5)
WBC: 7.1 10*3/uL (ref 4.0–10.5)
nRBC: 0 % (ref 0.0–0.2)

## 2019-10-18 MED ORDER — POLYETHYLENE GLYCOL 3350 17 G PO PACK: 17.0000 g | PACK | Freq: Every day | ORAL | 0 refills | Status: DC

## 2019-10-18 MED ORDER — POTASSIUM CHLORIDE CRYS ER 20 MEQ PO TBCR
40.0000 meq | EXTENDED_RELEASE_TABLET | Freq: Once | ORAL | Status: AC
  Administered 2019-10-18: 40 meq via ORAL
  Filled 2019-10-18: qty 2

## 2019-10-18 NOTE — Discharge Summary (Signed)
Physician Discharge Summary   Katrina Sutton  female DOB: 1950-09-19  ZOX:[id number]  PCP: Tarri Fuller, FNP  Admit date: 10/11/2019 Discharge date: 10/18/2019  Admitted From: home Disposition:  home Husband and son updated at the bedside prior to discharge.  CODE STATUS: DNR  Discharge Instructions    Discharge instructions   Complete by: As directed    Avoid constipation by taking Miralax 17 g daily, which will also help avoid bowel obstruction.   Dr. Darlin Priestly [facility] Course:  For full details, please see H&P, progress notes, consult notes and ancillary notes.  Briefly,  Katrina T Rileyis a 69 y.o.Caucasian femalewith history significant for large paraesophageal hernia, ventral hernia status post repair 2 years ago, hypertension, depression, and anxiety, presenting to emergency department with several days of periumbilical abdominal pain, nausea, and vomiting.   Partial SBO Presented with periumbilical pain and N/V and has CT findings suggestive of ventral hernia with partial SBO in addition to the known large paraesophageal hernia.  NGT was placed in ED, GenSurg consulted and recommended conservative management.  NG tube removed 8/2, diet advanced as tolerated.  Prior to discharge, symptoms had resolved, pt was tolerating soft foods and having BM's.    Constipation Pt has having bloating and mild pain after starting soft diet, suspect a component of constipation.  High-dose Miralax ordered which resulted in large BM and relief in symptoms.  Pt was able to eat better after that.  Pt was advised to take Miralax daily to avoid constipation which can exacerbate bowel obstruction.  Paraesophageal hernia Patient reported undergoing outpatient evaluation for possible repair but not sure that she wants to proceed with that.  Continue PPIas PO after pt started tolerating oral intake.  Depression, anxiety continued Klonopin and Paxil  Hypertension Continued  home lisinopril     Discharge Diagnoses:  Principal Problem:   Partial small bowel obstruction (HCC) Active Problems:   Depression with anxiety   Umbilical hernia   Paraesophageal hernia    Discharge Instructions:  Allergies as of 10/18/2019      Reactions   Erythromycin Shortness Of Breath, Rash   Duloxetine Palpitations      Medication List    TAKE these medications   acetaminophen 500 MG tablet Commonly known as: TYLENOL Take 500 mg by mouth every 8 (eight) hours as needed for headache.   clonazePAM 1 MG tablet Commonly known as: KLONOPIN TAKE 1 TABLET BY MOUTH TWICE A DAY AS NEEDED ANXIETY   fluticasone 50 MCG/ACT nasal spray Commonly known as: FLONASE Place 1 spray into both nostrils daily as needed.   hydrocortisone cream 1 % Apply 1 application topically 2 (two) times daily as needed for itching.   linaclotide 145 MCG Caps capsule Commonly known as: Linzess Take 1 capsule (145 mcg total) by mouth daily before breakfast.   lisinopril 10 MG tablet Commonly known as: ZESTRIL Take 1 tablet (10 mg total) by mouth daily.   omeprazole 40 MG capsule Commonly known as: PRILOSEC Take 1 capsule (40 mg total) by mouth daily. In morning   omeprazole 20 MG capsule Commonly known as: PRILOSEC Take 1 capsule (20 mg total) by mouth daily. In evening   PARoxetine 40 MG tablet Commonly known as: PAXIL TAKE 1 TABLET BY MOUTH DAILY   polyethylene glycol 17 g packet Commonly known as: MIRALAX / GLYCOLAX Take 17 g by mouth daily. What changed:   when to take this  reasons  to take this   sodium chloride 0.65 % Soln nasal spray Commonly known as: OCEAN Place 1 spray into both nostrils as needed for congestion.        Follow-up Information    Malfi, Jodelle GrossNicole M, FNP. Schedule an appointment as soon as possible for a visit in 1 week(s).   Specialty: Family Medicine Contact information: [address] [place] [id number] [phone]                Allergies  Allergen Reactions  . Erythromycin Shortness Of Breath and Rash  . Duloxetine Palpitations     The results of significant diagnostics from this hospitalization (including imaging, microbiology, ancillary and laboratory) are listed below for reference.   Consultations:   Procedures/Studies: DG Abd 1 View  Result Date: 10/14/2019 CLINICAL DATA:  Follow-up small bowel obstruction. EXAM: ABDOMEN - 1 VIEW COMPARISON:  10/13/2019 and earlier, including CT abdomen and pelvis 10/12/2019. FINDINGS: Improvement in the partial small bowel obstruction since yesterday, though there are scattered persistent mildly distended loops of small bowel in the mid and upper abdomen. Gas and high attenuation stool within normal caliber ascending and transverse colon. No suggestion of free air on the supine image. Surgical clips in the RIGHT UPPER QUADRANT from prior cholecystectomy. Phleboliths in the LEFT side of the pelvis. IMPRESSION: Improvement in the partial small bowel obstruction since yesterday, though there are persistent mildly distended loops of small bowel in the mid and upper abdomen. Electronically Signed   By: Hulan Saashomas  Lawrence M.D.   On: 10/14/2019 12:31   DG Abd 1 View  Result Date: 10/13/2019 CLINICAL DATA:  Abdominal distension. Admitted yesterday for abdominal pain, nausea and vomiting. EXAM: ABDOMEN - 1 VIEW COMPARISON:  Plain film of the abdomen dated 10/12/2019. CT abdomen dated 10/12/2019. FINDINGS: Distended gas-filled loops of small bowel are again seen throughout the abdomen and pelvis, compatible with the findings of partial small bowel obstruction on earlier CT abdomen. No evidence of free intraperitoneal air. Cholecystectomy clips in the RIGHT upper quadrant. No acute or suspicious osseous finding. Again noted is RIGHT were displaced of the nasogastric tube, stable. Underlying bowel loops are likely associated with the [place] off a GIA lymph hiatal hernia better demonstrated  on earlier CT abdomen. IMPRESSION: 1. Persistent evidence of small-bowel obstruction. 2. Stable rightward course of the nasogastric tube. As described on the earlier plain film report of 10/12/2019, this is an unusual course for a nasogastric tube but does correspond to the anatomy seen on recent CT abdomen related to the large paraesophageal hernia resulting in the stomach being located to the RIGHT of midline. Electronically Signed   By: Bary RichardStan  Maynard M.D.   On: 10/13/2019 12:34   DG Abdomen 1 View  Result Date: 10/12/2019 CLINICAL DATA:  Nasogastric tube placement EXAM: ABDOMEN - 1 VIEW COMPARISON:  CT abdomen and pelvis October 12, 2019 FINDINGS: Note that the right hemidiaphragm is markedly elevated with large paraesophageal type hernia with stomach to the right of midline. Given this unusual appearance, nasogastric tube tip and side-port felt to be present within the stomach. There is no bowel dilatation or air-fluid level to suggest bowel obstruction. No free air. There is bibasilar atelectasis. IMPRESSION: Unusual course of the nasogastric tube felt to be secondary to large paraesophageal hernia with stomach located to the right of midline in the mid chest region. Nasogastric tube tip and side port are felt to reside within the stomach; would advise confirmation of gastric aspirate in this regard  given only single view for assessment currently. No bowel obstruction or free air. Electronically Signed   By: Bretta Bang III M.D.   On: 10/12/2019 05:58   CT ABDOMEN PELVIS W CONTRAST  Result Date: 10/12/2019 CLINICAL DATA:  Nausea vomiting, question of pancreatitis EXAM: CT ABDOMEN AND PELVIS WITH CONTRAST TECHNIQUE: Multidetector CT imaging of the abdomen and pelvis was performed using the standard protocol following bolus administration of intravenous contrast. CONTRAST:  OMNIPAQUE IOHEXOL 300 MG/ML  SOLN COMPARISON:  None. FINDINGS: Lower chest: The visualized heart size within normal limits.  No pericardial fluid/thickening. There is a very large right-sided paraesophageal hernia with the majority of the stomach contents herniated within the chest. There is also a small portion of the transverse colon. The visualized lung bases are clear. Hepatobiliary: The liver is normal in density without focal abnormality.The main portal vein is patent. The patient is status post cholecystectomy. No biliary ductal dilation. Pancreas: Unremarkable. No pancreatic ductal dilatation or surrounding inflammatory changes. Spleen: Normal in size without focal abnormality. Adrenals/Urinary Tract: Both adrenal glands appear normal. Multiple tiny low-density lesions seen within both kidneys. Bladder is unremarkable. Stomach/Bowel: Lung as noted above there is a again noted a large paraesophageal hernia with the entirety of the stomach herniated within the chest wall. There is also a small portion of the transverse colon within the hernia sac. There is mildly dilated loops of jejunum and ileum down to the level distal ileum/terminal ileum. A small portion of ileum appears to be within the anterior abdominal wall hernia sac with a focal area of narrowing. Scattered colonic diverticula are noted. Vascular/Lymphatic: There are no enlarged mesenteric, retroperitoneal, or pelvic lymph nodes. Scattered aortic atherosclerotic calcifications are seen without aneurysmal dilatation. Reproductive: The uterus and adnexa are unremarkable. Other: There is a small anterior abdominal wall hernia containing a small amount of fluid and transverse colon. Musculoskeletal: No acute or significant osseous findings. IMPRESSION: Mildly dilated proximal small bowel to the level of the anterior abdominal wall hernia where there is a focal area of narrowing within the distal ileal loops, likely due to partial small bowel obstruction. Again noted is a large paraesophageal hernia and a small portion of transverse colon seen within the hernia sac. Aortic  Atherosclerosis (ICD10-I70.0). Electronically Signed   By: Jonna Clark M.D.   On: 10/12/2019 00:38   DG Chest Portable 1 View  Result Date: 10/11/2019 CLINICAL DATA:  Shortness of breath EXAM: PORTABLE CHEST 1 VIEW COMPARISON:  05/08/2019 radiograph and chest CT, CT 03/09/2018 FINDINGS: Large right-sided hernia containing air-filled bowel. Opacity at the right base probably reflects chronic atelectasis. Partially obscured cardiomediastinal silhouette with aortic atherosclerosis. No pleural effusion or pneumothorax. IMPRESSION: Large right-sided hernia containing air-filled bowel. Probable chronic atelectasis at the right base. Electronically Signed   By: Jasmine Pang M.D.   On: 10/11/2019 23:55      Labs: BNP (last 3 results) Recent Labs    05/08/19 1625  BNP 59.0   Basic Metabolic Panel: Recent Labs  Lab 10/13/19 0550 10/14/19 0527 10/15/19 0400 10/16/19 0741 10/18/19 0450  NA 140 143 142 140 139  K 3.8 3.7 3.5 3.3* 3.4*  CL 102 105 107 106 106  CO2 25 25 24 23 22   GLUCOSE 98 77 78 81 77  BUN 26* 27* 30* 23 18  CREATININE 1.42* 1.09* 1.00 0.91 0.91  CALCIUM 9.0 9.0 8.9 8.8* 8.7*  MG  --   --   --  1.7 1.8   Liver Function  Tests: Recent Labs  Lab 10/11/19 1726  AST 18  ALT 14  ALKPHOS 113  BILITOT 1.3*  PROT 7.6  ALBUMIN 4.6   Recent Labs  Lab 10/11/19 1726  LIPASE 63*   No results for input(s): AMMONIA in the last 168 hours. CBC: Recent Labs  Lab 10/13/19 0550 10/14/19 0527 10/15/19 0400 10/16/19 0741 10/18/19 0450  WBC 13.9* 11.9* 11.8* 9.6 7.1  NEUTROABS  --   --   --  7.8*  --   HGB 15.0 15.7* 14.5 14.4 14.0  HCT 43.7 45.9 43.0 41.6 39.9  MCV 89.2 91.3 92.5 88.7 88.5  PLT 263 258 228 216 220   Cardiac Enzymes: No results for input(s): CKTOTAL, CKMB, CKMBINDEX, TROPONINI in the last 168 hours. BNP: Invalid input(s): POCBNP CBG: No results for input(s): GLUCAP in the last 168 hours. D-Dimer No results for input(s): DDIMER in the last 72  hours. Hgb A1c No results for input(s): HGBA1C in the last 72 hours. Lipid Profile No results for input(s): CHOL, HDL, LDLCALC, TRIG, CHOLHDL, LDLDIRECT in the last 72 hours. Thyroid function studies No results for input(s): TSH, T4TOTAL, T3FREE, THYROIDAB in the last 72 hours.  Invalid input(s): FREET3 Anemia work up No results for input(s): VITAMINB12, FOLATE, FERRITIN, TIBC, IRON, RETICCTPCT in the last 72 hours. Urinalysis    Component Value Date/Time   COLORURINE YELLOW (A) 10/13/2019 0925   APPEARANCEUR HAZY (A) 10/13/2019 0925   APPEARANCEUR Clear 06/28/2011 1412   LABSPEC 1.030 10/13/2019 0925   LABSPEC 1.005 06/28/2011 1412   PHURINE 5.0 10/13/2019 0925   GLUCOSEU NEGATIVE 10/13/2019 0925   GLUCOSEU Negative 06/28/2011 1412   HGBUR SMALL (A) 10/13/2019 0925   BILIRUBINUR NEGATIVE 10/13/2019 0925   BILIRUBINUR neg 09/13/2019 0850   BILIRUBINUR Negative 06/28/2011 1412   KETONESUR 5 (A) 10/13/2019 0925   PROTEINUR 30 (A) 10/13/2019 0925   UROBILINOGEN 0.2 09/13/2019 0850   NITRITE NEGATIVE 10/13/2019 0925   LEUKOCYTESUR MODERATE (A) 10/13/2019 0925   LEUKOCYTESUR Negative 06/28/2011 1412   Sepsis Labs Invalid input(s): PROCALCITONIN,  WBC,  LACTICIDVEN Microbiology Recent Results (from the past 240 hour(s))  SARS Coronavirus 2 by RT PCR (hospital order, performed in [facility] hospital lab) Nasopharyngeal Nasopharyngeal Swab     Status: None   Collection Time: 10/12/19  1:47 AM   Specimen: Nasopharyngeal Swab  Result Value Ref Range Status   SARS Coronavirus 2 NEGATIVE NEGATIVE Final    Comment: (NOTE) SARS-CoV-2 target nucleic acids are NOT DETECTED.  The SARS-CoV-2 RNA is generally detectable in upper and lower respiratory specimens during the acute phase of infection. The lowest concentration of SARS-CoV-2 viral copies this assay can detect is 250 copies / mL. A negative result does not preclude SARS-CoV-2 infection and should not be used as the sole basis  for treatment or other patient management decisions.  A negative result may occur with improper specimen collection / handling, submission of specimen other than nasopharyngeal swab, presence of viral mutation(s) within the areas targeted by this assay, and inadequate number of viral copies (<250 copies / mL). A negative result must be combined with clinical observations, patient history, and epidemiological information.  Fact Sheet for Patients:   BoilerBrush.com.cy  Fact Sheet for Healthcare Providers: https://pope.com/  This test is not yet approved or  cleared by the [place] FDA and has been authorized for detection and/or diagnosis of SARS-CoV-2 by FDA under an Emergency Use Authorization (EUA).  This EUA will remain in effect (meaning this test can  be used) for the duration of the COVID-19 declaration under Section 564(b)(1) of the Act, 21 U.S.C. section 360bbb-3(b)(1), unless the authorization is terminated or revoked sooner.  Performed at [facility], [address] [place], [place] [address]      Total time spend on discharging this patient, including the last patient exam, discussing the hospital stay, instructions for ongoing care as it relates to all pertinent caregivers, as well as preparing the medical discharge records, prescriptions, and/or referrals as applicable, is 40 minutes.    Darlin Priestly, MD  [facility] 10/18/2019, 10:33 AM  If 7PM-7AM, please contact night-coverage

## 2019-10-18 NOTE — Care Management Important Message (Signed)
Important Message  Patient Details  Name: Katrina Sutton MRN: [id number] Date of Birth: 10-01-1950   Medicare Important Message Given:  Yes     Olegario Messier A Mahogani Holohan 10/18/2019, 11:28 AM

## 2019-10-18 NOTE — Progress Notes (Signed)
Cherri T Lett A and O x4. VSS. Pt tolerating diet well. No complaints of nausea or vomiting. IV removed intact, prescriptions given. Pt voices understanding of discharge instructions with no further questions. Patient discharged via wheelchair with volunteer  Allergies as of 10/18/2019      Reactions   Erythromycin Shortness Of Breath, Rash   Duloxetine Palpitations      Medication List    TAKE these medications   acetaminophen 500 MG tablet Commonly known as: TYLENOL Take 500 mg by mouth every 8 (eight) hours as needed for headache.   clonazePAM 1 MG tablet Commonly known as: KLONOPIN TAKE 1 TABLET BY MOUTH TWICE A DAY AS NEEDED ANXIETY   fluticasone 50 MCG/ACT nasal spray Commonly known as: FLONASE Place 1 spray into both nostrils daily as needed.   hydrocortisone cream 1 % Apply 1 application topically 2 (two) times daily as needed for itching.   linaclotide 145 MCG Caps capsule Commonly known as: Linzess Take 1 capsule (145 mcg total) by mouth daily before breakfast.   lisinopril 10 MG tablet Commonly known as: ZESTRIL Take 1 tablet (10 mg total) by mouth daily.   omeprazole 40 MG capsule Commonly known as: PRILOSEC Take 1 capsule (40 mg total) by mouth daily. In morning   omeprazole 20 MG capsule Commonly known as: PRILOSEC Take 1 capsule (20 mg total) by mouth daily. In evening   PARoxetine 40 MG tablet Commonly known as: PAXIL TAKE 1 TABLET BY MOUTH DAILY   polyethylene glycol 17 g packet Commonly known as: MIRALAX / GLYCOLAX Take 17 g by mouth daily. What changed:   when to take this  reasons to take this   sodium chloride 0.65 % Soln nasal spray Commonly known as: OCEAN Place 1 spray into both nostrils as needed for congestion.       Vitals:   10/18/19 0458 10/18/19 0822  BP: 123/74 122/71  Pulse: 73   Resp: 18   Temp: 98 F (36.7 C)   SpO2: 93%     Rafiq Bucklin C Alexsandro Salek

## 2019-10-19 ENCOUNTER — Telehealth: Payer: Self-pay

## 2019-10-19 NOTE — Telephone Encounter (Signed)
Transition Care Management Follow-up Telephone Call  Date of discharge and from where: 10/18/2019 [facility]  How have you been since you were released from the hospital? Eating ok as much as she can tolerate, having bowel movements  Any questions or concerns? No  Items Reviewed:  Did the pt receive and understand the discharge instructions provided? Yes   Medications obtained and verified? Yes   Any new allergies since your discharge? No   Dietary orders reviewed? Yes  Do you have support at home? Yes   Functional Questionnaire: (I = Independent and D = Dependent) ADLs: I  Bathing/Dressing- I  Meal Prep- I  Eating- I  Maintaining continence- I  Transferring/Ambulation- I  Managing Meds- I  Follow up appointments reviewed:   PCP Hospital f/u appt confirmed? Yes  Scheduled to see Danielle Rankin on 10/25/2019 @ 9:20.  Are transportation arrangements needed? No   If their condition worsens, is the pt aware to call PCP or go to the Emergency Dept.? Yes  Was the patient provided with contact information for the PCP's office or ED? Yes  Was to pt encouraged to call back with questions or concerns? Yes

## 2019-10-22 ENCOUNTER — Ambulatory Visit: Payer: Self-pay | Admitting: Pharmacist

## 2019-10-22 DIAGNOSIS — K219 Gastro-esophageal reflux disease without esophagitis: Secondary | ICD-10-CM

## 2019-10-22 DIAGNOSIS — I10 Essential (primary) hypertension: Secondary | ICD-10-CM

## 2019-10-22 NOTE — Telephone Encounter (Signed)
Copied from CRM [id number]. Topic: General - Inquiry >> Oct 22, 2019 12:57 PM Adrian Prince D wrote: Reason for CRM: Patient was in hospital and wasn't given linzess, she wants to know if she is suppose to still take it because she thinks she still needs it. Please advise

## 2019-10-22 NOTE — Chronic Care Management (AMB) (Signed)
Chronic Care Management   Follow Up Note   10/22/2019 Name: Katrina Sutton MRN: [id number] DOB: 1950-04-27  Referred by: Katrina Fuller, FNP Reason for referral : No chief complaint on file.   Katrina Sutton is a 69 y.o. year old female who is a primary care patient of Katrina Fuller, FNP. The CCM team was consulted for assistance with chronic disease management and care coordination needs.    Receive message from [facility] Nurse Case Manager. Patient called office today asking about whether she was supposed to continue taking Linzess after discharge from hospital.  Note patient admitted to [facility] 7/29-8/5 for partial small bowel obstruction.  I reached out to Katrina Sutton by phone today.   Patient was recently discharged from hospital and all medications have been reviewed.  Review of patient status, including review of consultants reports, relevant laboratory and other test results, and collaboration with appropriate care team members and the patient's provider was performed as part of comprehensive patient evaluation and provision of chronic care management services.     Outpatient Encounter Medications as of 10/22/2019  Medication Sig Note  . acetaminophen (TYLENOL) 500 MG tablet Take 500 mg by mouth every 8 (eight) hours as needed for headache.   . clonazePAM (KLONOPIN) 1 MG tablet TAKE 1 TABLET BY MOUTH TWICE A DAY AS NEEDED ANXIETY   . fluticasone (FLONASE) 50 MCG/ACT nasal spray Place 1 spray into both nostrils daily as needed.   . linaclotide (LINZESS) 145 MCG CAPS capsule Take 1 capsule (145 mcg total) by mouth daily before breakfast.   . lisinopril (ZESTRIL) 10 MG tablet Take 1 tablet (10 mg total) by mouth daily.   Marland Kitchen omeprazole (PRILOSEC) 20 MG capsule Take 1 capsule (20 mg total) by mouth daily. In evening   . omeprazole (PRILOSEC) 40 MG capsule Take 1 capsule (40 mg total) by mouth daily. In morning   . PARoxetine (PAXIL) 40 MG tablet TAKE 1 TABLET BY MOUTH DAILY   .  polyethylene glycol (MIRALAX / GLYCOLAX) 17 g packet Take 17 g by mouth daily.   . sodium chloride (OCEAN) 0.65 % SOLN nasal spray Place 1 spray into both nostrils as needed for congestion.   . hydrocortisone cream 1 % Apply 1 application topically 2 (two) times daily as needed for itching.  (Patient not taking: Reported on 10/22/2019) 06/19/2019: Very seldom    No facility-administered encounter medications on file as of 10/22/2019.    Goals Addressed            This Visit's Progress   . PharmD- Medication Review       CARE PLAN ENTRY (see longitudinal plan of care for additional care plan information)   Current Barriers:  . Chronic Disease Management support, education, and care coordination needs related to related to constipation, GERD, anxiety and depression  Pharmacist Clinical Goal(s):  Marland Kitchen Over the next 1 days, patient will work with CM Pharmacist to address needs related to medication questions  Interventions: . Provider and Inter-disciplinary care team collaboration (see longitudinal plan of care) . Follow up with patient and review discharge medication list from 8/5 o Reports restarted taking Linzess 145 mcg (1 capsule daily before breakfast as directed) today after she reviewed this discharge paperwork o Patient reports taking the rest of medications from list as directed, including Miralax 17 g by mouth daily. . Counsel patient on taking omeprazole doses at least 30 minutes prior to meals (breakfast and supper) . Confirms monitoring home  BP as directed. Reports BP today: 110/80, HR 72 . Coordination of care: patient confirms planning to attend upcoming appointment with PCP on 8/12 . Counsel patient to call provider if needed for new/worsening medical concerns  Patient Self Care Activities:  . Attends all scheduled provider appointments   Initial goal documentation        Plan  1) Patient denies any further medication questions/concerns at this time. 2) The patient  has been provided with contact information for CM Pharmacist and has been advised to call with any medication related questions or concerns.   Duanne Moron, PharmD, [facility] Clinical Pharmacist [facility] Medical [employer] [phone]

## 2019-10-22 NOTE — Telephone Encounter (Signed)
Called today and reviewed 8/5 hospital discharge medication list with patient. Denies further medication questions. Please see CCM note for further details.  Duanne Moron, PharmD, [facility] Clinical Pharmacist [facility] Medical [employer] [phone]

## 2019-10-22 NOTE — Telephone Encounter (Signed)
-----   Message from Smitty Cords, DO sent at 10/22/2019  3:27 PM EDT ----- Regarding: RE: Need call back Can you call the patient and clarify what is the question? Let me know how I can help. Looks like she should be on Linzess once daily from her GI doctor.  ----- Message ----- From: Lavinia Sharps, CMA Sent: 10/22/2019   3:26 PM EDT To: Smitty Cords, DO Subject: FW: Need call back                              ----- Message ----- From: Marlowe Sax Sent: 10/22/2019   2:46 PM EDT To: Daphane Shepherd, RPH, Lonna Cobb, CMA, # Subject: FW: Need call back                             Hello, Can any of you help with this today? I know Joni Reining is not in the office.  Thanks, Pam ----- Message ----- From: Consuella Lose, CMA Sent: 10/22/2019   2:26 PM EDT To: Marlowe Sax Subject: Need call back                                 Hi Pam,  Received a call from Ms Katrina Sutton. Patient states while she was at the hospital recently and realized after coming home last Thursday, that she was not been given Linzess while she was there. And she has not had any of this medication until today. She has tried to get in touch with her PCP to ask about this but has not heard back. She feels like she is getting constipated and did not want to end up at the hospital again. She went ahead and took Linzess 1 tablet today.  Patient requests call back to discuss. CB# [phone]  Thank you Alvina Chou

## 2019-10-22 NOTE — Patient Instructions (Signed)
Thank you allowing the Chronic Care Management Team to be a part of your care! It was a pleasure speaking with you today!     CCM (Chronic Care Management) Team    Alto Denver RN, MSN, CCM Nurse Care Coordinator  [phone]   Duanne Moron PharmD  Clinical Pharmacist  [phone]   Dickie La LCSW Clinical Social Worker [phone]  Visit Information  Goals Addressed            This Visit's Progress    PharmD- Medication Review       CARE PLAN ENTRY (see longitudinal plan of care for additional care plan information)   Current Barriers:   Chronic Disease Management support, education, and care coordination needs related to related to constipation, GERD, anxiety and depression  Pharmacist Clinical Goal(s):   Over the next 1 days, patient will work with CM Pharmacist to address needs related to medication questions  Interventions:  Provider and Inter-disciplinary care team collaboration (see longitudinal plan of care)  Follow up with patient and review discharge medication list from 8/5 o Reports restarted taking Linzess 145 mcg (1 capsule daily before breakfast as directed) today after she reviewed this discharge paperwork o Patient reports taking the rest of medications from list as directed, including Miralax 17 g by mouth daily. o Counsel patient on taking omeprazole doses at least 30 minutes prior to meals (breakfast and supper)  Confirms monitoring home BP as directed. Reports BP today: 110/80, HR 72  Coordination of care: patient confirms planning to attend upcoming appointment with PCP on 8/12  Counsel patient to call provider if needed for new/worsening medical concerns  Collaborate with CCM Nurse Case Manager  Patient Self Care Activities:   Attends all scheduled provider appointments   Initial goal documentation        Patient verbalizes understanding of instructions provided today.   1) Patient denies any further medication  questions/concerns at this time. 2) The patient has been provided with contact information for CM Pharmacist and has been advised to call with any medication related questions or concerns.   Duanne Moron, PharmD, [facility] Clinical Pharmacist [facility] [employer] [phone]

## 2019-10-22 NOTE — Telephone Encounter (Signed)
Try calling patient twice unable to reach the patient , also forwarding to P[facility] if she calls back after 5 pm.

## 2019-10-23 ENCOUNTER — Ambulatory Visit: Payer: Self-pay | Admitting: Licensed Clinical Social Worker

## 2019-10-23 NOTE — Chronic Care Management (AMB) (Signed)
  Care Management   Follow Up Note   10/23/2019 Name: Katrina Sutton MRN: [id number] DOB: [birth year]  Referred by: Tarri Fuller, FNP Reason for referral : Care Coordination   Katrina Sutton is a 69 y.o. year old female who is a primary care patient of Tarri Fuller, FNP. The care management team was consulted for assistance with care management and care coordination needs.    Review of patient status, including review of consultants reports, relevant laboratory and other test results, and collaboration with appropriate care team members and the patient's provider was performed as part of comprehensive patient evaluation and provision of chronic care management services.    LCSW received voice message from patient on 10/22/19. Voice message included several pharmacy related questions since she recent hospital admission. LCSW forwarded voice message to [facility] Pharmacist and will route this encounter to her as well.  Dickie La, BSW, MSW, LCSW [facility]   [email or web address] Phone: [phone]

## 2019-10-23 NOTE — Telephone Encounter (Signed)
Appt with NMM FNP for HFU 10/25/19

## 2019-10-25 ENCOUNTER — Encounter: Payer: Self-pay | Admitting: Family Medicine

## 2019-10-25 ENCOUNTER — Other Ambulatory Visit: Payer: Self-pay

## 2019-10-25 ENCOUNTER — Ambulatory Visit (INDEPENDENT_AMBULATORY_CARE_PROVIDER_SITE_OTHER): Payer: Medicare HMO | Admitting: Family Medicine

## 2019-10-25 VITALS — BP 130/68 | HR 77 | Temp 98.4°F | Resp 17 | Ht 66.0 in | Wt 163.6 lb

## 2019-10-25 DIAGNOSIS — E876 Hypokalemia: Secondary | ICD-10-CM | POA: Insufficient documentation

## 2019-10-25 DIAGNOSIS — Z09 Encounter for follow-up examination after completed treatment for conditions other than malignant neoplasm: Secondary | ICD-10-CM | POA: Insufficient documentation

## 2019-10-25 DIAGNOSIS — K5900 Constipation, unspecified: Secondary | ICD-10-CM

## 2019-10-25 DIAGNOSIS — I10 Essential (primary) hypertension: Secondary | ICD-10-CM | POA: Diagnosis not present

## 2019-10-25 DIAGNOSIS — K56600 Partial intestinal obstruction, unspecified as to cause: Secondary | ICD-10-CM | POA: Diagnosis not present

## 2019-10-25 DIAGNOSIS — Z79899 Other long term (current) drug therapy: Secondary | ICD-10-CM | POA: Diagnosis not present

## 2019-10-25 DIAGNOSIS — R635 Abnormal weight gain: Secondary | ICD-10-CM | POA: Diagnosis not present

## 2019-10-25 MED ORDER — POLYETHYLENE GLYCOL 3350 17 GM/SCOOP PO POWD: 17.0000 g | Freq: Two times a day (BID) | ORAL | 1 refills | Status: DC | PRN

## 2019-10-25 NOTE — Assessment & Plan Note (Signed)
Currently stable and well controlled with mirilax and linzess daily.  Has to schedule an appointment with Dr. Lady Gary, surgery, to discuss scheduling outpatient surgery.  Provided with contact information.  Last inpatient note requesting cardiology and pulmonary function testing presurgically.  Patient aware.  Plan: 1. Contact Dr. America Brown office to schedule outpatient follow up visit to further discuss/schedule surgery 2. Continue Mirilax and Linzess daily 3. RTC PRN

## 2019-10-25 NOTE — Progress Notes (Signed)
Subjective:    Patient ID: Katrina Sutton, female    DOB: 1950-08-01, 69 y.o.   MRN: [id number]  Katrina Sutton is a 69 y.o. female presenting on 10/25/2019 for Hospitalization Follow-up (Partial small bowel obstruction x 7days. Pt state her bowel movement area loose, but still not normal. She have not spoke with her genral surgeon about scheduling her hernia surgery. )   HPI  HOSPITAL FOLLOW-UP VISIT  Hospital/Location: [facility] Date of Admission: 10/11/2019 Date of Discharge: 10/18/2019 Transitions of care telephone call: 10/19/2019  Reason for Admission: Partial small bowel obstruction Primary (+Secondary) Diagnosis: Constipation, paraesophageal hernia, nausea and vomiting, generalized abdominal pain  - Hospital H&P and Discharge Summary have been reviewed - Patient presents today 7 days after recent hospitalization. Brief summary of recent course, patient had symptoms of generalized abdominal pain with nausea and vomiting for several days, hospitalized, treated with medications, imaging, and NG tube to suction. - Today reports overall has done well after discharge. Symptoms of are much improved. - New medications on discharge: polyethylene glycol 1 cap full daily - Changes to current meds on discharge: No changes  I have reviewed the discharge medication list, and have reconciled the current and discharge medications today.   Current Outpatient Medications:  .  acetaminophen (TYLENOL) 500 MG tablet, Take 500 mg by mouth every 8 (eight) hours as needed for headache., Disp: , Rfl:  .  clonazePAM (KLONOPIN) 1 MG tablet, TAKE 1 TABLET BY MOUTH TWICE A DAY AS NEEDED ANXIETY, Disp: 60 tablet, Rfl: 0 .  fluticasone (FLONASE) 50 MCG/ACT nasal spray, Place 1 spray into both nostrils daily as needed., Disp: , Rfl:  .  hydrocortisone cream 1 %, Apply 1 application topically 2 (two) times daily as needed for itching. , Disp: , Rfl:  .  linaclotide (LINZESS) 145 MCG CAPS capsule, Take 1 capsule (145  mcg total) by mouth daily before breakfast., Disp: 90 capsule, Rfl: 1 .  lisinopril (ZESTRIL) 10 MG tablet, Take 1 tablet (10 mg total) by mouth daily., Disp: 90 tablet, Rfl: 3 .  omeprazole (PRILOSEC) 20 MG capsule, Take 1 capsule (20 mg total) by mouth daily. In evening, Disp: 90 capsule, Rfl: 1 .  omeprazole (PRILOSEC) 40 MG capsule, Take 1 capsule (40 mg total) by mouth daily. In morning, Disp: 90 capsule, Rfl: 1 .  PARoxetine (PAXIL) 40 MG tablet, TAKE 1 TABLET BY MOUTH DAILY, Disp: 90 tablet, Rfl: 1 .  polyethylene glycol (MIRALAX / GLYCOLAX) 17 g packet, Take 17 g by mouth daily., Disp: , Rfl: 0 .  sodium chloride (OCEAN) 0.65 % SOLN nasal spray, Place 1 spray into both nostrils as needed for congestion., Disp: , Rfl:  .  polyethylene glycol powder (GLYCOLAX/MIRALAX) 17 GM/SCOOP powder, Take 17 g by mouth 2 (two) times daily as needed., Disp: 3350 g, Rfl: 1  ------------------------------------------------------------------------- Social History   Tobacco Use  . Smoking status: Former Smoker    Quit date: 12/27/1976    Years since quitting: 42.8  . Smokeless tobacco: Never Used  Vaping Use  . Vaping Use: Never used  Substance Use Topics  . Alcohol use: No  . Drug use: No    Review of Systems  Constitutional: Negative.   HENT: Negative.   Eyes: Negative.   Respiratory: Negative.   Cardiovascular: Negative.   Gastrointestinal: Negative.   Endocrine: Negative.   Genitourinary: Negative.   Musculoskeletal: Negative.   Skin: Negative.   Allergic/Immunologic: Negative.   Neurological: Negative.   Hematological: Negative.  Psychiatric/Behavioral: Negative.    Per HPI unless specifically indicated above     Objective:    BP 130/68 (BP Location: Left Arm, Patient Position: Sitting, Cuff Size: Normal)   Pulse 77   Temp 98.4 F (36.9 C) (Oral)   Resp 17   Ht 5\' 6"  (1.676 m)   Wt 163 lb 9.6 oz (74.2 kg)   SpO2 98%   BMI 26.41 kg/m   Wt Readings from Last 3  Encounters:  10/25/19 163 lb 9.6 oz (74.2 kg)  10/11/19 165 lb (74.8 kg)  09/13/19 166 lb (75.3 kg)    Physical Exam Vitals reviewed.  Constitutional:      General: She is not in acute distress.    Appearance: Normal appearance. She is well-developed, well-groomed and overweight. She is not ill-appearing or toxic-appearing.  HENT:     Head: Normocephalic and atraumatic.     Nose:     Comments: Lesia SagoFacemask is in place, covering mouth and nose. Eyes:     General: Lids are normal. Vision grossly intact.        Right eye: No discharge.        Left eye: No discharge.     Extraocular Movements: Extraocular movements intact.     Conjunctiva/sclera: Conjunctivae normal.     Pupils: Pupils are equal, round, and reactive to light.  Cardiovascular:     Rate and Rhythm: Normal rate and regular rhythm.     Pulses: Normal pulses.          Dorsalis pedis pulses are 2+ on the right side and 2+ on the left side.     Heart sounds: Normal heart sounds. No murmur heard.  No friction rub. No gallop.   Pulmonary:     Effort: Pulmonary effort is normal. No respiratory distress.     Breath sounds: Normal breath sounds.  Musculoskeletal:     Right lower leg: No edema.     Left lower leg: No edema.  Skin:    General: Skin is warm and dry.     Capillary Refill: Capillary refill takes less than 2 seconds.  Neurological:     General: No focal deficit present.     Mental Status: She is alert and oriented to person, place, and time.  Psychiatric:        Attention and Perception: Attention and perception normal.        Mood and Affect: Mood and affect normal.        Speech: Speech normal.        Behavior: Behavior normal. Behavior is cooperative.        Thought Content: Thought content normal.        Cognition and Memory: Cognition and memory normal.        Judgment: Judgment normal.    Results for orders placed or performed during the hospital encounter of 10/11/19  SARS Coronavirus 2 by RT PCR  (hospital order, [facility]) Nasopharyngeal Nasopharyngeal Swab   Specimen: Nasopharyngeal Swab  Result Value Ref Range   SARS Coronavirus 2 NEGATIVE NEGATIVE  Lipase, blood  Result Value Ref Range   Lipase 63 (H) 11 - 51 U/L  Comprehensive metabolic panel  Result Value Ref Range   Sodium 140 135 - 145 mmol/L   Potassium 4.0 3.5 - 5.1 mmol/L   Chloride 106 98 - 111 mmol/L   CO2 23 22 - 32 mmol/L   Glucose, Bld 98 70 - 99 mg/dL   BUN 10  8 - 23 mg/dL   Creatinine, Ser 6.21 0.44 - 1.00 mg/dL   Calcium 9.6 8.9 - 30.8 mg/dL   Total Protein 7.6 6.5 - 8.1 g/dL   Albumin 4.6 3.5 - 5.0 g/dL   AST 18 15 - 41 U/L   ALT 14 0 - 44 U/L   Alkaline Phosphatase 113 38 - 126 U/L   Total Bilirubin 1.3 (H) 0.3 - 1.2 mg/dL   GFR calc non Af Amer >60 >60 mL/min   GFR calc Af Amer >60 >60 mL/min   Anion gap 11 5 - 15  CBC  Result Value Ref Range   WBC 11.7 (H) 4.0 - 10.5 K/uL   RBC 5.09 3.87 - 5.11 MIL/uL   Hemoglobin 15.8 (H) 12.0 - 15.0 g/dL   HCT 65.7 36 - 46 %   MCV 88.0 80.0 - 100.0 fL   MCH 31.0 26.0 - 34.0 pg   MCHC 35.3 30.0 - 36.0 g/dL   RDW 84.6 96.2 - 95.2 %   Platelets 266 150 - 400 K/uL   nRBC 0.0 0.0 - 0.2 %  Basic metabolic panel  Result Value Ref Range   Sodium 138 135 - 145 mmol/L   Potassium 4.3 3.5 - 5.1 mmol/L   Chloride 102 98 - 111 mmol/L   CO2 24 22 - 32 mmol/L   Glucose, Bld 136 (H) 70 - 99 mg/dL   BUN 18 8 - 23 mg/dL   Creatinine, Ser 8.41 (H) 0.44 - 1.00 mg/dL   Calcium 9.7 8.9 - 32.4 mg/dL   GFR calc non Af Amer 28 (L) >60 mL/min   GFR calc Af Amer 32 (L) >60 mL/min   Anion gap 12 5 - 15  CBC  Result Value Ref Range   WBC 18.4 (H) 4.0 - 10.5 K/uL   RBC 5.32 (H) 3.87 - 5.11 MIL/uL   Hemoglobin 16.5 (H) 12.0 - 15.0 g/dL   HCT 40.1 (H) 36 - 46 %   MCV 90.8 80.0 - 100.0 fL   MCH 31.0 26.0 - 34.0 pg   MCHC 34.2 30.0 - 36.0 g/dL   RDW 02.7 25.3 - 66.4 %   Platelets 285 150 - 400 K/uL   nRBC 0.0 0.0 - 0.2 %  Basic metabolic panel   Result Value Ref Range   Sodium 140 135 - 145 mmol/L   Potassium 3.8 3.5 - 5.1 mmol/L   Chloride 102 98 - 111 mmol/L   CO2 25 22 - 32 mmol/L   Glucose, Bld 98 70 - 99 mg/dL   BUN 26 (H) 8 - 23 mg/dL   Creatinine, Ser 4.03 (H) 0.44 - 1.00 mg/dL   Calcium 9.0 8.9 - 47.4 mg/dL   GFR calc non Af Amer 37 (L) >60 mL/min   GFR calc Af Amer 43 (L) >60 mL/min   Anion gap 13 5 - 15  CBC  Result Value Ref Range   WBC 13.9 (H) 4.0 - 10.5 K/uL   RBC 4.90 3.87 - 5.11 MIL/uL   Hemoglobin 15.0 12.0 - 15.0 g/dL   HCT 25.9 36 - 46 %   MCV 89.2 80.0 - 100.0 fL   MCH 30.6 26.0 - 34.0 pg   MCHC 34.3 30.0 - 36.0 g/dL   RDW 56.3 87.5 - 64.3 %   Platelets 263 150 - 400 K/uL   nRBC 0.0 0.0 - 0.2 %  Urinalysis, Complete w Microscopic  Result Value Ref Range   Color, Urine YELLOW (A) YELLOW  APPearance HAZY (A) CLEAR   Specific Gravity, Urine 1.030 1.005 - 1.030   pH 5.0 5.0 - 8.0   Glucose, UA NEGATIVE NEGATIVE mg/dL   Hgb urine dipstick SMALL (A) NEGATIVE   Bilirubin Urine NEGATIVE NEGATIVE   Ketones, ur 5 (A) NEGATIVE mg/dL   Protein, ur 30 (A) NEGATIVE mg/dL   Nitrite NEGATIVE NEGATIVE   Leukocytes,Ua MODERATE (A) NEGATIVE   RBC / HPF 0-5 0 - 5 RBC/hpf   WBC, UA 21-50 0 - 5 WBC/hpf   Bacteria, UA NONE SEEN NONE SEEN   Squamous Epithelial / LPF 0-5 0 - 5   Mucus PRESENT    Hyaline Casts, UA PRESENT   Basic metabolic panel  Result Value Ref Range   Sodium 143 135 - 145 mmol/L   Potassium 3.7 3.5 - 5.1 mmol/L   Chloride 105 98 - 111 mmol/L   CO2 25 22 - 32 mmol/L   Glucose, Bld 77 70 - 99 mg/dL   BUN 27 (H) 8 - 23 mg/dL   Creatinine, Ser 6.01 (H) 0.44 - 1.00 mg/dL   Calcium 9.0 8.9 - 09.3 mg/dL   GFR calc non Af Amer 51 (L) >60 mL/min   GFR calc Af Amer 59 (L) >60 mL/min   Anion gap 13 5 - 15  CBC  Result Value Ref Range   WBC 11.9 (H) 4.0 - 10.5 K/uL   RBC 5.03 3.87 - 5.11 MIL/uL   Hemoglobin 15.7 (H) 12.0 - 15.0 g/dL   HCT 23.5 36 - 46 %   MCV 91.3 80.0 - 100.0 fL   MCH 31.2  26.0 - 34.0 pg   MCHC 34.2 30.0 - 36.0 g/dL   RDW 57.3 22.0 - 25.4 %   Platelets 258 150 - 400 K/uL   nRBC 0.0 0.0 - 0.2 %  Basic metabolic panel  Result Value Ref Range   Sodium 142 135 - 145 mmol/L   Potassium 3.5 3.5 - 5.1 mmol/L   Chloride 107 98 - 111 mmol/L   CO2 24 22 - 32 mmol/L   Glucose, Bld 78 70 - 99 mg/dL   BUN 30 (H) 8 - 23 mg/dL   Creatinine, Ser 2.70 0.44 - 1.00 mg/dL   Calcium 8.9 8.9 - 62.3 mg/dL   GFR calc non Af Amer 57 (L) >60 mL/min   GFR calc Af Amer >60 >60 mL/min   Anion gap 11 5 - 15  CBC  Result Value Ref Range   WBC 11.8 (H) 4.0 - 10.5 K/uL   RBC 4.65 3.87 - 5.11 MIL/uL   Hemoglobin 14.5 12.0 - 15.0 g/dL   HCT 76.2 36 - 46 %   MCV 92.5 80.0 - 100.0 fL   MCH 31.2 26.0 - 34.0 pg   MCHC 33.7 30.0 - 36.0 g/dL   RDW 83.1 51.7 - 61.6 %   Platelets 228 150 - 400 K/uL   nRBC 0.0 0.0 - 0.2 %  CBC with Differential/Platelet  Result Value Ref Range   WBC 9.6 4.0 - 10.5 K/uL   RBC 4.69 3.87 - 5.11 MIL/uL   Hemoglobin 14.4 12.0 - 15.0 g/dL   HCT 07.3 36 - 46 %   MCV 88.7 80.0 - 100.0 fL   MCH 30.7 26.0 - 34.0 pg   MCHC 34.6 30.0 - 36.0 g/dL   RDW 71.0 62.6 - 94.8 %   Platelets 216 150 - 400 K/uL   nRBC 0.0 0.0 - 0.2 %   Neutrophils Relative % 82 %  Neutro Abs 7.8 (H) 1.7 - 7.7 K/uL   Lymphocytes Relative 10 %   Lymphs Abs 1.0 0.7 - 4.0 K/uL   Monocytes Relative 8 %   Monocytes Absolute 0.7 0 - 1 K/uL   Eosinophils Relative 0 %   Eosinophils Absolute 0.0 0 - 0 K/uL   Basophils Relative 0 %   Basophils Absolute 0.0 0 - 0 K/uL   Immature Granulocytes 0 %   Abs Immature Granulocytes 0.04 0.00 - 0.07 K/uL  Basic metabolic panel  Result Value Ref Range   Sodium 140 135 - 145 mmol/L   Potassium 3.3 (L) 3.5 - 5.1 mmol/L   Chloride 106 98 - 111 mmol/L   CO2 23 22 - 32 mmol/L   Glucose, Bld 81 70 - 99 mg/dL   BUN 23 8 - 23 mg/dL   Creatinine, Ser 3.61 0.44 - 1.00 mg/dL   Calcium 8.8 (L) 8.9 - 10.3 mg/dL   GFR calc non Af Amer >60 >60 mL/min   GFR  calc Af Amer >60 >60 mL/min   Anion gap 11 5 - 15  Magnesium  Result Value Ref Range   Magnesium 1.7 1.7 - 2.4 mg/dL  Basic metabolic panel  Result Value Ref Range   Sodium 139 135 - 145 mmol/L   Potassium 3.4 (L) 3.5 - 5.1 mmol/L   Chloride 106 98 - 111 mmol/L   CO2 22 22 - 32 mmol/L   Glucose, Bld 77 70 - 99 mg/dL   BUN 18 8 - 23 mg/dL   Creatinine, Ser 4.43 0.44 - 1.00 mg/dL   Calcium 8.7 (L) 8.9 - 10.3 mg/dL   GFR calc non Af Amer >60 >60 mL/min   GFR calc Af Amer >60 >60 mL/min   Anion gap 11 5 - 15  CBC  Result Value Ref Range   WBC 7.1 4.0 - 10.5 K/uL   RBC 4.51 3.87 - 5.11 MIL/uL   Hemoglobin 14.0 12.0 - 15.0 g/dL   HCT 15.4 36 - 46 %   MCV 88.5 80.0 - 100.0 fL   MCH 31.0 26.0 - 34.0 pg   MCHC 35.1 30.0 - 36.0 g/dL   RDW 00.8 67.6 - 19.5 %   Platelets 220 150 - 400 K/uL   nRBC 0.0 0.0 - 0.2 %  Magnesium  Result Value Ref Range   Magnesium 1.8 1.7 - 2.4 mg/dL      Assessment & Plan:   Problem List Items Addressed This Visit      Digestive   Partial small bowel obstruction (HCC)    Currently stable and well controlled with mirilax and linzess daily.  Has to schedule an appointment with Dr. Lady Gary, surgery, to discuss scheduling outpatient surgery.  Provided with contact information.  Last inpatient note requesting cardiology and pulmonary function testing presurgically.  Patient aware.  Plan: 1. Contact Dr. America Brown office to schedule outpatient follow up visit to further discuss/schedule surgery 2. Continue Mirilax and Linzess daily 3. RTC PRN        Other   Constipation - Primary    See partial SBO A/P      Relevant Medications   polyethylene glycol powder (GLYCOLAX/MIRALAX) 17 GM/SCOOP powder   Hospital discharge follow-up    Has all prescriptions that have been prescribed by the pharmacy and no acute concerns today.  See partial SBO A/P      Hypokalemia    Decreased potassium while in patient. Potassium results 10/18/19 3.4, 10/16/19 3.3, 10/15/19  3.5, 10/14/19  3.7.  Discussed repeating this to see if was situational given the NG tube and constipation/bowel prep.  Will repeat CMP today for electrolytes and will call once results have been received.  Plan: 1. Have CMP labs drawn today. 2. Will contact with results      Relevant Orders   COMPLETE METABOLIC PANEL WITH GFR       Meds ordered this encounter  Medications  . polyethylene glycol powder (GLYCOLAX/MIRALAX) 17 GM/SCOOP powder    Sig: Take 17 g by mouth 2 (two) times daily as needed.    Dispense:  3350 g    Refill:  1    Follow up plan: Return in about 2 months (around 12/25/2019) for HTN F/U and as needed for presurgical visit.  Charlaine Dalton, FNP-C Family Nurse Practitioner [facility] 10/25/2019, 12:06 PM

## 2019-10-25 NOTE — Assessment & Plan Note (Signed)
Has all prescriptions that have been prescribed by the pharmacy and no acute concerns today.  See partial SBO A/P

## 2019-10-25 NOTE — Assessment & Plan Note (Signed)
Decreased potassium while in patient. Potassium results 10/18/19 3.4, 10/16/19 3.3, 10/15/19 3.5, 10/14/19 3.7.  Discussed repeating this to see if was situational given the NG tube and constipation/bowel prep.  Will repeat CMP today for electrolytes and will call once results have been received.  Plan: 1. Have CMP labs drawn today. 2. Will contact with results

## 2019-10-25 NOTE — Assessment & Plan Note (Signed)
See partial SBO A/P

## 2019-10-25 NOTE — Patient Instructions (Addendum)
Contact Dr. America Brown office to discuss the outpatient surgery [address], [place], [place] [address] Phone: [phone]  Once you have a date scheduled, we can work on sending you to cardiology and pulmonary for clearance.  I have sent in a prescription for the mirilax, to take as prescribed.  We will plan to see you back in 2 months for hypertension follow up visit and as needed  You will receive a survey after today's visit either digitally by e-mail or paper by USPS mail. Your experiences and feedback matter to [place].  Please respond so we know how we are doing as we provide care for you.  Call us with any questions/concerns/needs.  It is my goal to be available to you for your health concerns.  Thanks for choosing me to be a partner in your healthcare needs!  Charlaine Dalton, FNP-C Family Nurse Practitioner [facility] Phone: [phone]

## 2019-10-26 ENCOUNTER — Other Ambulatory Visit: Payer: Self-pay

## 2019-10-26 ENCOUNTER — Inpatient Hospital Stay
Admission: EM | Admit: 2019-10-26 | Discharge: 2019-11-01 | DRG: 354 | Disposition: A | Discharge status: Home / Self Care | Payer: Medicare HMO | Attending: Hospitalist | Admitting: Hospitalist

## 2019-10-26 ENCOUNTER — Encounter: Payer: Self-pay | Admitting: Radiology

## 2019-10-26 ENCOUNTER — Emergency Department: Payer: Medicare HMO

## 2019-10-26 ENCOUNTER — Other Ambulatory Visit: Payer: Self-pay | Admitting: Family Medicine

## 2019-10-26 DIAGNOSIS — K219 Gastro-esophageal reflux disease without esophagitis: Secondary | ICD-10-CM | POA: Diagnosis present

## 2019-10-26 DIAGNOSIS — E860 Dehydration: Secondary | ICD-10-CM | POA: Diagnosis present

## 2019-10-26 DIAGNOSIS — K430 Incisional hernia with obstruction, without gangrene: Principal | ICD-10-CM | POA: Diagnosis present

## 2019-10-26 DIAGNOSIS — N179 Acute kidney failure, unspecified: Secondary | ICD-10-CM | POA: Diagnosis not present

## 2019-10-26 DIAGNOSIS — F419 Anxiety disorder, unspecified: Secondary | ICD-10-CM

## 2019-10-26 DIAGNOSIS — K56609 Unspecified intestinal obstruction, unspecified as to partial versus complete obstruction: Secondary | ICD-10-CM | POA: Diagnosis present

## 2019-10-26 DIAGNOSIS — R1084 Generalized abdominal pain: Secondary | ICD-10-CM | POA: Diagnosis not present

## 2019-10-26 DIAGNOSIS — R52 Pain, unspecified: Secondary | ICD-10-CM | POA: Diagnosis not present

## 2019-10-26 DIAGNOSIS — Z888 Allergy status to other drugs, medicaments and biological substances status: Secondary | ICD-10-CM | POA: Diagnosis not present

## 2019-10-26 DIAGNOSIS — J302 Other seasonal allergic rhinitis: Secondary | ICD-10-CM | POA: Diagnosis not present

## 2019-10-26 DIAGNOSIS — Z87891 Personal history of nicotine dependence: Secondary | ICD-10-CM | POA: Diagnosis not present

## 2019-10-26 DIAGNOSIS — R1111 Vomiting without nausea: Secondary | ICD-10-CM | POA: Diagnosis not present

## 2019-10-26 DIAGNOSIS — Z818 Family history of other mental and behavioral disorders: Secondary | ICD-10-CM | POA: Diagnosis not present

## 2019-10-26 DIAGNOSIS — Z881 Allergy status to other antibiotic agents status: Secondary | ICD-10-CM | POA: Diagnosis not present

## 2019-10-26 DIAGNOSIS — F329 Major depressive disorder, single episode, unspecified: Secondary | ICD-10-CM | POA: Diagnosis present

## 2019-10-26 DIAGNOSIS — K449 Diaphragmatic hernia without obstruction or gangrene: Secondary | ICD-10-CM | POA: Diagnosis not present

## 2019-10-26 DIAGNOSIS — R0902 Hypoxemia: Secondary | ICD-10-CM | POA: Diagnosis not present

## 2019-10-26 DIAGNOSIS — K432 Incisional hernia without obstruction or gangrene: Secondary | ICD-10-CM | POA: Insufficient documentation

## 2019-10-26 DIAGNOSIS — K439 Ventral hernia without obstruction or gangrene: Secondary | ICD-10-CM | POA: Diagnosis not present

## 2019-10-26 DIAGNOSIS — K420 Umbilical hernia with obstruction, without gangrene: Secondary | ICD-10-CM

## 2019-10-26 DIAGNOSIS — Z79899 Other long term (current) drug therapy: Secondary | ICD-10-CM | POA: Diagnosis not present

## 2019-10-26 DIAGNOSIS — Z20822 Contact with and (suspected) exposure to covid-19: Secondary | ICD-10-CM | POA: Diagnosis not present

## 2019-10-26 DIAGNOSIS — R5381 Other malaise: Secondary | ICD-10-CM | POA: Diagnosis not present

## 2019-10-26 DIAGNOSIS — E876 Hypokalemia: Secondary | ICD-10-CM | POA: Diagnosis not present

## 2019-10-26 DIAGNOSIS — F411 Generalized anxiety disorder: Secondary | ICD-10-CM | POA: Diagnosis present

## 2019-10-26 DIAGNOSIS — I10 Essential (primary) hypertension: Secondary | ICD-10-CM | POA: Diagnosis not present

## 2019-10-26 DIAGNOSIS — F418 Other specified anxiety disorders: Secondary | ICD-10-CM | POA: Diagnosis present

## 2019-10-26 DIAGNOSIS — K429 Umbilical hernia without obstruction or gangrene: Secondary | ICD-10-CM | POA: Diagnosis not present

## 2019-10-26 LAB — CBC WITH DIFFERENTIAL/PLATELET
Abs Immature Granulocytes: 0.11 10*3/uL — ABNORMAL HIGH (ref 0.00–0.07)
Absolute Monocytes: 504 cells/uL (ref 200–950)
Basophils Absolute: 41 cells/uL (ref 0–200)
Basophils Absolute: 0.1 10*3/uL (ref 0.0–0.1)
Basophils Relative: 0.6 %
Basophils Relative: 0 %
Eosinophils Absolute: 48 cells/uL (ref 15–500)
Eosinophils Absolute: 0 10*3/uL (ref 0.0–0.5)
Eosinophils Relative: 0.7 %
Eosinophils Relative: 0 %
HCT: 41.9 % (ref 35.0–45.0)
HCT: 48 % — ABNORMAL HIGH (ref 36.0–46.0)
Hemoglobin: 13.8 g/dL (ref 11.7–15.5)
Hemoglobin: 16.4 g/dL — ABNORMAL HIGH (ref 12.0–15.0)
Immature Granulocytes: 1 %
Lymphocytes Relative: 9 %
Lymphs Abs: 1442 cells/uL (ref 850–3900)
Lymphs Abs: 1.9 10*3/uL (ref 0.7–4.0)
MCH: 30.7 pg (ref 27.0–33.0)
MCH: 30.9 pg (ref 26.0–34.0)
MCHC: 32.9 g/dL (ref 32.0–36.0)
MCHC: 34.2 g/dL (ref 30.0–36.0)
MCV: 93.3 fL (ref 80.0–100.0)
MCV: 90.4 fL (ref 80.0–100.0)
MPV: 9.3 fL (ref 7.5–12.5)
Monocytes Absolute: 1.1 10*3/uL — ABNORMAL HIGH (ref 0.1–1.0)
Monocytes Relative: 7.3 %
Monocytes Relative: 5 %
Neutro Abs: 4865 cells/uL (ref 1500–7800)
Neutro Abs: 18.9 10*3/uL — ABNORMAL HIGH (ref 1.7–7.7)
Neutrophils Relative %: 70.5 %
Neutrophils Relative %: 85 %
Platelets: 291 10*3/uL (ref 140–400)
Platelets: 442 10*3/uL — ABNORMAL HIGH (ref 150–400)
RBC: 4.49 10*6/uL (ref 3.80–5.10)
RBC: 5.31 MIL/uL — ABNORMAL HIGH (ref 3.87–5.11)
RDW: 13.1 % (ref 11.0–15.0)
RDW: 13.1 % (ref 11.5–15.5)
Total Lymphocyte: 20.9 %
WBC: 6.9 10*3/uL (ref 3.8–10.8)
WBC: 22.2 10*3/uL — ABNORMAL HIGH (ref 4.0–10.5)
nRBC: 0 % (ref 0.0–0.2)

## 2019-10-26 LAB — COMPLETE METABOLIC PANEL WITH GFR
AG Ratio: 1.7 (calc) (ref 1.0–2.5)
ALT: 23 U/L (ref 6–29)
AST: 20 U/L (ref 10–35)
Albumin: 3.7 g/dL (ref 3.6–5.1)
Alkaline phosphatase (APISO): 119 U/L (ref 37–153)
BUN/Creatinine Ratio: 4 (calc) — ABNORMAL LOW (ref 6–22)
BUN: 4 mg/dL — ABNORMAL LOW (ref 7–25)
CO2: 31 mmol/L (ref 20–32)
Calcium: 9.3 mg/dL (ref 8.6–10.4)
Chloride: 103 mmol/L (ref 98–110)
Creat: 0.93 mg/dL (ref 0.60–0.93)
GFR, Est African American: 72 mL/min/{1.73_m2} (ref >=60)
GFR, Est Non African American: 62 mL/min/{1.73_m2} (ref >=60)
Globulin: 2.2 g/dL (calc) (ref 1.9–3.7)
Glucose, Bld: 96 mg/dL (ref 65–99)
Potassium: 3.5 mmol/L (ref 3.5–5.3)
Sodium: 143 mmol/L (ref 135–146)
Total Bilirubin: 0.6 mg/dL (ref 0.2–1.2)
Total Protein: 5.9 g/dL — ABNORMAL LOW (ref 6.1–8.1)

## 2019-10-26 LAB — COMPREHENSIVE METABOLIC PANEL
ALT: 27 U/L (ref 0–44)
AST: 31 U/L (ref 15–41)
Albumin: 4.7 g/dL (ref 3.5–5.0)
Alkaline Phosphatase: 144 U/L — ABNORMAL HIGH (ref 38–126)
Anion gap: 15 (ref 5–15)
BUN: 7 mg/dL — ABNORMAL LOW (ref 8–23)
CO2: 24 mmol/L (ref 22–32)
Calcium: 10.1 mg/dL (ref 8.9–10.3)
Chloride: 97 mmol/L — ABNORMAL LOW (ref 98–111)
Creatinine, Ser: 1.43 mg/dL — ABNORMAL HIGH (ref 0.44–1.00)
GFR calc Af Amer: 43 mL/min — ABNORMAL LOW (ref >60)
GFR calc non Af Amer: 37 mL/min — ABNORMAL LOW (ref >60)
Glucose, Bld: 200 mg/dL — ABNORMAL HIGH (ref 70–99)
Potassium: 3.1 mmol/L — ABNORMAL LOW (ref 3.5–5.1)
Sodium: 136 mmol/L (ref 135–145)
Total Bilirubin: 0.9 mg/dL (ref 0.3–1.2)
Total Protein: 8.2 g/dL — ABNORMAL HIGH (ref 6.5–8.1)

## 2019-10-26 LAB — LIPID PANEL
Cholesterol: 167 mg/dL (ref <200)
HDL: 45 mg/dL — ABNORMAL LOW (ref >=50)
LDL Cholesterol (Calc): 99 mg/dL (calc)
Non-HDL Cholesterol (Calc): 122 mg/dL (calc) (ref <130)
Total CHOL/HDL Ratio: 3.7 (calc) (ref <5.0)
Triglycerides: 132 mg/dL (ref <150)

## 2019-10-26 LAB — THYROID PANEL WITH TSH
Free Thyroxine Index: 3.5 (ref 1.4–3.8)
T3 Uptake: 30 % (ref 22–35)
T4, Total: 11.7 ug/dL (ref 5.1–11.9)
TSH: 1.44 mIU/L (ref 0.40–4.50)

## 2019-10-26 LAB — LACTIC ACID, PLASMA: Lactic Acid, Venous: 1.7 mmol/L (ref 0.5–1.9)

## 2019-10-26 LAB — LIPASE, BLOOD: Lipase: 74 U/L — ABNORMAL HIGH (ref 11–51)

## 2019-10-26 MED ORDER — LORAZEPAM 2 MG/ML IJ SOLN
0.5000 mg | INTRAMUSCULAR | Status: AC | PRN
  Administered 2019-10-27 – 2019-10-29 (×4): 0.5 mg via INTRAVENOUS
  Filled 2019-10-26 (×5): qty 1

## 2019-10-26 MED ORDER — SODIUM CHLORIDE 0.9 % IV BOLUS
1000.0000 mL | Freq: Once | INTRAVENOUS | Status: AC
  Administered 2019-10-26: 1000 mL via INTRAVENOUS

## 2019-10-26 MED ORDER — ONDANSETRON HCL 4 MG/2ML IJ SOLN
4.0000 mg | Freq: Once | INTRAMUSCULAR | Status: AC
  Administered 2019-10-26: 4 mg via INTRAVENOUS
  Filled 2019-10-26: qty 2

## 2019-10-26 MED ORDER — LABETALOL HCL 5 MG/ML IV SOLN
10.0000 mg | Freq: Four times a day (QID) | INTRAVENOUS | Status: DC | PRN
  Administered 2019-10-29: 10 mg via INTRAVENOUS
  Filled 2019-10-26: qty 4

## 2019-10-26 MED ORDER — ACETAMINOPHEN 325 MG PO TABS
650.0000 mg | ORAL_TABLET | Freq: Four times a day (QID) | ORAL | Status: DC | PRN
  Administered 2019-10-31 – 2019-11-01 (×3): 650 mg via ORAL
  Filled 2019-10-26 (×3): qty 2

## 2019-10-26 MED ORDER — MORPHINE SULFATE (PF) 4 MG/ML IV SOLN
4.0000 mg | Freq: Once | INTRAVENOUS | Status: AC
  Administered 2019-10-26: 4 mg via INTRAVENOUS
  Filled 2019-10-26: qty 1

## 2019-10-26 MED ORDER — IOHEXOL 300 MG/ML  SOLN
100.0000 mL | Freq: Once | INTRAMUSCULAR | Status: AC | PRN
  Administered 2019-10-26: 100 mL via INTRAVENOUS

## 2019-10-26 MED ORDER — LACTATED RINGERS IV SOLN: INTRAVENOUS | Status: DC

## 2019-10-26 MED ORDER — PIPERACILLIN-TAZOBACTAM 3.375 G IVPB 30 MIN: 3.3750 g | Freq: Once | INTRAVENOUS | Status: DC

## 2019-10-26 MED ORDER — ACETAMINOPHEN 650 MG RE SUPP: 650.0000 mg | Freq: Four times a day (QID) | RECTAL | Status: DC | PRN

## 2019-10-26 MED ORDER — MORPHINE SULFATE (PF) 2 MG/ML IV SOLN
2.0000 mg | INTRAVENOUS | Status: DC | PRN
  Administered 2019-10-27 – 2019-10-31 (×3): 2 mg via INTRAVENOUS
  Filled 2019-10-26 (×4): qty 1

## 2019-10-26 NOTE — ED Triage Notes (Signed)
Pt from home via [facility] with complaint of abdominal pain (central) worsening today. Pt with abdominal history of hiatal and umbilical hernia. History of previous bowel perf  Pt began vomitting en route, mildly diaphoretic on arrival

## 2019-10-26 NOTE — ED Provider Notes (Signed)
[facility] Emergency Department Provider Note  ____________________________________________   First MD Initiated Contact with Patient 10/26/19 2025     (approximate)  I have reviewed the triage vital signs and the nursing notes.   HISTORY  Chief Complaint Abdominal Pain    HPI Katrina Sutton is a 69 y.o. female  Here with severe abdominal pain, nausea, and vomiting. Pt was just hospitalized for SBO,  discharged last week. She was managed conservatively. Since then, pt has had been "doing okay" with mild abd pain. She has been eating small meals. Over the past 24 hours, she has developed recurrent, severe abdominal pain along with nausea, vomiting. She has been unable to eat/drink. She's had some loose stool out today but no formed stool. Vomit is dark green. No fever, chills. No dysuria. Sx feel similar to her SBO. Pain is aching, gnawing, diffuse, worse with palpation and eating attempts. No alleviating factors.       Past Medical History:  Diagnosis Date  . Allergy    seasonal  . Anxiety   . Arthritis    osteoarthritis  . Depression   . GERD (gastroesophageal reflux disease)   . Hiatal hernia     Patient Active Problem List   Diagnosis Date Noted  . SBO (small bowel obstruction) (HCC) 10/26/2019  . Recurrent ventral hernia 10/26/2019  . AKI (acute kidney injury) (HCC) 10/26/2019  . Hospital discharge follow-up 10/25/2019  . Hypokalemia 10/25/2019  . Hypertension 08/30/2019  . Leg swelling 05/08/2019  . Acute respiratory failure with hypoxia (HCC) 05/08/2019  . Paraesophageal hernia 05/08/2019  . Constipation 04/17/2019  . S/P umbilical hernia repair, follow-up exam 06/21/2018  . S/P laparoscopic cholecystectomy 05/31/2018  . Gallstone pancreatitis   . Umbilical hernia   . Dyspnea 03/11/2018  . Bipolar 1 disorder (HCC) 08/23/2017  . GERD (gastroesophageal reflux disease) 12/27/2016  . Arthritis 12/27/2016  . Anxiety 12/27/2016  .  Depression with anxiety 12/27/2016  . Seasonal allergic rhinitis 12/27/2016    Past Surgical History:  Procedure Laterality Date  . CHOLECYSTECTOMY N/A 05/31/2018   Procedure: LAPAROSCOPIC CHOLECYSTECTOMY;  Surgeon: Duanne Guess, MD;  Location: [facility] ORS;  Service: General;  Laterality: N/A;  . TONSILLECTOMY    . UMBILICAL HERNIA REPAIR N/A 05/31/2018   Procedure: HERNIA REPAIR UMBILICAL ADULT;  Surgeon: Duanne Guess, MD;  Location: [facility] ORS;  Service: General;  Laterality: N/A;  . WRIST SURGERY Left     Prior to Admission medications   Medication Sig Start Date End Date Taking? Authorizing Provider  acetaminophen (TYLENOL) 500 MG tablet Take 500 mg by mouth every 8 (eight) hours as needed for headache.   Yes [provider]  clonazePAM (KLONOPIN) 1 MG tablet TAKE 1 TABLET BY MOUTH TWICE A DAY AS NEEDED ANXIETY 10/04/19  Yes Malfi, Jodelle Gross, FNP  fluticasone (FLONASE) 50 MCG/ACT nasal spray Place 1 spray into both nostrils daily as needed. 10/04/19  Yes [provider]  hydrocortisone cream 1 % Apply 1 application topically 2 (two) times daily as needed for itching.    Yes [provider]  linaclotide Karlene Einstein) 145 MCG CAPS capsule Take 1 capsule (145 mcg total) by mouth daily before breakfast. 07/05/19 01/01/20 Yes Wyline Mood, MD  lisinopril (ZESTRIL) 10 MG tablet Take 1 tablet (10 mg total) by mouth daily. 09/13/19  Yes Malfi, Jodelle Gross, FNP  omeprazole (PRILOSEC) 20 MG capsule Take 1 capsule (20 mg total) by mouth daily. In evening 06/29/19  Yes Malfi, Jodelle Gross, FNP  omeprazole (PRILOSEC) 40 MG capsule Take 1 capsule (40 mg total) by mouth daily. In morning 06/29/19  Yes Malfi, Jodelle Gross, FNP  PARoxetine (PAXIL) 40 MG tablet TAKE 1 TABLET BY MOUTH DAILY 10/26/19  Yes Karamalegos, Netta Neat, DO  polyethylene glycol (MIRALAX / GLYCOLAX) 17 g packet Take 17 g by mouth daily. 10/18/19  Yes Darlin Priestly, MD  polyethylene glycol powder (GLYCOLAX/MIRALAX) 17 GM/SCOOP powder  Take 17 g by mouth 2 (two) times daily as needed. 10/25/19  Yes Malfi, Jodelle Gross, FNP  sodium chloride (OCEAN) 0.65 % SOLN nasal spray Place 1 spray into both nostrils as needed for congestion.   Yes [provider]    Allergies Erythromycin and Duloxetine  Family History  Problem Relation Age of Onset  . Mental illness Mother   . Ulcers Father   . Stroke Neg Hx   . Heart attack Neg Hx   . Cancer Neg Hx     Social History Social History   Tobacco Use  . Smoking status: Former Smoker    Quit date: 12/27/1976    Years since quitting: 42.8  . Smokeless tobacco: Never Used  Vaping Use  . Vaping Use: Never used  Substance Use Topics  . Alcohol use: No  . Drug use: No    Review of Systems  Review of Systems  Constitutional: Positive for fatigue. Negative for fever.  HENT: Negative for congestion and sore throat.   Eyes: Negative for visual disturbance.  Respiratory: Negative for cough and shortness of breath.   Cardiovascular: Negative for chest pain.  Gastrointestinal: Positive for abdominal pain, nausea and vomiting. Negative for diarrhea.  Genitourinary: Negative for flank pain.  Musculoskeletal: Negative for back pain and neck pain.  Skin: Negative for rash and wound.  Neurological: Positive for weakness.  All other systems reviewed and are negative.    ____________________________________________  PHYSICAL EXAM:      VITAL SIGNS: ED Triage Vitals  Enc Vitals Group     BP 10/26/19 2023 114/80     Pulse Rate 10/26/19 2023 91     Resp 10/26/19 2023 (!) 29     Temp 10/26/19 2023 97.6 F (36.4 C)     Temp Source 10/26/19 2023 Oral     SpO2 10/26/19 2023 93 %     Weight 10/26/19 2021 167 lb 8.8 oz (76 kg)     Height 10/26/19 2021  (1.676 m)     Head Circumference --      Peak Flow --      Pain Score 10/26/19 2021 9     Pain Loc --      Pain Edu? --      Excl. in GC? --      Physical Exam Vitals and nursing note reviewed.  Constitutional:        General: She is not in acute distress.    Appearance: She is well-developed.  HENT:     Head: Normocephalic and atraumatic.     Comments: Dry mucous membranes Eyes:     Conjunctiva/sclera: Conjunctivae normal.  Cardiovascular:     Rate and Rhythm: Normal rate and regular rhythm.     Heart sounds: Normal heart sounds. No murmur heard.  No friction rub.  Pulmonary:     Effort: Pulmonary effort is normal. No respiratory distress.     Breath sounds: Normal breath sounds. No wheezing or rales.  Abdominal:     General: Bowel sounds are decreased. There is distension.  Palpations: Abdomen is soft.     Tenderness: There is generalized abdominal tenderness. There is guarding and rebound.  Musculoskeletal:     Cervical back: Neck supple.  Skin:    General: Skin is warm.     Capillary Refill: Capillary refill takes less than 2 seconds.  Neurological:     Mental Status: She is alert and oriented to person, place, and time.     Motor: No abnormal muscle tone.       ____________________________________________   LABS (all labs ordered are listed, but only abnormal results are displayed)  Labs Reviewed  CBC WITH DIFFERENTIAL/PLATELET - Abnormal; Notable for the following components:      Result Value   WBC 22.2 (*)    RBC 5.31 (*)    Hemoglobin 16.4 (*)    HCT 48.0 (*)    Platelets 442 (*)    Neutro Abs 18.9 (*)    Monocytes Absolute 1.1 (*)    Abs Immature Granulocytes 0.11 (*)    All other components within normal limits  COMPREHENSIVE METABOLIC PANEL - Abnormal; Notable for the following components:   Potassium 3.1 (*)    Chloride 97 (*)    Glucose, Bld 200 (*)    BUN 7 (*)    Creatinine, Ser 1.43 (*)    Total Protein 8.2 (*)    Alkaline Phosphatase 144 (*)    GFR calc non Af Amer 37 (*)    GFR calc Af Amer 43 (*)    All other components within normal limits  LIPASE, BLOOD - Abnormal; Notable for the following components:   Lipase 74 (*)    All other components  within normal limits  COMPREHENSIVE METABOLIC PANEL - Abnormal; Notable for the following components:   Potassium 3.0 (*)    Glucose, Bld 135 (*)    BUN 7 (*)    Creatinine, Ser 1.16 (*)    Calcium 8.8 (*)    Alkaline Phosphatase 128 (*)    GFR calc non Af Amer 47 (*)    GFR calc Af Amer 55 (*)    All other components within normal limits  CBC - Abnormal; Notable for the following components:   WBC 20.6 (*)    All other components within normal limits  SARS CORONAVIRUS 2 BY RT PCR (HOSPITAL ORDER, PERFORMED IN [facility])  LACTIC ACID, PLASMA  LACTIC ACID, PLASMA    ____________________________________________  EKG: Normal sinus ryhthm, VR 97. QRS 106, QTc 462. No acute ST elevations or depressions. No ischemia or infarct. ________________________________________  RADIOLOGY All imaging, including plain films, CT scans, and ultrasounds, independently reviewed by me, and interpretations confirmed via formal radiology reads.  ED MD interpretation:   CT A/P: SBO due to umbilical hernia, large hiatal hernia again noted, no perforation  Official radiology report(s): CT ABDOMEN PELVIS W CONTRAST  Result Date: 10/26/2019 CLINICAL DATA:  Abdominal distension EXAM: CT ABDOMEN AND PELVIS WITH CONTRAST TECHNIQUE: Multidetector CT imaging of the abdomen and pelvis was performed using the standard protocol following bolus administration of intravenous contrast. CONTRAST:  OMNIPAQUE IOHEXOL 300 MG/ML  SOLN COMPARISON:  10/12/2019 FINDINGS: Lower chest: Large hiatal hernia in the right lower chest. This includes much of the stomach and a portion of the transverse colon. Right base atelectasis. Hepatobiliary: Small hypodensity in the right hepatic lobe, stable since prior study, likely cysts. Prior cholecystectomy. Pancreas: No focal abnormality or ductal dilatation. Spleen: No focal abnormality.  Normal size. Adrenals/Urinary Tract: No adrenal abnormality.  No focal renal  abnormality. No stones or hydronephrosis. Urinary bladder is unremarkable. Stomach/Bowel: Dilated fluid-filled small bowel loops are seen in the abdomen and pelvis bleeding to a small umbilical hernia containing a small bowel loop compatible with small bowel obstruction. Distal to the hernia, small bowel is decompressed. Appendix and colon are unremarkable. Vascular/Lymphatic: No evidence of aneurysm or adenopathy. Reproductive: Uterus and adnexa unremarkable.  No mass. Other: No free fluid or free air. Musculoskeletal: No acute bony abnormality. IMPRESSION: Small bowel obstruction due to umbilical hernia containing a small bowel loop. This is similar to prior study. Large hiatal hernia containing much of the stomach and a portion of the mid transverse colon. Electronically Signed   By: Charlett Nose M.D.   On: 10/26/2019 21:15    ____________________________________________  PROCEDURES   Procedure(s) performed (including Critical Care):  .1-3 Lead EKG Interpretation Performed by: Shaune Pollack, MD Authorized by: Shaune Pollack, MD     Interpretation: normal     ECG rate:  80-100   ECG rate assessment: normal     Rhythm: sinus rhythm     Ectopy: none     Conduction: normal   Comments:     Indication: Vomiting, small bowel obstruction, receiving fluids and analgesia    ____________________________________________  INITIAL IMPRESSION / MDM / ASSESSMENT AND PLAN / ED COURSE  As part of my medical decision making, I reviewed the following data within the electronic MEDICAL RECORD NUMBER Nursing notes reviewed and incorporated, Old chart reviewed, Notes from prior ED visits, and [place] Controlled Substance Database       *Katrina Sutton was evaluated in Emergency Department on 10/27/2019 for the symptoms described in the history of present illness. She was evaluated in the context of the global COVID-19 pandemic, which necessitated consideration that the patient might be at risk for infection with  the SARS-CoV-2 virus that causes COVID-19. Institutional protocols and algorithms that pertain to the evaluation of patients at risk for COVID-19 are in a state of rapid change based on information released by regulatory bodies including the CDC and federal and state organizations. These policies and algorithms were followed during the patient's care in the ED.  Some ED evaluations and interventions may be delayed as a result of limited staffing during the pandemic.*     Medical Decision Making:  69 yo F here with recurrent small bowel obstruction. On arrival, pt in moderate distress, vomiting. IVF, analgesia ordered and stat CT obtained. CT scan shows significant SBO likely related to ventral hernia, no perforation or abscess. Lab work reveals likely reactive leukocytosis, mild AKI likely 2/2 vomiting and dehydration. IVF, admit. Discussed with Dr. Claudine Mouton, admit to Hospitalist.  ____________________________________________  FINAL CLINICAL IMPRESSION(S) / ED DIAGNOSES  Final diagnoses:  Small bowel obstruction (HCC)     MEDICATIONS GIVEN DURING THIS VISIT:  Medications  labetalol (NORMODYNE) injection 10 mg (has no administration in time range)  LORazepam (ATIVAN) injection 0.5 mg (has no administration in time range)  lactated ringers infusion ( Intravenous New Bag/Given 10/26/19 2308)  morphine 2 MG/ML injection 2 mg (has no administration in time range)  acetaminophen (TYLENOL) tablet 650 mg (has no administration in time range)    Or  acetaminophen (TYLENOL) suppository 650 mg (has no administration in time range)  sodium chloride 0.9 % bolus 1,000 mL (0 mLs Intravenous Stopped 10/26/19 2242)  morphine 4 MG/ML injection 4 mg (4 mg Intravenous Given 10/26/19 2051)  ondansetron (ZOFRAN) injection 4 mg (4 mg Intravenous Given 10/26/19  2051)  iohexol (OMNIPAQUE) 300 MG/ML solution 100 mL (100 mLs Intravenous Contrast Given 10/26/19 2056)  ondansetron (ZOFRAN) injection 4 mg (4 mg  Intravenous Given 10/26/19 2203)     ED Discharge Orders    None       Note:  This document was prepared using Dragon voice recognition software and may include unintentional dictation errors.   Shaune PollackIsaacs, Jannifer Fischler, MD 10/27/19 [phone]

## 2019-10-26 NOTE — H&P (Signed)
History and Physical    Katrina Sutton NWG:[id number] DOB: 04/14/1950 DOA: 10/26/2019  PCP: Tarri FullerMalfi, Nicole M, FNP   Patient coming from: Home  I have personally briefly [facility]  Chief Complaint: Abdominal pain, vomiting  HPI: Katrina Sutton is a 69 y.o. female with medical history significant for large paraesophageal hernia, ventral hernia status post repair 2 years prior with recurrent admissions for partial small bowel obstruction, treated conservatively, most recently from 7/29 to 8/5 as well as history of HTN, depression and anxiety, who presents with a 24-hour history of recurrent severe abdominal pain associated with bilious vomiting with inability to hold anything down.  Has loose stool, small amounts.  Pain is severe, colicky, and periumbilical area, nonradiating, with no aggravating or alleviating factors.  Denies associated fever or chills.  Denies chest pain or shortness of breath and denies dysuria. ED Course: On arrival she was mildly tachycardic at 91, tachypneic at 29, afebrile with BP 114/80.  Blood work significant for WBC of 22,000, creatinine 1.43 above baseline of 0.91.  Lactic acid 1.7.  Lipase slightly elevated at 74.  The emergency room provider spoke with surgeon, Dr. Claudine Moutonodenberg who will see patient and may consider surgery in the a.m.  Review of Systems: As per HPI otherwise all other systems on review of systems negative.    Past Medical History:  Diagnosis Date  . Allergy    seasonal  . Anxiety   . Arthritis    osteoarthritis  . Depression   . GERD (gastroesophageal reflux disease)   . Hiatal hernia     Past Surgical History:  Procedure Laterality Date  . CHOLECYSTECTOMY N/A 05/31/2018   Procedure: LAPAROSCOPIC CHOLECYSTECTOMY;  Surgeon: Duanne Guessannon, Jennifer, MD;  Location: [facility] ORS;  Service: General;  Laterality: N/A;  . TONSILLECTOMY    . UMBILICAL HERNIA REPAIR N/A 05/31/2018   Procedure: HERNIA REPAIR UMBILICAL  ADULT;  Surgeon: Duanne Guessannon, Jennifer, MD;  Location: [facility] ORS;  Service: General;  Laterality: N/A;  . WRIST SURGERY Left      reports that she quit smoking about 42 years ago. She has never used smokeless tobacco. She reports that she does not drink alcohol and does not use drugs.  Allergies  Allergen Reactions  . Erythromycin Shortness Of Breath and Rash  . Duloxetine Palpitations    Family History  Problem Relation Age of Onset  . Mental illness Mother   . Ulcers Father   . Stroke Neg Hx   . Heart attack Neg Hx   . Cancer Neg Hx       Prior to Admission medications   Medication Sig Start Date End Date Taking? Authorizing Provider  acetaminophen (TYLENOL) 500 MG tablet Take 500 mg by mouth every 8 (eight) hours as needed for headache.    [provider]  clonazePAM (KLONOPIN) 1 MG tablet TAKE 1 TABLET BY MOUTH TWICE A DAY AS NEEDED ANXIETY 10/04/19   Malfi, Jodelle GrossNicole M, FNP  fluticasone (FLONASE) 50 MCG/ACT nasal spray Place 1 spray into both nostrils daily as needed. 10/04/19   [provider]  hydrocortisone cream 1 % Apply 1 application topically 2 (two) times daily as needed for itching.     [provider]  linaclotide Karlene Einstein(LINZESS) 145 MCG CAPS capsule Take 1 capsule (145 mcg total) by mouth daily before breakfast. 07/05/19 01/01/20  Wyline MoodAnna, Kiran, MD  lisinopril (ZESTRIL) 10 MG tablet Take 1 tablet (10 mg total) by mouth daily. 09/13/19  Malfi, Jodelle Gross, FNP  omeprazole (PRILOSEC) 20 MG capsule Take 1 capsule (20 mg total) by mouth daily. In evening 06/29/19   Malfi, Jodelle Gross, FNP  omeprazole (PRILOSEC) 40 MG capsule Take 1 capsule (40 mg total) by mouth daily. In morning 06/29/19   Tarri Fuller, FNP  PARoxetine (PAXIL) 40 MG tablet TAKE 1 TABLET BY MOUTH DAILY 10/26/19   Karamalegos, Netta Neat, DO  polyethylene glycol (MIRALAX / GLYCOLAX) 17 g packet Take 17 g by mouth daily. 10/18/19   Darlin Priestly, MD  polyethylene glycol powder (GLYCOLAX/MIRALAX) 17 GM/SCOOP  powder Take 17 g by mouth 2 (two) times daily as needed. 10/25/19   Malfi, Jodelle Gross, FNP  sodium chloride (OCEAN) 0.65 % SOLN nasal spray Place 1 spray into both nostrils as needed for congestion.    [provider]    Physical Exam: Vitals:   10/26/19 2045 10/26/19 2115 10/26/19 2124 10/26/19 2230  BP:    140/84  Pulse: 92 93 88 85  Resp: (!) 30  (!) 29 17  Temp:      TempSrc:      SpO2: 91% (!) 89% 94% 92%  Weight:      Height:         Vitals:   10/26/19 2045 10/26/19 2115 10/26/19 2124 10/26/19 2230  BP:    140/84  Pulse: 92 93 88 85  Resp: (!) 30  (!) 29 17  Temp:      TempSrc:      SpO2: 91% (!) 89% 94% 92%  Weight:      Height:          Constitutional: Alert and oriented x 3 . Not in any apparent distress HEENT:      Head: Normocephalic and atraumatic.         Eyes: PERLA, EOMI, Conjunctivae are normal. Sclera is non-icteric.       Mouth/Throat: Mucous membranes are moist.       Neck: Supple with no signs of meningismus. Cardiovascular: Regular rate and rhythm. No murmurs, gallops, or rubs. 2+ symmetrical distal pulses are present . No JVD. No LE edema Respiratory: Respiratory effort normal .Lungs sounds clear bilaterally. No wheezes, crackles, or rhonchi.  Gastrointestinal: tender around unbilicus, mild distention with hypoactive bowel sounds. Genitourinary: No CVA tenderness. Musculoskeletal: Nontender with normal range of motion in all extremities. No cyanosis, or erythema of extremities. Neurologic: Normal speech and language. Face is symmetric. Moving all extremities. No gross focal neurologic deficits . Skin: Skin is warm, dry.  No rash or ulcers Psychiatric: Mood and affect are normal Speech and behavior are normal   Labs on Admission: I have personally reviewed following labs and imaging studies  CBC: Recent Labs  Lab 10/25/19 1003 10/26/19 2037  WBC 6.9 22.2*  NEUTROABS 4,865 18.9*  HGB 13.8 16.4*  HCT 41.9 48.0*  MCV 93.3 90.4  PLT  291 442*   Basic Metabolic Panel: Recent Labs  Lab 10/25/19 1003 10/26/19 2037  NA 143 136  K 3.5 3.1*  CL 103 97*  CO2 31 24  GLUCOSE 96 200*  BUN 4* 7*  CREATININE 0.93 1.43*  CALCIUM 9.3 10.1   GFR: Estimated Creatinine Clearance: 37.6 mL/min (A) (by C-G formula based on SCr of 1.43 mg/dL (H)). Liver Function Tests: Recent Labs  Lab 10/25/19 1003 10/26/19 2037  AST 20 31  ALT 23 27  ALKPHOS  --  144*  BILITOT 0.6 0.9  PROT 5.9* 8.2*  ALBUMIN  --  4.7   Recent Labs  Lab 10/26/19 2037  LIPASE 74*   No results for input(s): AMMONIA in the last 168 hours. Coagulation Profile: No results for input(s): INR, PROTIME in the last 168 hours. Cardiac Enzymes: No results for input(s): CKTOTAL, CKMB, CKMBINDEX, TROPONINI in the last 168 hours. BNP (last 3 results) No results for input(s): PROBNP in the last 8760 hours. HbA1C: No results for input(s): HGBA1C in the last 72 hours. CBG: No results for input(s): GLUCAP in the last 168 hours. Lipid Profile: Recent Labs    10/25/19 1003  CHOL 167  HDL 45*  LDLCALC 99  TRIG 833  CHOLHDL 3.7   Thyroid Function Tests: Recent Labs    10/25/19 1003  TSH 1.44  T4TOTAL 11.7   Anemia Panel: No results for input(s): VITAMINB12, FOLATE, FERRITIN, TIBC, IRON, RETICCTPCT in the last 72 hours. Urine analysis:    Component Value Date/Time   COLORURINE YELLOW (A) 10/13/2019 0925   APPEARANCEUR HAZY (A) 10/13/2019 0925   APPEARANCEUR Clear 06/28/2011 1412   LABSPEC 1.030 10/13/2019 0925   LABSPEC 1.005 06/28/2011 1412   PHURINE 5.0 10/13/2019 0925   GLUCOSEU NEGATIVE 10/13/2019 0925   GLUCOSEU Negative 06/28/2011 1412   HGBUR SMALL (A) 10/13/2019 0925   BILIRUBINUR NEGATIVE 10/13/2019 0925   BILIRUBINUR neg 09/13/2019 0850   BILIRUBINUR Negative 06/28/2011 1412   KETONESUR 5 (A) 10/13/2019 0925   PROTEINUR 30 (A) 10/13/2019 0925   UROBILINOGEN 0.2 09/13/2019 0850   NITRITE NEGATIVE 10/13/2019 0925   LEUKOCYTESUR  MODERATE (A) 10/13/2019 0925   LEUKOCYTESUR Negative 06/28/2011 1412    Radiological Exams on Admission: CT ABDOMEN PELVIS W CONTRAST  Result Date: 10/26/2019 CLINICAL DATA:  Abdominal distension EXAM: CT ABDOMEN AND PELVIS WITH CONTRAST TECHNIQUE: Multidetector CT imaging of the abdomen and pelvis was performed using the standard protocol following bolus administration of intravenous contrast. CONTRAST:  OMNIPAQUE IOHEXOL 300 MG/ML  SOLN COMPARISON:  10/12/2019 FINDINGS: Lower chest: Large hiatal hernia in the right lower chest. This includes much of the stomach and a portion of the transverse colon. Right base atelectasis. Hepatobiliary: Small hypodensity in the right hepatic lobe, stable since prior study, likely cysts. Prior cholecystectomy. Pancreas: No focal abnormality or ductal dilatation. Spleen: No focal abnormality.  Normal size. Adrenals/Urinary Tract: No adrenal abnormality. No focal renal abnormality. No stones or hydronephrosis. Urinary bladder is unremarkable. Stomach/Bowel: Dilated fluid-filled small bowel loops are seen in the abdomen and pelvis bleeding to a small umbilical hernia containing a small bowel loop compatible with small bowel obstruction. Distal to the hernia, small bowel is decompressed. Appendix and colon are unremarkable. Vascular/Lymphatic: No evidence of aneurysm or adenopathy. Reproductive: Uterus and adnexa unremarkable.  No mass. Other: No free fluid or free air. Musculoskeletal: No acute bony abnormality. IMPRESSION: Small bowel obstruction due to umbilical hernia containing a small bowel loop. This is similar to prior study. Large hiatal hernia containing much of the stomach and a portion of the mid transverse colon. Electronically Signed   By: Charlett Nose M.D.   On: 10/26/2019 21:15    EKG: Independently reviewed. Interpretation : Normal sinus rhythm with no acute ST-T wave changes  Assessment/Plan 69 year old female with history of large paraesophageal  hernia, ventral hernia status post repair 2 years prior with recurrent admissions for partial small bowel obstruction, treated conservatively, most recently from 7/29 to 8/5 as well as history of HTN, depression and anxiety, who presents recurrent SBO   SBO (small bowel obstruction) in the setting of  recurrent intestinal obstruction due to recurrent umbilical hernia, status post repair 2018 -Recently discharged on 8/5 for ventral hernia responding to conservative management presenting with 24-hour onset severe abdominal pain with bilious vomiting, WBC 22,000 -NG tube -Pain management, antiemetics -Surgery consulted, Dr. Claudine Mouton  Acute kidney injury -Creatinine 1.43 above baseline of 0.91 -Prerenal secondary to SBO -Hydrate and monitor renal function    Depression with anxiety -Hold home meds as patient n.p.o. but will give low-dose IV Ativan as needed anxiety    Paraesophageal hernia -Management per surgery    Hypertension -Labetalol IV as needed SBP over 160 while n.p.o.  DVT prophylaxis: SCDs Code Status: full code  Family Communication:  none  Disposition Plan: Back to previous home environment Consults called: Surgeon, Dr. Claudine Mouton Status:At the time of admission, it appears that the appropriate admission status for this patient is INPATIENT. This is judged to be reasonable and necessary in order to provide the required intensity of service to ensure the patient's safety given the presenting symptoms, physical exam findings, and initial radiographic and laboratory data in the context of their  Comorbid conditions.   Patient requires inpatient status due to high intensity of service, high risk for further deterioration and high frequency of surveillance required.   I certify that at the point of admission it is my clinical judgment that the patient will require inpatient hospital care spanning beyond 2 midnights     Andris Baumann MD [facility]     10/26/2019,  10:57 PM

## 2019-10-27 ENCOUNTER — Inpatient Hospital Stay: Payer: Medicare HMO

## 2019-10-27 DIAGNOSIS — K56609 Unspecified intestinal obstruction, unspecified as to partial versus complete obstruction: Secondary | ICD-10-CM

## 2019-10-27 DIAGNOSIS — I10 Essential (primary) hypertension: Secondary | ICD-10-CM

## 2019-10-27 DIAGNOSIS — N179 Acute kidney failure, unspecified: Secondary | ICD-10-CM

## 2019-10-27 DIAGNOSIS — K420 Umbilical hernia with obstruction, without gangrene: Secondary | ICD-10-CM

## 2019-10-27 LAB — COMPREHENSIVE METABOLIC PANEL
ALT: 29 U/L (ref 0–44)
AST: 35 U/L (ref 15–41)
Albumin: 3.8 g/dL (ref 3.5–5.0)
Alkaline Phosphatase: 128 U/L — ABNORMAL HIGH (ref 38–126)
Anion gap: 12 (ref 5–15)
BUN: 7 mg/dL — ABNORMAL LOW (ref 8–23)
CO2: 25 mmol/L (ref 22–32)
Calcium: 8.8 mg/dL — ABNORMAL LOW (ref 8.9–10.3)
Chloride: 103 mmol/L (ref 98–111)
Creatinine, Ser: 1.16 mg/dL — ABNORMAL HIGH (ref 0.44–1.00)
GFR calc Af Amer: 55 mL/min — ABNORMAL LOW (ref >60)
GFR calc non Af Amer: 47 mL/min — ABNORMAL LOW (ref >60)
Glucose, Bld: 135 mg/dL — ABNORMAL HIGH (ref 70–99)
Potassium: 3 mmol/L — ABNORMAL LOW (ref 3.5–5.1)
Sodium: 140 mmol/L (ref 135–145)
Total Bilirubin: 0.9 mg/dL (ref 0.3–1.2)
Total Protein: 6.8 g/dL (ref 6.5–8.1)

## 2019-10-27 LAB — CBC
HCT: 42.2 % (ref 36.0–46.0)
Hemoglobin: 14.7 g/dL (ref 12.0–15.0)
MCH: 30.8 pg (ref 26.0–34.0)
MCHC: 34.8 g/dL (ref 30.0–36.0)
MCV: 88.5 fL (ref 80.0–100.0)
Platelets: 319 10*3/uL (ref 150–400)
RBC: 4.77 MIL/uL (ref 3.87–5.11)
RDW: 13.2 % (ref 11.5–15.5)
WBC: 20.6 10*3/uL — ABNORMAL HIGH (ref 4.0–10.5)
nRBC: 0 % (ref 0.0–0.2)

## 2019-10-27 LAB — SARS CORONAVIRUS 2 BY RT PCR (HOSPITAL ORDER, PERFORMED IN ~~LOC~~ HOSPITAL LAB): SARS Coronavirus 2: NEGATIVE

## 2019-10-27 LAB — LACTIC ACID, PLASMA: Lactic Acid, Venous: 0.9 mmol/L (ref 0.5–1.9)

## 2019-10-27 MED ORDER — ONDANSETRON HCL 4 MG/2ML IJ SOLN: 4.0000 mg | Freq: Four times a day (QID) | INTRAMUSCULAR | Status: DC | PRN

## 2019-10-27 MED ORDER — POTASSIUM CHLORIDE 10 MEQ/100ML IV SOLN
10.0000 meq | INTRAVENOUS | Status: AC
  Administered 2019-10-27 (×2): 10 meq via INTRAVENOUS
  Filled 2019-10-27 (×2): qty 100

## 2019-10-27 MED ORDER — KCL IN DEXTROSE-NACL 20-5-0.45 MEQ/L-%-% IV SOLN
INTRAVENOUS | Status: DC
  Filled 2019-10-27 (×7): qty 1000

## 2019-10-27 NOTE — Progress Notes (Signed)
Patient was disconnecting NG tube when using the bathroom and attempting to reconnect once finished.  Encouraged the patient to call nursing staff to have NG tube disconnected and reconnected.

## 2019-10-27 NOTE — Plan of Care (Signed)
Continuing with plan of care. 

## 2019-10-27 NOTE — Consult Note (Signed)
[facility] SURGICAL CONSULTATION NOTE   HISTORY OF PRESENT ILLNESS (HPI):  69 y.o. female presented to [facility] ED yesterday evening, well-known to our service for similar prior presentation and had only been out of the hospital about a week for evaluation of recurring small bowel obstruction with associated umbilical hernia with giant paraesophageal hernia with stomach and portion of transverse colon and right chest. Patient reports feeling much better today with NG tube placement.  Denies periumbilical tenderness or pain.  Denies crampy pain, persisting nausea or vomiting.  Reports flatus and liquid bowel movement this morning. Patient admits/regrets having deferred pursuing her work-up to prepare for her hiatal hernia repair.  Now having recurrent issues with her recurrent umbilical hernia.  She is notes this may all been avoided if she had followed through. Surgery is consulted by hospitalist physician Dr. Lindajo RoyalHazel Duncan in this context for evaluation and management of small bowel obstruction, umbilical and hiatal hernias.  PAST MEDICAL HISTORY (PMH):  Past Medical History:  Diagnosis Date  . Allergy    seasonal  . Anxiety   . Arthritis    osteoarthritis  . Depression   . GERD (gastroesophageal reflux disease)   . Hiatal hernia      PAST SURGICAL HISTORY (PSH):  Past Surgical History:  Procedure Laterality Date  . CHOLECYSTECTOMY N/A 05/31/2018   Procedure: LAPAROSCOPIC CHOLECYSTECTOMY;  Surgeon: Duanne Guessannon, Jennifer, MD;  Location: [facility] ORS;  Service: General;  Laterality: N/A;  . TONSILLECTOMY    . UMBILICAL HERNIA REPAIR N/A 05/31/2018   Procedure: HERNIA REPAIR UMBILICAL ADULT;  Surgeon: Duanne Guessannon, Jennifer, MD;  Location: [facility] ORS;  Service: General;  Laterality: N/A;  . WRIST SURGERY Left      MEDICATIONS:  Prior to Admission medications   Medication Sig Start Date End Date Taking? Authorizing Provider  acetaminophen (TYLENOL) 500 MG tablet Take 500 mg by mouth every  8 (eight) hours as needed for headache.   Yes [provider]  clonazePAM (KLONOPIN) 1 MG tablet TAKE 1 TABLET BY MOUTH TWICE A DAY AS NEEDED ANXIETY 10/04/19  Yes Malfi, Jodelle GrossNicole M, FNP  fluticasone (FLONASE) 50 MCG/ACT nasal spray Place 1 spray into both nostrils daily as needed. 10/04/19  Yes [provider]  hydrocortisone cream 1 % Apply 1 application topically 2 (two) times daily as needed for itching.    Yes [provider]  linaclotide Karlene Einstein(LINZESS) 145 MCG CAPS capsule Take 1 capsule (145 mcg total) by mouth daily before breakfast. 07/05/19 01/01/20 Yes Wyline MoodAnna, Kiran, MD  lisinopril (ZESTRIL) 10 MG tablet Take 1 tablet (10 mg total) by mouth daily. 09/13/19  Yes Malfi, Jodelle GrossNicole M, FNP  omeprazole (PRILOSEC) 20 MG capsule Take 1 capsule (20 mg total) by mouth daily. In evening 06/29/19  Yes Malfi, Jodelle GrossNicole M, FNP  omeprazole (PRILOSEC) 40 MG capsule Take 1 capsule (40 mg total) by mouth daily. In morning 06/29/19  Yes Malfi, Jodelle GrossNicole M, FNP  PARoxetine (PAXIL) 40 MG tablet TAKE 1 TABLET BY MOUTH DAILY 10/26/19  Yes Karamalegos, Netta NeatAlexander J, DO  polyethylene glycol (MIRALAX / GLYCOLAX) 17 g packet Take 17 g by mouth daily. 10/18/19  Yes Darlin PriestlyLai, Tina, MD  polyethylene glycol powder (GLYCOLAX/MIRALAX) 17 GM/SCOOP powder Take 17 g by mouth 2 (two) times daily as needed. 10/25/19  Yes Malfi, Jodelle GrossNicole M, FNP  sodium chloride (OCEAN) 0.65 % SOLN nasal spray Place 1 spray into both nostrils as needed for congestion.   Yes [provider]     ALLERGIES:  Allergies  Allergen  Reactions  . Erythromycin Shortness Of Breath and Rash  . Duloxetine Palpitations     SOCIAL HISTORY:  Social History   Socioeconomic History  . Marital status: Married    Spouse name: Not on file  . Number of children: Not on file  . Years of education: Not on file  . Highest education level: Not on file  Occupational History  . Not on file  Tobacco Use  . Smoking status: Former Smoker    Quit date:  12/27/1976    Years since quitting: 42.8  . Smokeless tobacco: Never Used  Vaping Use  . Vaping Use: Never used  Substance and Sexual Activity  . Alcohol use: No  . Drug use: No  . Sexual activity: Never    Birth control/protection: None  Other Topics Concern  . Not on file  Social History Narrative  . Not on file   Social Determinants of Health   Financial Resource Strain: Low Risk   . Difficulty of Paying Living Expenses: Not hard at all  Food Insecurity: No Food Insecurity  . Worried About Programme researcher, broadcasting/film/video in the Last Year: Never true  . Ran Out of Food in the Last Year: Never true  Transportation Needs: No Transportation Needs  . Lack of Transportation (Medical): No  . Lack of Transportation (Non-Medical): No  Physical Activity: Insufficiently Active  . Days of Exercise per Week: 3 days  . Minutes of Exercise per Session: 30 min  Stress: Stress Concern Present  . Feeling of Stress : To some extent  Social Connections: Moderately Isolated  . Frequency of Communication with Friends and Family: More than three times a week  . Frequency of Social Gatherings with Friends and Family: More than three times a week  . Attends Religious Services: Never  . Active Member of Clubs or Organizations: No  . Attends Banker Meetings: Never  . Marital Status: Married  Catering manager Violence: Not At Risk  . Fear of Current or Ex-Partner: No  . Emotionally Abused: No  . Physically Abused: No  . Sexually Abused: No     FAMILY HISTORY:  Family History  Problem Relation Age of Onset  . Mental illness Mother   . Ulcers Father   . Stroke Neg Hx   . Heart attack Neg Hx   . Cancer Neg Hx       REVIEW OF SYSTEMS:  As per HPI otherwise all other systems on review of systems negative.   VITAL SIGNS:  Temp:  [97.6 F (36.4 C)-98 F (36.7 C)] 98 F (36.7 C) (08/14 0354) Pulse Rate:  [85-93] 87 (08/14 0354) Resp:  [16-30] 20 (08/14 0354) BP: (114-140)/(80-84)  133/84 (08/14 0354) SpO2:  [89 %-97 %] 94 % (08/14 0354) Weight:  [74.6 kg-76 kg] 74.6 kg (08/14 0033)     Height: 5\' 6"  (167.6 cm) Weight: 74.6 kg BMI (Calculated): 26.56   INTAKE/OUTPUT:  08/13 0701 - 08/14 0700 In: 1037.1 [I.V.:37.1; IV Piggyback:1000] Out: 1400 [Urine:100; Emesis/NG output:350]  PHYSICAL EXAM:  Physical Exam Blood pressure 133/84, pulse 87, temperature 98 F (36.7 C), temperature source Oral, resp. rate 20, height 5\' 6"  (1.676 m), weight 74.6 kg, SpO2 94 %. Last Weight  Most recent update: 10/27/2019 12:37 AM   Weight  74.6 kg (164 lb 7.4 oz)            CONSTITUTIONAL: Well developed, and nourished, appropriately responsive and aware without distress.   EYES: Sclera non-icteric.   EARS,  NOSE, MOUTH AND THROAT: Nasogastric tube in place. NECK: Trachea is midline, and there is no jugular venous distension.  LYMPH NODES:  Lymph nodes in the neck are not appreciable. RESPIRATORY:  Lungs are clear, and breath sounds are equal bilaterally. Normal respiratory effort without pathologic use of accessory muscles. CARDIOVASCULAR: Heart is regular in rate and rhythm. GI: The abdomen is soft, nontender, and nondistended. There were no palpable masses.  I also cannot appreciate her umbilical fascial defect well either she has no tenderness in the periumbilical region.  I did not appreciate hepatosplenomegaly. There were normal bowel sounds.   MUSCULOSKELETAL:  Symmetrical muscle tone appreciated in all four extremities.    SKIN: Skin turgor is normal. No pathologic skin lesions appreciated.  NEUROLOGIC:  Motor and sensation appear grossly normal.  Cranial nerves are grossly without defect. PSYCH:  Alert and oriented to person, place and time. Affect is appropriate for situation.  Data Reviewed I have personally reviewed what is currently available of the patient's imaging, recent labs and medical records.    Labs:  CBC Latest Ref Rng & Units 10/27/2019 10/26/2019 10/25/2019   WBC 4.0 - 10.5 K/uL 20.6(H) 22.2(H) 6.9  Hemoglobin 12.0 - 15.0 g/dL 09.9 16.4(H) 13.8  Hematocrit 36 - 46 % 42.2 48.0(H) 41.9  Platelets 150 - 400 K/uL 319 442(H) 291   CMP Latest Ref Rng & Units 10/27/2019 10/26/2019 10/25/2019  Glucose 70 - 99 mg/dL 833(A) 250(N) 96  BUN 8 - 23 mg/dL 7(L) 7(L) 4(L)  Creatinine 0.44 - 1.00 mg/dL 3.97(Q) 7.34(L) 9.37  Sodium 135 - 145 mmol/L 140 136 143  Potassium 3.5 - 5.1 mmol/L 3.0(L) 3.1(L) 3.5  Chloride 98 - 111 mmol/L 103 97(L) 103  CO2 22 - 32 mmol/L 25 24 31   Calcium 8.9 - 10.3 mg/dL ) 9.0(W 9.3  Total Protein 6.5 - 8.1 g/dL 6.8 40.9) 5.9(L)  Total Bilirubin 0.3 - 1.2 mg/dL 0.9 0.9 0.6  Alkaline Phos 38 - 126 U/L 128(H) 144(H) -  AST 15 - 41 U/L 35 31 20  ALT 0 - 44 U/L 29 27 23      Imaging studies:   Last 24 hrs: CT ABDOMEN PELVIS W CONTRAST  Result Date: 10/26/2019 CLINICAL DATA:  Abdominal distension EXAM: CT ABDOMEN AND PELVIS WITH CONTRAST TECHNIQUE: Multidetector CT imaging of the abdomen and pelvis was performed using the standard protocol following bolus administration of intravenous contrast. CONTRAST:  OMNIPAQUE IOHEXOL 300 MG/ML  SOLN COMPARISON:  10/12/2019 FINDINGS: Lower chest: Large hiatal hernia in the right lower chest. This includes much of the stomach and a portion of the transverse colon. Right base atelectasis. Hepatobiliary: Small hypodensity in the right hepatic lobe, stable since prior study, likely cysts. Prior cholecystectomy. Pancreas: No focal abnormality or ductal dilatation. Spleen: No focal abnormality.  Normal size. Adrenals/Urinary Tract: No adrenal abnormality. No focal renal abnormality. No stones or hydronephrosis. Urinary bladder is unremarkable. Stomach/Bowel: Dilated fluid-filled small bowel loops are seen in the abdomen and pelvis bleeding to a small umbilical hernia containing a small bowel loop compatible with small bowel obstruction. Distal to the hernia, small bowel is decompressed. Appendix and  colon are unremarkable. Vascular/Lymphatic: No evidence of aneurysm or adenopathy. Reproductive: Uterus and adnexa unremarkable.  No mass. Other: No free fluid or free air. Musculoskeletal: No acute bony abnormality. IMPRESSION: Small bowel obstruction due to umbilical hernia containing a small bowel loop. This is similar to prior study. Large hiatal hernia containing much of the stomach and a portion of  the mid transverse colon. Electronically Signed   By: Charlett Nose M.D.   On: 10/26/2019 21:15     Assessment/Plan:  69 y.o. female with history well-known to our service, now with recurrent small bowel obstruction secondary to periumbilical recurrent hernia, complicated by pertinent comorbidities including those listed below.  Patient Active Problem List   Diagnosis Date Noted  . Intestinal obstruction due to recurrent umbilical hernia 10/27/2019  . SBO (small bowel obstruction) (HCC) 10/26/2019  . Recurrent ventral hernia 10/26/2019  . AKI (acute kidney injury) (HCC) 10/26/2019  . Hospital discharge follow-up 10/25/2019  . Hypokalemia 10/25/2019  . Hypertension 08/30/2019  . Leg swelling 05/08/2019  . Acute respiratory failure with hypoxia (HCC) 05/08/2019  . Paraesophageal hernia 05/08/2019  . Constipation 04/17/2019  . S/P umbilical hernia repair, follow-up exam 06/21/2018  . S/P laparoscopic cholecystectomy 05/31/2018  . Gallstone pancreatitis   . Umbilical hernia   . Dyspnea 03/11/2018  . Bipolar 1 disorder (HCC) 08/23/2017  . GERD (gastroesophageal reflux disease) 12/27/2016  . Arthritis 12/27/2016  . Anxiety 12/27/2016  . Depression with anxiety 12/27/2016  . Seasonal allergic rhinitis 12/27/2016    -We will continue NG tube for another 24 hours, it appears her obstruction may already be resolved with spontaneous reduction.  -Continue IV fluid resuscitation and follow-up electrolytes/labs.  -It would be ideal to intervene surgically during this hospitalization, however I am  unclear in terms of the preoperative clearances/work-up that remain necessary prior to her definitive hiatal hernia repair.  We may consider an upper GI series and potential follow-through small bowel evaluation if felt necessary.  - DVT prophylaxis We will continue to follow through with you and will discuss/share this patient with Dr. Lady Gary on Monday. Thank you for the opportunity to participate in this patient's care.   -- Campbell Lerner, M.D., FACS 10/27/2019, 10:13 AM

## 2019-10-27 NOTE — Progress Notes (Signed)
PROGRESS NOTE    Katrina Sutton  TRZ:[id number] DOB: [birth year] DOA: 10/26/2019 PCP: Verl Bangs, FNP   Chief complaint.  Abdominal pain.  Brief Narrative: Katrina Sutton is a 69 y.o. female with medical history significant for large paraesophageal hernia, ventral hernia status post repair 2 years prior with recurrent admissions for partial small bowel obstruction, treated conservatively, most recently from 7/29 to 8/5 as well as history of HTN, depression and anxiety, who presents with a 24-hour history of recurrent severe abdominal pain associated with bilious vomiting with inability to hold anything down. Terminal CT scan showed a small bowel obstruction.  She is placed on IV fluids, n.p.o.  General surgery consult obtained.  8/14.  Patient had multiple loose stools today.  Abdominal pain has resolved.  Assessment & Plan:   Principal Problem:   Intestinal obstruction due to recurrent umbilical hernia Active Problems:   Depression with anxiety   Paraesophageal hernia   Hypertension   SBO (small bowel obstruction) (HCC)   AKI (acute kidney injury) ([place])  #1.  Small bowel obstruction secondary to umbilical hernia. Condition seem to be improving today.  Patient has multiple loose stools today.  Abdominal pain and distention is much better. I will repeat KUB. Patient will be seen by general surgery today, anticipating starting on diet.  #2.  Hypokalemia. IV supplement, also added potassium into IV fluids per recheck potassium tomorrow.  3.  Acute kidney injury. Renal function much improved.  4.  Essential hypertension. Continue as needed medicines.  5.  Leukocytosis. Appear to be secondary to reactive to small bowel obstruction. No evidence of bacterial infection.   DVT prophylaxis: SCDS Code Status: Full Family Communication: None Disposition Plan:  . Patient came from: Home            . Anticipated d/c place: Home . Barriers to d/c OR conditions which need to be met  to effect a safe d/c:  We will discharge home tomorrow. Consultants:   General surgery.  Procedures: None Antimicrobials:None  Subjective: Patient feels better today.  She had a multiple loose stools today.  No additional abdominal pain or nausea vomiting.  Abdominal distention much improved. Denies any short of breath or cough.   Objective: Vitals:   10/26/19 2230 10/27/19 0000 10/27/19 0033 10/27/19 0354  BP: 140/84 131/83 133/82 133/84  Pulse: 85 86 88 87  Resp: '17 16 20 20  ' Temp:   97.7 F (36.5 C) 98 F (36.7 C)  TempSrc:   Oral Oral  SpO2: 92% 95% 97% 94%  Weight:   74.6 kg   Height:   '5\' 6"'  (1.676 m)     Intake/Output Summary (Last 24 hours) at 10/27/2019 1042 Last data filed at 10/27/2019 0438 Gross per 24 hour  Intake 1037.13 ml  Output 1400 ml  Net -362.87 ml   Filed Weights   10/26/19 2021 10/27/19 0033  Weight: 76 kg 74.6 kg    Examination:  General exam: Appears calm and comfortable  Respiratory system: Clear to auscultation. Respiratory effort normal. Cardiovascular system: S1 & S2 heard, RRR. No JVD, murmurs, rubs, gallops or clicks. No pedal edema. Gastrointestinal system: Abdomen is nondistended, soft and nontender. No organomegaly or masses felt. Normal bowel sounds heard. Central nervous system: Alert and oriented. No focal neurological deficits. Extremities: Symmetric Skin: No rashes, lesions or ulcers Psychiatry: Judgement and insight appear normal. Mood & affect appropriate.     Data Reviewed: I have personally reviewed following labs and imaging studies  CBC: Recent Labs  Lab 10/25/19 1003 10/26/19 2037 10/27/19 0131  WBC 6.9 22.2* 20.6*  NEUTROABS 4,865 18.9*  --   HGB 13.8 16.4* 14.7  HCT 41.9 48.0* 42.2  MCV 93.3 90.4 88.5  PLT 291 442* 161   Basic Metabolic Panel: Recent Labs  Lab 10/25/19 1003 10/26/19 2037 10/27/19 0131  NA 143 136 140  K 3.5 3.1* 3.0*  CL 103 97* 103  CO2 '31 24 25  ' GLUCOSE 96 200* 135*  BUN  4* 7* 7*  CREATININE 0.93 1.43* 1.16*  CALCIUM 9.3 10.1 8.8*   GFR: Estimated Creatinine Clearance: 45.9 mL/min (A) (by C-G formula based on SCr of 1.16 mg/dL (H)). Liver Function Tests: Recent Labs  Lab 10/25/19 1003 10/26/19 2037 10/27/19 0131  AST 20 31 35  ALT '23 27 29  ' ALKPHOS  --  144* 128*  BILITOT 0.6 0.9 0.9  PROT 5.9* 8.2* 6.8  ALBUMIN  --  4.7 3.8   Recent Labs  Lab 10/26/19 2037  LIPASE 74*   No results for input(s): AMMONIA in the last 168 hours. Coagulation Profile: No results for input(s): INR, PROTIME in the last 168 hours. Cardiac Enzymes: No results for input(s): CKTOTAL, CKMB, CKMBINDEX, TROPONINI in the last 168 hours. BNP (last 3 results) No results for input(s): PROBNP in the last 8760 hours. HbA1C: No results for input(s): HGBA1C in the last 72 hours. CBG: No results for input(s): GLUCAP in the last 168 hours. Lipid Profile: Recent Labs    10/25/19 1003  CHOL 167  HDL 45*  LDLCALC 99  TRIG 132  CHOLHDL 3.7   Thyroid Function Tests: Recent Labs    10/25/19 1003  TSH 1.44  T4TOTAL 11.7   Anemia Panel: No results for input(s): VITAMINB12, FOLATE, FERRITIN, TIBC, IRON, RETICCTPCT in the last 72 hours. Sepsis Labs: Recent Labs  Lab 10/26/19 2037 10/27/19 0131  LATICACIDVEN 1.7 0.9    Recent Results (from the past 240 hour(s))  SARS Coronavirus 2 by RT PCR (hospital order, performed in [facility] hospital lab) Nasopharyngeal Nasopharyngeal Swab     Status: None   Collection Time: 10/26/19 10:53 PM   Specimen: Nasopharyngeal Swab  Result Value Ref Range Status   SARS Coronavirus 2 NEGATIVE NEGATIVE Final    Comment: (NOTE) SARS-CoV-2 target nucleic acids are NOT DETECTED.  The SARS-CoV-2 RNA is generally detectable in upper and lower respiratory specimens during the acute phase of infection. The lowest concentration of SARS-CoV-2 viral copies this assay can detect is 250 copies / mL. A negative result does not preclude  SARS-CoV-2 infection and should not be used as the sole basis for treatment or other patient management decisions.  A negative result may occur with improper specimen collection / handling, submission of specimen other than nasopharyngeal swab, presence of viral mutation(s) within the areas targeted by this assay, and inadequate number of viral copies (<250 copies / mL). A negative result must be combined with clinical observations, patient history, and epidemiological information.  Fact Sheet for Patients:   StrictlyIdeas.no  Fact Sheet for Healthcare Providers: BankingDealers.co.za  This test is not yet approved or  cleared by the [place] FDA and has been authorized for detection and/or diagnosis of SARS-CoV-2 by FDA under an Emergency Use Authorization (EUA).  This EUA will remain in effect (meaning this test can be used) for the duration of the COVID-19 declaration under Section 564(b)(1) of the Act, 21 U.S.C. section 360bbb-3(b)(1), unless the authorization is terminated or revoked sooner.  Performed at [facility], [address], [place], [place] [address]          Radiology Studies: CT ABDOMEN PELVIS W CONTRAST  Result Date: 10/26/2019 CLINICAL DATA:  Abdominal distension EXAM: CT ABDOMEN AND PELVIS WITH CONTRAST TECHNIQUE: Multidetector CT imaging of the abdomen and pelvis was performed using the standard protocol following bolus administration of intravenous contrast. CONTRAST:  121m OMNIPAQUE IOHEXOL 300 MG/ML  SOLN COMPARISON:  10/12/2019 FINDINGS: Lower chest: Large hiatal hernia in the right lower chest. This includes much of the stomach and a portion of the transverse colon. Right base atelectasis. Hepatobiliary: Small hypodensity in the right hepatic lobe, stable since prior study, likely cysts. Prior cholecystectomy. Pancreas: No focal abnormality or ductal dilatation. Spleen: No focal abnormality.   Normal size. Adrenals/Urinary Tract: No adrenal abnormality. No focal renal abnormality. No stones or hydronephrosis. Urinary bladder is unremarkable. Stomach/Bowel: Dilated fluid-filled small bowel loops are seen in the abdomen and pelvis bleeding to a small umbilical hernia containing a small bowel loop compatible with small bowel obstruction. Distal to the hernia, small bowel is decompressed. Appendix and colon are unremarkable. Vascular/Lymphatic: No evidence of aneurysm or adenopathy. Reproductive: Uterus and adnexa unremarkable.  No mass. Other: No free fluid or free air. Musculoskeletal: No acute bony abnormality. IMPRESSION: Small bowel obstruction due to umbilical hernia containing a small bowel loop. This is similar to prior study. Large hiatal hernia containing much of the stomach and a portion of the mid transverse colon. Electronically Signed   By: KRolm BaptiseM.D.   On: 10/26/2019 21:15        Scheduled Meds: Continuous Infusions: . dextrose 5 % and 0.45 % NaCl with KCl 20 mEq/L 75 mL/hr at 10/27/19 0944  . potassium chloride 10 mEq (10/27/19 0946)     LOS: 1 day    Time spent: 28 minutes    DSharen Hones MD [facility]   To contact the attending provider between 7A-7P or the covering provider during after hours 7P-7A, please log into the web site [email or web address] and access using universal Katrina Sutton password for that web site. If you do not have the password, please call the hospital operator.  10/27/2019, 10:42 AM

## 2019-10-28 LAB — CBC WITH DIFFERENTIAL/PLATELET
Abs Immature Granulocytes: 0.02 10*3/uL (ref 0.00–0.07)
Basophils Absolute: 0 10*3/uL (ref 0.0–0.1)
Basophils Relative: 0 %
Eosinophils Absolute: 0.1 10*3/uL (ref 0.0–0.5)
Eosinophils Relative: 1 %
HCT: 37.8 % (ref 36.0–46.0)
Hemoglobin: 12.2 g/dL (ref 12.0–15.0)
Immature Granulocytes: 0 %
Lymphocytes Relative: 11 %
Lymphs Abs: 1.1 10*3/uL (ref 0.7–4.0)
MCH: 30.1 pg (ref 26.0–34.0)
MCHC: 32.3 g/dL (ref 30.0–36.0)
MCV: 93.3 fL (ref 80.0–100.0)
Monocytes Absolute: 0.8 10*3/uL (ref 0.1–1.0)
Monocytes Relative: 8 %
Neutro Abs: 8.2 10*3/uL — ABNORMAL HIGH (ref 1.7–7.7)
Neutrophils Relative %: 80 %
Platelets: 263 10*3/uL (ref 150–400)
RBC: 4.05 MIL/uL (ref 3.87–5.11)
RDW: 13.5 % (ref 11.5–15.5)
WBC: 10.2 10*3/uL (ref 4.0–10.5)
nRBC: 0 % (ref 0.0–0.2)

## 2019-10-28 LAB — BASIC METABOLIC PANEL
Anion gap: 10 (ref 5–15)
BUN: 7 mg/dL — ABNORMAL LOW (ref 8–23)
CO2: 28 mmol/L (ref 22–32)
Calcium: 8.6 mg/dL — ABNORMAL LOW (ref 8.9–10.3)
Chloride: 108 mmol/L (ref 98–111)
Creatinine, Ser: 0.94 mg/dL (ref 0.44–1.00)
GFR calc Af Amer: >60 mL/min (ref >60)
GFR calc non Af Amer: >60 mL/min (ref >60)
Glucose, Bld: 113 mg/dL — ABNORMAL HIGH (ref 70–99)
Potassium: 3.3 mmol/L — ABNORMAL LOW (ref 3.5–5.1)
Sodium: 146 mmol/L — ABNORMAL HIGH (ref 135–145)

## 2019-10-28 LAB — MAGNESIUM: Magnesium: 1.7 mg/dL (ref 1.7–2.4)

## 2019-10-28 LAB — PHOSPHORUS: Phosphorus: 2.6 mg/dL (ref 2.5–4.6)

## 2019-10-28 MED ORDER — POTASSIUM CHLORIDE 10 MEQ/100ML IV SOLN
10.0000 meq | INTRAVENOUS | Status: AC
  Administered 2019-10-28 (×2): 10 meq via INTRAVENOUS
  Filled 2019-10-28 (×2): qty 100

## 2019-10-28 MED ORDER — AMLODIPINE BESYLATE 5 MG PO TABS
5.0000 mg | ORAL_TABLET | Freq: Every day | ORAL | Status: DC
  Administered 2019-10-28 – 2019-11-01 (×4): 5 mg via ORAL
  Filled 2019-10-28 (×4): qty 1

## 2019-10-28 NOTE — Progress Notes (Signed)
[facility] SUR[facility] PROGRESS NOTE (cpt [phone])  Hospital Day(s): 2.   Interval History: Patient seen and examined, no acute events or new complaints overnight. Patient reports no additional bowel movement since those of yesterday, still passing flatus, denies nausea or vomiting or crampy abdominal pain.  Review of Systems:  Constitutional: denies fever, chills  HEENT: denies cough or congestion  Respiratory: denies any shortness of breath  Cardiovascular: denies chest pain or palpitations  Gastrointestinal: denies abdominal pain, N/V,  Genitourinary: denies burning with urination or urinary frequency Musculoskeletal: denies pain, decreased motor or sensation Integumentary: denies any other rashes or skin discolorations Neurological: denies HA or vision/hearing changes   Vital signs in last 24 hours: [min-max] current  Temp:  [97.6 F (36.4 C)-98.7 F (37.1 C)] 98.7 F (37.1 C) (08/15 0435) Pulse Rate:  [73-88] 73 (08/15 0435) Resp:  [16-20] 16 (08/15 0435) BP: (137-150)/(67-78) 150/78 (08/15 0435) SpO2:  [95 %] 95 % (08/15 0435)     Height: 5\' 6"  (167.6 cm) Weight: 74.6 kg BMI (Calculated): 26.56   Intake/Output last 2 shifts:  08/14 0701 - 08/15 0700 In: 695.9 [I.V.:545; IV Piggyback:150.9] Out: 1200 [Urine:1000; Emesis/NG output:200]   Physical Exam:  Constitutional: alert, cooperative and no distress   Respiratory: breathing non-labored at rest  Cardiovascular: regular rate and sinus rhythm  Gastrointestinal: soft, non-tender, and non-distended, no periumbilical tenderness or mass.  Normal active bowel sounds. Musculoskeletal: UE and LE FROM, no edema or wounds, NT    Labs:  CBC Latest Ref Rng & Units 10/28/2019 10/27/2019 10/26/2019  WBC 4.0 - 10.5 K/uL 10.2 20.6(H) 22.2(H)  Hemoglobin 12.0 - 15.0 g/dL 10/28/2019 21.2 16.4(H)  Hematocrit 36 - 46 % 37.8 42.2 48.0(H)  Platelets 150 - 400 K/uL 263 319 442(H)   CMP Latest Ref Rng & Units 10/28/2019 10/27/2019  10/26/2019  Glucose 70 - 99 mg/dL 10/28/2019) 250(I) 370(W)  BUN 8 - 23 mg/dL 7(L) 7(L) 7(L)  Creatinine 0.44 - 1.00 mg/dL 888(B 1.69) 4.50(T)  Sodium 135 - 145 mmol/L 146(H) 140 136  Potassium 3.5 - 5.1 mmol/L 3.3(L) 3.0(L) 3.1(L)  Chloride 98 - 111 mmol/L 108 103 97(L)  CO2 22 - 32 mmol/L 28 25 24   Calcium 8.9 - 10.3 mg/dL 8.88(K) ) 8.0(K  Total Protein 6.5 - 8.1 g/dL - 6.8 3.4(J)  Total Bilirubin 0.3 - 1.2 mg/dL - 0.9 0.9  Alkaline Phos 38 - 126 U/L - 128(H) 144(H)  AST 15 - 41 U/L - 35 31  ALT 0 - 44 U/L - 29 27     Imaging studies: No new pertinent imaging studies   Assessment/Plan:  69 y.o. female with resolving small bowel obstruction   s/p spontaneous reduction of periumbilical recurrent hernia, with known marked paraesophageal hernia, complicated by pertinent comorbidities including those noted below. Patient Active Problem List   Diagnosis Date Noted   Intestinal obstruction due to recurrent umbilical hernia 10/27/2019   SBO (small bowel obstruction) (HCC) 10/26/2019   Recurrent ventral hernia 10/26/2019   AKI (acute kidney injury) (HCC) 10/26/2019   Hospital discharge follow-up 10/25/2019   Hypokalemia 10/25/2019   Hypertension 08/30/2019   Leg swelling 05/08/2019   Acute respiratory failure with hypoxia (HCC) 05/08/2019   Paraesophageal hernia 05/08/2019   Constipation 04/17/2019   S/P umbilical hernia repair, follow-up exam 06/21/2018   S/P laparoscopic cholecystectomy 05/31/2018   Gallstone pancreatitis    Umbilical hernia    Dyspnea 03/11/2018   Bipolar 1 disorder (HCC) 08/23/2017   GERD (gastroesophageal reflux disease)  12/27/2016   Arthritis 12/27/2016   Anxiety 12/27/2016   Depression with anxiety 12/27/2016   Seasonal allergic rhinitis 12/27/2016     -We will discontinue her NG tube today, and proceed with clear liquid diet.  - -It would be "ideal" (in the pt's perspective) to intervene surgically during this hospitalization,  however I am unclear in terms of the preoperative clearances/work-up that remain necessary prior to her definitive hiatal hernia repair.    She seems to think she may be able to defer recurring obstruction if she stays on liquids.  We may consider an upper GI series (regarding her paraesophageal hernia) and potential follow-through small bowel evaluation if felt necessary.  - We will continue to follow through with you and will discuss/share this patient with Dr. Lady Gary on Monday. Thank you for the opportunity to participate in this patient's care.       All of the above findings and recommendations were discussed with the patien, and all of patient's  questions were answered to her expressed satisfaction.   -- Campbell Lerner M.D., [facility] 10/28/2019 11:29 AM

## 2019-10-28 NOTE — Plan of Care (Signed)
Continuing with plan of care. 

## 2019-10-28 NOTE — Progress Notes (Signed)
PROGRESS NOTE    Katrina Sutton  CBS:[id number] DOB: Apr 28, 1950 DOA: 10/26/2019 PCP: Katrina Bangs, FNP   Chief complaint.  Abdominal pain. Brief Narrative:   Katrina T Rileyis a 69 y.o.femalewith medical history significant forlarge paraesophageal hernia, ventral hernia status post repair 2 years prior with recurrent admissions for partial small bowel obstruction, treated conservatively, most recently from 7/29 to 8/5 as well as history of HTN, depression and anxiety, who presents with a 24-hour history of recurrent severe abdominal pain associated with bilious vomiting with inability to hold anything down. Terminal CT scan showed a small bowel obstruction.  She is placed on IV fluids, n.p.o.  General surgery consult obtained.  8/14.  Patient had multiple loose stools today.  Abdominal pain has resolved.   Assessment & Plan:   Principal Problem:   Intestinal obstruction due to recurrent umbilical hernia Active Problems:   Depression with anxiety   Paraesophageal hernia   Hypertension   SBO (small bowel obstruction) (HCC)   AKI (acute kidney injury) ([place])  #1.  Small bowel obstruction secondary to umbilical hernia. Condition improving.  Abdominal pain and distention essentially resolved.  Repeated chest x-ray yesterday still have some evidence of small bowel traction. Patient is followed by general surgery,  #2.  Hypokalemia. Supplement.  Continue IV fluids with added potassium.  3.  Acute kidney injury. Renal function improved.  4 essential hypertension. Blood pressure running high, start Norvasc 5 mg p.o. daily.   DVT prophylaxis: SCDS Code Status: Full Family Communication: None Disposition Plan:   Patient came from: Home                                                                                                                           Anticipated d/c place: Home  Barriers to d/c OR conditions which need to be met to effect a safe d/c:  We will discharge  home tomorrow. Consultants:   General surgery.  Procedures: None Antimicrobials:None   Subjective: Patient doing well.  No longer has any abdominal pain.  No nausea vomiting.  No additional bowel movement since yesterday.  Denies any short of breath or cough  Objective: Vitals:   10/27/19 0354 10/27/19 1131 10/27/19 1950 10/28/19 0435  BP: 133/84 137/67 (!) 144/71 (!) 150/78  Pulse: 87 83 88 73  Resp: '20 19 20 16  ' Temp: 98 F (36.7 C) 97.6 F (36.4 C) 98.7 F (37.1 C) 98.7 F (37.1 C)  TempSrc: Oral Oral Oral Oral  SpO2: 94% 95% 95% 95%  Weight:      Height:        Intake/Output Summary (Last 24 hours) at 10/28/2019 0947 Last data filed at 10/28/2019 0659 Gross per 24 hour  Intake 695.9 ml  Output 1200 ml  Net -504.1 ml   Filed Weights   10/26/19 2021 10/27/19 0033  Weight: 76 kg 74.6 kg    Examination:  General exam: Appears calm and comfortable  Respiratory system: Clear to auscultation.  Respiratory effort normal. Cardiovascular system: S1 & S2 heard, RRR. No JVD, murmurs, rubs, gallops or clicks. No pedal edema. Gastrointestinal system: Abdomen is nondistended, soft and nontender. No organomegaly or masses felt. Normal bowel sounds heard. Central nervous system: Alert and oriented. No focal neurological deficits. Extremities: Symmetric 5 x 5 power. Skin: No rashes, lesions or ulcers Psychiatry: Judgement and insight appear normal. Mood & affect appropriate.     Data Reviewed: I have personally reviewed following labs and imaging studies  CBC: Recent Labs  Lab 10/25/19 1003 10/26/19 2037 10/27/19 0131 10/28/19 0638  WBC 6.9 22.2* 20.6* 10.2  NEUTROABS 4,865 18.9*  --  8.2*  HGB 13.8 16.4* 14.7 12.2  HCT 41.9 48.0* 42.2 37.8  MCV 93.3 90.4 88.5 93.3  PLT 291 442* 319 768   Basic Metabolic Panel: Recent Labs  Lab 10/25/19 1003 10/26/19 2037 10/27/19 0131 10/28/19 0638  NA 143 136 140 146*  K 3.5 3.1* 3.0* 3.3*  CL 103 97* 103 108  CO2 '31  24 25 28  ' GLUCOSE 96 200* 135* 113*  BUN 4* 7* 7* 7*  CREATININE 0.93 1.43* 1.16* 0.94  CALCIUM 9.3 10.1 8.8* 8.6*  MG  --   --   --  1.7  PHOS  --   --   --  2.6   GFR: Estimated Creatinine Clearance: 56.7 mL/min (by C-G formula based on SCr of 0.94 mg/dL). Liver Function Tests: Recent Labs  Lab 10/25/19 1003 10/26/19 2037 10/27/19 0131  AST 20 31 35  ALT '23 27 29  ' ALKPHOS  --  144* 128*  BILITOT 0.6 0.9 0.9  PROT 5.9* 8.2* 6.8  ALBUMIN  --  4.7 3.8   Recent Labs  Lab 10/26/19 2037  LIPASE 74*   No results for input(s): AMMONIA in the last 168 hours. Coagulation Profile: No results for input(s): INR, PROTIME in the last 168 hours. Cardiac Enzymes: No results for input(s): CKTOTAL, CKMB, CKMBINDEX, TROPONINI in the last 168 hours. BNP (last 3 results) No results for input(s): PROBNP in the last 8760 hours. HbA1C: No results for input(s): HGBA1C in the last 72 hours. CBG: No results for input(s): GLUCAP in the last 168 hours. Lipid Profile: Recent Labs    10/25/19 1003  CHOL 167  HDL 45*  LDLCALC 99  TRIG 132  CHOLHDL 3.7   Thyroid Function Tests: Recent Labs    10/25/19 1003  TSH 1.44  T4TOTAL 11.7   Anemia Panel: No results for input(s): VITAMINB12, FOLATE, FERRITIN, TIBC, IRON, RETICCTPCT in the last 72 hours. Sepsis Labs: Recent Labs  Lab 10/26/19 2037 10/27/19 0131  LATICACIDVEN 1.7 0.9    Recent Results (from the past 240 hour(s))  SARS Coronavirus 2 by RT PCR (hospital order, performed in [facility] hospital lab) Nasopharyngeal Nasopharyngeal Swab     Status: None   Collection Time: 10/26/19 10:53 PM   Specimen: Nasopharyngeal Swab  Result Value Ref Range Status   SARS Coronavirus 2 NEGATIVE NEGATIVE Final    Comment: (NOTE) SARS-CoV-2 target nucleic acids are NOT DETECTED.  The SARS-CoV-2 RNA is generally detectable in upper and lower respiratory specimens during the acute phase of infection. The lowest concentration of SARS-CoV-2  viral copies this assay can detect is 250 copies / mL. A negative result does not preclude SARS-CoV-2 infection and should not be used as the sole basis for treatment or other patient management decisions.  A negative result may occur with improper specimen collection / handling, submission of specimen other  than nasopharyngeal swab, presence of viral mutation(s) within the areas targeted by this assay, and inadequate number of viral copies (<250 copies / mL). A negative result must be combined with clinical observations, patient history, and epidemiological information.  Fact Sheet for Patients:   StrictlyIdeas.no  Fact Sheet for Healthcare Providers: BankingDealers.co.za  This test is not yet approved or  cleared by the [place] FDA and has been authorized for detection and/or diagnosis of SARS-CoV-2 by FDA under an Emergency Use Authorization (EUA).  This EUA will remain in effect (meaning this test can be used) for the duration of the COVID-19 declaration under Section 564(b)(1) of the Act, 21 U.S.C. section 360bbb-3(b)(1), unless the authorization is terminated or revoked sooner.  Performed at [facility], [address], [place], [place] [address]          Radiology Studies: DG Abd 1 View  Result Date: 10/27/2019 CLINICAL DATA:  Small-bowel obstruction. EXAM: ABDOMEN - 1 VIEW COMPARISON:  CT abdomen pelvis dated 10/26/2019, abdominal radiograph dated 10/14/2019. FINDINGS: Multiple mildly dilated air-filled loops of small bowel are seen in the abdomen. Air-fluid levels and free intraperitoneal air cannot be excluded on the supine exam. An enteric tube overlies the stomach which is located in the right lower thorax. IMPRESSION: 1. Enteric tube overlies the stomach which is located in the right lower thorax. 2. Multiple mildly dilated air-filled loops of small bowel are consistent with small bowel obstruction.  Comparison with prior CT dated 10/26/2019 is difficult due to differences in technique. Electronically Signed   By: Zerita Boers M.D.   On: 10/27/2019 11:37   CT ABDOMEN PELVIS W CONTRAST  Result Date: 10/26/2019 CLINICAL DATA:  Abdominal distension EXAM: CT ABDOMEN AND PELVIS WITH CONTRAST TECHNIQUE: Multidetector CT imaging of the abdomen and pelvis was performed using the standard protocol following bolus administration of intravenous contrast. CONTRAST:  140m OMNIPAQUE IOHEXOL 300 MG/ML  SOLN COMPARISON:  10/12/2019 FINDINGS: Lower chest: Large hiatal hernia in the right lower chest. This includes much of the stomach and a portion of the transverse colon. Right base atelectasis. Hepatobiliary: Small hypodensity in the right hepatic lobe, stable since prior study, likely cysts. Prior cholecystectomy. Pancreas: No focal abnormality or ductal dilatation. Spleen: No focal abnormality.  Normal size. Adrenals/Urinary Tract: No adrenal abnormality. No focal renal abnormality. No stones or hydronephrosis. Urinary bladder is unremarkable. Stomach/Bowel: Dilated fluid-filled small bowel loops are seen in the abdomen and pelvis bleeding to a small umbilical hernia containing a small bowel loop compatible with small bowel obstruction. Distal to the hernia, small bowel is decompressed. Appendix and colon are unremarkable. Vascular/Lymphatic: No evidence of aneurysm or adenopathy. Reproductive: Uterus and adnexa unremarkable.  No mass. Other: No free fluid or free air. Musculoskeletal: No acute bony abnormality. IMPRESSION: Small bowel obstruction due to umbilical hernia containing a small bowel loop. This is similar to prior study. Large hiatal hernia containing much of the stomach and a portion of the mid transverse colon. Electronically Signed   By: KRolm BaptiseM.D.   On: 10/26/2019 21:15        Scheduled Meds: Continuous Infusions: . dextrose 5 % and 0.45 % NaCl with KCl 20 mEq/L 75 mL/hr at 10/28/19 0108    . potassium chloride       LOS: 2 days    Time spent: 25 minutes    DSharen Hones MD [facility]   To contact the attending provider between 7A-7P or the covering provider during after hours 7P-7A, please  log into the web site [email or web address] and access using universal [place] password for that web site. If you do not have the password, please call the hospital operator.  10/28/2019, 9:47 AM

## 2019-10-29 LAB — BASIC METABOLIC PANEL
Anion gap: 11 (ref 5–15)
BUN: 6 mg/dL — ABNORMAL LOW (ref 8–23)
CO2: 25 mmol/L (ref 22–32)
Calcium: 9 mg/dL (ref 8.9–10.3)
Chloride: 107 mmol/L (ref 98–111)
Creatinine, Ser: 0.68 mg/dL (ref 0.44–1.00)
GFR calc Af Amer: >60 mL/min (ref >60)
GFR calc non Af Amer: >60 mL/min (ref >60)
Glucose, Bld: 122 mg/dL — ABNORMAL HIGH (ref 70–99)
Potassium: 4.2 mmol/L (ref 3.5–5.1)
Sodium: 143 mmol/L (ref 135–145)

## 2019-10-29 LAB — CBC WITH DIFFERENTIAL/PLATELET
Abs Immature Granulocytes: 0.04 10*3/uL (ref 0.00–0.07)
Basophils Absolute: 0 10*3/uL (ref 0.0–0.1)
Basophils Relative: 0 %
Eosinophils Absolute: 0.1 10*3/uL (ref 0.0–0.5)
Eosinophils Relative: 1 %
HCT: 41.2 % (ref 36.0–46.0)
Hemoglobin: 13.5 g/dL (ref 12.0–15.0)
Immature Granulocytes: 0 %
Lymphocytes Relative: 11 %
Lymphs Abs: 1.4 10*3/uL (ref 0.7–4.0)
MCH: 30.7 pg (ref 26.0–34.0)
MCHC: 32.8 g/dL (ref 30.0–36.0)
MCV: 93.6 fL (ref 80.0–100.0)
Monocytes Absolute: 0.9 10*3/uL (ref 0.1–1.0)
Monocytes Relative: 7 %
Neutro Abs: 10 10*3/uL — ABNORMAL HIGH (ref 1.7–7.7)
Neutrophils Relative %: 81 %
Platelets: 276 10*3/uL (ref 150–400)
RBC: 4.4 MIL/uL (ref 3.87–5.11)
RDW: 13.2 % (ref 11.5–15.5)
WBC: 12.5 10*3/uL — ABNORMAL HIGH (ref 4.0–10.5)
nRBC: 0 % (ref 0.0–0.2)

## 2019-10-29 LAB — SURGICAL PCR SCREEN
MRSA, PCR: NEGATIVE
Staphylococcus aureus: NEGATIVE

## 2019-10-29 LAB — MAGNESIUM: Magnesium: 1.7 mg/dL (ref 1.7–2.4)

## 2019-10-29 MED ORDER — PANTOPRAZOLE SODIUM 40 MG IV SOLR: 40.0000 mg | Freq: Two times a day (BID) | INTRAVENOUS | Status: DC

## 2019-10-29 MED ORDER — PANTOPRAZOLE SODIUM 40 MG IV SOLR
40.0000 mg | Freq: Two times a day (BID) | INTRAVENOUS | Status: DC | PRN
  Administered 2019-10-29 – 2019-11-01 (×5): 40 mg via INTRAVENOUS
  Filled 2019-10-29 (×5): qty 40

## 2019-10-29 NOTE — Progress Notes (Addendum)
[place] [facility] PROGRESS NOTE (cpt [phone])  Hospital Day(s): 3.    Interval History: Patient seen and examined, no acute events or new complaints overnight. Patient reports she is doing well, denies abdominal pain, fever, chills, nausea, or emesis. She did have a slight increase in her leukocytosis this morning to 12.5K. Renal function is normal with sCr - 0.68. No electrolyte derangement seen. NGT was initially removed yesterday morning but for some reason this was replaced yesterday evening. NGT with 300 ccs out in the last 24 hours although she has been on CLD despite NGT being replaced. She has a BM recorded and passing flatus. No issues with mobilization  Review of Systems:  Constitutional: denies fever, chills  HEENT: denies cough or congestion  Respiratory: denies any shortness of breath  Cardiovascular: denies chest pain or palpitations  Gastrointestinal: denies abdominal pain, N/V, or diarrhea/and bowel function as per interval history Genitourinary: denies burning with urination or urinary frequency Musculoskeletal: denies pain, decreased motor or sensation  Vital signs in last 24 hours: [min-max] current  Temp:  [97.9 F (36.6 C)-98.4 F (36.9 C)] 97.9 F (36.6 C) (08/16 0428) Pulse Rate:  [82-93] 83 (08/16 0428) Resp:  [16-24] 20 (08/16 0428) BP: (149-168)/(75-98) 157/85 (08/16 0543) SpO2:  [92 %-97 %] 97 % (08/16 0428)     Height: 5\' 6"  (167.6 cm) Weight: 74.6 kg BMI (Calculated): 26.56   Intake/Output last 2 shifts:  08/15 0701 - 08/16 0700 In: 1604 [I.V.:1401.2; NG/GT:75; IV Piggyback:127.8] Out: 300 [Emesis/NG output:300]   Physical Exam:  Constitutional: alert, cooperative and no distress  HENT: normocephalic without obvious abnormality, NGT in place despite being removed yesterday morning (output consistent with CLD) Respiratory: breathing non-labored at rest  Cardiovascular: regular rate and sinus rhythm  Gastrointestinal: Soft, non-tender,  and non-distended, no rebound/guarding. I do not appreciate a periumbilical hernia with valsalva maneuvers  Musculoskeletal: no edema or wounds, motor and sensation grossly intact, NT    Labs:  CBC Latest Ref Rng & Units 10/29/2019 10/28/2019 10/27/2019  WBC 4.0 - 10.5 K/uL 12.5(H) 10.2 20.6(H)  Hemoglobin 12.0 - 15.0 g/dL 10/29/2019 07.3 71.0  Hematocrit 36 - 46 % 41.2 37.8 42.2  Platelets 150 - 400 K/uL 276 263 319   CMP Latest Ref Rng & Units 10/29/2019 10/28/2019 10/27/2019  Glucose 70 - 99 mg/dL 10/29/2019) 948(N) 462(V)  BUN 8 - 23 mg/dL 6(L) 7(L) 7(L)  Creatinine 0.44 - 1.00 mg/dL 035(K 0.93 8.18)  Sodium 135 - 145 mmol/L 143 146(H) 140  Potassium 3.5 - 5.1 mmol/L 4.2 3.3(L) 3.0(L)  Chloride 98 - 111 mmol/L 107 108 103  CO2 22 - 32 mmol/L 25 28 25   Calcium 8.9 - 10.3 mg/dL 9.0 2.99(B) )  Total Protein 6.5 - 8.1 g/dL - - 6.8  Total Bilirubin 0.3 - 1.2 mg/dL - - 0.9  Alkaline Phos 38 - 126 U/L - - 128(H)  AST 15 - 41 U/L - - 35  ALT 0 - 44 U/L - - 29    Imaging studies: No new pertinent imaging studies   Assessment/Plan: (ICD-10's: K59.609) 69 y.o. female with resolved small bowel obstruction following spontaneous reduction of paraumbilical recurrent hernia, complicated by known marked paraesophageal hernia.    - Despite NGT being discontinued and removed yesterday morning, this was replaced at some point yesterday evening. Neither the patient nor RN know why. She was continued on CLD and remained on LIS. I removed NGT this morning and she certainly does NOT need this replaced  -  Continue CLD for now; advance to full liquids this evening. We will not advance further.   - Wean IVF resuscitation   - Monitor abdominal examination; on-going bowel function  - Tentatively plan for umbilical hernia repair on Wednesday with Dr Lady Gary  - Mobilization encouraged   - Further management per primary service; we will follow    All of the above findings and recommendations were discussed with the  patient, and the medical team, and all of patient's questions were answered to her expressed satisfaction.  -- Lynden Oxford, PA-C [facility] 10/29/2019, 7:32 AM [phone] M-F: 7am - 4pm  I saw and evaluated the patient.  I agree with the above documentation, exam, and plan, which I have edited where appropriate. Patient is on the OR schedule for Wednesday morning around 8:30 am. Duanne Guess  3:45 PM

## 2019-10-29 NOTE — Progress Notes (Signed)
Mobility Specialist - Progress Note   10/29/19 1152  Mobility  Activity Ambulated in hall  Level of Assistance Standby assist, set-up cues, supervision of patient - no hands on  Assistive Device None (Pt pushed IV pole)  Distance Ambulated (ft) 350 ft  Mobility Response Tolerated well  Mobility performed by Mobility specialist  $Mobility charge 1 Mobility    Pre-mobility: 78 HR, 144/77 BP, 96% SpO2 During mobility: 101 HR, 89% SpO2 Post-mobility: 85 HR, 144/75 BP, 96% SpO2    Pt was lying in bed, utilizing room air, upon arrival. Pt agreed to session. Pt was independent in sit-to-stand, SBA while ambulating. Pt did not use assistive device while ambulating, but did agree to push her IV pole. Pt ambulated approximately 330' before O2 desat to 89%. Pt denied any SOB or dizziness. Overall, pt tolerated session well. Pt was left in bed with phone/call bell in reach. Nurse was notified of performance.   Filiberto Pinks Mobility Specialist 10/29/19, 12:00 PM

## 2019-10-29 NOTE — Progress Notes (Signed)
PROGRESS NOTE    Katrina Sutton  OMV:[id number] DOB: June 29, 1950 DOA: 10/26/2019 PCP: Verl Bangs, FNP   Chief complaint.  Abdominal pain.  Brief Narrative:  Katrina Sutton a 69 y.o.femalewith medical history significant forlarge paraesophageal hernia, ventral hernia status post repair 2 years prior with recurrent admissions for partial small bowel obstruction, treated conservatively, most recently from 7/29 to 8/5 as well as history of HTN, depression and anxiety, who presents with a 24-hour history of recurrent severe abdominal pain associated with bilious vomiting with inability to hold anything down. Terminal CT scan showed a small bowel obstruction. She is placed on IV fluids, n.p.o. General surgery consult obtained.  8/14. Patient had multiple loose stools today. Abdominal pain has resolved    Assessment & Plan:   Principal Problem:   Intestinal obstruction due to recurrent umbilical hernia Active Problems:   Depression with anxiety   Paraesophageal hernia   Hypertension   SBO (small bowel obstruction) (HCC)   AKI (acute kidney injury) ([place])  #1.  Small bowel obstruction secondary to umbilical hernia. Patient doing well today.  No additional abdominal pain or nausea vomiting.  Pending surgical decision.  Continue some IV fluids.  2.  Hypokalemia. Improved.  3.  Acute kidney injury. Improved.  4.  Essential hypertension. Continue Norvasc. DVT prophylaxis:SCDS Code Status:Full Family Communication:None Disposition Plan:  Patient came from:Home  Anticipated d/c place:Home  Barriers to d/c OR conditions which need to be met to effect a safe d/c:  We will discharge home tomorrow. Consultants:  General surgery.  Procedures:None Antimicrobials:None    Subjective: Patient slept well last night.  Does not have any  abdominal pain or nausea vomiting. No short of breath or cough. No fever or chills.  Objective: Vitals:   10/28/19 1140 10/28/19 1953 10/29/19 0428 10/29/19 0543  BP: (!) 149/78 (!) 158/75 (!) 168/98 (!) 157/85  Pulse: 82 93 83   Resp: 16 (!) 24 20   Temp: 98.4 F (36.9 C) 98.2 F (36.8 C) 97.9 F (36.6 C)   TempSrc: Oral Oral Oral   SpO2: 93% 92% 97%   Weight:      Height:        Intake/Output Summary (Last 24 hours) at 10/29/2019 0938 Last data filed at 10/28/2019 2356 Gross per 24 hour  Intake 1604 ml  Output 300 ml  Net 1304 ml   Filed Weights   10/26/19 2021 10/27/19 0033  Weight: 76 kg 74.6 kg    Examination:  General exam: Appears calm and comfortable  Respiratory system: Clear to auscultation. Respiratory effort normal. Cardiovascular system: S1 & S2 heard, RRR. No JVD, murmurs, rubs, gallops or clicks. No pedal edema. Gastrointestinal system: Abdomen is nondistended, soft and nontender. No organomegaly or masses felt. Normal bowel sounds heard. Central nervous system: Alert and oriented. No focal neurological deficits. Extremities: Symmetric 5 x 5 power. Skin: No rashes, lesions or ulcers Psychiatry: Judgement and insight appear normal. Mood & affect appropriate.     Data Reviewed: I have personally reviewed following labs and imaging studies  CBC: Recent Labs  Lab 10/25/19 1003 10/26/19 2037 10/27/19 0131 10/28/19 0638 10/29/19 0548  WBC 6.9 22.2* 20.6* 10.2 12.5*  NEUTROABS 4,865 18.9*  --  8.2* 10.0*  HGB 13.8 16.4* 14.7 12.2 13.5  HCT 41.9 48.0* 42.2 37.8 41.2  MCV 93.3 90.4 88.5 93.3 93.6  PLT 291 442* 319 263 628   Basic Metabolic Panel: Recent Labs  Lab 10/25/19 1003 10/26/19 2037 10/27/19 0131 10/28/19  9678 10/29/19 0548  NA 143 136 140 146* 143  K 3.5 3.1* 3.0* 3.3* 4.2  CL 103 97* 103 108 107  CO2 _0 GLUCOSE 96 200* 135* 113* 122*  BUN 4* 7* 7* 7* 6*  CREATININE 0.93 1.43* 1.16* 0.94 0.68  CALCIUM 9.3 10.1 8.8*  8.6* 9.0  MG  --   --   --  1.7 1.7  PHOS  --   --   --  2.6  --    GFR: Estimated Creatinine Clearance: 66.6 mL/min (by C-G formula based on SCr of 0.68 mg/dL). Liver Function Tests: Recent Labs  Lab 10/25/19 1003 10/26/19 2037 10/27/19 0131  AST 20 31 35  ALT _1 ALKPHOS  --  144* 128*  BILITOT 0.6 0.9 0.9  PROT 5.9* 8.2* 6.8  ALBUMIN  --  4.7 3.8   Recent Labs  Lab 10/26/19 2037  LIPASE 74*   No results for input(s): AMMONIA in the last 168 hours. Coagulation Profile: No results for input(s): INR, PROTIME in the last 168 hours. Cardiac Enzymes: No results for input(s): CKTOTAL, CKMB, CKMBINDEX, TROPONINI in the last 168 hours. BNP (last 3 results) No results for input(s): PROBNP in the last 8760 hours. HbA1C: No results for input(s): HGBA1C in the last 72 hours. CBG: No results for input(s): GLUCAP in the last 168 hours. Lipid Profile: No results for input(s): CHOL, HDL, LDLCALC, TRIG, CHOLHDL, LDLDIRECT in the last 72 hours. Thyroid Function Tests: No results for input(s): TSH, T4TOTAL, FREET4, T3FREE, THYROIDAB in the last 72 hours. Anemia Panel: No results for input(s): VITAMINB12, FOLATE, FERRITIN, TIBC, IRON, RETICCTPCT in the last 72 hours. Sepsis Labs: Recent Labs  Lab 10/26/19 2037 10/27/19 0131  LATICACIDVEN 1.7 0.9    Recent Results (from the past 240 hour(s))  SARS Coronavirus 2 by RT PCR (hospital order, performed in [facility] hospital lab) Nasopharyngeal Nasopharyngeal Swab     Status: None   Collection Time: 10/26/19 10:53 PM   Specimen: Nasopharyngeal Swab  Result Value Ref Range Status   SARS Coronavirus 2 NEGATIVE NEGATIVE Final    Comment: (NOTE) SARS-CoV-2 target nucleic acids are NOT DETECTED.  The SARS-CoV-2 RNA is generally detectable in upper and lower respiratory specimens during the acute phase of infection. The lowest concentration of SARS-CoV-2 viral copies this assay can detect is 250 copies / mL. A negative result  does not preclude SARS-CoV-2 infection and should not be used as the sole basis for treatment or other patient management decisions.  A negative result may occur with improper specimen collection / handling, submission of specimen other than nasopharyngeal swab, presence of viral mutation(s) within the areas targeted by this assay, and inadequate number of viral copies (<250 copies / mL). A negative result must be combined with clinical observations, patient history, and epidemiological information.  Fact Sheet for Patients:   StrictlyIdeas.no  Fact Sheet for Healthcare Providers: BankingDealers.co.za  This test is not yet approved or  cleared by the [place] FDA and has been authorized for detection and/or diagnosis of SARS-CoV-2 by FDA under an Emergency Use Authorization (EUA).  This EUA will remain in effect (meaning this test can be used) for the duration of the COVID-19 declaration under Section 564(b)(1) of the Act, 21 U.S.C. section 360bbb-3(b)(1), unless the authorization is terminated or revoked sooner.  Performed at [facility], [address], [place], [place] [address]          Radiology Studies:  DG Abd 1 View  Result Date: 10/27/2019 CLINICAL DATA:  Small-bowel obstruction. EXAM: ABDOMEN - 1 VIEW COMPARISON:  CT abdomen pelvis dated 10/26/2019, abdominal radiograph dated 10/14/2019. FINDINGS: Multiple mildly dilated air-filled loops of small bowel are seen in the abdomen. Air-fluid levels and free intraperitoneal air cannot be excluded on the supine exam. An enteric tube overlies the stomach which is located in the right lower thorax. IMPRESSION: 1. Enteric tube overlies the stomach which is located in the right lower thorax. 2. Multiple mildly dilated air-filled loops of small bowel are consistent with small bowel obstruction. Comparison with prior CT dated 10/26/2019 is difficult due to differences in  technique. Electronically Signed   By: Zerita Boers M.D.   On: 10/27/2019 11:37        Scheduled Meds: . amLODipine  5 mg Oral Daily   Continuous Infusions: . dextrose 5 % and 0.45 % NaCl with KCl 20 mEq/L 75 mL/hr at 10/29/19 0117     LOS: 3 days    Time spent: 24 minutes    Sharen Hones, MD [facility]   To contact the attending provider between 7A-7P or the covering provider during after hours 7P-7A, please log into the web site [email or web address] and access using universal [place] password for that web site. If you do not have the password, please call the hospital operator.  10/29/2019, 9:38 AM

## 2019-10-29 NOTE — Care Management Important Message (Signed)
Important Message  Patient Details  Name: Katrina Sutton MRN: [id number] Date of Birth: 04/24/1950   Medicare Important Message Given:  Yes     Bernadette Hoit 10/29/2019, 11:33 AM

## 2019-10-30 ENCOUNTER — Ambulatory Visit: Payer: Medicare HMO | Admitting: General Surgery

## 2019-10-30 LAB — BASIC METABOLIC PANEL
Anion gap: 9 (ref 5–15)
BUN: 6 mg/dL — ABNORMAL LOW (ref 8–23)
CO2: 26 mmol/L (ref 22–32)
Calcium: 8.9 mg/dL (ref 8.9–10.3)
Chloride: 105 mmol/L (ref 98–111)
Creatinine, Ser: 0.8 mg/dL (ref 0.44–1.00)
GFR calc Af Amer: >60 mL/min (ref >60)
GFR calc non Af Amer: >60 mL/min (ref >60)
Glucose, Bld: 114 mg/dL — ABNORMAL HIGH (ref 70–99)
Potassium: 3.8 mmol/L (ref 3.5–5.1)
Sodium: 140 mmol/L (ref 135–145)

## 2019-10-30 LAB — MAGNESIUM: Magnesium: 1.5 mg/dL — ABNORMAL LOW (ref 1.7–2.4)

## 2019-10-30 MED ORDER — MAGNESIUM SULFATE 50 % IJ SOLN
3.0000 g | Freq: Once | INTRAVENOUS | Status: AC
  Administered 2019-10-30: 3 g via INTRAVENOUS
  Filled 2019-10-30: qty 6

## 2019-10-30 MED ORDER — CLONAZEPAM 0.5 MG PO TABS
0.5000 mg | ORAL_TABLET | Freq: Two times a day (BID) | ORAL | Status: DC
  Administered 2019-10-30 – 2019-11-01 (×6): 0.5 mg via ORAL
  Filled 2019-10-30 (×6): qty 1

## 2019-10-30 MED ORDER — SIMETHICONE 80 MG PO CHEW
80.0000 mg | CHEWABLE_TABLET | Freq: Four times a day (QID) | ORAL | Status: DC | PRN
  Administered 2019-10-30 – 2019-10-31 (×4): 80 mg via ORAL
  Filled 2019-10-30 (×4): qty 1

## 2019-10-30 NOTE — Progress Notes (Signed)
PROGRESS NOTE    Katrina Sutton  FUX:[id number] DOB: Ettel 16, 1952 DOA: 10/26/2019 PCP: Verl Bangs, FNP   Chief complaint.  Abdominal pain.  Brief Narrative: Katrina Sutton a 69 y.o.femalewith medical history significant forlarge paraesophageal hernia, ventral hernia status post repair 2 years prior with recurrent admissions for partial small bowel obstruction, treated conservatively, most recently from 7/29 to 8/5 as well as history of HTN, depression and anxiety, who presents with a 24-hour history of recurrent severe abdominal pain associated with bilious vomiting with inability to hold anything down. Terminal CT scan showed a small bowel obstruction. She is placed on IV fluids, n.p.o. General surgery consult obtained.  8/14. Patient had multiple loose stools today. Abdominal pain has resolved.  8/17.  Condition resolved.  Hernia repair tomorrow.   Assessment & Plan:   Principal Problem:   Intestinal obstruction due to recurrent umbilical hernia Active Problems:   Depression with anxiety   Paraesophageal hernia   Hypertension   SBO (small bowel obstruction) (HCC)   AKI (acute kidney injury) ([place])  #1.  Small bowel obstruction secondary to ventral hernia. Condition has resolved.  Schedule surgery for hernia repair on 8/18.  #2.  Hypokalemia. Improved.  3.  Hypomagnesemia. Supplement.  4.  Acute kidney injury. Renal function normalized.  5.  Essential hypertension. Continue Norvasc.   DVT prophylaxis:SCDS Code Status:Full Family Communication:None Disposition Plan:  Patient came from:Home  Anticipated d/c place:Home  Barriers to d/c OR conditions which need to be met to effect a safe d/c:   Consultants:  General surgery.  Procedures:None Antimicrobials:None   Subjective: Patient doing well today, tolerating diet  without nausea vomiting.  Denies any short of breath or cough. No fever or chills.  Objective: Vitals:   10/29/19 0543 10/29/19 1155 10/29/19 1924 10/30/19 0452  BP: (!) 157/85 124/73 (!) 152/84 (!) 143/88  Pulse:  75 86 69  Resp:  _0 Temp:  98.5 F (36.9 C) 98.6 F (37 C) (!) 97.5 F (36.4 C)  TempSrc:  Oral Oral   SpO2:  93% 93% 95%  Weight:      Height:        Intake/Output Summary (Last 24 hours) at 10/30/2019 0909 Last data filed at 10/30/2019 0806 Gross per 24 hour  Intake 2643.75 ml  Output --  Net 2643.75 ml   Filed Weights   10/26/19 2021 10/27/19 0033  Weight: 76 kg 74.6 kg    Examination:  General exam: Appears calm and comfortable  Respiratory system: Clear to auscultation. Respiratory effort normal. Cardiovascular system: S1 & S2 heard, RRR. No JVD, murmurs, rubs, gallops or clicks. No pedal edema. Gastrointestinal system: Abdomen is nondistended, soft and nontender. No organomegaly or masses felt. Normal bowel sounds heard. Central nervous system: Alert and oriented. No focal neurological deficits. Extremities: Symmetric 5 x 5 power. Skin: No rashes, lesions or ulcers Psychiatry: Judgement and insight appear normal. Mood & affect appropriate.     Data Reviewed: I have personally reviewed following labs and imaging studies  CBC: Recent Labs  Lab 10/25/19 1003 10/26/19 2037 10/27/19 0131 10/28/19 0638 10/29/19 0548  WBC 6.9 22.2* 20.6* 10.2 12.5*  NEUTROABS 4,865 18.9*  --  8.2* 10.0*  HGB 13.8 16.4* 14.7 12.2 13.5  HCT 41.9 48.0* 42.2 37.8 41.2  MCV 93.3 90.4 88.5 93.3 93.6  PLT 291 442* 319 263 025   Basic Metabolic Panel: Recent Labs  Lab 10/26/19 2037 10/27/19 0131 10/28/19 4270 10/29/19 0548 10/30/19 0455  NA  136 140 146* 143 140  K 3.1* 3.0* 3.3* 4.2 3.8  CL 97* 103 108 107 105  CO2 _0 GLUCOSE 200* 135* 113* 122* 114*  BUN 7* 7* 7* 6* 6*  CREATININE 1.43* 1.16* 0.94 0.68 0.80  CALCIUM 10.1 8.8* 8.6* 9.0  8.9  MG  --   --  1.7 1.7 1.5*  PHOS  --   --  2.6  --   --    GFR: Estimated Creatinine Clearance: 66.6 mL/min (by C-G formula based on SCr of 0.8 mg/dL). Liver Function Tests: Recent Labs  Lab 10/25/19 1003 10/26/19 2037 10/27/19 0131  AST 20 31 35  ALT _1 ALKPHOS  --  144* 128*  BILITOT 0.6 0.9 0.9  PROT 5.9* 8.2* 6.8  ALBUMIN  --  4.7 3.8   Recent Labs  Lab 10/26/19 2037  LIPASE 74*   No results for input(s): AMMONIA in the last 168 hours. Coagulation Profile: No results for input(s): INR, PROTIME in the last 168 hours. Cardiac Enzymes: No results for input(s): CKTOTAL, CKMB, CKMBINDEX, TROPONINI in the last 168 hours. BNP (last 3 results) No results for input(s): PROBNP in the last 8760 hours. HbA1C: No results for input(s): HGBA1C in the last 72 hours. CBG: No results for input(s): GLUCAP in the last 168 hours. Lipid Profile: No results for input(s): CHOL, HDL, LDLCALC, TRIG, CHOLHDL, LDLDIRECT in the last 72 hours. Thyroid Function Tests: No results for input(s): TSH, T4TOTAL, FREET4, T3FREE, THYROIDAB in the last 72 hours. Anemia Panel: No results for input(s): VITAMINB12, FOLATE, FERRITIN, TIBC, IRON, RETICCTPCT in the last 72 hours. Sepsis Labs: Recent Labs  Lab 10/26/19 2037 10/27/19 0131  LATICACIDVEN 1.7 0.9    Recent Results (from the past 240 hour(s))  SARS Coronavirus 2 by RT PCR (hospital order, performed in [facility] hospital lab) Nasopharyngeal Nasopharyngeal Swab     Status: None   Collection Time: 10/26/19 10:53 PM   Specimen: Nasopharyngeal Swab  Result Value Ref Range Status   SARS Coronavirus 2 NEGATIVE NEGATIVE Final    Comment: (NOTE) SARS-CoV-2 target nucleic acids are NOT DETECTED.  The SARS-CoV-2 RNA is generally detectable in upper and lower respiratory specimens during the acute phase of infection. The lowest concentration of SARS-CoV-2 viral copies this assay can detect is 250 copies / mL. A negative result does not  preclude SARS-CoV-2 infection and should not be used as the sole basis for treatment or other patient management decisions.  A negative result may occur with improper specimen collection / handling, submission of specimen other than nasopharyngeal swab, presence of viral mutation(s) within the areas targeted by this assay, and inadequate number of viral copies (<250 copies / mL). A negative result must be combined with clinical observations, patient history, and epidemiological information.  Fact Sheet for Patients:   StrictlyIdeas.no  Fact Sheet for Healthcare Providers: BankingDealers.co.za  This test is not yet approved or  cleared by the [place] FDA and has been authorized for detection and/or diagnosis of SARS-CoV-2 by FDA under an Emergency Use Authorization (EUA).  This EUA will remain in effect (meaning this test can be used) for the duration of the COVID-19 declaration under Section 564(b)(1) of the Act, 21 U.S.C. section 360bbb-3(b)(1), unless the authorization is terminated or revoked sooner.  Performed at [facility], [address] [place], [place] [address]   Surgical pcr screen     Status: None   Collection Time: 10/29/19  7:20  PM   Specimen: Nasal Mucosa; Nasal Swab  Result Value Ref Range Status   MRSA, PCR NEGATIVE NEGATIVE Final   Staphylococcus aureus NEGATIVE NEGATIVE Final    Comment: (NOTE) The Xpert SA Assay (FDA approved for NASAL specimens in patients 36 years of age and older), is one component of a comprehensive surveillance program. It is not intended to diagnose infection nor to guide or monitor treatment. Performed at [facility], [address], [place], [place] [address]          Radiology Studies: No results found.      Scheduled Meds: . amLODipine  5 mg Oral Daily  . clonazePAM  0.5 mg Oral BID   Continuous Infusions: . magnesium sulfate bolus IVPB 3  g (10/30/19 0823)     LOS: 4 days    Time spent: 21 minutes    Sharen Hones, MD [facility]   To contact the attending provider between 7A-7P or the covering provider during after hours 7P-7A, please log into the web site [email or web address] and access using universal [place] password for that web site. If you do not have the password, please call the hospital operator.  10/30/2019, 9:09 AM

## 2019-10-30 NOTE — Progress Notes (Signed)
Mobility Specialist - Progress Note   10/30/19 1232  Mobility  Activity Ambulated in hall  Level of Assistance Standby assist, set-up cues, supervision of patient - no hands on  Assistive Device None  Distance Ambulated (ft) 340 ft  Mobility Response Tolerated well  Mobility performed by Mobility specialist  $Mobility charge 1 Mobility    Pre-mobility: 81 HR, 129/70 BP, 94% SpO2 During mobility: 97 HR, 94% SpO2 Post-mobility: 86 HR, 137/75 BP, 95% SpO2   Pt was lying in bed upon arrival. Pt eager to walk and agreed to session. Pt c/o minimal pain in R leg "2/10", but was not limited to mobility. Pt was independent in S2S and SBA during ambulation. Pt ambulated 170' when O2 desat to 88%. Mobility tech went over pursed-lip breathing exercises during standing break with pt where oxygen levels increased to 94% after 1 min. Pt denied any SOB and was motivated to continue session. Pt utilized the pursed-lip breathing technique throughout the rest of the walk (340' total), O2 remained above 94% throughout the remainder of the session. Overall, pt tolerated well. Pt was left in bed with phone/call bell in reach. Nurse was notified.    Filiberto Pinks Mobility Specialist 10/30/19, 12:45 PM

## 2019-10-30 NOTE — Progress Notes (Addendum)
[facility] SURGICAL PROGRESS NOTE (cpt [id number])  Hospital Day(s): 4.   Interval History: Patient seen and examined, no acute events or new complaints overnight. Patient reports she is feeling well, denies abdominal pain, distension, nausea, emesis. Renal function normal with sCr - 0.80. Good UP. She has mild hypomagnesemia to 1.5 but otherwise no other electrolyte derangement. She was advanced to full liquids yesterday and tolerated well with continued bowel function. Plan for umbilical hernia repair with Dr Lady Gary tomorrow morning.   Review of Systems:  Constitutional: denies fever, chills  HEENT: denies cough or congestion  Respiratory: denies any shortness of breath  Cardiovascular: denies chest pain or palpitations  Gastrointestinal: denies abdominal pain, N/V, or diarrhea/and bowel function as per interval history Genitourinary: denies burning with urination or urinary frequency Musculoskeletal: denies pain, decreased motor or sensation  Vital signs in last 24 hours: [min-max] current  Temp:  [97.5 F (36.4 C)-98.6 F (37 C)] 97.5 F (36.4 C) (08/17 0452) Pulse Rate:  [69-86] 69 (08/17 0452) Resp:  [18-20] 20 (08/17 0452) BP: (124-152)/(73-88) 143/88 (08/17 0452) SpO2:  [93 %-95 %] 95 % (08/17 0452)     Height: 5\' 6"  (167.6 cm) Weight: 74.6 kg BMI (Calculated): 26.56   Intake/Output last 2 shifts:  08/16 0701 - 08/17 0700 In: 2383.7 [P.O.:720; I.V.:1663.7] Out: -    Physical Exam:  Constitutional: alert, cooperative and no distress  HENT: normocephalic without obvious abnormality Respiratory: breathing non-labored at rest  Cardiovascular: regular rate and sinus rhythm  Gastrointestinal: Soft, non-tender, and non-distended, no rebound/guarding. I do not appreciate a periumbilical hernia with valsalva maneuvers  Musculoskeletal: no edema or wounds, motor and sensation grossly intact, NT    Labs:  CBC Latest Ref Rng & Units 10/29/2019 10/28/2019 10/27/2019   WBC 4.0 - 10.5 K/uL 12.5(H) 10.2 20.6(H)  Hemoglobin 12.0 - 15.0 g/dL 10/29/2019 93.2 67.1  Hematocrit 36 - 46 % 41.2 37.8 42.2  Platelets 150 - 400 K/uL 276 263 319   CMP Latest Ref Rng & Units 10/30/2019 10/29/2019 10/28/2019  Glucose 70 - 99 mg/dL 10/30/2019) 809(X) 833(A)  BUN 8 - 23 mg/dL 6(L) 6(L) 7(L)  Creatinine 0.44 - 1.00 mg/dL 250(N 3.97 6.73  Sodium 135 - 145 mmol/L 140 143 146(H)  Potassium 3.5 - 5.1 mmol/L 3.8 4.2 3.3(L)  Chloride 98 - 111 mmol/L 105 107 108  CO2 22 - 32 mmol/L 26 25 28   Calcium 8.9 - 10.3 mg/dL 8.9 9.0 4.19)  Total Protein 6.5 - 8.1 g/dL - - -  Total Bilirubin 0.3 - 1.2 mg/dL - - -  Alkaline Phos 38 - 126 U/L - - -  AST 15 - 41 U/L - - -  ALT 0 - 44 U/L - - -     Imaging studies: No new pertinent imaging studies   Assessment/Plan: (ICD-10's: K43.609) 69 y.o. female with mild hypomagnesemia otherwise doing well resolved small bowel obstruction following spontaneous reduction of paraumbilical recurrent hernia, complicated by known marked paraesophageal hernia.    - Plan for umbilical hernia repair tomorrow morning with Dr K54.20  - All risks, benefits, and alternatives to above procedure(s) were discussed with the patient, all of her questions were answered to her expressed satisfaction, patient expresses she wishes to proceed, and informed consent was obtained.  - Continue full liquids today; NPO at midnight  - Continue gentle IVF resuscitation; increase at midnight when NPO  - Replete Mg2+; monitor  - Monitor abdominal examination; on-going bowel function  - Mobilization encouraged              -  Further management per primary service; we will follow    - Hold DVT prophylaxis   All of the above findings and recommendations were discussed with the patient, and the medical team, and all of patient's questions were answered to her expressed satisfaction.  -- Lynden Oxford, PA-C [facility] 10/30/2019, 8:41 AM [phone] M-F: 7am -  4pm  I saw and evaluated the patient.  I agree with the above documentation, exam, and plan, which I have edited where appropriate. Duanne Guess  9:55 AM

## 2019-10-31 ENCOUNTER — Inpatient Hospital Stay: Payer: Medicare HMO | Admitting: Certified Registered Nurse Anesthetist

## 2019-10-31 ENCOUNTER — Encounter
Admission: EM | Disposition: A | Discharge status: Home / Self Care | Payer: Self-pay | Source: Home / Self Care | Attending: Internal Medicine

## 2019-10-31 ENCOUNTER — Encounter: Payer: Self-pay | Admitting: General Surgery

## 2019-10-31 DIAGNOSIS — K420 Umbilical hernia with obstruction, without gangrene: Secondary | ICD-10-CM

## 2019-10-31 HISTORY — PX: UMBILICAL HERNIA REPAIR: SHX196

## 2019-10-31 LAB — CBC
HCT: 41.5 % (ref 36.0–46.0)
Hemoglobin: 14 g/dL (ref 12.0–15.0)
MCH: 30.6 pg (ref 26.0–34.0)
MCHC: 33.7 g/dL (ref 30.0–36.0)
MCV: 90.8 fL (ref 80.0–100.0)
Platelets: 304 10*3/uL (ref 150–400)
RBC: 4.57 MIL/uL (ref 3.87–5.11)
RDW: 13.2 % (ref 11.5–15.5)
WBC: 7.7 10*3/uL (ref 4.0–10.5)
nRBC: 0 % (ref 0.0–0.2)

## 2019-10-31 LAB — BASIC METABOLIC PANEL
Anion gap: 12 (ref 5–15)
BUN: 8 mg/dL (ref 8–23)
CO2: 24 mmol/L (ref 22–32)
Calcium: 9.4 mg/dL (ref 8.9–10.3)
Chloride: 105 mmol/L (ref 98–111)
Creatinine, Ser: 0.95 mg/dL (ref 0.44–1.00)
GFR calc Af Amer: >60 mL/min (ref >60)
GFR calc non Af Amer: >60 mL/min (ref >60)
Glucose, Bld: 86 mg/dL (ref 70–99)
Potassium: 3.8 mmol/L (ref 3.5–5.1)
Sodium: 141 mmol/L (ref 135–145)

## 2019-10-31 LAB — MAGNESIUM: Magnesium: 2.3 mg/dL (ref 1.7–2.4)

## 2019-10-31 SURGERY — REPAIR, HERNIA, UMBILICAL, ADULT
Anesthesia: General

## 2019-10-31 MED ORDER — PROPOFOL 10 MG/ML IV BOLUS
INTRAVENOUS | Status: DC | PRN
  Administered 2019-10-31: 150 mg via INTRAVENOUS

## 2019-10-31 MED ORDER — PHENYLEPHRINE HCL (PRESSORS) 10 MG/ML IV SOLN
INTRAVENOUS | Status: DC | PRN
  Administered 2019-10-31: 100 ug via INTRAVENOUS
  Administered 2019-10-31 (×2): 200 ug via INTRAVENOUS
  Administered 2019-10-31: 100 ug via INTRAVENOUS
  Administered 2019-10-31: 200 ug via INTRAVENOUS

## 2019-10-31 MED ORDER — SUGAMMADEX SODIUM 200 MG/2ML IV SOLN
INTRAVENOUS | Status: DC | PRN
  Administered 2019-10-31: 200 mg via INTRAVENOUS

## 2019-10-31 MED ORDER — LACTATED RINGERS IV SOLN: INTRAVENOUS | Status: DC

## 2019-10-31 MED ORDER — LIDOCAINE-EPINEPHRINE 1 %-1:100000 IJ SOLN
INTRAMUSCULAR | Status: AC
  Filled 2019-10-31: qty 1

## 2019-10-31 MED ORDER — ACETAMINOPHEN 10 MG/ML IV SOLN
INTRAVENOUS | Status: DC | PRN
  Administered 2019-10-31: 1000 mg via INTRAVENOUS

## 2019-10-31 MED ORDER — SODIUM CHLORIDE (PF) 0.9 % IJ SOLN
INTRAMUSCULAR | Status: AC
  Filled 2019-10-31: qty 20

## 2019-10-31 MED ORDER — MIDAZOLAM HCL 2 MG/2ML IJ SOLN
INTRAMUSCULAR | Status: DC | PRN
  Administered 2019-10-31: 2 mg via INTRAVENOUS

## 2019-10-31 MED ORDER — FENTANYL CITRATE (PF) 100 MCG/2ML IJ SOLN
INTRAMUSCULAR | Status: DC | PRN
  Administered 2019-10-31: 100 ug via INTRAVENOUS

## 2019-10-31 MED ORDER — FENTANYL CITRATE (PF) 100 MCG/2ML IJ SOLN
INTRAMUSCULAR | Status: AC
  Filled 2019-10-31: qty 2

## 2019-10-31 MED ORDER — LIDOCAINE HCL (PF) 2 % IJ SOLN
INTRAMUSCULAR | Status: AC
  Filled 2019-10-31: qty 5

## 2019-10-31 MED ORDER — CEFAZOLIN SODIUM 1 G IJ SOLR
INTRAMUSCULAR | Status: AC
  Filled 2019-10-31: qty 20

## 2019-10-31 MED ORDER — SUCCINYLCHOLINE CHLORIDE 20 MG/ML IJ SOLN
INTRAMUSCULAR | Status: DC | PRN
  Administered 2019-10-31: 120 mg via INTRAVENOUS

## 2019-10-31 MED ORDER — SODIUM CHLORIDE 0.9 % IV SOLN
INTRAVENOUS | Status: DC | PRN
  Administered 2019-10-31: 30 ug/min via INTRAVENOUS

## 2019-10-31 MED ORDER — BUPIVACAINE LIPOSOME 1.3 % IJ SUSP
INTRAMUSCULAR | Status: DC | PRN
  Administered 2019-10-31: 20 mL

## 2019-10-31 MED ORDER — DEXAMETHASONE SODIUM PHOSPHATE 10 MG/ML IJ SOLN
INTRAMUSCULAR | Status: DC | PRN
  Administered 2019-10-31: 8 mg via INTRAVENOUS

## 2019-10-31 MED ORDER — FENTANYL CITRATE (PF) 100 MCG/2ML IJ SOLN
INTRAMUSCULAR | Status: AC
  Administered 2019-10-31: 25 ug via INTRAVENOUS
  Filled 2019-10-31: qty 2

## 2019-10-31 MED ORDER — PROPOFOL 500 MG/50ML IV EMUL
INTRAVENOUS | Status: DC | PRN
Start: 2019-10-31 — End: 2019-10-31
  Administered 2019-10-31: 20 ug/kg/min via INTRAVENOUS

## 2019-10-31 MED ORDER — ONDANSETRON HCL 4 MG/2ML IJ SOLN: 4.0000 mg | Freq: Once | INTRAMUSCULAR | Status: DC | PRN

## 2019-10-31 MED ORDER — CEFAZOLIN SODIUM-DEXTROSE 2-3 GM-%(50ML) IV SOLR
INTRAVENOUS | Status: DC | PRN
  Administered 2019-10-31: 2 g via INTRAVENOUS

## 2019-10-31 MED ORDER — PROPOFOL 10 MG/ML IV BOLUS
INTRAVENOUS | Status: AC
  Filled 2019-10-31: qty 20

## 2019-10-31 MED ORDER — BUPIVACAINE LIPOSOME 1.3 % IJ SUSP
INTRAMUSCULAR | Status: AC
  Filled 2019-10-31: qty 20

## 2019-10-31 MED ORDER — BUPIVACAINE HCL (PF) 0.25 % IJ SOLN
INTRAMUSCULAR | Status: AC
  Filled 2019-10-31: qty 30

## 2019-10-31 MED ORDER — EPHEDRINE SULFATE 50 MG/ML IJ SOLN
INTRAMUSCULAR | Status: DC | PRN
  Administered 2019-10-31: 10 mg via INTRAVENOUS
  Administered 2019-10-31: 5 mg via INTRAVENOUS

## 2019-10-31 MED ORDER — SUGAMMADEX SODIUM 500 MG/5ML IV SOLN
INTRAVENOUS | Status: AC
  Filled 2019-10-31: qty 5

## 2019-10-31 MED ORDER — MIDAZOLAM HCL 2 MG/2ML IJ SOLN
INTRAMUSCULAR | Status: AC
  Filled 2019-10-31: qty 2

## 2019-10-31 MED ORDER — LIDOCAINE HCL (CARDIAC) PF 100 MG/5ML IV SOSY
PREFILLED_SYRINGE | INTRAVENOUS | Status: DC | PRN
  Administered 2019-10-31: 80 mg via INTRAVENOUS

## 2019-10-31 MED ORDER — ACETAMINOPHEN 10 MG/ML IV SOLN
INTRAVENOUS | Status: AC
  Filled 2019-10-31: qty 100

## 2019-10-31 MED ORDER — LIDOCAINE-EPINEPHRINE 1 %-1:100000 IJ SOLN
INTRAMUSCULAR | Status: DC | PRN
  Administered 2019-10-31: 9 mL via INTRAMUSCULAR

## 2019-10-31 MED ORDER — ROCURONIUM BROMIDE 100 MG/10ML IV SOLN
INTRAVENOUS | Status: DC | PRN
  Administered 2019-10-31: 10 mg via INTRAVENOUS
  Administered 2019-10-31: 30 mg via INTRAVENOUS

## 2019-10-31 MED ORDER — FENTANYL CITRATE (PF) 100 MCG/2ML IJ SOLN
25.0000 ug | INTRAMUSCULAR | Status: DC | PRN
  Administered 2019-10-31 (×4): 25 ug via INTRAVENOUS

## 2019-10-31 SURGICAL SUPPLY — 33 items
BLADE SURG 15 STRL LF DISP TIS (BLADE) ×1 IMPLANT
BLADE SURG 15 STRL SS (BLADE) ×1
CANISTER SUCT 1200ML W/VALVE (MISCELLANEOUS) ×2 IMPLANT
CHLORAPREP W/TINT 26 (MISCELLANEOUS) ×2 IMPLANT
COVER WAND RF STERILE (DRAPES) ×2 IMPLANT
DERMABOND ADVANCED (GAUZE/BANDAGES/DRESSINGS) ×1
DERMABOND ADVANCED .7 DNX12 (GAUZE/BANDAGES/DRESSINGS) ×1 IMPLANT
DRAPE LAPAROTOMY 77X122 PED (DRAPES) ×2 IMPLANT
ELECT CAUTERY BLADE TIP 2.5 (TIP) ×2
ELECT REM PT RETURN 9FT ADLT (ELECTROSURGICAL) ×2
ELECTRODE CAUTERY BLDE TIP 2.5 (TIP) ×1 IMPLANT
ELECTRODE REM PT RTRN 9FT ADLT (ELECTROSURGICAL) ×1 IMPLANT
GLOVE BIO SURGEON STRL SZ 6.5 (GLOVE) ×2 IMPLANT
GLOVE INDICATOR 7.0 STRL GRN (GLOVE) ×4 IMPLANT
GOWN STRL REUS W/ TWL LRG LVL3 (GOWN DISPOSABLE) ×2 IMPLANT
GOWN STRL REUS W/TWL LRG LVL3 (GOWN DISPOSABLE) ×2
KIT TURNOVER KIT A (KITS) ×2 IMPLANT
LABEL OR SOLS (LABEL) ×2 IMPLANT
MESH VENTRALEX ST 2.5 CRC MED (Mesh General) ×2 IMPLANT
NEEDLE HYPO 22GX1.5 SAFETY (NEEDLE) ×4 IMPLANT
NS IRRIG 500ML POUR BTL (IV SOLUTION) ×2 IMPLANT
PACK BASIN MINOR (MISCELLANEOUS) ×2 IMPLANT
STRIP CLOSURE SKIN 1/2X4 (GAUZE/BANDAGES/DRESSINGS) ×2 IMPLANT
SUT ETHIBOND NAB MO 7 #0 18IN (SUTURE) IMPLANT
SUT MNCRL 4-0 (SUTURE) ×1
SUT MNCRL 4-0 27XMFL (SUTURE) ×1
SUT VIC AB 3-0 SH 27 (SUTURE) ×1
SUT VIC AB 3-0 SH 27X BRD (SUTURE) ×1 IMPLANT
SUT VICRYL 2-0 SH 8X27 (SUTURE) IMPLANT
SUTURE MNCRL 4-0 27XMF (SUTURE) ×1 IMPLANT
SYR 10ML LL (SYRINGE) ×2 IMPLANT
SYR 20ML LL LF (SYRINGE) ×2 IMPLANT
SYR BULB IRRIG 60ML STRL (SYRINGE) ×2 IMPLANT

## 2019-10-31 NOTE — Transfer of Care (Signed)
Immediate Anesthesia Transfer of Care Note  Patient: Katrina Sutton  Procedure(s) Performed: HERNIA REPAIR UMBILICAL ADULT (N/A )  Patient Location: PACU  Anesthesia Type:General  Level of Consciousness: drowsy  Airway & Oxygen Therapy: Patient Spontanous Breathing and Patient connected to face mask oxygen  Post-op Assessment: Report given to RN and Post -op Vital signs reviewed and stable  Post vital signs: Reviewed and stable  Last Vitals:  Vitals Value Taken Time  BP 140/70 10/31/19 1201  Temp    Pulse 87 10/31/19 1202  Resp 16 10/31/19 1202  SpO2 100 % 10/31/19 1202  Vitals shown include unvalidated device data.  Last Pain:  Vitals:   10/31/19 1201  TempSrc:   PainSc: Asleep         Complications: No complications documented.

## 2019-10-31 NOTE — Anesthesia Postprocedure Evaluation (Signed)
Anesthesia Post Note  Patient: Katrina Sutton  Procedure(s) Performed: HERNIA REPAIR UMBILICAL ADULT (N/A )  Patient location during evaluation: PACU Anesthesia Type: General Level of consciousness: awake and alert Pain management: pain level controlled Vital Signs Assessment: post-procedure vital signs reviewed and stable Respiratory status: spontaneous breathing and respiratory function stable Cardiovascular status: stable Anesthetic complications: no   No complications documented.   Last Vitals:  Vitals:   10/31/19 1316 10/31/19 1319  BP: 132/79   Pulse: 86 81  Resp: (!) 21 14  Temp: (!) 36.4 C   SpO2: 92% 90%    Last Pain:  Vitals:   10/31/19 1316  TempSrc:   PainSc: 3                  Aracelie Addis K

## 2019-10-31 NOTE — Anesthesia Procedure Notes (Addendum)
Procedure Name: Intubation Date/Time: 10/31/2019 10:19 AM Performed by: Rudean Hitt, CRNA Pre-anesthesia Checklist: Patient identified, Patient being monitored, Timeout performed, Emergency Drugs available and Suction available Patient Re-evaluated:Patient Re-evaluated prior to induction Oxygen Delivery Method: Circle system utilized Preoxygenation: Pre-oxygenation with 100% oxygen Induction Type: IV induction, Cricoid Pressure applied and Rapid sequence Laryngoscope Size: Mac and 3 Grade View: Grade I Tube type: Oral Tube size: 7.0 mm Number of attempts: 1 Airway Equipment and Method: Stylet Placement Confirmation: ETT inserted through vocal cords under direct vision,  positive ETCO2 and breath sounds checked- equal and bilateral Secured at: 21 cm Tube secured with: Tape Dental Injury: Teeth and Oropharynx as per pre-operative assessment

## 2019-10-31 NOTE — Anesthesia Preprocedure Evaluation (Signed)
Anesthesia Evaluation  Patient identified by MRN, date of birth, ID band Patient awake    Reviewed: Allergy & Precautions, H&P , NPO status , Patient's Chart, lab work & pertinent test results, reviewed documented beta blocker date and time   Airway Mallampati: II  TM Distance: >3 FB Neck ROM: full    Dental  (+) Teeth Intact   Pulmonary shortness of breath, former smoker,    Pulmonary exam normal        Cardiovascular hypertension, On Medications Normal cardiovascular exam Rhythm:regular Rate:Normal     Neuro/Psych PSYCHIATRIC DISORDERS Anxiety Depression Bipolar Disorder negative neurological ROS     GI/Hepatic Neg liver ROS, hiatal hernia, GERD  Medicated,  Endo/Other  negative endocrine ROS  Renal/GU Renal disease  negative genitourinary   Musculoskeletal   Abdominal   Peds  Hematology negative hematology ROS (+)   Anesthesia Other Findings Past Medical History: No date: Allergy     Comment:  seasonal No date: Anxiety No date: Arthritis     Comment:  osteoarthritis No date: Depression No date: GERD (gastroesophageal reflux disease) No date: Hiatal hernia Past Surgical History: 05/31/2018: CHOLECYSTECTOMY; N/A     Comment:  Procedure: LAPAROSCOPIC CHOLECYSTECTOMY;  Surgeon:               Duanne Guess, MD;  Location: [facility] ORS;  Service:               General;  Laterality: N/A; No date: TONSILLECTOMY 05/31/2018: UMBILICAL HERNIA REPAIR; N/A     Comment:  Procedure: HERNIA REPAIR UMBILICAL ADULT;  Surgeon:               Duanne Guess, MD;  Location: [facility] ORS;  Service:               General;  Laterality: N/A; No date: WRIST SURGERY; Left BMI    Body Mass Index: 26.55 kg/m     Reproductive/Obstetrics negative OB ROS                             Anesthesia Physical Anesthesia Plan  ASA: III  Anesthesia Plan: General ETT   Post-op Pain Management:    Induction:    PONV Risk Score and Plan: 4 or greater  Airway Management Planned:   Additional Equipment:   Intra-op Plan:   Post-operative Plan:   Informed Consent: I have reviewed the patients History and Physical, chart, labs and discussed the procedure including the risks, benefits and alternatives for the proposed anesthesia with the patient or authorized representative who has indicated his/her understanding and acceptance.     Dental Advisory Given  Plan Discussed with: CRNA  Anesthesia Plan Comments:         Anesthesia Quick Evaluation

## 2019-10-31 NOTE — Progress Notes (Signed)
PROGRESS NOTE    Katrina Sutton  YKD:[id number] DOB: Jun 04, 1950 DOA: 10/26/2019 PCP: Tarri Fuller, FNP   Chief complaint.  Abdominal pain.  Brief Narrative: Katrina T Rileyis a 69 y.o.femalewith medical history significant forlarge paraesophageal hernia, ventral hernia status post repair 2 years prior with recurrent admissions for partial small bowel obstruction, treated conservatively, most recently from 7/29 to 8/5 as well as history of HTN, depression and anxiety, who presents with a 24-hour history of recurrent severe abdominal pain associated with bilious vomiting with inability to hold anything down. Terminal CT scan showed a small bowel obstruction. She is placed on IV fluids, n.p.o. General surgery consult obtained.   Assessment & Plan:   Principal Problem:   Intestinal obstruction due to recurrent umbilical hernia Active Problems:   Depression with anxiety   Paraesophageal hernia   Hypertension   SBO (small bowel obstruction) (HCC)   AKI (acute kidney injury) (HCC)  #1.  Small bowel obstruction secondary to ventral hernia. Condition has resolved.   --Hernia repair today  #2.  Hypokalemia. --recheck and replete PRN  3.  Hypomagnesemia. --recheck and replete PRN  4.  Acute kidney injury. Renal function normalized. --Hold further MIVF.  Encourage oral hydration.  5.  Essential hypertension. Continue Norvasc.   DVT prophylaxis: SCD/Compression stockings Code Status: Full code  Family Communication: Husband updated at bedside today Status is: inpatient Dispo:   The patient is from: home Anticipated d/c is to: home Anticipated d/c date is: tomorrow Patient currently is not medically stable to d/c due to: just post-op today, need to advance diet and surgical clearance.   Consultants:  General surgery.  Procedures:None Antimicrobials:None   Subjective: Pt underwent hernia repair today.  Tolerated surgery well.  Pain controlled afterwards,  tolerating diet.   Objective: Vitals:   10/31/19 1316 10/31/19 1319 10/31/19 1352 10/31/19 1523  BP: 132/79  140/79 132/80  Pulse: 86 81 78 85  Resp: (!) 21 14 15    Temp: (!) 97.5 F (36.4 C)  97.7 F (36.5 C) 97.8 F (36.6 C)  TempSrc:   Oral Oral  SpO2: 92% 90% 96% 91%  Weight:      Height:        Intake/Output Summary (Last 24 hours) at 10/31/2019 1945 Last data filed at 10/31/2019 1300 Gross per 24 hour  Intake 1120 ml  Output 2 ml  Net 1118 ml   Filed Weights   10/26/19 2021 10/27/19 0033  Weight: 76 kg 74.6 kg    Examination:  Constitutional: NAD, AAOx3 HEENT: conjunctivae and lids normal, EOMI CV: RRR no M,R,G. Distal pulses +2.  No cyanosis.   RESP: CTA B/L, normal respiratory effort  GI: +BS, incision site clean and dry Extremities: No effusions, edema, or tenderness in BLE SKIN: warm, dry and intact Neuro: II - XII grossly intact.  Sensation intact Psych: Normal mood and affect.  Appropriate judgement and reason   Data Reviewed: I have personally reviewed following labs and imaging studies  CBC: Recent Labs  Lab 10/25/19 1003 10/25/19 1003 10/26/19 2037 10/27/19 0131 10/28/19 0638 10/29/19 0548 10/31/19 0516  WBC 6.9   < > 22.2* 20.6* 10.2 12.5* 7.7  NEUTROABS 4,865  --  18.9*  --  8.2* 10.0*  --   HGB 13.8   < > 16.4* 14.7 12.2 13.5 14.0  HCT 41.9   < > 48.0* 42.2 37.8 41.2 41.5  MCV 93.3   < > 90.4 88.5 93.3 93.6 90.8  PLT 291   < >  442* 319 263 276 304   < > = values in this interval not displayed.   Basic Metabolic Panel: Recent Labs  Lab 10/27/19 0131 10/28/19 9811 10/29/19 0548 10/30/19 0455 10/31/19 0516  NA 140 146* 143 140 141  K 3.0* 3.3* 4.2 3.8 3.8  CL 103 108 107 105 105  CO2 25 28 25 26 24   GLUCOSE 135* 113* 122* 114* 86  BUN 7* 7* 6* 6* 8  CREATININE 1.16* 0.94 0.68 0.80 0.95  CALCIUM 8.8* 8.6* 9.0 8.9 9.4  MG  --  1.7 1.7 1.5* 2.3  PHOS  --  2.6  --   --   --    GFR: Estimated Creatinine Clearance: 56.1 mL/min  (by C-G formula based on SCr of 0.95 mg/dL). Liver Function Tests: Recent Labs  Lab 10/25/19 1003 10/26/19 2037 10/27/19 0131  AST 20 31 35  ALT 23 27 29   ALKPHOS  --  144* 128*  BILITOT 0.6 0.9 0.9  PROT 5.9* 8.2* 6.8  ALBUMIN  --  4.7 3.8   Recent Labs  Lab 10/26/19 2037  LIPASE 74*   No results for input(s): AMMONIA in the last 168 hours. Coagulation Profile: No results for input(s): INR, PROTIME in the last 168 hours. Cardiac Enzymes: No results for input(s): CKTOTAL, CKMB, CKMBINDEX, TROPONINI in the last 168 hours. BNP (last 3 results) No results for input(s): PROBNP in the last 8760 hours. HbA1C: No results for input(s): HGBA1C in the last 72 hours. CBG: No results for input(s): GLUCAP in the last 168 hours. Lipid Profile: No results for input(s): CHOL, HDL, LDLCALC, TRIG, CHOLHDL, LDLDIRECT in the last 72 hours. Thyroid Function Tests: No results for input(s): TSH, T4TOTAL, FREET4, T3FREE, THYROIDAB in the last 72 hours. Anemia Panel: No results for input(s): VITAMINB12, FOLATE, FERRITIN, TIBC, IRON, RETICCTPCT in the last 72 hours. Sepsis Labs: Recent Labs  Lab 10/26/19 2037 10/27/19 0131  LATICACIDVEN 1.7 0.9    Recent Results (from the past 240 hour(s))  SARS Coronavirus 2 by RT PCR (hospital order, performed in [facility] hospital lab) Nasopharyngeal Nasopharyngeal Swab     Status: None   Collection Time: 10/26/19 10:53 PM   Specimen: Nasopharyngeal Swab  Result Value Ref Range Status   SARS Coronavirus 2 NEGATIVE NEGATIVE Final    Comment: (NOTE) SARS-CoV-2 target nucleic acids are NOT DETECTED.  The SARS-CoV-2 RNA is generally detectable in upper and lower respiratory specimens during the acute phase of infection. The lowest concentration of SARS-CoV-2 viral copies this assay can detect is 250 copies / mL. A negative result does not preclude SARS-CoV-2 infection and should not be used as the sole basis for treatment or other patient management  decisions.  A negative result may occur with improper specimen collection / handling, submission of specimen other than nasopharyngeal swab, presence of viral mutation(s) within the areas targeted by this assay, and inadequate number of viral copies (<250 copies / mL). A negative result must be combined with clinical observations, patient history, and epidemiological information.  Fact Sheet for Patients:   [facility]  Fact Sheet for Healthcare Providers: 10/28/19  This test is not yet approved or  cleared by the [employer] FDA and has been authorized for detection and/or diagnosis of SARS-CoV-2 by FDA under an Emergency Use Authorization (EUA).  This EUA will remain in effect (meaning this test can be used) for the duration of the COVID-19 declaration under Section 564(b)(1) of the Act, 21 U.S.C. section 360bbb-3(b)(1), unless the authorization is  terminated or revoked sooner.  Performed at [facility], [address], [place], [place] [address]   Surgical pcr screen     Status: None   Collection Time: 10/29/19  7:20 PM   Specimen: Nasal Mucosa; Nasal Swab  Result Value Ref Range Status   MRSA, PCR NEGATIVE NEGATIVE Final   Staphylococcus aureus NEGATIVE NEGATIVE Final    Comment: (NOTE) The Xpert SA Assay (FDA approved for NASAL specimens in patients 37 years of age and older), is one component of a comprehensive surveillance program. It is not intended to diagnose infection nor to guide or monitor treatment. Performed at [facility], [address], [place], [place] [address]          Radiology Studies: No results found.      Scheduled Meds: . amLODipine  5 mg Oral Daily  . clonazePAM  0.5 mg Oral BID   Continuous Infusions: . lactated ringers 75 mL/hr at 10/31/19 1343     LOS: 5 days     Darlin Priestly, MD [facility]   To contact the attending provider  between 7A-7P or the covering provider during after hours 7P-7A, please log into the web site [email or web address] and access using universal [place] password for that web site. If you do not have the password, please call the hospital operator.  10/31/2019, 7:45 PM

## 2019-10-31 NOTE — Progress Notes (Addendum)
[facility] SURGICAL PROGRESS NOTE  Hospital Day(s): 5.   Post op day(s): Day of Surgery  Interval History:  Patient seen and examined no acute events or new complaints overnight. Patient reports is doing well and ready for surgery No fever, chills, nausea, emesis  Labs are grossly unremarkable Tolerated fluid liquids yesterday, NPO at midnight No issues with bowel function or ambulation  Plan for umbilical hernia repair this morning with Dr Lady Gary  Vital signs in last 24 hours: [min-max] current  Temp:  [97.4 F (36.3 C)-97.7 F (36.5 C)] 97.7 F (36.5 C) (08/18 0523) Pulse Rate:  [78-87] 87 (08/18 0523) Resp:  [18-20] 18 (08/18 0523) BP: (132-160)/(81-91) 160/91 (08/18 0523) SpO2:  [92 %-95 %] 95 % (08/18 0523)     Height: 5\' 6"  (167.6 cm) Weight: 74.6 kg BMI (Calculated): 26.56   Intake/Output last 2 shifts:  08/17 0701 - 08/18 0700 In: 1180 [P.O.:920; I.V.:260] Out: -    Physical Exam:  Constitutional: alert, cooperative and no distress  HENT: normocephalic without obvious abnormality Respiratory: breathing non-labored at rest  Cardiovascular: regular rate and sinus rhythm  Gastrointestinal: Soft, non-tender, and non-distended, no rebound/guarding. I do not appreciate a periumbilical hernia with valsalva maneuvers  Musculoskeletal: no edema or wounds, motor and sensation grossly intact, NT   Labs:  CBC Latest Ref Rng & Units 10/31/2019 10/29/2019 10/28/2019  WBC 4.0 - 10.5 K/uL 7.7 12.5(H) 10.2  Hemoglobin 12.0 - 15.0 g/dL 10/30/2019 84.1 66.0  Hematocrit 36 - 46 % 41.5 41.2 37.8  Platelets 150 - 400 K/uL 304 276 263   CMP Latest Ref Rng & Units 10/31/2019 10/30/2019 10/29/2019  Glucose 70 - 99 mg/dL 86 10/31/2019) 160(F)  BUN 8 - 23 mg/dL 8 6(L) 6(L)  Creatinine 0.44 - 1.00 mg/dL 093(A 3.55 7.32  Sodium 135 - 145 mmol/L 141 140 143  Potassium 3.5 - 5.1 mmol/L 3.8 3.8 4.2  Chloride 98 - 111 mmol/L 105 105 107  CO2 22 - 32 mmol/L 24 26 25   Calcium 8.9 - 10.3  mg/dL 9.4 8.9 9.0  Total Protein 6.5 - 8.1 g/dL - - -  Total Bilirubin 0.3 - 1.2 mg/dL - - -  Alkaline Phos 38 - 126 U/L - - -  AST 15 - 41 U/L - - -  ALT 0 - 44 U/L - - -    Imaging studies: No new pertinent imaging studies   Assessment/Plan:  69 y.o. female with resolved small bowel obstruction following spontaneous reduction of paraumbilical recurrent hernia, complicated by known marked paraesophageal hernia.    - Plan for umbilical hernia repair this morning with Dr             - All risks, benefits, and alternatives to above procedure(s) were discussed with the patient, all of her questions were answered to her expressed satisfaction, patient expresses she wishes to proceed, and informed consent was obtained   - NPO             - Continue IVF resuscitation             - Monitor abdominal examination; on-going bowel function             - Mobilization encouraged              - Further management per primary service; we will follow               - Hold DVT prophylaxis   All of the above findings  and recommendations were discussed with the patient, and the medical team, and all of patient's questions were answered to her expressed satisfaction   -- Lynden Oxford, PA-C  [facility] 10/31/2019, 7:27 AM [phone] M-F: 7am - 4pm  I saw and evaluated the patient.  I agree with the above documentation, exam, and plan, which I have edited where appropriate. Duanne Guess  9:22 AM

## 2019-10-31 NOTE — Care Management Important Message (Signed)
Important Message  Patient Details  Name: Katrina Sutton MRN: [id number] Date of Birth: 1951-02-22   Medicare Important Message Given:  Yes     Bernadette Hoit 10/31/2019, 12:00 PM

## 2019-10-31 NOTE — Op Note (Signed)
Operative Note  Pre-operative Diagnosis: recurrent supraumbilical hernia  Post-operative Diagnosis: same  Operation: recurrent supraumbilical hernia repair with mesh  Surgeon: Duanne Guess, MD  Anesthesia: GETA  Assistant:none  Findings: The patient's tissues were of poor integrity.  There was a loop of bowel densely adherent to the sutures used to perform the previous repair  Estimated Blood Loss: less than 5cc         Drains: none         Specimens: none          Complications: none immediately apparent         Condition: stable  Procedure Details  The patient was identified in the preoperative holding area. The benefits, complications, treatment options, and expected outcomes were discussed with the patient. The risks of bleeding, infection, recurrence of symptoms, failure to resolve symptoms, bowel injury, any of which could require further surgery were reviewed with the patient. The patient agreed to accept these risks. The patient was then taken to the operating room, identified as Katrina Sutton and the procedure verified.  A time out was performed and the above information confirmed.  Prior to the induction of general anesthesia, antibiotic prophylaxis was administered. VTE prophylaxis was in place. General endotracheal anesthesia was then administered and tolerated well. After induction, the abdomen was prepped with Chloraprep and draped in standard sterile fashion. The patient was positioned in the supine position.  The skin overlying the hernia was infiltrated with a 1:1 mixture of 0.25% bupivacaine and 1% lidocaine with epinephrine.  A vertical midline incision was created over the hernia sac and electrocautery was used to dissect through subcutaneous tissue. The hernia was dissected free from adjacent tissue and fascia.  The fascia itself was quite mushy in texture.  There was a moderate amount of scar tissue present from the prior surgical repair.  The hernia sac was  entered and the sac was excised.  There was a loop of small intestine densely adherent to the sutures used for the prior repair.  Care was taken to avoid any injury to the bowel as it was carefully teased away from the area.  A small portion of fascia was left attached to the bowel in order to avoid creating an enterotomy.  A medium VentraLex mesh was introduced and attached to the fascia using interrupted 0 Ethibond sutures in standard fashion.  The fascia was then closed over the mesh.  The fascia was infiltrated with liposomal bupivacaine.  The cutaneous tissue was closed with 3-0 Vicryl and skin was closed with a subcuticular 4-0 Monocryl. Dermabond was applied to the skin, followed by Steri-Strips.  The patient was then awakened, extubated, and taken to the postanesthesia care unit in good condition.  Patient tolerated procedure well and there were no immediate complications identified. Needle, instrument, and sponge counts were reported to be correct by the nursing staff.  Duanne Guess, MD FACS

## 2019-11-01 ENCOUNTER — Telehealth: Payer: Self-pay

## 2019-11-01 LAB — BASIC METABOLIC PANEL
Anion gap: 10 (ref 5–15)
BUN: 11 mg/dL (ref 8–23)
CO2: 26 mmol/L (ref 22–32)
Calcium: 9.1 mg/dL (ref 8.9–10.3)
Chloride: 106 mmol/L (ref 98–111)
Creatinine, Ser: 0.9 mg/dL (ref 0.44–1.00)
GFR calc Af Amer: >60 mL/min (ref >60)
GFR calc non Af Amer: >60 mL/min (ref >60)
Glucose, Bld: 74 mg/dL (ref 70–99)
Potassium: 3.9 mmol/L (ref 3.5–5.1)
Sodium: 142 mmol/L (ref 135–145)

## 2019-11-01 LAB — CBC
HCT: 41.1 % (ref 36.0–46.0)
Hemoglobin: 14.3 g/dL (ref 12.0–15.0)
MCH: 31 pg (ref 26.0–34.0)
MCHC: 34.8 g/dL (ref 30.0–36.0)
MCV: 89.2 fL (ref 80.0–100.0)
Platelets: 295 10*3/uL (ref 150–400)
RBC: 4.61 MIL/uL (ref 3.87–5.11)
RDW: 13.1 % (ref 11.5–15.5)
WBC: 12 10*3/uL — ABNORMAL HIGH (ref 4.0–10.5)
nRBC: 0 % (ref 0.0–0.2)

## 2019-11-01 LAB — MAGNESIUM: Magnesium: 2 mg/dL (ref 1.7–2.4)

## 2019-11-01 MED ORDER — OXYCODONE HCL 5 MG PO TABS: 5.0000 mg | ORAL_TABLET | Freq: Four times a day (QID) | ORAL | 0 refills | Status: AC | PRN

## 2019-11-01 MED ORDER — OXYCODONE HCL 5 MG PO TABS: 5.0000 mg | ORAL_TABLET | ORAL | Status: DC | PRN

## 2019-11-01 MED ORDER — OXYCODONE HCL 5 MG PO TABS
5.0000 mg | ORAL_TABLET | ORAL | Status: DC | PRN
  Administered 2019-11-01: 5 mg via ORAL
  Filled 2019-11-01: qty 1

## 2019-11-01 NOTE — Progress Notes (Signed)
Mobility Specialist - Progress Note   11/01/19 1201  Mobility  Activity Ambulated in hall  Level of Assistance Standby assist, set-up cues, supervision of patient - no hands on  Assistive Device None  Distance Ambulated (ft) 350 ft  Mobility Response Tolerated well  Mobility performed by Mobility specialist  $Mobility charge 1 Mobility    Pre-mobility: 77 HR, 136/72 BP, 95% SpO2 During mobility: 97 HR, 93% SpO2 Post-mobility: 87 HR, 143/78 BP, 93% SpO2   Pt was lying in bed upon arrival. Pt c/o minimal pain in abdomen "2/10 after taking pain medication", but did not limit mobility. Pt was independent in sit-to-stand and SBA while ambulating. No assistive device was used during ambulation. Pt ambulated 160' when O2 desat to 87%. After going over pursed-lip breathing exercises with pt, O2 sat and maintained above 93%, utilizing PLB technique throughout remainder of session. Pt denied any SOB or dizziness. Overall, pt tolerated session well. Pt was left in bed with phone/call bell in reach.    Filiberto Pinks Mobility Specialist 11/01/19, 12:12 PM

## 2019-11-01 NOTE — Discharge Instructions (Signed)
In addition to included general post-operative instructions for umbilical hernia repair,  Diet: Resume home diet.   Activity: No heavy lifting >20 pounds (children, pets, laundry, garbage) for 6 weeks, but light activity and walking are encouraged. Do not drive or drink alcohol if taking narcotic pain medications or having pain that might distract from driving.  Wound care: Maintain steri-strips, these will likely stay on until your follow up appointment. 2 days after surgery (08/20), you may shower/get incision wet with soapy water and pat dry (do not rub incisions), but no baths or submerging incision underwater until follow-up.   Medications: Resume all home medications. For mild to moderate pain: acetaminophen (Tylenol) or ibuprofen/naproxen (if no kidney disease). Combining Tylenol with alcohol can substantially increase your risk of causing liver disease. Narcotic pain medications, if prescribed, can be used for severe pain, though may cause nausea, constipation, and drowsiness. Do not combine Tylenol and Percocet (or similar) within a 6 hour period as Percocet (and similar) contain(s) Tylenol. If you do not need the narcotic pain medication, you do not need to fill the prescription.  Call office [phone] / [phone]) at any time if any questions, worsening pain, fevers/chills, bleeding, drainage from incision site, or other concerns.

## 2019-11-01 NOTE — Discharge Summary (Addendum)
Physician Discharge Summary   Katrina Sutton  female DOB: 06-15-1950  HAL:[id number]  PCP: Tarri Fuller, FNP  Admit date: 10/26/2019 Discharge date: 11/01/2019  Admitted From: home Disposition:  home CODE STATUS: Full code  Discharge Instructions    No wound care   Complete by: As directed        Hospital Course:  For full details, please see H&P, progress notes, consult notes and ancillary notes.  Briefly,  Katrina T Rileyis a 69 y.o.Caucasian femalewith medical history significant forlarge paraesophageal hernia, ventral hernia status post repair 2 years prior with recurrent admissions for partial small bowel obstruction, treated conservatively, most recently from 7/29 to 8/5 as well as history of HTN, depression and anxiety, who presented with a 24-hour history of recurrent severe abdominal pain associated with bilious vomiting with inability to hold anything down.  Small bowel obstruction secondary to ventral hernia S/p hernia repair on 10/31/19 CT scan showed a small bowel obstruction. She is placed on IV fluids, n.p.o. Pt received Hernia repair on 8/18.  Obstruction relieved and pt was tolerating regular diet prior to discharge.  Hypokalemia. Repleted PRN  Hypomagnesemia. repleted PRN  Acute kidney injury. Cr 1.43 on presentation, likely due to dehydration from GI loss and poor oral intake.  Cr improved with IVF hydration.  Cr back to baseline 0.9 on the day of discharge.  Essential hypertension. Home Lisinopril held during hospitalization due to AKI, and amlodipine was prescribed instead.  At discharge, AKI resolved, so BP switched back to home Lisinopril.   Discharge Diagnoses:  Principal Problem:   Intestinal obstruction due to recurrent umbilical hernia Active Problems:   Depression with anxiety   Paraesophageal hernia   Hypertension   SBO (small bowel obstruction) (HCC)   AKI (acute kidney injury) [facility])    Discharge Instructions:  Allergies as  of 11/01/2019      Reactions   Erythromycin Shortness Of Breath, Rash   Duloxetine Palpitations      Medication List    TAKE these medications   acetaminophen 500 MG tablet Commonly known as: TYLENOL Take 500 mg by mouth every 8 (eight) hours as needed for headache.   clonazePAM 1 MG tablet Commonly known as: KLONOPIN TAKE 1 TABLET BY MOUTH TWICE A DAY AS NEEDED ANXIETY   fluticasone 50 MCG/ACT nasal spray Commonly known as: FLONASE Place 1 spray into both nostrils daily as needed.   hydrocortisone cream 1 % Apply 1 application topically 2 (two) times daily as needed for itching.   linaclotide 145 MCG Caps capsule Commonly known as: Linzess Take 1 capsule (145 mcg total) by mouth daily before breakfast.   lisinopril 10 MG tablet Commonly known as: ZESTRIL Take 1 tablet (10 mg total) by mouth daily.   omeprazole 40 MG capsule Commonly known as: PRILOSEC Take 1 capsule (40 mg total) by mouth daily. In morning What changed: Another medication with the same name was removed. Continue taking this medication, and follow the directions you see here.   oxyCODONE 5 MG immediate release tablet Commonly known as: Oxy IR/ROXICODONE Take 1 tablet (5 mg total) by mouth every 6 (six) hours as needed for up to 5 days for severe pain or breakthrough pain.   PARoxetine 40 MG tablet Commonly known as: PAXIL TAKE 1 TABLET BY MOUTH DAILY   polyethylene glycol 17 g packet Commonly known as: MIRALAX / GLYCOLAX Take 17 g by mouth daily. What changed: Another medication with the same name was removed. Continue taking this  medication, and follow the directions you see here.   sodium chloride 0.65 % Soln nasal spray Commonly known as: OCEAN Place 1 spray into both nostrils as needed for congestion.        Follow-up Information    Donovan Kail, PA-C. Schedule an appointment as soon as possible for a visit in 2 week(s).   Specialty: Physician Assistant Why: s/p umbilical hernia  repair Contact information: [address] [place] [address] [phone]        Tarri Fuller, FNP. Schedule an appointment as soon as possible for a visit in 1 week(s).   Specialty: Family Medicine Contact information: [address] [place] [address] [phone]               Allergies  Allergen Reactions  . Erythromycin Shortness Of Breath and Rash  . Duloxetine Palpitations     The results of significant diagnostics from this hospitalization (including imaging, microbiology, ancillary and laboratory) are listed below for reference.   Consultations:   Procedures/Studies: DG Abd 1 View  Result Date: 10/27/2019 CLINICAL DATA:  Small-bowel obstruction. EXAM: ABDOMEN - 1 VIEW COMPARISON:  CT abdomen pelvis dated 10/26/2019, abdominal radiograph dated 10/14/2019. FINDINGS: Multiple mildly dilated air-filled loops of small bowel are seen in the abdomen. Air-fluid levels and free intraperitoneal air cannot be excluded on the supine exam. An enteric tube overlies the stomach which is located in the right lower thorax. IMPRESSION: 1. Enteric tube overlies the stomach which is located in the right lower thorax. 2. Multiple mildly dilated air-filled loops of small bowel are consistent with small bowel obstruction. Comparison with prior CT dated 10/26/2019 is difficult due to differences in technique. Electronically Signed   By: Romona Curls M.D.   On: 10/27/2019 11:37   DG Abd 1 View  Result Date: 10/14/2019 CLINICAL DATA:  Follow-up small bowel obstruction. EXAM: ABDOMEN - 1 VIEW COMPARISON:  10/13/2019 and earlier, including CT abdomen and pelvis 10/12/2019. FINDINGS: Improvement in the partial small bowel obstruction since yesterday, though there are scattered persistent mildly distended loops of small bowel in the mid and upper abdomen. Gas and high attenuation stool within normal caliber ascending and transverse colon. No suggestion of free air on the supine  image. Surgical clips in the RIGHT UPPER QUADRANT from prior cholecystectomy. Phleboliths in the LEFT side of the pelvis. IMPRESSION: Improvement in the partial small bowel obstruction since yesterday, though there are persistent mildly distended loops of small bowel in the mid and upper abdomen. Electronically Signed   By: Hulan Saas M.D.   On: 10/14/2019 12:31   DG Abd 1 View  Result Date: 10/13/2019 CLINICAL DATA:  Abdominal distension. Admitted yesterday for abdominal pain, nausea and vomiting. EXAM: ABDOMEN - 1 VIEW COMPARISON:  Plain film of the abdomen dated 10/12/2019. CT abdomen dated 10/12/2019. FINDINGS: Distended gas-filled loops of small bowel are again seen throughout the abdomen and pelvis, compatible with the findings of partial small bowel obstruction on earlier CT abdomen. No evidence of free intraperitoneal air. Cholecystectomy clips in the RIGHT upper quadrant. No acute or suspicious osseous finding. Again noted is RIGHT were displaced of the nasogastric tube, stable. Underlying bowel loops are likely associated with the [place] off a GIA lymph hiatal hernia better demonstrated on earlier CT abdomen. IMPRESSION: 1. Persistent evidence of small-bowel obstruction. 2. Stable rightward course of the nasogastric tube. As described on the earlier plain film report of 10/12/2019, this is an unusual course for a nasogastric tube  but does correspond to the anatomy seen on recent CT abdomen related to the large paraesophageal hernia resulting in the stomach being located to the RIGHT of midline. Electronically Signed   By: Bary Richard M.D.   On: 10/13/2019 12:34   DG Abdomen 1 View  Result Date: 10/12/2019 CLINICAL DATA:  Nasogastric tube placement EXAM: ABDOMEN - 1 VIEW COMPARISON:  CT abdomen and pelvis October 12, 2019 FINDINGS: Note that the right hemidiaphragm is markedly elevated with large paraesophageal type hernia with stomach to the right of midline. Given this unusual appearance,  nasogastric tube tip and side-port felt to be present within the stomach. There is no bowel dilatation or air-fluid level to suggest bowel obstruction. No free air. There is bibasilar atelectasis. IMPRESSION: Unusual course of the nasogastric tube felt to be secondary to large paraesophageal hernia with stomach located to the right of midline in the mid chest region. Nasogastric tube tip and side port are felt to reside within the stomach; would advise confirmation of gastric aspirate in this regard given only single view for assessment currently. No bowel obstruction or free air. Electronically Signed   By: Bretta Bang III M.D.   On: 10/12/2019 05:58   CT ABDOMEN PELVIS W CONTRAST  Result Date: 10/26/2019 CLINICAL DATA:  Abdominal distension EXAM: CT ABDOMEN AND PELVIS WITH CONTRAST TECHNIQUE: Multidetector CT imaging of the abdomen and pelvis was performed using the standard protocol following bolus administration of intravenous contrast. CONTRAST:  OMNIPAQUE IOHEXOL 300 MG/ML  SOLN COMPARISON:  10/12/2019 FINDINGS: Lower chest: Large hiatal hernia in the right lower chest. This includes much of the stomach and a portion of the transverse colon. Right base atelectasis. Hepatobiliary: Small hypodensity in the right hepatic lobe, stable since prior study, likely cysts. Prior cholecystectomy. Pancreas: No focal abnormality or ductal dilatation. Spleen: No focal abnormality.  Normal size. Adrenals/Urinary Tract: No adrenal abnormality. No focal renal abnormality. No stones or hydronephrosis. Urinary bladder is unremarkable. Stomach/Bowel: Dilated fluid-filled small bowel loops are seen in the abdomen and pelvis bleeding to a small umbilical hernia containing a small bowel loop compatible with small bowel obstruction. Distal to the hernia, small bowel is decompressed. Appendix and colon are unremarkable. Vascular/Lymphatic: No evidence of aneurysm or adenopathy. Reproductive: Uterus and adnexa  unremarkable.  No mass. Other: No free fluid or free air. Musculoskeletal: No acute bony abnormality. IMPRESSION: Small bowel obstruction due to umbilical hernia containing a small bowel loop. This is similar to prior study. Large hiatal hernia containing much of the stomach and a portion of the mid transverse colon. Electronically Signed   By: Charlett Nose M.D.   On: 10/26/2019 21:15   CT ABDOMEN PELVIS W CONTRAST  Result Date: 10/12/2019 CLINICAL DATA:  Nausea vomiting, question of pancreatitis EXAM: CT ABDOMEN AND PELVIS WITH CONTRAST TECHNIQUE: Multidetector CT imaging of the abdomen and pelvis was performed using the standard protocol following bolus administration of intravenous contrast. CONTRAST:  OMNIPAQUE IOHEXOL 300 MG/ML  SOLN COMPARISON:  None. FINDINGS: Lower chest: The visualized heart size within normal limits. No pericardial fluid/thickening. There is a very large right-sided paraesophageal hernia with the majority of the stomach contents herniated within the chest. There is also a small portion of the transverse colon. The visualized lung bases are clear. Hepatobiliary: The liver is normal in density without focal abnormality.The main portal vein is patent. The patient is status post cholecystectomy. No biliary ductal dilation. Pancreas: Unremarkable. No pancreatic ductal dilatation or surrounding inflammatory changes. Spleen: Normal  in size without focal abnormality. Adrenals/Urinary Tract: Both adrenal glands appear normal. Multiple tiny low-density lesions seen within both kidneys. Bladder is unremarkable. Stomach/Bowel: Lung as noted above there is a again noted a large paraesophageal hernia with the entirety of the stomach herniated within the chest wall. There is also a small portion of the transverse colon within the hernia sac. There is mildly dilated loops of jejunum and ileum down to the level distal ileum/terminal ileum. A small portion of ileum appears to be within the  anterior abdominal wall hernia sac with a focal area of narrowing. Scattered colonic diverticula are noted. Vascular/Lymphatic: There are no enlarged mesenteric, retroperitoneal, or pelvic lymph nodes. Scattered aortic atherosclerotic calcifications are seen without aneurysmal dilatation. Reproductive: The uterus and adnexa are unremarkable. Other: There is a small anterior abdominal wall hernia containing a small amount of fluid and transverse colon. Musculoskeletal: No acute or significant osseous findings. IMPRESSION: Mildly dilated proximal small bowel to the level of the anterior abdominal wall hernia where there is a focal area of narrowing within the distal ileal loops, likely due to partial small bowel obstruction. Again noted is a large paraesophageal hernia and a small portion of transverse colon seen within the hernia sac. Aortic Atherosclerosis (ICD10-I70.0). Electronically Signed   By: Jonna Clark M.D.   On: 10/12/2019 00:38   DG Chest Portable 1 View  Result Date: 10/11/2019 CLINICAL DATA:  Shortness of breath EXAM: PORTABLE CHEST 1 VIEW COMPARISON:  05/08/2019 radiograph and chest CT, CT 03/09/2018 FINDINGS: Large right-sided hernia containing air-filled bowel. Opacity at the right base probably reflects chronic atelectasis. Partially obscured cardiomediastinal silhouette with aortic atherosclerosis. No pleural effusion or pneumothorax. IMPRESSION: Large right-sided hernia containing air-filled bowel. Probable chronic atelectasis at the right base. Electronically Signed   By: Jasmine Pang M.D.   On: 10/11/2019 23:55      Labs: BNP (last 3 results) Recent Labs    05/08/19 1625  BNP 59.0   Basic Metabolic Panel: Recent Labs  Lab 10/28/19 0638 10/29/19 0548 10/30/19 0455 10/31/19 0516 11/01/19 0431  NA 146* 143 140 141 142  K 3.3* 4.2 3.8 3.8 3.9  CL 108 107 105 105 106  CO2 28 25 26 24 26   GLUCOSE 113* 122* 114* 86 74  BUN 7* 6* 6* 8 11  CREATININE 0.94 0.68 0.80 0.95  0.90  CALCIUM 8.6* 9.0 8.9 9.4 9.1  MG 1.7 1.7 1.5* 2.3 2.0  PHOS 2.6  --   --   --   --    Liver Function Tests: Recent Labs  Lab 10/26/19 2037 10/27/19 0131  AST 31 35  ALT 27 29  ALKPHOS 144* 128*  BILITOT 0.9 0.9  PROT 8.2* 6.8  ALBUMIN 4.7 3.8   Recent Labs  Lab 10/26/19 2037  LIPASE 74*   No results for input(s): AMMONIA in the last 168 hours. CBC: Recent Labs  Lab 10/26/19 2037 10/26/19 2037 10/27/19 0131 10/28/19 10/30/19 10/29/19 0548 10/31/19 0516 11/01/19 0431  WBC 22.2*   < > 20.6* 10.2 12.5* 7.7 12.0*  NEUTROABS 18.9*  --   --  8.2* 10.0*  --   --   HGB 16.4*   < > 14.7 12.2 13.5 14.0 14.3  HCT 48.0*   < > 42.2 37.8 41.2 41.5 41.1  MCV 90.4   < > 88.5 93.3 93.6 90.8 89.2  PLT 442*   < > 319 263 276 304 295   < > = values in this interval not displayed.  Cardiac Enzymes: No results for input(s): CKTOTAL, CKMB, CKMBINDEX, TROPONINI in the last 168 hours. BNP: Invalid input(s): POCBNP CBG: No results for input(s): GLUCAP in the last 168 hours. D-Dimer No results for input(s): DDIMER in the last 72 hours. Hgb A1c No results for input(s): HGBA1C in the last 72 hours. Lipid Profile No results for input(s): CHOL, HDL, LDLCALC, TRIG, CHOLHDL, LDLDIRECT in the last 72 hours. Thyroid function studies No results for input(s): TSH, T4TOTAL, T3FREE, THYROIDAB in the last 72 hours.  Invalid input(s): FREET3 Anemia work up No results for input(s): VITAMINB12, FOLATE, FERRITIN, TIBC, IRON, RETICCTPCT in the last 72 hours. Urinalysis    Component Value Date/Time   COLORURINE YELLOW (A) 10/13/2019 0925   APPEARANCEUR HAZY (A) 10/13/2019 0925   APPEARANCEUR Clear 06/28/2011 1412   LABSPEC 1.030 10/13/2019 0925   LABSPEC 1.005 06/28/2011 1412   PHURINE 5.0 10/13/2019 0925   GLUCOSEU NEGATIVE 10/13/2019 0925   GLUCOSEU Negative 06/28/2011 1412   HGBUR SMALL (A) 10/13/2019 0925   BILIRUBINUR NEGATIVE 10/13/2019 0925   BILIRUBINUR neg 09/13/2019 0850    BILIRUBINUR Negative 06/28/2011 1412   KETONESUR 5 (A) 10/13/2019 0925   PROTEINUR 30 (A) 10/13/2019 0925   UROBILINOGEN 0.2 09/13/2019 0850   NITRITE NEGATIVE 10/13/2019 0925   LEUKOCYTESUR MODERATE (A) 10/13/2019 0925   LEUKOCYTESUR Negative 06/28/2011 1412   Sepsis Labs Invalid input(s): PROCALCITONIN,  WBC,  LACTICIDVEN Microbiology Recent Results (from the past 240 hour(s))  SARS Coronavirus 2 by RT PCR (hospital order, performed in [facility] hospital lab) Nasopharyngeal Nasopharyngeal Swab     Status: None   Collection Time: 10/26/19 10:53 PM   Specimen: Nasopharyngeal Swab  Result Value Ref Range Status   SARS Coronavirus 2 NEGATIVE NEGATIVE Final    Comment: (NOTE) SARS-CoV-2 target nucleic acids are NOT DETECTED.  The SARS-CoV-2 RNA is generally detectable in upper and lower respiratory specimens during the acute phase of infection. The lowest concentration of SARS-CoV-2 viral copies this assay can detect is 250 copies / mL. A negative result does not preclude SARS-CoV-2 infection and should not be used as the sole basis for treatment or other patient management decisions.  A negative result may occur with improper specimen collection / handling, submission of specimen other than nasopharyngeal swab, presence of viral mutation(s) within the areas targeted by this assay, and inadequate number of viral copies (<250 copies / mL). A negative result must be combined with clinical observations, patient history, and epidemiological information.  Fact Sheet for Patients:   BoilerBrush.com.cy  Fact Sheet for Healthcare Providers: https://pope.com/  This test is not yet approved or  cleared by the [place] FDA and has been authorized for detection and/or diagnosis of SARS-CoV-2 by FDA under an Emergency Use Authorization (EUA).  This EUA will remain in effect (meaning this test can be used) for the duration of the COVID-19  declaration under Section 564(b)(1) of the Act, 21 U.S.C. section 360bbb-3(b)(1), unless the authorization is terminated or revoked sooner.  Performed at [facility], [address], [place], [place] [address]   Surgical pcr screen     Status: None   Collection Time: 10/29/19  7:20 PM   Specimen: Nasal Mucosa; Nasal Swab  Result Value Ref Range Status   MRSA, PCR NEGATIVE NEGATIVE Final   Staphylococcus aureus NEGATIVE NEGATIVE Final    Comment: (NOTE) The Xpert SA Assay (FDA approved for NASAL specimens in patients 92 years of age and older), is one component of a comprehensive surveillance program.  It is not intended to diagnose infection nor to guide or monitor treatment. Performed at [facility], [address] [place], [place] [id number]      Total time spend on discharging this patient, including the last patient exam, discussing the hospital stay, instructions for ongoing care as it relates to all pertinent caregivers, as well as preparing the medical discharge records, prescriptions, and/or referrals as applicable, is 30 minutes.    Darlin Priestlyina Kent Riendeau, MD  [facility] 11/01/2019, 11:40 AM  If 7PM-7AM, please contact night-coverage

## 2019-11-01 NOTE — Progress Notes (Signed)
[facility] SURGICAL PROGRESS NOTE  Hospital Day(s): 6.   Post op day(s): 1 Day Post-Op.   Interval History:  Patient seen and examined no acute events or new complaints overnight.  Patient reports she has some incisional soreness at her umbilicus but this is improved compared to last night No fever, chills, nausea, or emesis Slight leukocytosis to 12K, likely reactive from surgery Otherwise labs are grossly normal She has been on CLD post-op and tolerating well Continued bowel function No issues with ambulation  Vital signs in last 24 hours: [min-max] current  Temp:  [97.5 F (36.4 C)-98.5 F (36.9 C)] 98.4 F (36.9 C) (08/19 0427) Pulse Rate:  [72-86] 72 (08/19 0427) Resp:  [12-23] 18 (08/19 0427) BP: (129-157)/(69-97) 151/85 (08/19 0427) SpO2:  [90 %-100 %] 93 % (08/19 0427)     Height: 5\' 6"  (167.6 cm) Weight: 74.6 kg BMI (Calculated): 26.56   Intake/Output last 2 shifts:  08/18 0701 - 08/19 0700 In: 1993.7 [I.V.:1993.7] Out: 2 [Blood:2]   Physical Exam:  Constitutional: alert, cooperative and no distress  HENT: normocephalic without obvious abnormality Respiratory: breathing non-labored at rest  Cardiovascular: regular rate and sinus rhythm  Gastrointestinal:Soft, umbilical incisional soreness, and non-distended,no rebound/guarding.  Integumentary: Umbilical incision is CDI with steri-strips, there is some ecchymosis, no erythema or drainage  Musculoskeletal: no edema or wounds, motor and sensation grossly intact, NT  Labs:  CBC Latest Ref Rng & Units 11/01/2019 10/31/2019 10/29/2019  WBC 4.0 - 10.5 K/uL 12.0(H) 7.7 12.5(H)  Hemoglobin 12.0 - 15.0 g/dL 10/31/2019 94.7 65.4  Hematocrit 36 - 46 % 41.1 41.5 41.2  Platelets 150 - 400 K/uL 295 304 276   CMP Latest Ref Rng & Units 11/01/2019 10/31/2019 10/30/2019  Glucose 70 - 99 mg/dL 74 86 11/01/2019)  BUN 8 - 23 mg/dL 11 8 6(L)  Creatinine 354(S - 1.00 mg/dL 5.68 1.27 5.17  Sodium 135 - 145 mmol/L 142 141 140   Potassium 3.5 - 5.1 mmol/L 3.9 3.8 3.8  Chloride 98 - 111 mmol/L 106 105 105  CO2 22 - 32 mmol/L 26 24 26   Calcium 8.9 - 10.3 mg/dL 9.1 9.4 8.9  Total Protein 6.5 - 8.1 g/dL - - -  Total Bilirubin 0.3 - 1.2 mg/dL - - -  Alkaline Phos 38 - 126 U/L - - -  AST 15 - 41 U/L - - -  ALT 0 - 44 U/L - - -     Imaging studies: No new pertinent imaging studies   Assessment/Plan:  69 y.o. female overall doing well with expected post-op soreness 1 Day Post-Op s/p umbilical hernia repair for recurrent umbilical hernia   - Advance to regular diet  - Wean from IVF resuscitation  - Pain control prn; antiemetics prn  - Monitor abdominal examination; on-going bowel function    - Mobilization encouraged  - Further management per primary service    - Discharge Planning: If she tolerates advancement of diet today then I think it is reasonable to discharge home from surgical standpoint, I did review post-op instructions and restrictions, I will be happy to see her in clinic in ~ 2 weeks.    All of the above findings and recommendations were discussed with the patient, patient's family (son at bedside), and the medical team, and all of patient's and family's questions were answered to their expressed satisfaction.  -- , PA-C [facility] 11/01/2019, 7:45 AM [phone] M-F: 7am - 4pm

## 2019-11-02 ENCOUNTER — Telehealth: Payer: Self-pay

## 2019-11-02 ENCOUNTER — Other Ambulatory Visit: Payer: Self-pay | Admitting: Family Medicine

## 2019-11-02 DIAGNOSIS — F419 Anxiety disorder, unspecified: Secondary | ICD-10-CM

## 2019-11-02 NOTE — Telephone Encounter (Signed)
Transition Care Management Follow-up Telephone Call  Date of discharge and from where: 11/01/2019 [facility]  How have you been since you were released from the hospital? ok  Any questions or concerns? No  Items Reviewed:  Did the pt receive and understand the discharge instructions provided? Yes   Medications obtained and verified? Yes   Any new allergies since your discharge? No   Dietary orders reviewed? No  Do you have support at home? Yes   Functional Questionnaire: (I = Independent and D = Dependent) ADLs: I  Bathing/Dressing- I  Meal Prep- I  Eating- I  Maintaining continence- I  Transferring/Ambulation- I  Managing Meds- I  Follow up appointments reviewed:   PCP Hospital f/u appt confirmed? Yes  Scheduled to see Danielle Rankin  on 11/05/2019  @ 11:20.  Are transportation arrangements needed? No   If their condition worsens, is the pt aware to call PCP or go to the Emergency Dept.? Yes  Was the patient provided with contact information for the PCP's office or ED? Yes  Was to pt encouraged to call back with questions or concerns? Yes

## 2019-11-02 NOTE — Telephone Encounter (Signed)
[place]ally [place], 11/02/19.  According to PMP aware, pt last received a refill on 10/04/2019.  Refill of her controlled substance will be provided today.

## 2019-11-02 NOTE — Telephone Encounter (Signed)
Requested medication (s) are due for refill today - yes  Requested medication (s) are on the active medication list -yes  Future visit scheduled -yes  Last refill: 10/04/19  Notes to clinic: Request for non delegated Rx  Requested Prescriptions  Pending Prescriptions Disp Refills   clonazePAM (KLONOPIN) 1 MG tablet [Pharmacy Med Name: CLONAZEPAM 1 MG TAB] 60 tablet     Sig: TAKE 1 TABLET BY MOUTH TWICE A DAY AS NEEDED ANXIETY      Not Delegated - Psychiatry:  Anxiolytics/Hypnotics Failed - 11/02/2019  1:57 PM      Failed - This refill cannot be delegated      Failed - Urine Drug Screen completed in last 360 days.      Passed - Valid encounter within last 6 months    Recent Outpatient Visits           1 week ago Constipation, unspecified constipation type   [facility], Jodelle Gross, FNP   1 month ago Essential hypertension   [facility], Jodelle Gross, FNP   2 months ago Hypertension, unspecified type   [facility], Jodelle Gross, FNP   5 months ago Paraesophageal hernia   [facility], Jodelle Gross, FNP   5 months ago Leg swelling   [facility], Jodelle Gross, FNP       Future Appointments             In 3 days Malfi, Jodelle Gross, FNP [facility]   In 2 weeks Malfi, Jodelle Gross, FNP [facility]   In 1 month Malfi, Jodelle Gross, FNP [facility], [facility]   In 1 month Malfi, Jodelle Gross, FNP [facility], [facility]   In 7 months  [facility]                Requested Prescriptions  Pending Prescriptions Disp Refills   clonazePAM (KLONOPIN) 1 MG tablet [Pharmacy Med Name: CLONAZEPAM 1 MG TAB] 60 tablet     Sig: TAKE 1 TABLET BY MOUTH TWICE A DAY AS NEEDED ANXIETY      Not Delegated - Psychiatry:  Anxiolytics/Hypnotics Failed - 11/02/2019  1:57 PM      Failed - This refill cannot be delegated      Failed -  Urine Drug Screen completed in last 360 days.      Passed - Valid encounter within last 6 months    Recent Outpatient Visits           1 week ago Constipation, unspecified constipation type   [facility], Jodelle Gross, FNP   1 month ago Essential hypertension   [facility], Jodelle Gross, FNP   2 months ago Hypertension, unspecified type   [facility], Jodelle Gross, FNP   5 months ago Paraesophageal hernia   [facility], Jodelle Gross, FNP   5 months ago Leg swelling   [facility], Jodelle Gross, FNP       Future Appointments             In 3 days Malfi, Jodelle Gross, FNP [facility]   In 2 weeks Malfi, Jodelle Gross, FNP [facility]   In 1 month Malfi, Jodelle Gross, FNP [facility], [facility]   In  1 month Malfi, Jodelle Gross, FNP [facility], [facility]   In 7 months  [facility], [place]

## 2019-11-02 NOTE — Telephone Encounter (Signed)
1-2x per week, if finding readings are consistently over 130/80, would like to see back in the office for appointment.

## 2019-11-02 NOTE — Telephone Encounter (Signed)
Copied from CRM [id number]. Topic: General - Inquiry >> Nov 02, 2019 11:30 AM Deborha Payment wrote: Reason for CRM: Patient is requesting a nurse to call back to know how many times she needs to check her BP during the day Call back [phone]

## 2019-11-05 ENCOUNTER — Other Ambulatory Visit: Payer: Self-pay

## 2019-11-05 ENCOUNTER — Ambulatory Visit: Payer: Self-pay | Admitting: General Practice

## 2019-11-05 ENCOUNTER — Encounter: Payer: Self-pay | Admitting: Family Medicine

## 2019-11-05 ENCOUNTER — Ambulatory Visit (INDEPENDENT_AMBULATORY_CARE_PROVIDER_SITE_OTHER): Payer: Medicare HMO | Admitting: Family Medicine

## 2019-11-05 ENCOUNTER — Telehealth: Payer: Self-pay | Admitting: General Surgery

## 2019-11-05 VITALS — BP 140/71 | HR 68 | Temp 98.4°F | Resp 18 | Ht 66.0 in | Wt 155.2 lb

## 2019-11-05 DIAGNOSIS — I10 Essential (primary) hypertension: Secondary | ICD-10-CM

## 2019-11-05 DIAGNOSIS — K420 Umbilical hernia with obstruction, without gangrene: Secondary | ICD-10-CM | POA: Diagnosis not present

## 2019-11-05 DIAGNOSIS — K449 Diaphragmatic hernia without obstruction or gangrene: Secondary | ICD-10-CM

## 2019-11-05 DIAGNOSIS — F319 Bipolar disorder, unspecified: Secondary | ICD-10-CM

## 2019-11-05 DIAGNOSIS — Z09 Encounter for follow-up examination after completed treatment for conditions other than malignant neoplasm: Secondary | ICD-10-CM

## 2019-11-05 DIAGNOSIS — F418 Other specified anxiety disorders: Secondary | ICD-10-CM

## 2019-11-05 MED ORDER — LISINOPRIL 5 MG PO TABS: 5.0000 mg | ORAL_TABLET | Freq: Every day | ORAL | 3 refills | Status: DC

## 2019-11-05 NOTE — Assessment & Plan Note (Signed)
Umbilical hernia repair on 10/31/2019 with Dr. Duanne Guess.  Reports is doing well, passing stool, without nausea or vomiting.  Has increased her fluid intake.  Is taking prescriptions as prescribed, letting water run over her steri strips and patting dry.  Has follow up visit scheduled with Dr. America Brown office on 11/20/2019.  Plan: 1. Continue increased fluids to avoid constipation 2. Continue all medications as prescribed 3. Keep post-operative appointment with Dr. America Brown office on 11/20/2019 4. RTC as needed

## 2019-11-05 NOTE — Progress Notes (Signed)
Subjective:    Patient ID: Katrina Sutton, female    DOB: 1950-08-13, 69 y.o.   MRN: [id number]  Katrina Sutton is a 69 y.o. female presenting on 11/05/2019 for Hospitalization Follow-up (Intestinal obstruction due to recurrent umbilical hernia, HERNIA REPAIR UMBILICAL ADULT on 10/31/2019) and Hypotension (pt concern about some intermittent low blood pressure post surgery  98/50, 85/50. She state that she feels lightheadedness, dizziness when you have the low blood pressure reading. )   HPI  HOSPITAL FOLLOW-UP VISIT  Hospital/Location: [facility] Date of Admission: 10/26/2019 Date of Discharge: 11/01/2019 Transitions of care telephone call: 11/02/2019  Reason for Admission: Abdominal pain Primary (+Secondary) Diagnosis: SBO due to umbilical hernia, large hiatal hernia  - Hospital H&P and Discharge Summary have been reviewed - Patient presents today 4 days after recent hospitalization. Brief summary of recent course, patient had symptoms of abdominal pain with SBO due to umbilical hernia, hospitalized, had umbilical hernia surgery. - Today reports overall has done well after discharge. Symptoms of abdominal pain are much improved. - New medications on discharge: oxycodone  IR, 1 tablet by mouth every 6 hours as needed for up to 5 days for severe pain - Changes to current meds on discharge: None  I have reviewed the discharge medication list, and have reconciled the current and discharge medications today.   Current Outpatient Medications:  .  acetaminophen (TYLENOL) 500 MG tablet, Take 500 mg by mouth every 8 (eight) hours as needed for headache., Disp: , Rfl:  .  clonazePAM (KLONOPIN) 1 MG tablet, TAKE 1 TABLET BY MOUTH TWICE A DAY AS NEEDED ANXIETY, Disp: 60 tablet, Rfl: 1 .  fluticasone (FLONASE) 50 MCG/ACT nasal spray, Place 1 spray into both nostrils daily as needed., Disp: , Rfl:  .  hydrocortisone cream 1 %, Apply 1 application topically 2 (two) times daily as needed for itching. ,  Disp: , Rfl:  .  linaclotide (LINZESS) 145 MCG CAPS capsule, Take 1 capsule (145 mcg total) by mouth daily before breakfast., Disp: 90 capsule, Rfl: 1 .  omeprazole (PRILOSEC) 40 MG capsule, Take 1 capsule (40 mg total) by mouth daily. In morning, Disp: 90 capsule, Rfl: 1 .  oxyCODONE (OXY IR/ROXICODONE) 5 MG immediate release tablet, Take 1 tablet (5 mg total) by mouth every 6 (six) hours as needed for up to 5 days for severe pain or breakthrough pain., Disp: 10 tablet, Rfl: 0 .  PARoxetine (PAXIL) 40 MG tablet, TAKE 1 TABLET BY MOUTH DAILY, Disp: 90 tablet, Rfl: 0 .  polyethylene glycol (MIRALAX / GLYCOLAX) 17 g packet, Take 17 g by mouth daily., Disp: , Rfl: 0 .  sodium chloride (OCEAN) 0.65 % SOLN nasal spray, Place 1 spray into both nostrils as needed for congestion., Disp: , Rfl:  .  lisinopril (ZESTRIL) 5 MG tablet, Take 1 tablet (5 mg total) by mouth daily., Disp: 90 tablet, Rfl: 3  ------------------------------------------------------------------------- Social History   Tobacco Use  . Smoking status: Former Smoker    Quit date: 12/27/1976    Years since quitting: 42.8  . Smokeless tobacco: Never Used  Vaping Use  . Vaping Use: Never used  Substance Use Topics  . Alcohol use: No  . Drug use: No    Review of Systems  Constitutional: Negative.   HENT: Negative.   Eyes: Negative.   Respiratory: Negative.   Cardiovascular: Negative.   Gastrointestinal: Positive for abdominal pain. Negative for abdominal distention, anal bleeding, blood in stool, constipation, diarrhea, nausea, rectal pain and  vomiting.  Endocrine: Negative.   Genitourinary: Negative.   Musculoskeletal: Negative.   Skin: Negative.   Allergic/Immunologic: Negative.   Neurological: Negative.   Hematological: Negative.   Psychiatric/Behavioral: Negative.    Per HPI unless specifically indicated above     Objective:    BP 140/71 (BP Location: Right Arm, Patient Position: Sitting, Cuff Size: Normal)    Pulse 68   Temp 98.4 F (36.9 C) (Oral)   Resp 18   Ht 5\' 6"  (1.676 m)   Wt 155 lb 3.2 oz (70.4 kg)   SpO2 96%   BMI 25.05 kg/m   Wt Readings from Last 3 Encounters:  11/05/19 155 lb 3.2 oz (70.4 kg)  10/27/19 164 lb 7.4 oz (74.6 kg)  10/25/19 163 lb 9.6 oz (74.2 kg)    Physical Exam Vitals reviewed.  Constitutional:      General: She is not in acute distress.    Appearance: Normal appearance. She is well-developed, well-groomed and overweight. She is not ill-appearing or toxic-appearing.  HENT:     Head: Normocephalic and atraumatic.     Nose:     Comments: 12/25/19 is in place, covering mouth and nose. Eyes:     General: Lids are normal. Vision grossly intact.        Right eye: No discharge.        Left eye: No discharge.     Extraocular Movements: Extraocular movements intact.     Conjunctiva/sclera: Conjunctivae normal.     Pupils: Pupils are equal, round, and reactive to light.  Cardiovascular:     Rate and Rhythm: Normal rate and regular rhythm.     Pulses: Normal pulses.          Dorsalis pedis pulses are 2+ on the right side and 2+ on the left side.     Heart sounds: Normal heart sounds. No murmur heard.  No friction rub. No gallop.   Pulmonary:     Effort: Pulmonary effort is normal. No respiratory distress.     Breath sounds: Normal breath sounds.  Abdominal:     General: Abdomen is flat. Bowel sounds are normal. There is no distension.     Palpations: Abdomen is soft. There is no mass.     Tenderness: There is no abdominal tenderness. There is no guarding or rebound.    Musculoskeletal:     Right lower leg: No edema.     Left lower leg: No edema.  Skin:    General: Skin is warm and dry.     Capillary Refill: Capillary refill takes less than 2 seconds.  Neurological:     General: No focal deficit present.     Mental Status: She is alert and oriented to person, place, and time.  Psychiatric:        Attention and Perception: Attention and perception  normal.        Mood and Affect: Mood and affect normal.        Speech: Speech normal.        Behavior: Behavior normal. Behavior is cooperative.        Thought Content: Thought content normal.        Cognition and Memory: Cognition and memory normal.        Judgment: Judgment normal.     Results for orders placed or performed during the hospital encounter of 10/26/19  SARS Coronavirus 2 by RT PCR (hospital order, performed in [facility] hospital lab) Nasopharyngeal Nasopharyngeal Swab   Specimen: Nasopharyngeal Swab  Result Value Ref Range   SARS Coronavirus 2 NEGATIVE NEGATIVE  Surgical pcr screen   Specimen: Nasal Mucosa; Nasal Swab  Result Value Ref Range   MRSA, PCR NEGATIVE NEGATIVE   Staphylococcus aureus NEGATIVE NEGATIVE  CBC with Differential  Result Value Ref Range   WBC 22.2 (H) 4.0 - 10.5 K/uL   RBC 5.31 (H) 3.87 - 5.11 MIL/uL   Hemoglobin 16.4 (H) 12.0 - 15.0 g/dL   HCT 16.1 (H) 36 - 46 %   MCV 90.4 80.0 - 100.0 fL   MCH 30.9 26.0 - 34.0 pg   MCHC 34.2 30.0 - 36.0 g/dL   RDW 09.6 04.5 - 40.9 %   Platelets 442 (H) 150 - 400 K/uL   nRBC 0.0 0.0 - 0.2 %   Neutrophils Relative % 85 %   Neutro Abs 18.9 (H) 1.7 - 7.7 K/uL   Lymphocytes Relative 9 %   Lymphs Abs 1.9 0.7 - 4.0 K/uL   Monocytes Relative 5 %   Monocytes Absolute 1.1 (H) 0 - 1 K/uL   Eosinophils Relative 0 %   Eosinophils Absolute 0.0 0 - 0 K/uL   Basophils Relative 0 %   Basophils Absolute 0.1 0 - 0 K/uL   Immature Granulocytes 1 %   Abs Immature Granulocytes 0.11 (H) 0.00 - 0.07 K/uL  Comprehensive metabolic panel  Result Value Ref Range   Sodium 136 135 - 145 mmol/L   Potassium 3.1 (L) 3.5 - 5.1 mmol/L   Chloride 97 (L) 98 - 111 mmol/L   CO2 24 22 - 32 mmol/L   Glucose, Bld 200 (H) 70 - 99 mg/dL   BUN 7 (L) 8 - 23 mg/dL   Creatinine, Ser 8.11 (H) 0.44 - 1.00 mg/dL   Calcium 91.4 8.9 - 78.2 mg/dL   Total Protein 8.2 (H) 6.5 - 8.1 g/dL   Albumin 4.7 3.5 - 5.0 g/dL   AST 31 15 - 41 U/L   ALT  27 0 - 44 U/L   Alkaline Phosphatase 144 (H) 38 - 126 U/L   Total Bilirubin 0.9 0.3 - 1.2 mg/dL   GFR calc non Af Amer 37 (L) >60 mL/min   GFR calc Af Amer 43 (L) >60 mL/min   Anion gap 15 5 - 15  Lactic acid, plasma  Result Value Ref Range   Lactic Acid, Venous 1.7 0.5 - 1.9 mmol/L  Lactic acid, plasma  Result Value Ref Range   Lactic Acid, Venous 0.9 0.5 - 1.9 mmol/L  Lipase, blood  Result Value Ref Range   Lipase 74 (H) 11 - 51 U/L  Comprehensive metabolic panel  Result Value Ref Range   Sodium 140 135 - 145 mmol/L   Potassium 3.0 (L) 3.5 - 5.1 mmol/L   Chloride 103 98 - 111 mmol/L   CO2 25 22 - 32 mmol/L   Glucose, Bld 135 (H) 70 - 99 mg/dL   BUN 7 (L) 8 - 23 mg/dL   Creatinine, Ser 9.56 (H) 0.44 - 1.00 mg/dL   Calcium 8.8 (L) 8.9 - 10.3 mg/dL   Total Protein 6.8 6.5 - 8.1 g/dL   Albumin 3.8 3.5 - 5.0 g/dL   AST 35 15 - 41 U/L   ALT 29 0 - 44 U/L   Alkaline Phosphatase 128 (H) 38 - 126 U/L   Total Bilirubin 0.9 0.3 - 1.2 mg/dL   GFR calc non Af Amer 47 (L) >60 mL/min   GFR calc Af Amer 55 (L) >60 mL/min  Anion gap 12 5 - 15  CBC  Result Value Ref Range   WBC 20.6 (H) 4.0 - 10.5 K/uL   RBC 4.77 3.87 - 5.11 MIL/uL   Hemoglobin 14.7 12.0 - 15.0 g/dL   HCT 16.142.2 36 - 46 %   MCV 88.5 80.0 - 100.0 fL   MCH 30.8 26.0 - 34.0 pg   MCHC 34.8 30.0 - 36.0 g/dL   RDW 09.613.2 04.511.5 - 40.915.5 %   Platelets 319 150 - 400 K/uL   nRBC 0.0 0.0 - 0.2 %  CBC with Differential/Platelet  Result Value Ref Range   WBC 10.2 4.0 - 10.5 K/uL   RBC 4.05 3.87 - 5.11 MIL/uL   Hemoglobin 12.2 12.0 - 15.0 g/dL   HCT 81.137.8 36 - 46 %   MCV 93.3 80.0 - 100.0 fL   MCH 30.1 26.0 - 34.0 pg   MCHC 32.3 30.0 - 36.0 g/dL   RDW 91.413.5 78.211.5 - 95.615.5 %   Platelets 263 150 - 400 K/uL   nRBC 0.0 0.0 - 0.2 %   Neutrophils Relative % 80 %   Neutro Abs 8.2 (H) 1.7 - 7.7 K/uL   Lymphocytes Relative 11 %   Lymphs Abs 1.1 0.7 - 4.0 K/uL   Monocytes Relative 8 %   Monocytes Absolute 0.8 0 - 1 K/uL   Eosinophils  Relative 1 %   Eosinophils Absolute 0.1 0 - 0 K/uL   Basophils Relative 0 %   Basophils Absolute 0.0 0 - 0 K/uL   Immature Granulocytes 0 %   Abs Immature Granulocytes 0.02 0.00 - 0.07 K/uL  Basic metabolic panel  Result Value Ref Range   Sodium 146 (H) 135 - 145 mmol/L   Potassium 3.3 (L) 3.5 - 5.1 mmol/L   Chloride 108 98 - 111 mmol/L   CO2 28 22 - 32 mmol/L   Glucose, Bld 113 (H) 70 - 99 mg/dL   BUN 7 (L) 8 - 23 mg/dL   Creatinine, Ser 2.130.94 0.44 - 1.00 mg/dL   Calcium 8.6 (L) 8.9 - 10.3 mg/dL   GFR calc non Af Amer >60 >60 mL/min   GFR calc Af Amer >60 >60 mL/min   Anion gap 10 5 - 15  Magnesium  Result Value Ref Range   Magnesium 1.7 1.7 - 2.4 mg/dL  Phosphorus  Result Value Ref Range   Phosphorus 2.6 2.5 - 4.6 mg/dL  CBC with Differential/Platelet  Result Value Ref Range   WBC 12.5 (H) 4.0 - 10.5 K/uL   RBC 4.40 3.87 - 5.11 MIL/uL   Hemoglobin 13.5 12.0 - 15.0 g/dL   HCT 08.641.2 36 - 46 %   MCV 93.6 80.0 - 100.0 fL   MCH 30.7 26.0 - 34.0 pg   MCHC 32.8 30.0 - 36.0 g/dL   RDW 57.813.2 46.911.5 - 62.915.5 %   Platelets 276 150 - 400 K/uL   nRBC 0.0 0.0 - 0.2 %   Neutrophils Relative % 81 %   Neutro Abs 10.0 (H) 1.7 - 7.7 K/uL   Lymphocytes Relative 11 %   Lymphs Abs 1.4 0.7 - 4.0 K/uL   Monocytes Relative 7 %   Monocytes Absolute 0.9 0 - 1 K/uL   Eosinophils Relative 1 %   Eosinophils Absolute 0.1 0 - 0 K/uL   Basophils Relative 0 %   Basophils Absolute 0.0 0 - 0 K/uL   Immature Granulocytes 0 %   Abs Immature Granulocytes 0.04 0.00 - 0.07 K/uL  Basic metabolic  panel  Result Value Ref Range   Sodium 143 135 - 145 mmol/L   Potassium 4.2 3.5 - 5.1 mmol/L   Chloride 107 98 - 111 mmol/L   CO2 25 22 - 32 mmol/L   Glucose, Bld 122 (H) 70 - 99 mg/dL   BUN 6 (L) 8 - 23 mg/dL   Creatinine, Ser 8.41 0.44 - 1.00 mg/dL   Calcium 9.0 8.9 - 32.4 mg/dL   GFR calc non Af Amer >60 >60 mL/min   GFR calc Af Amer >60 >60 mL/min   Anion gap 11 5 - 15  Magnesium  Result Value Ref Range    Magnesium 1.7 1.7 - 2.4 mg/dL  Basic metabolic panel  Result Value Ref Range   Sodium 140 135 - 145 mmol/L   Potassium 3.8 3.5 - 5.1 mmol/L   Chloride 105 98 - 111 mmol/L   CO2 26 22 - 32 mmol/L   Glucose, Bld 114 (H) 70 - 99 mg/dL   BUN 6 (L) 8 - 23 mg/dL   Creatinine, Ser 4.01 0.44 - 1.00 mg/dL   Calcium 8.9 8.9 - 02.7 mg/dL   GFR calc non Af Amer >60 >60 mL/min   GFR calc Af Amer >60 >60 mL/min   Anion gap 9 5 - 15  Magnesium  Result Value Ref Range   Magnesium 1.5 (L) 1.7 - 2.4 mg/dL  Magnesium  Result Value Ref Range   Magnesium 2.3 1.7 - 2.4 mg/dL  Basic metabolic panel  Result Value Ref Range   Sodium 141 135 - 145 mmol/L   Potassium 3.8 3.5 - 5.1 mmol/L   Chloride 105 98 - 111 mmol/L   CO2 24 22 - 32 mmol/L   Glucose, Bld 86 70 - 99 mg/dL   BUN 8 8 - 23 mg/dL   Creatinine, Ser 2.53 0.44 - 1.00 mg/dL   Calcium 9.4 8.9 - 66.4 mg/dL   GFR calc non Af Amer >60 >60 mL/min   GFR calc Af Amer >60 >60 mL/min   Anion gap 12 5 - 15  CBC  Result Value Ref Range   WBC 7.7 4.0 - 10.5 K/uL   RBC 4.57 3.87 - 5.11 MIL/uL   Hemoglobin 14.0 12.0 - 15.0 g/dL   HCT 40.3 36 - 46 %   MCV 90.8 80.0 - 100.0 fL   MCH 30.6 26.0 - 34.0 pg   MCHC 33.7 30.0 - 36.0 g/dL   RDW 47.4 25.9 - 56.3 %   Platelets 304 150 - 400 K/uL   nRBC 0.0 0.0 - 0.2 %  Basic metabolic panel  Result Value Ref Range   Sodium 142 135 - 145 mmol/L   Potassium 3.9 3.5 - 5.1 mmol/L   Chloride 106 98 - 111 mmol/L   CO2 26 22 - 32 mmol/L   Glucose, Bld 74 70 - 99 mg/dL   BUN 11 8 - 23 mg/dL   Creatinine, Ser 8.75 0.44 - 1.00 mg/dL   Calcium 9.1 8.9 - 64.3 mg/dL   GFR calc non Af Amer >60 >60 mL/min   GFR calc Af Amer >60 >60 mL/min   Anion gap 10 5 - 15  CBC  Result Value Ref Range   WBC 12.0 (H) 4.0 - 10.5 K/uL   RBC 4.61 3.87 - 5.11 MIL/uL   Hemoglobin 14.3 12.0 - 15.0 g/dL   HCT 32.9 36 - 46 %   MCV 89.2 80.0 - 100.0 fL   MCH 31.0 26.0 - 34.0 pg  MCHC 34.8 30.0 - 36.0 g/dL   RDW 78.4 69.6 -  29.5 %   Platelets 295 150 - 400 K/uL   nRBC 0.0 0.0 - 0.2 %  Magnesium  Result Value Ref Range   Magnesium 2.0 1.7 - 2.4 mg/dL      Assessment & Plan:   Problem List Items Addressed This Visit      Cardiovascular and Mediastinum   Hypertension    Patient reports over controlled hypertension when at home, having readings of 90's/50's and feeling dizzy/symptomatic.  In clinic BP is above goal of 130/80. Discussed we can decrease to lisinopril 5mg  daily and have her recheck her blood pressure twice per day and follow up in 4 weeks for re-evaluation Complications: Overweight, paraseophageal hernia, GERD  Plan: 1. BEGIN taking lisinopril 5mg .  STOP lisinopril 10mg  2. Obtain labs in 6 months 3. Encouraged heart healthy diet and increasing exercise to 30 minutes most days of the week, going no more than 2 days in a row without exercise. 4. Check BP 1-2 x per week at home, keep log, and bring to clinic at next appointment. 5. Follow up 4 weeks.          Relevant Medications   lisinopril (ZESTRIL) 5 MG tablet     Other   Umbilical hernia    Umbilical hernia repair on 10/31/2019 with Dr. .  Reports is doing well, passing stool, without nausea or vomiting.  Has increased her fluid intake.  Is taking prescriptions as prescribed, letting water run over her steri strips and patting dry.  Has follow up visit scheduled with Dr. office on 11/20/2019.  Plan: 1. Continue increased fluids to avoid constipation 2. Continue all medications as prescribed 3. Keep post-operative appointment with Dr. Duanne Guess office on 11/20/2019 4. RTC as needed      Hospital discharge follow-up - Primary    Has all prescriptions that have been prescribed through the pharmacy and no acute concerns today.  See umbilical hernia A/P          Meds ordered this encounter  Medications  . lisinopril (ZESTRIL) 5 MG tablet    Sig: Take 1 tablet (5 mg total) by mouth daily.    Dispense:   90 tablet    Refill:  3    Replaces 10mg  prescription    Follow up plan: Return in about 4 weeks (around 12/03/2019) for HTN f/u.  America Brown, FNP-C Family Nurse Practitioner [facility] 11/05/2019, 12:46 PM

## 2019-11-05 NOTE — Assessment & Plan Note (Signed)
Has all prescriptions that have been prescribed through the pharmacy and no acute concerns today.  See umbilical hernia A/P

## 2019-11-05 NOTE — Patient Instructions (Signed)
As we discussed, I have decreased your lisinopril from 10mg  daily to 5mg  daily.  Continue to take your blood pressure twice per day, if you are having readings over 130/80 or are having low blood pressure readings and dizziness, to please let know before your next appointment.  Try to get exercise a minimum of 30 minutes per day at least 5 days per week as well as  adequate water intake all while measuring blood pressure a few times per week.  Keep a blood pressure log and bring back to clinic at your next visit.  If your readings are consistently over 130/80 to contact our office/send me a MyChart message and we will see you sooner.  Can try DASH and Mediterranean diet options, avoiding processed foods, lowering sodium intake, avoiding pork products, and eating a plant based diet for optimal health.  Education and discussion with patient regarding hypertension as well as the effects on the organs and body.  Specifically, we spoke about kidney disease, kidney failure, heart attack, stroke and up to and including death, as likely outcomes if non-compliant with blood pressure regulation.  Discussed how all of these habits are attached to each other and each has the effect on each other.  We will plan to see you back in 4 weeks for hypertension follow up visit  You will receive a survey after today's visit either digitally by e-mail or paper by USPS mail. Your experiences and feedback matter to .  Please respond so we know how we are doing as we provide care for you.  Call us with any questions/concerns/needs.  It is my goal to be available to you for your health concerns.  Thanks for choosing me to be a partner in your healthcare needs!  [place], FNP-C Family Nurse Practitioner [facility] Phone: [phone]

## 2019-11-05 NOTE — Patient Instructions (Signed)
Visit Information  Goals Addressed              This Visit's Progress   .  RNCM-Pt-"I am taking a new medication for my blood pressure" (pt-stated)        CARE PLAN ENTRY (see longtitudinal plan of care for additional care plan information)  Objective:  . Last practice recorded BP readings:  . BP Readings from Last 3 Encounters: .  11/05/19 . 140/71 .  11/01/19 . 134/71 .  10/25/19 . 130/68   . Most recent eGFR/CrCl: No results found for: EGFR  No components found for: CRCL  Current Barriers:  Marland Kitchen Knowledge Deficits related to basic understanding of hypertension pathophysiology and self care management . Knowledge Deficits related to understanding of medications prescribed for management of hypertension . Limited Social Support  Case Manager Clinical Goal(s):  Marland Kitchen Over the next 120 days, patient will verbalize understanding of plan for hypertension management . Over the next 120 days, patient will attend all scheduled medical appointments: next appointment with the pcp today 11-05-2019, next appointment on 12-03-2019 with the pcp. . Over the next 120 days, patient will demonstrate improved adherence to prescribed treatment plan for hypertension as evidenced by taking all medications as prescribed, monitoring and recording blood pressure as directed, adhering to low sodium/DASH diet . Over the next 120 days, patient will demonstrate improved health management independence as evidenced by checking blood pressure as directed and notifying PCP if SBP>130 or DBP > 80, taking all medications as prescribe, and adhering to a low sodium diet as discussed. o Recent blood pressure readings given to the [facility] by the patient: - 118/88- 08-31-2019 - 104/75, p-90-6-19-2021 - 103/69, p-81 09-05-2019 . Over the next 120 days, patient will verbalize basic understanding of hypertension disease process and self health management plan as evidenced by blood pressure controlled and no side effects noted from  blood pressure regimen prescribed.   Interventions:  Marland Kitchen UNABLE to independently:manage HTN . Evaluation of current treatment plan related to hypertension self management and patient's adherence to plan as established by provider. . Provided education to patient re: stroke prevention, s/s of heart attack and stroke, DASH diet, complications of uncontrolled blood pressure . Reviewed medications with patient and discussed importance of compliance. Education provided on the side effects of Lisinopril and to take medication as prescribed. The patient verbalized understanding. The patient also ask about a refill for her Klonopin. The patient states the pharmacy does not have the refill yet. Education provided that a message will be sent to the pcp for refill. 11-05-2019: The patient in the office today and Lisinopril decreased from 10 mg to 5 mg. The patient is checking blood pressures at home and recording. The patient states that she is feeling great post surgery for hernia repair. Parameters have been given by the pcp and the patient verbalized understanding.  . Discussed plans with patient for ongoing care management follow up and provided patient with direct contact information for care management team . Advised patient, providing education and rationale, to monitor blood pressure daily and record, calling PCP for findings outside established parameters. The patient endorses compliance with taking blood pressures and recording.  . Reviewed scheduled/upcoming provider appointments including: 11-05-2019 with pcp.  Next appointment with the pcp on 12-03-2019 at 1:40 pm  Patient Self Care Activities:  . UNABLE to independently manage HTN  Please see past updates related to this goal by clicking on the "Past Updates" button in the selected goal       .  RNCM: PT-"I am doing the best I can managing my care" (pt-stated)        CARE PLAN ENTRY (see longtitudinal plan of care for additional care plan  information)  Current Barriers:  . Chronic Disease Management support, education, and care coordination needs related to Anxiety, Depression, Bipolar Disorder, and Paraesophageal hernia   Clinical Goal(s) related to Anxiety, Depression, Bipolar Disorder, and Paraesophageal hernia :  Over the next 120 days, patient will:  . Work with the care management team to address educational, disease management, and care coordination needs  . Begin or continue self health monitoring activities as directed today  follow a soft diet, sit up straight to prevent choking hazard, monitor for s/s of dehydration . Call provider office for new or worsened signs and symptoms Oxygen saturation lower than established parameter, Shortness of breath, and New or worsened symptom related to Anxiety/depression/bipolar disorder or complications from Paraesophageal hernia as evidence by recent hospitalization due to hypoxia  . Call care management team with questions or concerns . Verbalize basic understanding of patient centered plan of care established today  Interventions related to Anxiety, Depression, Bipolar Disorder, and Paraesophageal hernia :  . Evaluation of current treatment plans and patient's adherence to plan as established by provider- 11-05-2019: The patient seen in the office today by the pcp and RNCM for post discharge appointment and review of chronic conditions. The patient had umbilical hernia repair on 8-18 and is recovering well. She states she is taking Tylenol for pain. Has stronger medication if needed but has not had to take anything stronger. The patient verbalized understanding of discharge instructions and how to care for surgical site. The patient follows up with the surgeon on 11-20-2019.  . Assessed patient understanding of disease states- 11-05-2019: The patient is doing well post surgical hernia repair. The patient verbalized she had been really sick prior to hospitalization but is feeling so much  better. Site healing well. Sees surgeon for follow up in September. States some "runny" bowel movements. Education on possible changes in bowel habits post anesthesia events. The patient is following the recommendations of the provider.  . Assessed patient's education and care coordination needs.  Education on what worse looks like and to call for changes in condition. The patient verbalized that she is doing well and is happy that she has had the surgery to repair the umbilical hernia.  . Provided disease specific education to patient- education on eating foods that are soft and easy to swallow, education on sitting straight up when eating to prevent choking, education on staying hydrated and monitor for s/s of dehydration. The patient is drinking "plenty of water", also she is drinking gatorade and Ensure at times. The patient states she is being careful when eating. The patient is managing her symptoms well during the day with positive changes in her diet. 11-05-2019: Ongoing support and review. Nash Dimmer with appropriate clinical care team members regarding patient needs: Ongoing support from LCSW for stress, anxiety, and depression. Pharmacy consult for recommendations and help with clarification of omeprazole dosage and recommendations.  11-05-2019- The patient continues to work with LCSW and pharmacist. Knows the CCM team is here to help with her health and wellness needs.  . Evaluation of depression and anxiety: The patient accompanied by her husband. The patient verbalized that she is feeling so much better and is happy to be home from the hospital.  Feels this was a much needed surgery and is happy with the results.Denies any discomfort.  The patient encouraged to cough and deep breath to prevent any difficulties with pneumonia post hospitalization.  The patient is thankful for the support of the team.  Will continue to follow for changes and needs.   Patient Self Care Activities related to  Anxiety, Depression, Bipolar Disorder, and Paraesophageal hernia :  . Patient is unable to independently self-manage chronic health conditions  Please see past updates related to this goal by clicking on the "Past Updates" button in the selected goal         Patient verbalizes understanding of instructions provided today.   Face to Face appointment with care management team member scheduled for:  12-03-2019 at 140 pm  .Noreene Larsson RN, MSN, [place] Mobile: [phone]

## 2019-11-05 NOTE — Telephone Encounter (Signed)
Patient notified she may drive when she is pain free and no longer taking any narcotic pain medication. She reports just using Tylenol if needed and no pain.

## 2019-11-05 NOTE — Chronic Care Management (AMB) (Signed)
Chronic Care Management   Follow Up Note   11/05/2019 Name: Emelyn GERILYN STARGELL MRN: [id number] DOB: [birth year]  Referred by: Verl Bangs, FNP Reason for referral : Chronic Care Management (In person Follow up visit- for post hospital discharge and Chronic Disease Management and Care Coordination Needs)   Novalynn T Kivi is a 69 y.o. year old female who is a primary care patient of Verl Bangs, FNP. The CCM team was consulted for assistance with chronic disease management and care coordination needs.    Review of patient status, including review of consultants reports, relevant laboratory and other test results, and collaboration with appropriate care team members and the patient's provider was performed as part of comprehensive patient evaluation and provision of chronic care management services.    SDOH (Social Determinants of Health) assessments performed: Yes See Care Plan activities for detailed interventions related to [facility])     Outpatient Encounter Medications as of 11/05/2019  Medication Sig Note   acetaminophen (TYLENOL) 500 MG tablet Take 500 mg by mouth every 8 (eight) hours as needed for headache.    clonazePAM (KLONOPIN) 1 MG tablet TAKE 1 TABLET BY MOUTH TWICE A DAY AS NEEDED ANXIETY    fluticasone (FLONASE) 50 MCG/ACT nasal spray Place 1 spray into both nostrils daily as needed.    hydrocortisone cream 1 % Apply 1 application topically 2 (two) times daily as needed for itching.  06/19/2019: Very seldom    linaclotide (LINZESS) 145 MCG CAPS capsule Take 1 capsule (145 mcg total) by mouth daily before breakfast.    lisinopril (ZESTRIL) 5 MG tablet Take 1 tablet (5 mg total) by mouth daily.    omeprazole (PRILOSEC) 40 MG capsule Take 1 capsule (40 mg total) by mouth daily. In morning    oxyCODONE (OXY IR/ROXICODONE) 5 MG immediate release tablet Take 1 tablet (5 mg total) by mouth every 6 (six) hours as needed for up to 5 days for severe pain or breakthrough pain.     PARoxetine (PAXIL) 40 MG tablet TAKE 1 TABLET BY MOUTH DAILY    polyethylene glycol (MIRALAX / GLYCOLAX) 17 g packet Take 17 g by mouth daily.    sodium chloride (OCEAN) 0.65 % SOLN nasal spray Place 1 spray into both nostrils as needed for congestion.    No facility-administered encounter medications on file as of 11/05/2019.     Objective:   Goals Addressed              This Visit's Progress     RNCM-Pt-"I am taking a new medication for my blood pressure" (pt-stated)        CARE PLAN ENTRY (see longtitudinal plan of care for additional care plan information)  Objective:   Last practice recorded BP readings:   BP Readings from Last 3 Encounters:   11/05/19  140/71   11/01/19  134/71   10/25/19  130/68    Most recent eGFR/CrCl: No results found for: EGFR  No components found for: CRCL  Current Barriers:   Knowledge Deficits related to basic understanding of hypertension pathophysiology and self care management  Knowledge Deficits related to understanding of medications prescribed for management of hypertension  Limited Social Support  Case Manager Clinical Goal(s):   Over the next 120 days, patient will verbalize understanding of plan for hypertension management  Over the next 120 days, patient will attend all scheduled medical appointments: next appointment with the pcp today 11-05-2019, next appointment on 12-03-2019 with the pcp.  Over the next  120 days, patient will demonstrate improved adherence to prescribed treatment plan for hypertension as evidenced by taking all medications as prescribed, monitoring and recording blood pressure as directed, adhering to low sodium/DASH diet  Over the next 120 days, patient will demonstrate improved health management independence as evidenced by checking blood pressure as directed and notifying PCP if SBP>130 or DBP > 80, taking all medications as prescribe, and adhering to a low sodium diet as discussed. o Recent blood  pressure readings given to the [facility] by the patient: - 118/88- 08-31-2019 - 104/75, p-90-6-19-2021 - 103/69, p-81 09-05-2019  Over the next 120 days, patient will verbalize basic understanding of hypertension disease process and self health management plan as evidenced by blood pressure controlled and no side effects noted from blood pressure regimen prescribed.   Interventions:   UNABLE to independently:manage HTN  Evaluation of current treatment plan related to hypertension self management and patient's adherence to plan as established by provider.  Provided education to patient re: stroke prevention, s/s of heart attack and stroke, DASH diet, complications of uncontrolled blood pressure  Reviewed medications with patient and discussed importance of compliance. Education provided on the side effects of Lisinopril and to take medication as prescribed. The patient verbalized understanding. The patient also ask about a refill for her Klonopin. The patient states the pharmacy does not have the refill yet. Education provided that a message will be sent to the pcp for refill. 11-05-2019: The patient in the office today and Lisinopril decreased from 10 mg to 5 mg. The patient is checking blood pressures at home and recording. The patient states that she is feeling great post surgery for hernia repair. Parameters have been given by the pcp and the patient verbalized understanding.   Discussed plans with patient for ongoing care management follow up and provided patient with direct contact information for care management team  Advised patient, providing education and rationale, to monitor blood pressure daily and record, calling PCP for findings outside established parameters. The patient endorses compliance with taking blood pressures and recording.   Reviewed scheduled/upcoming provider appointments including: 11-05-2019 with pcp.  Next appointment with the pcp on 12-03-2019 at 1:40 pm  Patient Self Care  Activities:   UNABLE to independently manage HTN  Please see past updates related to this goal by clicking on the "Past Updates" button in the selected goal         RNCM: PT-"I am doing the best I can managing my care" (pt-stated)        [place] (see longtitudinal plan of care for additional care plan information)  Current Barriers:   Chronic Disease Management support, education, and care coordination needs related to Anxiety, Depression, Bipolar Disorder, and Paraesophageal hernia   Clinical Goal(s) related to Anxiety, Depression, Bipolar Disorder, and Paraesophageal hernia :  Over the next 120 days, patient will:   Work with the care management team to address educational, disease management, and care coordination needs   Begin or continue self health monitoring activities as directed today  follow a soft diet, sit up straight to prevent choking hazard, monitor for s/s of dehydration  Call provider office for new or worsened signs and symptoms Oxygen saturation lower than established parameter, Shortness of breath, and New or worsened symptom related to Anxiety/depression/bipolar disorder or complications from Paraesophageal hernia as evidence by recent hospitalization due to hypoxia   Call care management team with questions or concerns  Verbalize basic understanding of patient centered plan of care  established today  Interventions related to Anxiety, Depression, Bipolar Disorder, and Paraesophageal hernia :   Evaluation of current treatment plans and patient's adherence to plan as established by provider- 11-05-2019: The patient seen in the office today by the pcp and RNCM for post discharge appointment and review of chronic conditions. The patient had umbilical hernia repair on 8-18 and is recovering well. She states she is taking Tylenol for pain. Has stronger medication if needed but has not had to take anything stronger. The patient verbalized understanding of discharge  instructions and how to care for surgical site. The patient follows up with the surgeon on 11-20-2019.   Assessed patient understanding of disease states- 11-05-2019: The patient is doing well post surgical hernia repair. The patient verbalized she had been really sick prior to hospitalization but is feeling so much better. Site healing well. Sees surgeon for follow up in September. States some "runny" bowel movements. Education on possible changes in bowel habits post anesthesia events. The patient is following the recommendations of the provider.   Assessed patient's education and care coordination needs.  Education on what worse looks like and to call for changes in condition. The patient verbalized that she is doing well and is happy that she has had the surgery to repair the umbilical hernia.   Provided disease specific education to patient- education on eating foods that are soft and easy to swallow, education on sitting straight up when eating to prevent choking, education on staying hydrated and monitor for s/s of dehydration. The patient is drinking "plenty of water", also she is drinking gatorade and Ensure at times. The patient states she is being careful when eating. The patient is managing her symptoms well during the day with positive changes in her diet. 11-05-2019: Ongoing support and review.  Collaborated with appropriate clinical care team members regarding patient needs: Ongoing support from LCSW for stress, anxiety, and depression. Pharmacy consult for recommendations and help with clarification of omeprazole dosage and recommendations.  11-05-2019- The patient continues to work with LCSW and pharmacist. Knows the CCM team is here to help with her health and wellness needs.   Evaluation of depression and anxiety: The patient accompanied by her husband. The patient verbalized that she is feeling so much better and is happy to be home from the hospital.  Feels this was a much needed surgery  and is happy with the results.Denies any discomfort. The patient encouraged to cough and deep breath to prevent any difficulties with pneumonia post hospitalization.  The patient is thankful for the support of the team.  Will continue to follow for changes and needs.   Patient Self Care Activities related to Anxiety, Depression, Bipolar Disorder, and Paraesophageal hernia :   Patient is unable to independently self-manage chronic health conditions  Please see past updates related to this goal by clicking on the "Past Updates" button in the selected goal          Plan:   Face to Face appointment with care management team member scheduled for: 12-03-2019 at 1:40pm   Noreene Larsson RN, MSN, [place] Mobile: [phone]

## 2019-11-05 NOTE — Telephone Encounter (Signed)
Patient called and is asking when she will be able to drive again. Please call patient and advise.

## 2019-11-05 NOTE — Assessment & Plan Note (Signed)
Patient reports over controlled hypertension when at home, having readings of 90's/50's and feeling dizzy/symptomatic.  In clinic BP is above goal of 130/80. Discussed we can decrease to lisinopril 5mg  daily and have her recheck her blood pressure twice per day and follow up in 4 weeks for re-evaluation Complications: Overweight, paraseophageal hernia, GERD  Plan: 1. BEGIN taking lisinopril 5mg .  STOP lisinopril 10mg  2. Obtain labs in 6 months 3. Encouraged heart healthy diet and increasing exercise to 30 minutes most days of the week, going no more than 2 days in a row without exercise. 4. Check BP 1-2 x per week at home, keep log, and bring to clinic at next appointment. 5. Follow up 4 weeks.

## 2019-11-06 ENCOUNTER — Other Ambulatory Visit: Payer: Self-pay | Admitting: Family Medicine

## 2019-11-06 ENCOUNTER — Ambulatory Visit: Payer: Self-pay | Admitting: Family Medicine

## 2019-11-06 DIAGNOSIS — R110 Nausea: Secondary | ICD-10-CM

## 2019-11-06 MED ORDER — ONDANSETRON 4 MG PO TBDP: 4.0000 mg | ORAL_TABLET | Freq: Three times a day (TID) | ORAL | 0 refills | Status: DC | PRN

## 2019-11-06 NOTE — Telephone Encounter (Signed)
Pt reports "Mild" nausea, onset yesterday, no vomiting. States is able to eat and is staying hydrated. Umbilical hernia repair 10/31/19. Pt questioning if something should be called in for her. Home care advise given and assured pt NT would route to practice for PCPs review.  Please advise: [phone]  Reason for Disposition . Unexplained nausea  Answer Assessment - Initial Assessment Questions 1. NAUSEA SEVERITY: "How bad is the nausea?" (e.g., mild, moderate, severe; dehydration, weight loss)   - MILD: loss of appetite without change in eating habits   - MODERATE: decreased oral intake without significant weight loss, dehydration, or malnutrition   - SEVERE: inadequate caloric or fluid intake, significant weight loss, symptoms of dehydration     Mild 2. ONSET: "When did the nausea begin?"     yesterday 3. VOMITING: "Any vomiting?" If Yes, ask: "How many times today?"     No 4. RECURRENT SYMPTOM: "Have you had nausea before?" If Yes, ask: "When was the last time?" "What happened that time?"     No 5. CAUSE: "What do you think is causing the nausea?"     Unsure  Protocols used: NAUSEA-A-AH

## 2019-11-06 NOTE — Telephone Encounter (Signed)
Sent in Rx for ondansetron to her pharmacy on file

## 2019-11-06 NOTE — Telephone Encounter (Signed)
The patient was notified that Zofran was sent over to her pharmacy.

## 2019-11-07 ENCOUNTER — Telehealth: Payer: Self-pay

## 2019-11-07 NOTE — Telephone Encounter (Signed)
Copied from CRM [id number]. Topic: General - Other >> Nov 07, 2019  3:51 PM Tamela Oddi wrote: Reason for CRM: Patient called to report that her BP has been dropping and going up and down.  She stated that the doctor wanted her to call if there was a change.  CB# [phone]      The pt called to report that her blood pressure been dropping. She checked her bp on this morning it was 115/80. She said this afternoon she started to feel a little dizzy after walking and check her bp again and it was 98/70. She held off on taking her Lisinopril on today. Please advise

## 2019-11-08 ENCOUNTER — Ambulatory Visit (INDEPENDENT_AMBULATORY_CARE_PROVIDER_SITE_OTHER): Payer: Medicare HMO | Admitting: Licensed Clinical Social Worker

## 2019-11-08 ENCOUNTER — Telehealth: Payer: Self-pay | Admitting: *Deleted

## 2019-11-08 DIAGNOSIS — I10 Essential (primary) hypertension: Secondary | ICD-10-CM | POA: Diagnosis not present

## 2019-11-08 DIAGNOSIS — F319 Bipolar disorder, unspecified: Secondary | ICD-10-CM

## 2019-11-08 DIAGNOSIS — F418 Other specified anxiety disorders: Secondary | ICD-10-CM

## 2019-11-08 DIAGNOSIS — K219 Gastro-esophageal reflux disease without esophagitis: Secondary | ICD-10-CM

## 2019-11-08 DIAGNOSIS — F419 Anxiety disorder, unspecified: Secondary | ICD-10-CM

## 2019-11-08 NOTE — Telephone Encounter (Signed)
Spoke with the patient and she reports stinging and burning at her incision site for the past several days. She states that it seems to be getting worse. She does reports some light yellow-clear drainage from the incision.  The patient will come in to the office tomorrow to see our PA for exam.

## 2019-11-08 NOTE — Telephone Encounter (Signed)
The pt was notified of the recommendations. She verbalize understanding, no questions or concerns. 

## 2019-11-08 NOTE — Telephone Encounter (Signed)
Patient called and stated that she had surgery on 10/31/19 by Dr Lady Gary umbilical hernia repair. She stated that the incision site is burning. She has been applying ice pack every 20 minutes but she thinks the burning is getting worse. Please call and advise

## 2019-11-08 NOTE — Chronic Care Management (AMB) (Signed)
Chronic Care Management    Clinical Social Work Follow Up Note  11/08/2019 Name: Grayson FUSAE FLORIO MRN: [id number] DOB: 1951-01-28  Roselyn T Swayze is a 69 y.o. year old female who is a primary care patient of Tarri Fuller, FNP. The CCM team was consulted for assistance with Mental Health Counseling and Resources.   Review of patient status, including review of consultants reports, other relevant assessments, and collaboration with appropriate care team members and the patient's provider was performed as part of comprehensive patient evaluation and provision of chronic care management services.    SDOH (Social Determinants of Health) assessments performed: Yes    Outpatient Encounter Medications as of 11/08/2019  Medication Sig Note  . acetaminophen (TYLENOL) 500 MG tablet Take 500 mg by mouth every 8 (eight) hours as needed for headache.   . clonazePAM (KLONOPIN) 1 MG tablet TAKE 1 TABLET BY MOUTH TWICE A DAY AS NEEDED ANXIETY   . fluticasone (FLONASE) 50 MCG/ACT nasal spray Place 1 spray into both nostrils daily as needed.   . hydrocortisone cream 1 % Apply 1 application topically 2 (two) times daily as needed for itching.  06/19/2019: Very seldom   . linaclotide (LINZESS) 145 MCG CAPS capsule Take 1 capsule (145 mcg total) by mouth daily before breakfast.   . lisinopril (ZESTRIL) 5 MG tablet Take 1 tablet (5 mg total) by mouth daily.   Marland Kitchen omeprazole (PRILOSEC) 40 MG capsule Take 1 capsule (40 mg total) by mouth daily. In morning   . ondansetron (ZOFRAN-ODT) 4 MG disintegrating tablet Take 1 tablet (4 mg total) by mouth every 8 (eight) hours as needed for nausea or vomiting.   Marland Kitchen PARoxetine (PAXIL) 40 MG tablet TAKE 1 TABLET BY MOUTH DAILY   . polyethylene glycol (MIRALAX / GLYCOLAX) 17 g packet Take 17 g by mouth daily.   . sodium chloride (OCEAN) 0.65 % SOLN nasal spray Place 1 spray into both nostrils as needed for congestion.    No facility-administered encounter medications on file as of  11/08/2019.     Goals Addressed    .  SW- I would like to gain additional support to manage my anxiety better (pt-stated)        Current Barriers:  . Limited social support . Mental Health Concerns  . Social Isolation . Limited access to caregiver . Lacks knowledge of community resource: available mental health resources that she can utilize   Clinical Social Work Clinical Goal(s):  Marland Kitchen Over the next 120 days, client will work with SW to address concerns related to decreasing anxious symptoms by implementing additional mental health support and coping skills into her routine.   Interventions: . Patient interviewed and appropriate assessments performed . Provided mental health counseling with regard daily difficulty with depression/isolation/mobility/weakness/balance. LCSW used active and reflective listening and implemented appropriate interventions to help suppport patient and her emotional needs. Advised patient to implement deep breathing/grounding/meditation/self-care exercises into her daily routine to combat stressors. Education provided.  . Discussed plans with patient for ongoing care management follow up and provided patient with direct contact information for care management team . Advised patient to implement deep breathing exercises into her routine whenever anxiety/stress arise to help bring her back to the present state.  . Assisted patient/caregiver with obtaining information about health plan benefits . Provided education and assistance to client regarding Advanced Directives. . Provided education to patient/caregiver regarding level of care options. . Encouraged patient to consider [facility] (mental health provider) for long term  follow up and therapy/counseling. PCP made a referral in the past. LCSW provided patient with their contact information and encouraged her to contact them but patient declined services as she reports that her anxiety has  improved. . Patient reports improvement with her bowels. . Patient reports that she has a strong social support network of family and friends. . Patient shares that her stomach issues trigger when anxiety arises. She continues to take stool softeners when needed. LCSW provided coping skill education for anxiety management. Patient reports that her anxiety is more tolerable now but her depression gets to her at times. Depression management and coping skill education provided to patient today. Patient was receptive to education provided. . Patient reports that she has been implementing more socialization into her routine which has been helpful. Patient reports that she goes to her favorite  restaurant to meet her friends once or twice a month. LCSW encouraged patient to implement additional self-care and socialization activities in order to improve her mood as she often stays inside at home throughout the day. Patient reports that she will start to get out of the house more often to run errands and get vitamin D.  . Praise provided as patient has put in a lot of work by challenging her negative thinking. Patient shares that she is starting to speak up more for herself and asking for help when needed. Patient shares an example of this by asking her husband to help her with cleaning their home. Positive reinforcement provided.  . Patient denies any urgent concerns and reports that her SOB has improved. . Patient reports having a successful hernia replacement surgery. She stable transportation to follow up appointment for this on 11/20/19. Marland Kitchen Patient confirms that she is taking her medication as prescribed, eating more protein since her surgery and drinking more fluids in order to increase her overall self -care. Patient declines experiencing any recent nausea since medication was prescribed. Patient will take this medication when nausea symptoms arise.   Patient Self Care Activities:  . Attends all scheduled  provider appointments . Lacks social connections  Please see past updates related to this goal by clicking on the "Past Updates" button in the selected goal       Follow Up Plan: SW will follow up with patient by phone over the next 60-90 days  Dickie La, [employer], MSW, LCSW [facility]  [facility] [email or web address] Phone: [phone]

## 2019-11-08 NOTE — Telephone Encounter (Signed)
Let's have her keep her follow up appointment for 2 weeks from now and hold off on taking the lisinopril until I see her next.  Encourage her to increase her water intake as well.  Thanks

## 2019-11-09 ENCOUNTER — Other Ambulatory Visit: Payer: Self-pay

## 2019-11-09 ENCOUNTER — Encounter: Payer: Self-pay | Admitting: Physician Assistant

## 2019-11-09 ENCOUNTER — Ambulatory Visit (INDEPENDENT_AMBULATORY_CARE_PROVIDER_SITE_OTHER): Payer: Medicare HMO | Admitting: Physician Assistant

## 2019-11-09 VITALS — BP 125/85 | HR 89 | Temp 97.6°F | Ht 66.0 in | Wt 153.4 lb

## 2019-11-09 DIAGNOSIS — Z09 Encounter for follow-up examination after completed treatment for conditions other than malignant neoplasm: Secondary | ICD-10-CM

## 2019-11-09 MED ORDER — AMOXICILLIN-POT CLAVULANATE 875-125 MG PO TABS
1.0000 | ORAL_TABLET | Freq: Two times a day (BID) | ORAL | 0 refills | Status: AC
Start: 2019-11-09 — End: 2019-11-16

## 2019-11-09 NOTE — Progress Notes (Signed)
[facility] POST-OP OFFICE VISIT  11/09/2019  HPI: Katrina Sutton is a 69 y.o. female 9 days s/p umbilical hernia repair with Dr Lady Gary.   Today, she reports concerns over her incision and notes that is has been sore and has a burning sensation over the last 24-48 hours.  No fever or chills She is still wearing steri-strips No nausea, emesis, diarrhea Otherwise doing well  Vital signs: BP 125/85   Pulse 89   Temp 97.6 F (36.4 C) (Oral)   Ht 5\' 6"  (1.676 m)   Wt 153 lb 6.4 oz (69.6 kg)   SpO2 94%   BMI 24.76 kg/m    Physical Exam: Constitutional: Well appearing female, NAD Abdomen: Soft, umbilical soreness over incision, non-distended, no rebound/guarding Skin: I did remove steri-strips today, underlying incision is intact with small superficial skin opening on superior portion, there is an expected degree of surrounding erythema, otherwise is ecchymotic without drainage.   Assessment/Plan: This is a 70 y.o. female 9 days s/p umbilical hernia repair   - As a precaution I will send her with 1 weeks of Augmentin BID to cover any potential wound infection.   - Reviewed wound care, okay to shower, superficial dressings prn  - Reviewed lifting restrictions; 6 weeks total  - Reviewed return precautions: fever, chills, worsening erythema purulent drainage  - rtc on 09/07 as scheduled   -- 11/07, PA-C [facility] 11/09/2019, 11:57 AM [phone] M-F: 7am - 4pm

## 2019-11-09 NOTE — Patient Instructions (Addendum)
Laqueta Due, PA will prescribe Augmentin twice a day for 7 days. Patient will follow up.  Wound Care, Adult Taking care of your wound properly can help to prevent pain, infection, and scarring. It can also help your wound to heal more quickly. How to care for your wound Wound care      Follow instructions from your health care provider about how to take care of your wound. Make sure you: ? Wash your hands with soap and water before you change the bandage (dressing). If soap and water are not available, use hand sanitizer. ? Change your dressing as told by your health care provider. ? Leave stitches (sutures), skin glue, or adhesive strips in place. These skin closures may need to stay in place for 2 weeks or longer. If adhesive strip edges start to loosen and curl up, you may trim the loose edges. Do not remove adhesive strips completely unless your health care provider tells you to do that.  Check your wound area every day for signs of infection. Check for: ? Redness, swelling, or pain. ? Fluid or blood. ? Warmth. ? Pus or a bad smell.  Ask your health care provider if you should clean the wound with mild soap and water. Doing this may include: ? Using a clean towel to pat the wound dry after cleaning it. Do not rub or scrub the wound. ? Applying a cream or ointment. Do this only as told by your health care provider. ? Covering the incision with a clean dressing.  Ask your health care provider when you can leave the wound uncovered.  Keep the dressing dry until your health care provider says it can be removed. Do not take baths, swim, use a hot tub, or do anything that would put the wound underwater until your health care provider approves. Ask your health care provider if you can take showers. You may only be allowed to take sponge baths. Medicines   If you were prescribed an antibiotic medicine, cream, or ointment, take or use the antibiotic as told by your health care provider. Do  not stop taking or using the antibiotic even if your condition improves.  Take over-the-counter and prescription medicines only as told by your health care provider. If you were prescribed pain medicine, take it 30 or more minutes before you do any wound care or as told by your health care provider. General instructions  Return to your normal activities as told by your health care provider. Ask your health care provider what activities are safe.  Do not scratch or pick at the wound.  Do not use any products that contain nicotine or tobacco, such as cigarettes and e-cigarettes. These may delay wound healing. If you need help quitting, ask your health care provider.  Keep all follow-up visits as told by your health care provider. This is important.  Eat a diet that includes protein, vitamin A, vitamin C, and other nutrient-rich foods to help the wound heal. ? Foods rich in protein include meat, dairy, beans, nuts, and other sources. ? Foods rich in vitamin A include carrots and dark green, leafy vegetables. ? Foods rich in vitamin C include citrus, tomatoes, and other fruits and vegetables. ? Nutrient-rich foods have protein, carbohydrates, fat, vitamins, or minerals. Eat a variety of healthy foods including vegetables, fruits, and whole grains. Contact a health care provider if:  You received a tetanus shot and you have swelling, severe pain, redness, or bleeding at the injection site.  Your  pain is not controlled with medicine.  You have redness, swelling, or pain around the wound.  You have fluid or blood coming from the wound.  Your wound feels warm to the touch.  You have pus or a bad smell coming from the wound.  You have a fever or chills.  You are nauseous or you vomit.  You are dizzy. Get help right away if:  You have a red streak going away from your wound.  The edges of the wound open up and separate.  Your wound is bleeding, and the bleeding does not stop with  gentle pressure.  You have a rash.  You faint.  You have trouble breathing. Summary  Always wash your hands with soap and water before changing your bandage (dressing).  To help with healing, eat foods that are rich in protein, vitamin A, vitamin C, and other nutrients.  Check your wound every day for signs of infection. Contact your health care provider if you suspect that your wound is infected. This information is not intended to replace advice given to you by your health care provider. Make sure you discuss any questions you have with your health care provider. Document Revised: 06/19/2018 Document Reviewed: 09/16/2015 Elsevier Patient Education  2020 [employer]  GENERAL POST-OPERATIVE PATIENT INSTRUCTIONS   WOUND CARE INSTRUCTIONS:  Keep a dry clean dressing on the wound if there is drainage. The initial bandage may be removed after 24 hours.  Once the wound has quit draining you may leave it open to air.  If clothing rubs against the wound or causes irritation and the wound is not draining you may cover it with a dry dressing during the daytime.  Try to keep the wound dry and avoid ointments on the wound unless directed to do so.  If the wound becomes bright red and painful or starts to drain infected material that is not clear, please contact your physician immediately.  If the wound is mildly pink and has a thick firm ridge underneath it, this is normal, and is referred to as a healing ridge.  This will resolve over the next 4-6 weeks.  BATHING: You may shower if you have been informed of this by your surgeon. However, Please do not submerge in a tub, hot tub, or pool until incisions are completely sealed or have been told by your surgeon that you may do so.  DIET:  You may eat any foods that you can tolerate.  It is a good idea to eat a high fiber diet and take in plenty of fluids to prevent constipation.  If you do become constipated you may want to take a mild laxative or  take ducolax tablets on a daily basis until your bowel habits are regular.  Constipation can be very uncomfortable, along with straining, after recent surgery.  ACTIVITY:  You are encouraged to cough and deep breath or use your incentive spirometer if you were given one, every 15-30 minutes when awake.  This will help prevent respiratory complications and low grade fevers post-operatively if you had a general anesthetic.  You may want to hug a pillow when coughing and sneezing to add additional support to the surgical area, if you had abdominal or chest surgery, which will decrease pain during these times.  You are encouraged to walk and engage in light activity for the next two weeks.  You should not lift more than 20 pounds, 4 to 6 weeks after surgery as it could put you at  increased risk for complications.  Twenty pounds is roughly equivalent to a plastic bag of groceries. At that time- Listen to your body when lifting, if you have pain when lifting, stop and then try again in a few days. Soreness after doing exercises or activities of daily living is normal as you get back in to your normal routine.  MEDICATIONS:  Try to take narcotic medications and anti-inflammatory medications, such as tylenol, ibuprofen, naprosyn, etc., with food.  This will minimize stomach upset from the medication.  Should you develop nausea and vomiting from the pain medication, or develop a rash, please discontinue the medication and contact your physician.  You should not drive, make important decisions, or operate machinery when taking narcotic pain medication.  SUNBLOCK Use sun block to incision area over the next year if this area will be exposed to sun. This helps decrease scarring and will allow you avoid a permanent darkened area over your incision.  QUESTIONS:  Please feel free to call our office if you have any questions, and we will be glad to assist you. [phone].

## 2019-11-12 ENCOUNTER — Ambulatory Visit: Payer: Self-pay | Admitting: General Practice

## 2019-11-12 DIAGNOSIS — K449 Diaphragmatic hernia without obstruction or gangrene: Secondary | ICD-10-CM

## 2019-11-12 DIAGNOSIS — F419 Anxiety disorder, unspecified: Secondary | ICD-10-CM

## 2019-11-12 DIAGNOSIS — F418 Other specified anxiety disorders: Secondary | ICD-10-CM | POA: Diagnosis not present

## 2019-11-12 DIAGNOSIS — I10 Essential (primary) hypertension: Secondary | ICD-10-CM | POA: Diagnosis not present

## 2019-11-12 DIAGNOSIS — F319 Bipolar disorder, unspecified: Secondary | ICD-10-CM

## 2019-11-12 DIAGNOSIS — Z09 Encounter for follow-up examination after completed treatment for conditions other than malignant neoplasm: Secondary | ICD-10-CM

## 2019-11-12 NOTE — Chronic Care Management (AMB) (Signed)
Chronic Care Management   Follow Up Note   11/12/2019 Name: Katrina Sutton MRN: [id number] DOB: [birth year]  Referred by: Tarri Fuller, FNP Reason for referral : Chronic Care Management (RNCM call back after VM and EMMI red)   Katrina Sutton is a 69 y.o. year old female who is a primary care patient of Tarri Fuller, FNP. The CCM team was consulted for assistance with chronic disease management and care coordination needs.    Review of patient status, including review of consultants reports, relevant laboratory and other test results, and collaboration with appropriate care team members and the patient's provider was performed as part of comprehensive patient evaluation and provision of chronic care management services.    SDOH (Social Determinants of Health) assessments performed: Yes See Care Plan activities for detailed interventions related to [facility])     Outpatient Encounter Medications as of 11/12/2019  Medication Sig Note  . acetaminophen (TYLENOL) 500 MG tablet Take 500 mg by mouth every 8 (eight) hours as needed for headache.   Marland Kitchen amoxicillin-clavulanate (AUGMENTIN) 875-125 MG tablet Take 1 tablet by mouth 2 (two) times daily for 7 days.   . clonazePAM (KLONOPIN) 1 MG tablet TAKE 1 TABLET BY MOUTH TWICE A DAY AS NEEDED ANXIETY   . fluticasone (FLONASE) 50 MCG/ACT nasal spray Place 1 spray into both nostrils daily as needed.   . hydrocortisone cream 1 % Apply 1 application topically 2 (two) times daily as needed for itching.  06/19/2019: Very seldom   . linaclotide (LINZESS) 145 MCG CAPS capsule Take 1 capsule (145 mcg total) by mouth daily before breakfast. (Patient not taking: Reported on 11/09/2019)   . lisinopril (ZESTRIL) 5 MG tablet Take 1 tablet (5 mg total) by mouth daily.   Marland Kitchen omeprazole (PRILOSEC) 40 MG capsule Take 1 capsule (40 mg total) by mouth daily. In morning   . ondansetron (ZOFRAN-ODT) 4 MG disintegrating tablet Take 1 tablet (4 mg total) by mouth every 8 (eight) hours  as needed for nausea or vomiting.   Marland Kitchen PARoxetine (PAXIL) 40 MG tablet TAKE 1 TABLET BY MOUTH DAILY   . polyethylene glycol (MIRALAX / GLYCOLAX) 17 g packet Take 17 g by mouth daily.   . sodium chloride (OCEAN) 0.65 % SOLN nasal spray Place 1 spray into both nostrils as needed for congestion.    No facility-administered encounter medications on file as of 11/12/2019.     Objective:  BP Readings from Last 3 Encounters:  11/09/19 125/85  11/05/19 140/71  11/01/19 134/71    Goals Addressed              This Visit's Progress   .  RNCM: PT-"I am doing the best I can managing my care" (pt-stated)        CARE PLAN ENTRY (see longtitudinal plan of care for additional care plan information)  Current Barriers:  . Chronic Disease Management support, education, and care coordination needs related to Anxiety, Depression, Bipolar Disorder, and Paraesophageal hernia   Clinical Goal(s) related to Anxiety, Depression, Bipolar Disorder, and Paraesophageal hernia :  Over the next 120 days, patient will:  . Work with the care management team to address educational, disease management, and care coordination needs  . Begin or continue self health monitoring activities as directed today  follow a soft diet, sit up straight to prevent choking hazard, monitor for s/s of dehydration . Call provider office for new or worsened signs and symptoms Oxygen saturation lower than established parameter, Shortness of  breath, and New or worsened symptom related to Anxiety/depression/bipolar disorder or complications from Paraesophageal hernia as evidence by recent hospitalization due to hypoxia  . Call care management team with questions or concerns . Verbalize basic understanding of patient centered plan of care established today  Interventions related to Anxiety, Depression, Bipolar Disorder, and Paraesophageal hernia :  . Evaluation of current treatment plans and patient's adherence to plan as established by  provider- 11-05-2019: The patient seen in the office today by the pcp and RNCM for post discharge appointment and review of chronic conditions. The patient had umbilical hernia repair on 8-18 and is recovering well. She states she is taking Tylenol for pain. Has stronger medication if needed but has not had to take anything stronger. The patient verbalized understanding of discharge instructions and how to care for surgical site. The patient follows up with the surgeon on 11-20-2019. 11-12-2019: The patient had left a VM for the LCSW asking about ice on her abdomen and bowel movements. The LCSW collaborated with the [facility]. A call was made to the patient by the [facility]. The patient was concerned if she was still needing to apply ice compresses to her abdomen. Education on using ice if she was having pain in her abdomen or for relief of swelling.  The patient denies pain or discomfort. She saw the surgeon on Friday for follow up and the surgical site looks good. The patient also wanted to ask about bowel movements.  . Assessed patient understanding of disease states- 11-05-2019: The patient is doing well post surgical hernia repair. The patient verbalized she had been really sick prior to hospitalization but is feeling so much better. Site healing well. Sees surgeon for follow up in September. States some "runny" bowel movements. Education on possible changes in bowel habits post anesthesia events. The patient is following the recommendations of the provider. 11-12-2019: The patient verbalized she is not having a bowel movement every day. She endorses taking the miralax as directed. The patient also states that she is staying well hydrated. The patient denies hard, or difficult bowel movements.  Education on eating fruits and vegetables and also incorporating prunes or prune juice into her diet.  The patient denies constipation, but states she has occasional bloating. She is also passing gas. Education and support given.   . Assessed patient's education and care coordination needs.  Education on what worse looks like and to call for changes in condition. The patient verbalized that she is doing well and is happy that she has had the surgery to repair the umbilical hernia. 11-12-2019: Review of discharge instructions again. Extensive education on what worse looks like and to call the provider for fever, chills, purulent or smelly discharge, or any unusual changes at the surgical site. The patient denies any issues related her surgical site.  . Provided disease specific education to patient- education on eating foods that are soft and easy to swallow, education on sitting straight up when eating to prevent choking, education on staying hydrated and monitor for s/s of dehydration. The patient is drinking "plenty of water", also she is drinking gatorade and Ensure at times. The patient states she is being careful when eating. The patient is managing her symptoms well during the day with positive changes in her diet. 11-12-2019: Ongoing support and review. The patient states that she is eating well and also drinking plenty of liquids.  Steele Sizer with appropriate clinical care team members regarding patient needs: Ongoing support from LCSW for stress,  anxiety, and depression. Pharmacy consult for recommendations and help with clarification of omeprazole dosage and recommendations.  11-05-2019- The patient continues to work with LCSW and pharmacist. Knows the CCM team is here to help with her health and wellness needs.  . Evaluation of depression and anxiety: The patient accompanied by her husband. The patient verbalized that she is feeling so much better and is happy to be home from the hospital.  Feels this was a much needed surgery and is happy with the results.Denies any discomfort. The patient encouraged to cough and deep breath to prevent any difficulties with pneumonia post hospitalization. Education on splinting her abdomen  when she coughs to add extra support.  The patient is thankful for the support of the team.  Will continue to follow for changes and needs.   Patient Self Care Activities related to Anxiety, Depression, Bipolar Disorder, and Paraesophageal hernia :  . Patient is unable to independently self-manage chronic health conditions  Please see past updates related to this goal by clicking on the "Past Updates" button in the selected goal          Plan:   The care management team will reach out to the patient again over the next 30 days.    Alto Denver RN, MSN, CCM Community Care Coordinator [place]  [facility] [place] Mobile: [phone]

## 2019-11-12 NOTE — Patient Instructions (Signed)
Visit Information  Goals Addressed              This Visit's Progress     RNCM: PT-"I am doing the best I can managing my care" (pt-stated)        CARE PLAN ENTRY (see longtitudinal plan of care for additional care plan information)  Current Barriers:   Chronic Disease Management support, education, and care coordination needs related to Anxiety, Depression, Bipolar Disorder, and Paraesophageal hernia   Clinical Goal(s) related to Anxiety, Depression, Bipolar Disorder, and Paraesophageal hernia :  Over the next 120 days, patient will:   Work with the care management team to address educational, disease management, and care coordination needs   Begin or continue self health monitoring activities as directed today  follow a soft diet, sit up straight to prevent choking hazard, monitor for s/s of dehydration  Call provider office for new or worsened signs and symptoms Oxygen saturation lower than established parameter, Shortness of breath, and New or worsened symptom related to Anxiety/depression/bipolar disorder or complications from Paraesophageal hernia as evidence by recent hospitalization due to hypoxia   Call care management team with questions or concerns  Verbalize basic understanding of patient centered plan of care established today  Interventions related to Anxiety, Depression, Bipolar Disorder, and Paraesophageal hernia :   Evaluation of current treatment plans and patient's adherence to plan as established by provider- 11-05-2019: The patient seen in the office today by the pcp and RNCM for post discharge appointment and review of chronic conditions. The patient had umbilical hernia repair on 8-18 and is recovering well. She states she is taking Tylenol for pain. Has stronger medication if needed but has not had to take anything stronger. The patient verbalized understanding of discharge instructions and how to care for surgical site. The patient follows up with the surgeon  on 11-20-2019. 11-12-2019: The patient had left a VM for the LCSW asking about ice on her abdomen and bowel movements. The LCSW collaborated with the [facility]. A call was made to the patient by the [facility]. The patient was concerned if she was still needing to apply ice compresses to her abdomen. Education on using ice if she was having pain in her abdomen or for relief of swelling.  The patient denies pain or discomfort. She saw the surgeon on Friday for follow up and the surgical site looks good. The patient also wanted to ask about bowel movements.   Assessed patient understanding of disease states- 11-05-2019: The patient is doing well post surgical hernia repair. The patient verbalized she had been really sick prior to hospitalization but is feeling so much better. Site healing well. Sees surgeon for follow up in September. States some "runny" bowel movements. Education on possible changes in bowel habits post anesthesia events. The patient is following the recommendations of the provider. 11-12-2019: The patient verbalized she is not having a bowel movement every day. She endorses taking the miralax as directed. The patient also states that she is staying well hydrated. The patient denies hard, or difficult bowel movements.  Education on eating fruits and vegetables and also incorporating prunes or prune juice into her diet.  The patient denies constipation, but states she has occasional bloating. She is also passing gas. Education and support given.   Assessed patient's education and care coordination needs.  Education on what worse looks like and to call for changes in condition. The patient verbalized that she is doing well and is happy that she has had  the surgery to repair the umbilical hernia. 11-12-2019: Review of discharge instructions again. Extensive education on what worse looks like and to call the provider for fever, chills, purulent or smelly discharge, or any unusual changes at the surgical site. The  patient denies any issues related her surgical site.   Provided disease specific education to patient- education on eating foods that are soft and easy to swallow, education on sitting straight up when eating to prevent choking, education on staying hydrated and monitor for s/s of dehydration. The patient is drinking "plenty of water", also she is drinking gatorade and Ensure at times. The patient states she is being careful when eating. The patient is managing her symptoms well during the day with positive changes in her diet. 11-12-2019: Ongoing support and review. The patient states that she is eating well and also drinking plenty of liquids.   Collaborated with appropriate clinical care team members regarding patient needs: Ongoing support from LCSW for stress, anxiety, and depression. Pharmacy consult for recommendations and help with clarification of omeprazole dosage and recommendations.  11-05-2019- The patient continues to work with LCSW and pharmacist. Knows the CCM team is here to help with her health and wellness needs.   Evaluation of depression and anxiety: The patient accompanied by her husband. The patient verbalized that she is feeling so much better and is happy to be home from the hospital.  Feels this was a much needed surgery and is happy with the results.Denies any discomfort. The patient encouraged to cough and deep breath to prevent any difficulties with pneumonia post hospitalization. Education on splinting her abdomen when she coughs to add extra support.  The patient is thankful for the support of the team.  Will continue to follow for changes and needs.   Patient Self Care Activities related to Anxiety, Depression, Bipolar Disorder, and Paraesophageal hernia :   Patient is unable to independently self-manage chronic health conditions  Please see past updates related to this goal by clicking on the "Past Updates" button in the selected goal         Patient verbalizes  understanding of instructions provided today.   The care management team will reach out to the patient again over the next 30 days.   Alto Denver RN, MSN, CCM Community Care Coordinator [place]   [facility] [place] Mobile: [phone]

## 2019-11-16 ENCOUNTER — Ambulatory Visit: Payer: Medicare HMO | Admitting: Family Medicine

## 2019-11-20 ENCOUNTER — Other Ambulatory Visit: Payer: Self-pay

## 2019-11-20 ENCOUNTER — Encounter: Payer: Self-pay | Admitting: Physician Assistant

## 2019-11-20 ENCOUNTER — Ambulatory Visit (INDEPENDENT_AMBULATORY_CARE_PROVIDER_SITE_OTHER): Payer: Medicare HMO | Admitting: Physician Assistant

## 2019-11-20 VITALS — BP 146/88 | HR 91 | Temp 98.6°F | Resp 12 | Wt 150.8 lb

## 2019-11-20 DIAGNOSIS — Z09 Encounter for follow-up examination after completed treatment for conditions other than malignant neoplasm: Secondary | ICD-10-CM

## 2019-11-20 NOTE — Progress Notes (Signed)
[facility] POST-OP OFFICE VISIT  11/20/2019  HPI: Katrina Sutton is a 69 y.o. female 20 days s/p umbilical hernia repair with Dr Lady Gary.  She had been prescribed Augmentin last week for possible wound infection however she only took 1 pill of this and stopped due to inability to swallow Since then, she reports that her wound has looked much better and has no concerns.  Intermittent twinges of pain but these are mild No fever, chills, nausea, emesis, constipation No other complaints.   Vital signs: BP (!) 146/88   Pulse 91   Temp 98.6 F (37 C)   Resp 12   Wt 150 lb 12.8 oz (68.4 kg)   SpO2 96%   BMI 24.34 kg/m    Physical Exam: Constitutional: well appearing female, NAD Abdomen: Soft, non-tender, non-distended, no rebound/guarding Skin: Umbilical incision is healing well, there is scab present over medial portion of the wound, no surrounding erythema or drainage, there is expected induration  Assessment/Plan: This is a 69 y.o. female 20 days s/p umbilical hernia repair   - No need for ABx, no evidence of infection  - Reviewed wound care  - Reviewed lifting restrictions  - Pain control prn  - She will follow up with [place] prn, she has a history of hiatal hernia for whom she has seen Dr Everlene Farrier. She understands the needed work up prior to consideration of repair. She will follow up with [place] as needed for now.   -- Lynden Oxford, PA-C [facility] 11/20/2019, 10:18 AM [phone] M-F: 7am - 4pm

## 2019-11-20 NOTE — Patient Instructions (Addendum)
Keep the area covered with a Band-Aid as needed. Follow up as needed, call the office if you have any questions or concerns.

## 2019-11-29 ENCOUNTER — Telehealth: Payer: Self-pay

## 2019-12-03 ENCOUNTER — Encounter: Payer: Self-pay | Admitting: Family Medicine

## 2019-12-03 ENCOUNTER — Telehealth: Payer: Self-pay

## 2019-12-03 ENCOUNTER — Other Ambulatory Visit: Payer: Self-pay

## 2019-12-03 ENCOUNTER — Ambulatory Visit (INDEPENDENT_AMBULATORY_CARE_PROVIDER_SITE_OTHER): Payer: Medicare HMO | Admitting: Family Medicine

## 2019-12-03 VITALS — BP 129/76 | HR 77 | Temp 98.4°F | Resp 18 | Ht 66.0 in | Wt 151.4 lb

## 2019-12-03 DIAGNOSIS — I10 Essential (primary) hypertension: Secondary | ICD-10-CM

## 2019-12-03 NOTE — Assessment & Plan Note (Signed)
BP readings < 130/80 in clinic.  When taking 5mg  of lisinopril had a few episodes of low BP readings at home and dizziness.  Will stop lisinopril 5mg  as of now.  Patient to continue monitoring home BP.  Plan: 1. Continue to check your home BP 1-2x per week, if finding BP > 130/80 consistently to RTC sooner 2. RTC in 3 months

## 2019-12-03 NOTE — Patient Instructions (Signed)
Can contact [facility] at [phone]  Please contact one of these locations below to schedule an appointment for therapy  [facility] [address] [place], [place] [address] Phone# [phone]  [facility] [address] [place], [place] [address] Phone# [phone]  [facility] [address] [place], [place] [address] Phone# [phone]  [facility] [address] [place], [place] [address] Phone# [phone]  Continue all medications as prescribed.  STOP the lisinopril, your blood pressure has remained under 130/80.  We will plan to see you back in 3 months for depression follow up visit  You will receive a survey after today's visit either digitally by e-mail or paper by USPS mail. Your experiences and feedback matter to [place].  Please respond so we know how we are doing as we provide care for you.  Call us with any questions/concerns/needs.  It is my goal to be available to you for your health concerns.  Thanks for choosing me to be a partner in your healthcare needs!  Charlaine Dalton, FNP-C Family Nurse Practitioner [facility] Phone: [phone]

## 2019-12-03 NOTE — Progress Notes (Signed)
Subjective:    Patient ID: Katrina Sutton, female    DOB: 1950/12/26, 69 y.o.   MRN: [id number]  Katrina Sutton is a 69 y.o. female presenting on 12/03/2019 for Hypertension   HPI  Katrina Sutton presents to clinic for follow up on her hypertension.  Reports she has been checking her blood pressure and is consistently < 130/80.  Has stopped taking the lisinopril without change in her BP.  Requesting to stop this medication.  Depression screen [facility] 2/9 09/13/2019 08/30/2019 06/19/2019  Decreased Interest 0 0 0  Down, Depressed, Hopeless 0 1 0  PHQ - 2 Score 0 1 0  Altered sleeping 0 1 -  Tired, decreased energy 0 1 -  Change in appetite 0 0 -  Feeling bad or failure about yourself  0 1 -  Trouble concentrating 0 1 -  Moving slowly or fidgety/restless 0 0 -  Suicidal thoughts 0 0 -  PHQ-9 Score 0 5 -  Difficult doing work/chores Not difficult at all Not difficult at all -  Some recent data might be hidden    Social History   Tobacco Use  . Smoking status: Former Smoker    Quit date: 12/27/1976    Years since quitting: 42.9  . Smokeless tobacco: Never Used  Vaping Use  . Vaping Use: Never used  Substance Use Topics  . Alcohol use: No  . Drug use: No    Review of Systems  Constitutional: Negative.   HENT: Negative.   Eyes: Negative.   Respiratory: Negative.   Cardiovascular: Negative.   Gastrointestinal: Negative.   Endocrine: Negative.   Genitourinary: Negative.   Musculoskeletal: Negative.   Skin: Negative.   Allergic/Immunologic: Negative.   Neurological: Negative.   Hematological: Negative.   Psychiatric/Behavioral: Negative.    Per HPI unless specifically indicated above     Objective:    BP 129/76 (BP Location: Left Arm, Patient Position: Sitting, Cuff Size: Normal)   Pulse 77   Temp 98.4 F (36.9 C) (Oral)   Resp 18   Ht 5\' 6"  (1.676 m)   Wt 151 lb 6.4 oz (68.7 kg)   SpO2 99%   BMI 24.44 kg/m   Wt Readings from Last 3 Encounters:  12/03/19 151 lb 6.4 oz  (68.7 kg)  11/20/19 150 lb 12.8 oz (68.4 kg)  11/09/19 153 lb 6.4 oz (69.6 kg)    Physical Exam Vitals reviewed.  Constitutional:      General: She is not in acute distress.    Appearance: Normal appearance. She is well-developed and well-groomed. She is not ill-appearing or toxic-appearing.  HENT:     Head: Normocephalic and atraumatic.     Nose:     Comments: 11/11/19 is in place, covering mouth and nose. Eyes:     General: Lids are normal. Vision grossly intact.        Right eye: No discharge.        Left eye: No discharge.     Extraocular Movements: Extraocular movements intact.     Conjunctiva/sclera: Conjunctivae normal.     Pupils: Pupils are equal, round, and reactive to light.  Cardiovascular:     Rate and Rhythm: Normal rate and regular rhythm.     Pulses: Normal pulses.          Dorsalis pedis pulses are 2+ on the right side and 2+ on the left side.     Heart sounds: Normal heart sounds. No murmur heard.  No friction rub. No  gallop.   Pulmonary:     Effort: Pulmonary effort is normal. No respiratory distress.     Breath sounds: Normal breath sounds.  Musculoskeletal:     Right lower leg: No edema.     Left lower leg: No edema.  Skin:    General: Skin is warm and dry.     Capillary Refill: Capillary refill takes less than 2 seconds.  Neurological:     General: No focal deficit present.     Mental Status: She is alert and oriented to person, place, and time.  Psychiatric:        Attention and Perception: Attention and perception normal.        Mood and Affect: Mood and affect normal.        Speech: Speech normal.        Behavior: Behavior normal. Behavior is cooperative.        Thought Content: Thought content normal.        Cognition and Memory: Cognition and memory normal.        Judgment: Judgment normal.    Results for orders placed or performed during the hospital encounter of 10/26/19  SARS Coronavirus 2 by RT PCR (hospital order, performed in [facility] hospital lab) Nasopharyngeal Nasopharyngeal Swab   Specimen: Nasopharyngeal Swab  Result Value Ref Range   SARS Coronavirus 2 NEGATIVE NEGATIVE  Surgical pcr screen   Specimen: Nasal Mucosa; Nasal Swab  Result Value Ref Range   MRSA, PCR NEGATIVE NEGATIVE   Staphylococcus aureus NEGATIVE NEGATIVE  CBC with Differential  Result Value Ref Range   WBC 22.2 (H) 4.0 - 10.5 K/uL   RBC 5.31 (H) 3.87 - 5.11 MIL/uL   Hemoglobin 16.4 (H) 12.0 - 15.0 g/dL   HCT 18.8 (H) 36 - 46 %   MCV 90.4 80.0 - 100.0 fL   MCH 30.9 26.0 - 34.0 pg   MCHC 34.2 30.0 - 36.0 g/dL   RDW 41.6 60.6 - 30.1 %   Platelets 442 (H) 150 - 400 K/uL   nRBC 0.0 0.0 - 0.2 %   Neutrophils Relative % 85 %   Neutro Abs 18.9 (H) 1.7 - 7.7 K/uL   Lymphocytes Relative 9 %   Lymphs Abs 1.9 0.7 - 4.0 K/uL   Monocytes Relative 5 %   Monocytes Absolute 1.1 (H) 0 - 1 K/uL   Eosinophils Relative 0 %   Eosinophils Absolute 0.0 0 - 0 K/uL   Basophils Relative 0 %   Basophils Absolute 0.1 0 - 0 K/uL   Immature Granulocytes 1 %   Abs Immature Granulocytes 0.11 (H) 0.00 - 0.07 K/uL  Comprehensive metabolic panel  Result Value Ref Range   Sodium 136 135 - 145 mmol/L   Potassium 3.1 (L) 3.5 - 5.1 mmol/L   Chloride 97 (L) 98 - 111 mmol/L   CO2 24 22 - 32 mmol/L   Glucose, Bld 200 (H) 70 - 99 mg/dL   BUN 7 (L) 8 - 23 mg/dL   Creatinine, Ser 6.01 (H) 0.44 - 1.00 mg/dL   Calcium 09.3 8.9 - 23.5 mg/dL   Total Protein 8.2 (H) 6.5 - 8.1 g/dL   Albumin 4.7 3.5 - 5.0 g/dL   AST 31 15 - 41 U/L   ALT 27 0 - 44 U/L   Alkaline Phosphatase 144 (H) 38 - 126 U/L   Total Bilirubin 0.9 0.3 - 1.2 mg/dL   GFR calc non Af Amer 37 (L) >60 mL/min   GFR  calc Af Amer 43 (L) >60 mL/min   Anion gap 15 5 - 15  Lactic acid, plasma  Result Value Ref Range   Lactic Acid, Venous 1.7 0.5 - 1.9 mmol/L  Lactic acid, plasma  Result Value Ref Range   Lactic Acid, Venous 0.9 0.5 - 1.9 mmol/L  Lipase, blood  Result Value Ref Range   Lipase 74 (H) 11  - 51 U/L  Comprehensive metabolic panel  Result Value Ref Range   Sodium 140 135 - 145 mmol/L   Potassium 3.0 (L) 3.5 - 5.1 mmol/L   Chloride 103 98 - 111 mmol/L   CO2 25 22 - 32 mmol/L   Glucose, Bld 135 (H) 70 - 99 mg/dL   BUN 7 (L) 8 - 23 mg/dL   Creatinine, Ser 1.01 (H) 0.44 - 1.00 mg/dL   Calcium 8.8 (L) 8.9 - 10.3 mg/dL   Total Protein 6.8 6.5 - 8.1 g/dL   Albumin 3.8 3.5 - 5.0 g/dL   AST 35 15 - 41 U/L   ALT 29 0 - 44 U/L   Alkaline Phosphatase 128 (H) 38 - 126 U/L   Total Bilirubin 0.9 0.3 - 1.2 mg/dL   GFR calc non Af Amer 47 (L) >60 mL/min   GFR calc Af Amer 55 (L) >60 mL/min   Anion gap 12 5 - 15  CBC  Result Value Ref Range   WBC 20.6 (H) 4.0 - 10.5 K/uL   RBC 4.77 3.87 - 5.11 MIL/uL   Hemoglobin 14.7 12.0 - 15.0 g/dL   HCT 75.1 36 - 46 %   MCV 88.5 80.0 - 100.0 fL   MCH 30.8 26.0 - 34.0 pg   MCHC 34.8 30.0 - 36.0 g/dL   RDW 02.5 85.2 - 77.8 %   Platelets 319 150 - 400 K/uL   nRBC 0.0 0.0 - 0.2 %  CBC with Differential/Platelet  Result Value Ref Range   WBC 10.2 4.0 - 10.5 K/uL   RBC 4.05 3.87 - 5.11 MIL/uL   Hemoglobin 12.2 12.0 - 15.0 g/dL   HCT 24.2 36 - 46 %   MCV 93.3 80.0 - 100.0 fL   MCH 30.1 26.0 - 34.0 pg   MCHC 32.3 30.0 - 36.0 g/dL   RDW 35.3 61.4 - 43.1 %   Platelets 263 150 - 400 K/uL   nRBC 0.0 0.0 - 0.2 %   Neutrophils Relative % 80 %   Neutro Abs 8.2 (H) 1.7 - 7.7 K/uL   Lymphocytes Relative 11 %   Lymphs Abs 1.1 0.7 - 4.0 K/uL   Monocytes Relative 8 %   Monocytes Absolute 0.8 0 - 1 K/uL   Eosinophils Relative 1 %   Eosinophils Absolute 0.1 0 - 0 K/uL   Basophils Relative 0 %   Basophils Absolute 0.0 0 - 0 K/uL   Immature Granulocytes 0 %   Abs Immature Granulocytes 0.02 0.00 - 0.07 K/uL  Basic metabolic panel  Result Value Ref Range   Sodium 146 (H) 135 - 145 mmol/L   Potassium 3.3 (L) 3.5 - 5.1 mmol/L   Chloride 108 98 - 111 mmol/L   CO2 28 22 - 32 mmol/L   Glucose, Bld 113 (H) 70 - 99 mg/dL   BUN 7 (L) 8 - 23 mg/dL    Creatinine, Ser 5.40 0.44 - 1.00 mg/dL   Calcium 8.6 (L) 8.9 - 10.3 mg/dL   GFR calc non Af Amer >60 >60 mL/min   GFR calc Af Amer >60 >  60 mL/min   Anion gap 10 5 - 15  Magnesium  Result Value Ref Range   Magnesium 1.7 1.7 - 2.4 mg/dL  Phosphorus  Result Value Ref Range   Phosphorus 2.6 2.5 - 4.6 mg/dL  CBC with Differential/Platelet  Result Value Ref Range   WBC 12.5 (H) 4.0 - 10.5 K/uL   RBC 4.40 3.87 - 5.11 MIL/uL   Hemoglobin 13.5 12.0 - 15.0 g/dL   HCT 16.141.2 36 - 46 %   MCV 93.6 80.0 - 100.0 fL   MCH 30.7 26.0 - 34.0 pg   MCHC 32.8 30.0 - 36.0 g/dL   RDW 09.613.2 04.511.5 - 40.915.5 %   Platelets 276 150 - 400 K/uL   nRBC 0.0 0.0 - 0.2 %   Neutrophils Relative % 81 %   Neutro Abs 10.0 (H) 1.7 - 7.7 K/uL   Lymphocytes Relative 11 %   Lymphs Abs 1.4 0.7 - 4.0 K/uL   Monocytes Relative 7 %   Monocytes Absolute 0.9 0 - 1 K/uL   Eosinophils Relative 1 %   Eosinophils Absolute 0.1 0 - 0 K/uL   Basophils Relative 0 %   Basophils Absolute 0.0 0 - 0 K/uL   Immature Granulocytes 0 %   Abs Immature Granulocytes 0.04 0.00 - 0.07 K/uL  Basic metabolic panel  Result Value Ref Range   Sodium 143 135 - 145 mmol/L   Potassium 4.2 3.5 - 5.1 mmol/L   Chloride 107 98 - 111 mmol/L   CO2 25 22 - 32 mmol/L   Glucose, Bld 122 (H) 70 - 99 mg/dL   BUN 6 (L) 8 - 23 mg/dL   Creatinine, Ser 8.110.68 0.44 - 1.00 mg/dL   Calcium 9.0 8.9 - 91.410.3 mg/dL   GFR calc non Af Amer >60 >60 mL/min   GFR calc Af Amer >60 >60 mL/min   Anion gap 11 5 - 15  Magnesium  Result Value Ref Range   Magnesium 1.7 1.7 - 2.4 mg/dL  Basic metabolic panel  Result Value Ref Range   Sodium 140 135 - 145 mmol/L   Potassium 3.8 3.5 - 5.1 mmol/L   Chloride 105 98 - 111 mmol/L   CO2 26 22 - 32 mmol/L   Glucose, Bld 114 (H) 70 - 99 mg/dL   BUN 6 (L) 8 - 23 mg/dL   Creatinine, Ser 7.820.80 0.44 - 1.00 mg/dL   Calcium 8.9 8.9 - 95.610.3 mg/dL   GFR calc non Af Amer >60 >60 mL/min   GFR calc Af Amer >60 >60 mL/min   Anion gap 9 5 - 15   Magnesium  Result Value Ref Range   Magnesium 1.5 (L) 1.7 - 2.4 mg/dL  Magnesium  Result Value Ref Range   Magnesium 2.3 1.7 - 2.4 mg/dL  Basic metabolic panel  Result Value Ref Range   Sodium 141 135 - 145 mmol/L   Potassium 3.8 3.5 - 5.1 mmol/L   Chloride 105 98 - 111 mmol/L   CO2 24 22 - 32 mmol/L   Glucose, Bld 86 70 - 99 mg/dL   BUN 8 8 - 23 mg/dL   Creatinine, Ser 2.130.95 0.44 - 1.00 mg/dL   Calcium 9.4 8.9 - 08.610.3 mg/dL   GFR calc non Af Amer >60 >60 mL/min   GFR calc Af Amer >60 >60 mL/min   Anion gap 12 5 - 15  CBC  Result Value Ref Range   WBC 7.7 4.0 - 10.5 K/uL   RBC 4.57 3.87 -  5.11 MIL/uL   Hemoglobin 14.0 12.0 - 15.0 g/dL   HCT 98.1 36 - 46 %   MCV 90.8 80.0 - 100.0 fL   MCH 30.6 26.0 - 34.0 pg   MCHC 33.7 30.0 - 36.0 g/dL   RDW 19.1 47.8 - 29.5 %   Platelets 304 150 - 400 K/uL   nRBC 0.0 0.0 - 0.2 %  Basic metabolic panel  Result Value Ref Range   Sodium 142 135 - 145 mmol/L   Potassium 3.9 3.5 - 5.1 mmol/L   Chloride 106 98 - 111 mmol/L   CO2 26 22 - 32 mmol/L   Glucose, Bld 74 70 - 99 mg/dL   BUN 11 8 - 23 mg/dL   Creatinine, Ser 6.21 0.44 - 1.00 mg/dL   Calcium 9.1 8.9 - 30.8 mg/dL   GFR calc non Af Amer >60 >60 mL/min   GFR calc Af Amer >60 >60 mL/min   Anion gap 10 5 - 15  CBC  Result Value Ref Range   WBC 12.0 (H) 4.0 - 10.5 K/uL   RBC 4.61 3.87 - 5.11 MIL/uL   Hemoglobin 14.3 12.0 - 15.0 g/dL   HCT 65.7 36 - 46 %   MCV 89.2 80.0 - 100.0 fL   MCH 31.0 26.0 - 34.0 pg   MCHC 34.8 30.0 - 36.0 g/dL   RDW 84.6 96.2 - 95.2 %   Platelets 295 150 - 400 K/uL   nRBC 0.0 0.0 - 0.2 %  Magnesium  Result Value Ref Range   Magnesium 2.0 1.7 - 2.4 mg/dL      Assessment & Plan:   Problem List Items Addressed This Visit      Cardiovascular and Mediastinum   Hypertension - Primary    BP readings < 130/80 in clinic.  When taking  of lisinopril had a few episodes of low BP readings at home and dizziness.  Will stop lisinopril  as of now.   Patient to continue monitoring home BP.  Plan: 1. Continue to check your home BP 1-2x per week, if finding BP > 130/80 consistently to RTC sooner 2. RTC in 3 months         No orders of the defined types were placed in this encounter.  Follow up plan: Return in about 3 months (around 03/03/2020) for Depression follow up.   Charlaine Dalton, FNP Family Nurse Practitioner [facility] 12/03/2019, 2:04 PM

## 2019-12-06 ENCOUNTER — Telehealth: Payer: Self-pay | Admitting: Family Medicine

## 2019-12-06 NOTE — Telephone Encounter (Signed)
Would encourage her to call GI to ask their opinion since she has just had surgery and had a small bowel obstruction.

## 2019-12-06 NOTE — Telephone Encounter (Signed)
Pt is calling and would like to know if she can stop linzess due to cramping and diarrhea. Dr Kara Dies prescribed the medication for the pt and dr Tomasa Blase general surgery told her to stay on linzess. Pt would like to know if she can just take miralax. Please advise

## 2019-12-07 ENCOUNTER — Telehealth: Payer: Self-pay

## 2019-12-07 ENCOUNTER — Telehealth: Payer: Self-pay | Admitting: General Practice

## 2019-12-07 NOTE — Telephone Encounter (Signed)
Stop miralax continue linzess should stabilize

## 2019-12-07 NOTE — Telephone Encounter (Signed)
The pt was notified of recommendation. She verbalize understanding.

## 2019-12-07 NOTE — Telephone Encounter (Cosign Needed)
  Chronic Care Management   Note  12/07/2019 Name: Katrina Sutton MRN: [id number] DOB: 1950-11-22  Outgoing call to the patient after the patient left VM x2 on [facility] secure messaging system on 12-06-2019.  The patient wanted to know about a medication she was taking- linzess. The patient had contacted the pcp office and was told to call her GI provider for instructions. On the patients voice mail the RNCM left a detailed confidential message asking her to follow up with her GI specialist concerning her medication questions as her GI provider was the prescribing MD of medication in question.    Follow up plan:  Will continue to monitor and follow up per normal outreach.   Alto Denver RN, MSN, CCM Community Care Coordinator [place]  [facility] [place] Mobile: [phone]

## 2019-12-07 NOTE — Telephone Encounter (Signed)
Pt has been notified of Dr. Anna's recommendations. 

## 2019-12-07 NOTE — Telephone Encounter (Signed)
Pt called requesting Dr. Johnney Killian advice. Pt states she had an episode of diarrhea several times yesterday and as pt is currently taking Linzess and Miralax pt wants to know if she should stop the Linzess and only take the Miralax. Pt states she has not had another episode of diarrhea this morning and she didn't take either medication.

## 2019-12-11 DIAGNOSIS — M1711 Unilateral primary osteoarthritis, right knee: Secondary | ICD-10-CM | POA: Diagnosis not present

## 2019-12-14 ENCOUNTER — Ambulatory Visit: Payer: Medicare HMO | Admitting: Family Medicine

## 2019-12-18 ENCOUNTER — Telehealth: Payer: Self-pay

## 2019-12-18 ENCOUNTER — Telehealth: Payer: Self-pay | Admitting: Licensed Clinical Social Worker

## 2019-12-18 NOTE — Telephone Encounter (Signed)
  Chronic Care Management    Clinical Social Work General Follow Up Note  12/18/2019 Name: Katrina Sutton MRN: [id number] DOB: 08-14-1950  Katrina Sutton is a 69 y.o. year old female who is a primary care patient of Tarri Fuller, FNP. The CCM team was consulted for assistance with Level of Care Concerns.   Review of patient status, including review of consultants reports, relevant laboratory and other test results, and collaboration with appropriate care team members and the patient's provider was performed as part of comprehensive patient evaluation and provision of chronic care management services.    LCSW completed CCM outreach attempt today but was unable to reach patient successfully. A HIPPA compliant voice message was left encouraging patient to return call once available. LCSW will ask Scheduling Care Guide to reschedule CCM SW appointment with patient as well.   Outpatient Encounter Medications as of 12/18/2019  Medication Sig Note  . acetaminophen (TYLENOL) 500 MG tablet Take 500 mg by mouth every 8 (eight) hours as needed for headache.   . clonazePAM (KLONOPIN) 1 MG tablet TAKE 1 TABLET BY MOUTH TWICE A DAY AS NEEDED ANXIETY   . hydrocortisone cream 1 % Apply 1 application topically 2 (two) times daily as needed for itching.  06/19/2019: Very seldom   . linaclotide (LINZESS) 145 MCG CAPS capsule Take 1 capsule (145 mcg total) by mouth daily before breakfast.   . omeprazole (PRILOSEC) 40 MG capsule Take 1 capsule (40 mg total) by mouth daily. In morning   . PARoxetine (PAXIL) 40 MG tablet TAKE 1 TABLET BY MOUTH DAILY    No facility-administered encounter medications on file as of 12/18/2019.   Follow Up Plan: Scheduling Care Guide will reach out to patient to reschedule appointment.   Dickie La, BSW, MSW, LCSW [facility] [place]  [facility] [email or web address] Phone: [phone]

## 2019-12-25 ENCOUNTER — Ambulatory Visit: Payer: Medicare HMO | Admitting: Family Medicine

## 2020-01-01 ENCOUNTER — Other Ambulatory Visit: Payer: Self-pay | Admitting: Family Medicine

## 2020-01-01 DIAGNOSIS — F419 Anxiety disorder, unspecified: Secondary | ICD-10-CM

## 2020-01-01 NOTE — Telephone Encounter (Signed)
[place]ally [place], 01/01/20.  According to PMP aware, pt last received a refill on 12/04/2019.  Refill of her controlled substance will be provided today.

## 2020-01-01 NOTE — Telephone Encounter (Signed)
Requested medication (s) are due for refill today:yes  Requested medication (s) are on the active medication list: yes  Last refill:  11/02/19  #60  1 refill  Future visit scheduled: yes  Notes to clinic:  Not delegated    Requested Prescriptions  Pending Prescriptions Disp Refills   clonazePAM (KLONOPIN) 1 MG tablet [Pharmacy Med Name: CLONAZEPAM 1 MG TAB] 60 tablet     Sig: TAKE 1 TABLET BY MOUTH TWICE A DAY AS NEEDED FOR ANXIETY.      Not Delegated - Psychiatry:  Anxiolytics/Hypnotics Failed - 01/01/2020 12:25 PM      Failed - This refill cannot be delegated      Failed - Urine Drug Screen completed in last 360 days.      Passed - Valid encounter within last 6 months    Recent Outpatient Visits           4 weeks ago Essential hypertension   [facility], Jodelle Gross, FNP   1 month ago Hospital discharge follow-up   [facility], Jodelle Gross, FNP   2 months ago Constipation, unspecified constipation type   [facility], Jodelle Gross, FNP   3 months ago Essential hypertension   [facility], Jodelle Gross, FNP   4 months ago Hypertension, unspecified type   [facility], Jodelle Gross, FNP       Future Appointments             In 2 months [place], Jodelle Gross, FNP [facility], [facility]   In 5 months  [facility], [facility]

## 2020-01-02 ENCOUNTER — Telehealth: Payer: Self-pay

## 2020-01-02 NOTE — Chronic Care Management (AMB) (Signed)
  Care Management   Note  01/02/2020 Name: [name] MRN: [id number] DOB: 01-06-1951  Katrina Sutton is a 69 y.o. year old female who is a primary care patient of Tarri Fuller, FNP and is actively engaged with the care management team. I reached out to Mozelle T Pedro by phone today to assist with re-scheduling a follow up visit with the Licensed Clinical Social Worker  Follow up plan: Telephone appointment with care management team member scheduled for:01/17/2020  Penne Lash, RMA Care Guide, Embedded Care Coordination [facility]  [place], [place] [address] Direct Dial: [phone] [email or web address]: [email or web address]

## 2020-01-02 NOTE — Telephone Encounter (Signed)
Pt has been r/s  

## 2020-01-02 NOTE — Chronic Care Management (AMB) (Signed)
  Care Management   Note  01/02/2020 Name: Katrina Sutton MRN: [id number] DOB: December 27, 1950  [name] is a 70 y.o. year old female who is a primary care patient of Tarri Fuller, FNP and is actively engaged with the care management team. I reached out to [name] by phone today to assist with re-scheduling a follow up visit with the Licensed Clinical Social Worker  Follow up plan: Unsuccessful telephone outreach attempt made. A HIPAA compliant phone message was left for the patient providing contact information and requesting a return call.  The care management team will reach out to the patient again over the next 7 days.  If patient returns call to provider office, please advise to call Embedded Care Management Care Guide Penne Lash  at [phone]  Penne Lash, RMA Care Guide, Embedded Care Coordination [facility]  [place], [place] [address] Direct Dial: [phone] [email or web address]: [email or web address]

## 2020-01-03 ENCOUNTER — Telehealth: Payer: Self-pay

## 2020-01-14 ENCOUNTER — Telehealth: Payer: Self-pay | Admitting: General Practice

## 2020-01-14 ENCOUNTER — Telehealth: Payer: Self-pay

## 2020-01-14 NOTE — Telephone Encounter (Signed)
  Chronic Care Management   Outreach Note  01/14/2020 Name: Katrina Sutton MRN: [id number] DOB: [birth year]  Referred by: Tarri Fuller, FNP Reason for referral : Chronic Care Management (RNCM Follow up Call for Chronic Disease Managment and Care Coordination Needs)   An unsuccessful telephone outreach was attempted today. The patient was referred to the case management team for assistance with care management and care coordination. Left VM x 2.   Follow Up Plan: A HIPAA compliant phone message was left for the patient providing contact information and requesting a return call.   Alto Denver RN, MSN, CCM Community Care Coordinator [place]  [facility] [place] Mobile: [phone]

## 2020-01-17 ENCOUNTER — Ambulatory Visit: Payer: Medicare HMO | Admitting: Licensed Clinical Social Worker

## 2020-01-17 DIAGNOSIS — F418 Other specified anxiety disorders: Secondary | ICD-10-CM

## 2020-01-17 DIAGNOSIS — I10 Essential (primary) hypertension: Secondary | ICD-10-CM

## 2020-01-17 DIAGNOSIS — F419 Anxiety disorder, unspecified: Secondary | ICD-10-CM

## 2020-01-17 DIAGNOSIS — F319 Bipolar disorder, unspecified: Secondary | ICD-10-CM

## 2020-01-17 NOTE — Chronic Care Management (AMB) (Signed)
Chronic Care Management    Clinical Social Work Follow Up Note  01/17/2020 Name: Katrina Sutton MRN: [id number] DOB: [birth year]  Katrina Sutton is a 69 y.o. year old female who is a primary care patient of Tarri Fuller, FNP. The CCM team was consulted for assistance with Level of Care Concerns.   Review of patient status, including review of consultants reports, other relevant assessments, and collaboration with appropriate care team members and the patient's provider was performed as part of comprehensive patient evaluation and provision of chronic care management services.    SDOH (Social Determinants of Health) assessments performed: Yes    Outpatient Encounter Medications as of 01/17/2020  Medication Sig Note  . acetaminophen (TYLENOL) 500 MG tablet Take 500 mg by mouth every 8 (eight) hours as needed for headache.   . clonazePAM (KLONOPIN) 1 MG tablet TAKE 1 TABLET BY MOUTH TWICE A DAY AS NEEDED FOR ANXIETY.   . hydrocortisone cream 1 % Apply 1 application topically 2 (two) times daily as needed for itching.  06/19/2019: Very seldom   . linaclotide (LINZESS) 145 MCG CAPS capsule Take 1 capsule (145 mcg total) by mouth daily before breakfast.   . omeprazole (PRILOSEC) 40 MG capsule Take 1 capsule (40 mg total) by mouth daily. In morning   . PARoxetine (PAXIL) 40 MG tablet TAKE 1 TABLET BY MOUTH DAILY    No facility-administered encounter medications on file as of 01/17/2020.     Goals Addressed    .  SW- I would like to gain additional support to manage my anxiety better (pt-stated)        Current Barriers:  . Limited social support . Mental Health Concerns  . Social Isolation . Limited access to caregiver . Lacks knowledge of community resource: available mental health resources that she can utilize   Clinical Social Work Clinical Goal(s):  Marland Kitchen Over the next 120 days, client will work with SW to address concerns related to decreasing anxious symptoms by implementing additional mental  health support and coping skills into her routine.   Interventions: . Patient interviewed and appropriate assessments performed . Provided mental health counseling with regard daily difficulty with depression/isolation/mobility/weakness/balance. LCSW used active and reflective listening and implemented appropriate interventions to help suppport patient and her emotional needs. Advised patient to implement deep breathing/grounding/meditation/self-care exercises into her daily routine to combat stressors. Education provided.  . Discussed plans with patient for ongoing care management follow up and provided patient with direct contact information for care management team . Advised patient to implement deep breathing exercises into her routine whenever anxiety/stress arise to help bring her back to the present state.  . Assisted patient/caregiver with obtaining information about health plan benefits . Provided education and assistance to client regarding Advanced Directives. . Provided education to patient/caregiver regarding level of care options. . Encouraged patient to consider [facility] (mental health provider) for long term follow up and therapy/counseling. PCP made a referral in the past. LCSW provided patient with their contact information and encouraged her to contact them but patient declined services as she reports that her anxiety has improved during phone call on 01/17/20. Marland Kitchen Patient reports improvement with her bowels. . Patient reports that she has a strong social support network of family and friends. . Patient shares that her stomach issues trigger when anxiety arises. She continues to take stool softeners when needed. LCSW provided coping skill education for anxiety management. Patient reports that her anxiety is more tolerable now but  her depression gets to her at times. Depression management and coping skill education provided to patient today. Patient was receptive to  education provided. . Patient reports that she has been implementing more socialization into her routine which has been helpful. Patient reports that she goes to her favorite  restaurant to meet her friends once or twice a month. LCSW encouraged patient to implement additional self-care and socialization activities in order to improve her mood as she often stays inside at home throughout the day. Patient reports that she will start to get out of the house more often to run errands and get vitamin D.  . Praise provided as patient has put in a lot of work by challenging her negative thinking.  Positive reinforcement provided. Patient has been implementing healthy self care into her daily routine to cope with anxiety and depression. Patient reports that she has been taking care of several plants in her home which she is very proud of. Patient shares that she is keeping up with all of her responsibilities such as paying bills on time, eating healthy, taking medications, washing clothes, etc. . Patient's son is her primary caregiver and he lives in the home. He has some developmental disabilities and has never lived on her own but is very helpful per patient. Patient shares that he works 2 1/2 days per week.    . Patient shares that she received an injection in her knee by Dr. Odis Luster and it has helped with her mobility. She will get these injections every 3-4 months when needed.  . Patient denies any urgent concerns and reports that her SOB has improved. . Patient reports having a successful hernia replacement surgery. She stable transportation to follow up appointment for this on 11/20/19. Marland Kitchen Patient confirms that she is taking her medication as prescribed, eating more protein since her surgery and drinking more fluids in order to increase her overall self -care. Patient declines experiencing any recent nausea since medication was prescribed. Patient will take this medication when nausea symptoms arise.  . Patient  reports that she talks to her sister in law and son about her OCD as it gets triggered every year when it turns cold. Patient shares that her pain gets triggered by the cold as well. LCSW was educated on the "rubber band' method. She reports using this strategy in the past. When patient is experiencing obsessive thoughts she will pop her wrist to provide a distraction from that fixation. Cooing skill education provided specifically for OCD. Marland Kitchen Patient shares that she continues to drive herself to all appointments.   Patient Self Care Activities:  . Attends all scheduled provider appointments . Lacks social connections  Please see past updates related to this goal by clicking on the "Past Updates" button in the selected goal       Follow Up Plan: SW will follow up with patient by phone over the next quarter  Dickie La, [place], MSW, LCSW [facility]  [email or web address] Phone: [phone]

## 2020-01-28 ENCOUNTER — Ambulatory Visit: Payer: Self-pay

## 2020-01-28 ENCOUNTER — Telehealth: Payer: Self-pay

## 2020-01-28 NOTE — Telephone Encounter (Signed)
Pt stated she drank sugar free tea and then began having diarrhea.Onset was 2 days ago. Pt stated  Moderate diarrhea (6 stools per day) watery mixed with fragments of stool. No mucous or blood noted. Initially was having cramping 2 days ago but none since then. Pt is eating and drinking fluids and has no signs of dehydration. No abx use in the past 2 months.  No vomiting.  Care advice given and pt verbalized understanding.   Reason for Disposition . MILD-MODERATE diarrhea (e.g., 1-6 times / day more than normal)  Answer Assessment - Initial Assessment Questions 1. DIARRHEA SEVERITY: "How bad is the diarrhea?" "How many extra stools have you had in the past 24 hours than normal?"    - NO DIARRHEA (SCALE 0)   - MILD (SCALE 1-3): Few loose or mushy BMs; increase of 1-3 stools over normal daily number of stools; mild increase in ostomy output.   -  MODERATE (SCALE 4-7): Increase of 4-6 stools daily over normal; moderate increase in ostomy output. * SEVERE (SCALE 8-10; OR 'WORST POSSIBLE'): Increase of 7 or more stools daily over normal; moderate increase in ostomy output; incontinence.     moderate 2. ONSET: "When did the diarrhea begin?"      2 days ago 3. BM CONSISTENCY: "How loose or watery is the diarrhea?"      Mixed watery with fragments of stool 4. VOMITING: "Are you also vomiting?" If Yes, ask: "How many times in the past 24 hours?"      no 5. ABDOMINAL PAIN: "Are you having any abdominal pain?" If Yes, ask: "What does it feel like?" (e.g., crampy, dull, intermittent, constant)      Crampy 2 days ago none since 6. ABDOMINAL PAIN SEVERITY: If present, ask: "How bad is the pain?"  (e.g., Scale 1-10; mild, moderate, or severe)   - MILD (1-3): doesn't interfere with normal activities, abdomen soft and not tender to touch    - MODERATE (4-7): interferes with normal activities or awakens from sleep, tender to touch    - SEVERE (8-10): excruciating pain, doubled over, unable to do any normal  activities       n/a 7. ORAL INTAKE: If vomiting, "Have you been able to drink liquids?" "How much fluids have you had in the past 24 hours?"     yes 8. HYDRATION: "Any signs of dehydration?" (e.g., dry mouth [not just dry lips], too weak to stand, dizziness, new weight loss) "When did you last urinate?"     No-no 9. EXPOSURE: "Have you traveled to a foreign country recently?" "Have you been exposed to anyone with diarrhea?" "Could you have eaten any food that was spoiled?"    No-no-SF tea  10. ANTIBIOTIC USE: "Are you taking antibiotics now or have you taken antibiotics in the past 2 months?"       no 11. OTHER SYMPTOMS: "Do you have any other symptoms?" (e.g., fever, blood in stool)       no 12. PREGNANCY: "Is there any chance you are pregnant?" "When was your last menstrual period?"       n/a  Protocols used: DIARRHEA-A-AH

## 2020-01-28 NOTE — Telephone Encounter (Signed)
Called pt to schedule appt for diarrhea for 2-3 days and stomach cramping.  Will route result note to [facility] Nurse Triage for follow up when patient returns call to clinic. Nurse may give results to patient if they return call. CRM created for this message.    KP

## 2020-01-29 ENCOUNTER — Telehealth: Payer: Self-pay

## 2020-01-29 NOTE — Telephone Encounter (Signed)
Pt has been r/s. I just love her she is so sweet

## 2020-01-29 NOTE — Chronic Care Management (AMB) (Signed)
  Care Management   Note  01/29/2020 Name: Katrina Sutton MRN: [id number] DOB: 1951/01/14  Katrina Sutton is a 69 y.o. year old female who is a primary care patient of Tarri Fuller, FNP and is actively engaged with the care management team. I reached out to Cariah T Agard by phone today to assist with re-scheduling a follow up visit with the RN Case Manager  Follow up plan: Telephone appointment with care management team member scheduled for:03/10/2020  Penne Lash, RMA Care Guide, Embedded Care Coordination [facility]  [place], [place] [address] Direct Dial: [phone] [email or web address] Website: [email or web address]

## 2020-01-29 NOTE — Chronic Care Management (AMB) (Signed)
  Care Management   Note  01/29/2020 Name: Lihanna NELL GALES MRN: [id number] DOB: 04-Jan-1951  Shaquavia T Kirley is a 69 y.o. year old female who is a primary care patient of Tarri Fuller, FNP and is actively engaged with the care management team. I reached out to Breeonna T Mcinroy by phone today to assist with re-scheduling a follow up visit with the RN Case Manager  Follow up plan: Unsuccessful telephone outreach attempt made. A HIPAA compliant phone message was left for the patient providing contact information and requesting a return call.  The care management team will reach out to the patient again over the next 7 days.  If patient returns call to provider office, please advise to call Embedded Care Management Care Guide Penne Lash  at [phone]  Penne Lash, RMA Care Guide, Embedded Care Coordination [facility]  [place], [place] [address] Direct Dial: [phone] [email or web address] Website: [email or web address]

## 2020-01-30 ENCOUNTER — Telehealth: Payer: Self-pay

## 2020-01-30 NOTE — Telephone Encounter (Signed)
I left a detail message on the patient personal vm with the providers recommendation.

## 2020-01-30 NOTE — Telephone Encounter (Signed)
As per patient was going through stomach virus had diarrhea and ws taking crackers and toast which lasted for 7 days past week and now from past three days she did not had any BM, let me know if she will need appointment or she can go to urgent care please suggest ?

## 2020-01-30 NOTE — Telephone Encounter (Signed)
Copied from CRM [id number]. Topic: General - Other >> Jan 30, 2020 11:58 AM Herby Abraham C wrote: Reason for CRM: pt is calling in for assistance. Pt says that she received providers assistance because she was experiencing diarrhea, pt says now she is experiencing constipation. Pt says that she hasnt had a bowl movement in 3 days. Pt doesn't have any other complaints.   Pt would like further assistance on what could she try to help?   Please advise.

## 2020-01-30 NOTE — Telephone Encounter (Signed)
She can restart on the linzess or the mirilax and to increase her water intake. Likely constipation after the diarrhea with the increased carbohydrate intake.  If she is not having improvement with the linzess or the mirilax, can contact her GI provider for additional recommendations.  TY

## 2020-02-04 ENCOUNTER — Ambulatory Visit: Payer: Self-pay | Admitting: Licensed Clinical Social Worker

## 2020-02-04 NOTE — Chronic Care Management (AMB) (Signed)
°  Care Management   Follow Up Note   02/04/2020 Name: Katrina Sutton MRN: [id number] DOB: Jan 08, 1951  Referred by: Tarri Fuller, FNP Reason for referral : Care Coordination   [name] is a 69 y.o. year old female who is a primary care patient of Tarri Fuller, FNP. The care management team was consulted for assistance with care management and care coordination needs.    Review of patient status, including review of consultants reports, relevant laboratory and other test results, and collaboration with appropriate care team members and the patient's provider was performed as part of comprehensive patient evaluation and provision of chronic care management services.    Patient contacted CCM LCSW on 02/04/20 with questions regarding her upcoming appointments at [facility]. Patient and CCM LCSW rescheduled her 03/06/20 social work appointment to 02/27/20 as well. Case management and care coordination provided.   Dickie La, BSW, MSW, LCSW [facility] [place]   [facility] [email or web address] Phone: [phone]

## 2020-02-25 ENCOUNTER — Other Ambulatory Visit: Payer: Self-pay | Admitting: Family Medicine

## 2020-02-25 DIAGNOSIS — K219 Gastro-esophageal reflux disease without esophagitis: Secondary | ICD-10-CM

## 2020-02-25 DIAGNOSIS — F419 Anxiety disorder, unspecified: Secondary | ICD-10-CM

## 2020-02-25 NOTE — Telephone Encounter (Signed)
Requested Prescriptions  Pending Prescriptions Disp Refills   omeprazole (PRILOSEC) 40 MG capsule [Pharmacy Med Name: OMEPRAZOLE DR 40 MG CAP] 90 capsule 1    Sig: TAKE 1 CAPSULE BY MOUTH EVERY MORNING     Gastroenterology: Proton Pump Inhibitors Passed - 02/25/2020  3:25 PM      Passed - Valid encounter within last 12 months    Recent Outpatient Visits          2 months ago Essential hypertension   [facility], Jodelle Gross, FNP   3 months ago Hospital discharge follow-up   [facility], Jodelle Gross, FNP   4 months ago Constipation, unspecified constipation type   [facility], Jodelle Gross, FNP   5 months ago Essential hypertension   [facility], Jodelle Gross, FNP   5 months ago Hypertension, unspecified type   [facility], Jodelle Gross, FNP      Future Appointments            In 1 week [place], Jodelle Gross, FNP [facility], [facility]   In 4 months  [facility], [facility]

## 2020-02-27 ENCOUNTER — Ambulatory Visit: Payer: Medicare HMO | Admitting: Licensed Clinical Social Worker

## 2020-02-27 DIAGNOSIS — F419 Anxiety disorder, unspecified: Secondary | ICD-10-CM

## 2020-02-27 DIAGNOSIS — I10 Essential (primary) hypertension: Secondary | ICD-10-CM

## 2020-02-27 DIAGNOSIS — R0609 Other forms of dyspnea: Secondary | ICD-10-CM

## 2020-02-27 DIAGNOSIS — F418 Other specified anxiety disorders: Secondary | ICD-10-CM

## 2020-02-27 DIAGNOSIS — F319 Bipolar disorder, unspecified: Secondary | ICD-10-CM

## 2020-02-27 NOTE — Chronic Care Management (AMB) (Signed)
Chronic Care Management    Clinical Social Work Follow Up Note  02/27/2020 Name: Katrina Sutton MRN: [id number] DOB: [birth year]  Katrina Sutton is a 69 y.o. year old female who is a primary care patient of Tarri Fuller, FNP. The CCM team was consulted for assistance with Mental Health Counseling and Resources.   Review of patient status, including review of consultants reports, other relevant assessments, and collaboration with appropriate care team members and the patient's provider was performed as part of comprehensive patient evaluation and provision of chronic care management services.    SDOH (Social Determinants of Health) assessments performed: Yes    Outpatient Encounter Medications as of 02/27/2020  Medication Sig Note  . acetaminophen (TYLENOL) 500 MG tablet Take 500 mg by mouth every 8 (eight) hours as needed for headache.   . clonazePAM (KLONOPIN) 1 MG tablet TAKE 1 TABLET BY MOUTH TWICE A DAY AS NEEDED FOR ANXIETY.   . hydrocortisone cream 1 % Apply 1 application topically 2 (two) times daily as needed for itching.  06/19/2019: Very seldom   . linaclotide (LINZESS) 145 MCG CAPS capsule Take 1 capsule (145 mcg total) by mouth daily before breakfast.   . omeprazole (PRILOSEC) 40 MG capsule TAKE 1 CAPSULE BY MOUTH EVERY MORNING   . PARoxetine (PAXIL) 40 MG tablet TAKE 1 TABLET BY MOUTH DAILY    No facility-administered encounter medications on file as of 02/27/2020.     Goals Addressed    . SW-Track and Manage My Symptoms-Depression       Timeframe:  Long-Range Goal Priority:  Medium Start Date:  02/27/20                           Expected End Date:  05/27/20                     Follow Up Date - 90 days from 02/27/20   - avoid negative self-talk - develop a personal safety plan - develop a plan to deal with triggers like holidays, anniversaries - exercise at least 2 to 3 times per week - have a plan for how to handle bad days - journal feelings and what helps to feel  better or worse - spend time or talk with others at least 2 to 3 times per week - spend time or talk with others every day - watch for early signs of feeling worse - write in journal every day    Why is this important?    Keeping track of your progress will help your treatment team find the right mix of medicine and therapy for you.   Write in your journal every day.   Day-to-day changes in depression symptoms are normal. It may be more helpful to check your progress at the end of each week instead of every day.    Current Barriers:  . Limited social support . Mental Health Concerns  . Social Isolation . Limited access to caregiver . Lacks knowledge of community resource: available mental health resources that she can utilize   Clinical Social Work Clinical Goal(s):  Marland Kitchen Over the next 120 days, client will work with SW to address concerns related to decreasing anxious symptoms by implementing additional mental health support and coping skills into her routine.   Interventions: . Patient interviewed and appropriate assessments performed . Provided mental health counseling with regard daily difficulty with depression/isolation/mobility/weakness/balance. LCSW used active and reflective listening and implemented  appropriate interventions to help suppport patient and her emotional needs. Advised patient to implement deep breathing/grounding/meditation/self-care exercises into her daily routine to combat stressors. Education provided.  . Discussed plans with patient for ongoing care management follow up and provided patient with direct contact information for care management team . Advised patient to implement deep breathing exercises into her routine whenever anxiety/stress arise to help bring her back to the present state.  . Assisted patient/caregiver with obtaining information about health plan benefits . Provided education and assistance to client regarding Advanced Directives. . Provided  education to patient/caregiver regarding level of care options. . Encouraged patient to consider [facility] (mental health provider) for long term follow up and therapy/counseling. PCP made a referral in the past. LCSW provided patient with their contact information and encouraged her to contact them but patient declined services as she reports that her anxiety and depression has improved during phone call on 02/27/20. She is very thankful for this relief in these symptoms and has been actively implementing healthy coping skills into her daily routine to combat her negative thinking. Positive reinforcement provided for this success. . Patient reports improvement with her bowels. . Patient reports that she has a strong social support network of family and friends. . Patient shares that her stomach issues trigger when anxiety arises. She continues to take stool softeners when needed. LCSW provided coping skill education for anxiety management. Patient reports that her anxiety is more tolerable now but admits that her depression can be easily triggered. Depression management and coping skill education provided to patient today. Patient was receptive to education provided. . Patient reports that she has been implementing more socialization into her routine which has been helpful. Patient reports that she goes to her favorite restaurant to meet her friends once or twice a month. She reports that she went to breaskfast this morning with her son. Also, patient goes out to eat with her spouse weekly on Wednesdays. LCSW encouraged patient to implement additional self-care and socialization activities in order to improve her mood as she often stays inside at home throughout the day. Patient reports that she will start to get out of the house more often to run errands and get vitamin D. Patient has several projects that she works on at home. She reports that she is currently making Christmas Cards for her  family.  . Praise provided as patient has put in a lot of work by challenging her negative thinking.  Positive reinforcement provided. Patient has been implementing healthy self care into her daily routine to cope with anxiety and depression. Patient reports that she has been taking care of several plants in her home which she is very proud of. Patient shares that she is keeping up with all of her responsibilities such as paying bills on time, eating healthy, taking medications, washing clothes, etc. . Patient's son is her primary caregiver and he lives in the home. He has some developmental disabilities and has never lived on her own but is very helpful per patient. Patient shares that he works 2 1/2 days per week.  . Patient shares that she received an injection in her knee by Dr. Odis Luster and it has helped with her mobility. She will get these injections every 3-4 months when needed.  . Patient denies any urgent concerns and reports that her SOB has improved. . Patient reports having a successful hernia replacement surgery. She stable transportation to follow up appointment for this on 11/20/19. Marland Kitchen Patient confirms  that she is taking her medication as prescribed, eating more protein since her surgery and drinking more fluids in order to increase her overall self -care. Patient declines experiencing any recent nausea since medication was prescribed. Patient will take this medication when nausea symptoms arise.  . Patient reports that her cousin is a great support to her and she considers her weekly conversations with her as her very own therapy. Patient reports that she talks to her sister in law, cousin and son about her OCD as it gets triggered every year when it turns cold. Patient shares that her pain gets triggered by the cold as well. LCSW was educated on the "rubber band' method. She reports using this strategy in the past. When patient is experiencing obsessive thoughts she will pop her wrist to provide  a distraction from that fixation. Cooing skill education provided specifically for OCD. Marland Kitchen Patient shares that she continues to drive herself to all appointments.   Patient Self Care Activities:  . Attends all scheduled provider appointments . Lacks social connections  Please see past updates related to this goal by clicking on the "Past Updates" button in the selected goal        Follow Up Plan: SW will follow up with patient by phone over the next quarter  Dickie La, [place], MSW, LCSW [facility]  [facility] [email or web address] Phone: [phone]

## 2020-03-06 ENCOUNTER — Ambulatory Visit (INDEPENDENT_AMBULATORY_CARE_PROVIDER_SITE_OTHER): Payer: Medicare HMO | Admitting: Family Medicine

## 2020-03-06 ENCOUNTER — Other Ambulatory Visit: Payer: Self-pay

## 2020-03-06 ENCOUNTER — Ambulatory Visit: Payer: Self-pay | Admitting: Licensed Clinical Social Worker

## 2020-03-06 ENCOUNTER — Encounter: Payer: Self-pay | Admitting: Family Medicine

## 2020-03-06 ENCOUNTER — Ambulatory Visit: Payer: Self-pay

## 2020-03-06 VITALS — BP 123/75 | HR 75 | Temp 97.7°F | Resp 18 | Ht 66.0 in | Wt 151.2 lb

## 2020-03-06 DIAGNOSIS — F418 Other specified anxiety disorders: Secondary | ICD-10-CM | POA: Diagnosis not present

## 2020-03-06 DIAGNOSIS — F419 Anxiety disorder, unspecified: Secondary | ICD-10-CM | POA: Diagnosis not present

## 2020-03-06 DIAGNOSIS — K219 Gastro-esophageal reflux disease without esophagitis: Secondary | ICD-10-CM | POA: Diagnosis not present

## 2020-03-06 MED ORDER — PAROXETINE HCL 40 MG PO TABS: 40.0000 mg | ORAL_TABLET | Freq: Every day | ORAL | 1 refills | Status: DC

## 2020-03-06 MED ORDER — OMEPRAZOLE 40 MG PO CPDR: 40.0000 mg | DELAYED_RELEASE_CAPSULE | Freq: Every morning | ORAL | 1 refills | Status: DC

## 2020-03-06 NOTE — Patient Instructions (Signed)
Continue all medications as directed  We will plan to see you back in 6 months for anxiety/depression follow up  You will receive a survey after today's visit either digitally by e-mail or paper by USPS mail. Your experiences and feedback matter to [place].  Please respond so we know how we are doing as we provide care for you.  Call us with any questions/concerns/needs.  It is my goal to be available to you for your health concerns.  Thanks for choosing me to be a partner in your healthcare needs!  Charlaine Dalton, FNP-C Family Nurse Practitioner [facility] Phone: [phone]

## 2020-03-06 NOTE — Assessment & Plan Note (Signed)
See Anxiety A/P 

## 2020-03-06 NOTE — Assessment & Plan Note (Signed)
Currently well controlled on omeprazole 40mg once daily.  Plan: 1. Continue omeprazole 40mg once daily. Side effects discussed. Pt wants to continue med. 2. Avoid diet triggers. Reviewed need to seek care if globus sensation, difficulty swallowing, s/sx of GI bleed. 3. Follow up as needed and in 6 months.  

## 2020-03-06 NOTE — Assessment & Plan Note (Signed)
PHQ9-3/GAD7-2.  Presently well controlled with current treatment plan of paroxetine 40mg  daily and clonazepam 1mg  BID PRN for anxiety.  Will continue.  Refills sent to pharmacy on file.

## 2020-03-06 NOTE — Chronic Care Management (AMB) (Signed)
  Care Management   Follow Up Note   03/06/2020 Name: Cindee YARETZY OLAZABAL MRN: [id number] DOB: Sep 06, 1950  Referred by: Tarri Fuller, FNP Reason for referral : Care Coordination   Tatem T Chern is a 69 y.o. year old female who is a primary care patient of Tarri Fuller, FNP. The care management team was consulted for assistance with care management and care coordination needs.    Review of patient status, including review of consultants reports, relevant laboratory and other test results, and collaboration with appropriate care team members and the patient's provider was performed as part of comprehensive patient evaluation and provision of chronic care management services.    LCSW completed CCM outreach attempt today but was unable to reach patient successfully. Patient successfully completed office visit with PCP today. A HIPPA compliant voice message was left encouraging patient to return call once available. LCSW reschedule patient's CCM Social Work appointment if no return call has been made.  Dickie La, BSW, MSW, LCSW [facility]  [facility] [email or web address] Phone: [phone]

## 2020-03-06 NOTE — Progress Notes (Signed)
Subjective:    Patient ID: Katrina Sutton, female    DOB: 12-25-1950, 69 y.o.   MRN: [id number]  Katrina Sutton is a 69 y.o. female presenting on 03/06/2020 for Depression   HPI  Ms. Selke presents to clinic for medication refills on her medications for depression and GERD.  Reports feels that her depression and GERD are stable and well controlled with her current medication regimen.  Denies any acute concerns.  Depression screen [facility] 2/9 09/13/2019 08/30/2019 06/19/2019  Decreased Interest 0 0 0  Down, Depressed, Hopeless 0 1 0  PHQ - 2 Score 0 1 0  Altered sleeping 0 1 -  Tired, decreased energy 0 1 -  Change in appetite 0 0 -  Feeling bad or failure about yourself  0 1 -  Trouble concentrating 0 1 -  Moving slowly or fidgety/restless 0 0 -  Suicidal thoughts 0 0 -  PHQ-9 Score 0 5 -  Difficult doing work/chores Not difficult at all Not difficult at all -  Some recent data might be hidden    Social History   Tobacco Use  . Smoking status: Former Smoker    Quit date: 12/27/1976    Years since quitting: 43.2  . Smokeless tobacco: Never Used  Vaping Use  . Vaping Use: Never used  Substance Use Topics  . Alcohol use: No  . Drug use: No    Review of Systems  Constitutional: Negative.   HENT: Negative.   Eyes: Negative.   Respiratory: Negative.   Cardiovascular: Negative.   Gastrointestinal: Negative.        GERD  Endocrine: Negative.   Genitourinary: Negative.   Musculoskeletal: Negative.   Skin: Negative.   Allergic/Immunologic: Negative.   Neurological: Negative.   Hematological: Negative.   Psychiatric/Behavioral: Positive for dysphoric mood. Negative for agitation, behavioral problems, confusion, decreased concentration, hallucinations, self-injury, sleep disturbance and suicidal ideas. The patient is nervous/anxious. The patient is not hyperactive.    Per HPI unless specifically indicated above     Objective:    BP 123/75 (BP Location: Right Arm, Patient  Position: Sitting, Cuff Size: Normal)   Pulse 75   Temp 97.7 F (36.5 C) (Temporal)   Resp 18   Ht 5\' 6"  (1.676 m)   Wt 151 lb 3.2 oz (68.6 kg)   SpO2 97%   BMI 24.40 kg/m   Wt Readings from Last 3 Encounters:  03/06/20 151 lb 3.2 oz (68.6 kg)  12/03/19 151 lb 6.4 oz (68.7 kg)  11/20/19 150 lb 12.8 oz (68.4 kg)    Physical Exam Vitals and nursing note reviewed.  Constitutional:      General: She is not in acute distress.    Appearance: Normal appearance. She is well-developed, well-groomed and normal weight. She is not ill-appearing or toxic-appearing.  HENT:     Head: Normocephalic and atraumatic.     Nose:     Comments: 01/20/20 is in place, covering mouth and nose. Eyes:     General: Lids are normal. Vision grossly intact.        Right eye: No discharge.        Left eye: No discharge.     Extraocular Movements: Extraocular movements intact.     Conjunctiva/sclera: Conjunctivae normal.     Pupils: Pupils are equal, round, and reactive to light.  Cardiovascular:     Rate and Rhythm: Normal rate and regular rhythm.     Pulses: Normal pulses.     Heart sounds:  Normal heart sounds. No murmur heard. No friction rub. No gallop.   Pulmonary:     Effort: Pulmonary effort is normal. No respiratory distress.     Breath sounds: Normal breath sounds.  Skin:    General: Skin is warm and dry.     Capillary Refill: Capillary refill takes less than 2 seconds.  Neurological:     General: No focal deficit present.     Mental Status: She is alert and oriented to person, place, and time.  Psychiatric:        Attention and Perception: Attention and perception normal.        Mood and Affect: Mood and affect normal.        Speech: Speech normal.        Behavior: Behavior normal. Behavior is cooperative.        Thought Content: Thought content normal.        Cognition and Memory: Cognition and memory normal.        Judgment: Judgment normal.    Results for orders placed or performed  during the hospital encounter of 10/26/19  SARS Coronavirus 2 by RT PCR (hospital order, performed in [facility] hospital lab) Nasopharyngeal Nasopharyngeal Swab   Specimen: Nasopharyngeal Swab  Result Value Ref Range   SARS Coronavirus 2 NEGATIVE NEGATIVE  Surgical pcr screen   Specimen: Nasal Mucosa; Nasal Swab  Result Value Ref Range   MRSA, PCR NEGATIVE NEGATIVE   Staphylococcus aureus NEGATIVE NEGATIVE  CBC with Differential  Result Value Ref Range   WBC 22.2 (H) 4.0 - 10.5 K/uL   RBC 5.31 (H) 3.87 - 5.11 MIL/uL   Hemoglobin 16.4 (H) 12.0 - 15.0 g/dL   HCT 45.6 (H) 25.6 - 38.9 %   MCV 90.4 80.0 - 100.0 fL   MCH 30.9 26.0 - 34.0 pg   MCHC 34.2 30.0 - 36.0 g/dL   RDW 37.3 42.8 - 76.8 %   Platelets 442 (H) 150 - 400 K/uL   nRBC 0.0 0.0 - 0.2 %   Neutrophils Relative % 85 %   Neutro Abs 18.9 (H) 1.7 - 7.7 K/uL   Lymphocytes Relative 9 %   Lymphs Abs 1.9 0.7 - 4.0 K/uL   Monocytes Relative 5 %   Monocytes Absolute 1.1 (H) 0.1 - 1.0 K/uL   Eosinophils Relative 0 %   Eosinophils Absolute 0.0 0.0 - 0.5 K/uL   Basophils Relative 0 %   Basophils Absolute 0.1 0.0 - 0.1 K/uL   Immature Granulocytes 1 %   Abs Immature Granulocytes 0.11 (H) 0.00 - 0.07 K/uL  Comprehensive metabolic panel  Result Value Ref Range   Sodium 136 135 - 145 mmol/L   Potassium 3.1 (L) 3.5 - 5.1 mmol/L   Chloride 97 (L) 98 - 111 mmol/L   CO2 24 22 - 32 mmol/L   Glucose, Bld 200 (H) 70 - 99 mg/dL   BUN 7 (L) 8 - 23 mg/dL   Creatinine, Ser 1.15 (H) 0.44 - 1.00 mg/dL   Calcium 72.6 8.9 - 20.3 mg/dL   Total Protein 8.2 (H) 6.5 - 8.1 g/dL   Albumin 4.7 3.5 - 5.0 g/dL   AST 31 15 - 41 U/L   ALT 27 0 - 44 U/L   Alkaline Phosphatase 144 (H) 38 - 126 U/L   Total Bilirubin 0.9 0.3 - 1.2 mg/dL   GFR calc non Af Amer 37 (L) >60 mL/min   GFR calc Af Amer 43 (L) >60 mL/min   Anion  gap 15 5 - 15  Lactic acid, plasma  Result Value Ref Range   Lactic Acid, Venous 1.7 0.5 - 1.9 mmol/L  Lactic acid, plasma   Result Value Ref Range   Lactic Acid, Venous 0.9 0.5 - 1.9 mmol/L  Lipase, blood  Result Value Ref Range   Lipase 74 (H) 11 - 51 U/L  Comprehensive metabolic panel  Result Value Ref Range   Sodium 140 135 - 145 mmol/L   Potassium 3.0 (L) 3.5 - 5.1 mmol/L   Chloride 103 98 - 111 mmol/L   CO2 25 22 - 32 mmol/L   Glucose, Bld 135 (H) 70 - 99 mg/dL   BUN 7 (L) 8 - 23 mg/dL   Creatinine, Ser 9.621.16 (H) 0.44 - 1.00 mg/dL   Calcium 8.8 (L) 8.9 - 10.3 mg/dL   Total Protein 6.8 6.5 - 8.1 g/dL   Albumin 3.8 3.5 - 5.0 g/dL   AST 35 15 - 41 U/L   ALT 29 0 - 44 U/L   Alkaline Phosphatase 128 (H) 38 - 126 U/L   Total Bilirubin 0.9 0.3 - 1.2 mg/dL   GFR calc non Af Amer 47 (L) >60 mL/min   GFR calc Af Amer 55 (L) >60 mL/min   Anion gap 12 5 - 15  CBC  Result Value Ref Range   WBC 20.6 (H) 4.0 - 10.5 K/uL   RBC 4.77 3.87 - 5.11 MIL/uL   Hemoglobin 14.7 12.0 - 15.0 g/dL   HCT 95.242.2 84.136.0 - 32.446.0 %   MCV 88.5 80.0 - 100.0 fL   MCH 30.8 26.0 - 34.0 pg   MCHC 34.8 30.0 - 36.0 g/dL   RDW 40.113.2 02.711.5 - 25.315.5 %   Platelets 319 150 - 400 K/uL   nRBC 0.0 0.0 - 0.2 %  CBC with Differential/Platelet  Result Value Ref Range   WBC 10.2 4.0 - 10.5 K/uL   RBC 4.05 3.87 - 5.11 MIL/uL   Hemoglobin 12.2 12.0 - 15.0 g/dL   HCT 66.437.8 40.336.0 - 47.446.0 %   MCV 93.3 80.0 - 100.0 fL   MCH 30.1 26.0 - 34.0 pg   MCHC 32.3 30.0 - 36.0 g/dL   RDW 25.913.5 56.311.5 - 87.515.5 %   Platelets 263 150 - 400 K/uL   nRBC 0.0 0.0 - 0.2 %   Neutrophils Relative % 80 %   Neutro Abs 8.2 (H) 1.7 - 7.7 K/uL   Lymphocytes Relative 11 %   Lymphs Abs 1.1 0.7 - 4.0 K/uL   Monocytes Relative 8 %   Monocytes Absolute 0.8 0.1 - 1.0 K/uL   Eosinophils Relative 1 %   Eosinophils Absolute 0.1 0.0 - 0.5 K/uL   Basophils Relative 0 %   Basophils Absolute 0.0 0.0 - 0.1 K/uL   Immature Granulocytes 0 %   Abs Immature Granulocytes 0.02 0.00 - 0.07 K/uL  Basic metabolic panel  Result Value Ref Range   Sodium 146 (H) 135 - 145 mmol/L   Potassium 3.3  (L) 3.5 - 5.1 mmol/L   Chloride 108 98 - 111 mmol/L   CO2 28 22 - 32 mmol/L   Glucose, Bld 113 (H) 70 - 99 mg/dL   BUN 7 (L) 8 - 23 mg/dL   Creatinine, Ser 6.430.94 0.44 - 1.00 mg/dL   Calcium 8.6 (L) 8.9 - 10.3 mg/dL   GFR calc non Af Amer >60 >60 mL/min   GFR calc Af Amer >60 >60 mL/min   Anion gap 10 5 - 15  Magnesium  Result Value Ref Range   Magnesium 1.7 1.7 - 2.4 mg/dL  Phosphorus  Result Value Ref Range   Phosphorus 2.6 2.5 - 4.6 mg/dL  CBC with Differential/Platelet  Result Value Ref Range   WBC 12.5 (H) 4.0 - 10.5 K/uL   RBC 4.40 3.87 - 5.11 MIL/uL   Hemoglobin 13.5 12.0 - 15.0 g/dL   HCT 91.4 78.2 - 95.6 %   MCV 93.6 80.0 - 100.0 fL   MCH 30.7 26.0 - 34.0 pg   MCHC 32.8 30.0 - 36.0 g/dL   RDW 21.3 08.6 - 57.8 %   Platelets 276 150 - 400 K/uL   nRBC 0.0 0.0 - 0.2 %   Neutrophils Relative % 81 %   Neutro Abs 10.0 (H) 1.7 - 7.7 K/uL   Lymphocytes Relative 11 %   Lymphs Abs 1.4 0.7 - 4.0 K/uL   Monocytes Relative 7 %   Monocytes Absolute 0.9 0.1 - 1.0 K/uL   Eosinophils Relative 1 %   Eosinophils Absolute 0.1 0.0 - 0.5 K/uL   Basophils Relative 0 %   Basophils Absolute 0.0 0.0 - 0.1 K/uL   Immature Granulocytes 0 %   Abs Immature Granulocytes 0.04 0.00 - 0.07 K/uL  Basic metabolic panel  Result Value Ref Range   Sodium 143 135 - 145 mmol/L   Potassium 4.2 3.5 - 5.1 mmol/L   Chloride 107 98 - 111 mmol/L   CO2 25 22 - 32 mmol/L   Glucose, Bld 122 (H) 70 - 99 mg/dL   BUN 6 (L) 8 - 23 mg/dL   Creatinine, Ser 4.69 0.44 - 1.00 mg/dL   Calcium 9.0 8.9 - 62.9 mg/dL   GFR calc non Af Amer >60 >60 mL/min   GFR calc Af Amer >60 >60 mL/min   Anion gap 11 5 - 15  Magnesium  Result Value Ref Range   Magnesium 1.7 1.7 - 2.4 mg/dL  Basic metabolic panel  Result Value Ref Range   Sodium 140 135 - 145 mmol/L   Potassium 3.8 3.5 - 5.1 mmol/L   Chloride 105 98 - 111 mmol/L   CO2 26 22 - 32 mmol/L   Glucose, Bld 114 (H) 70 - 99 mg/dL   BUN 6 (L) 8 - 23 mg/dL    Creatinine, Ser 5.28 0.44 - 1.00 mg/dL   Calcium 8.9 8.9 - 41.3 mg/dL   GFR calc non Af Amer >60 >60 mL/min   GFR calc Af Amer >60 >60 mL/min   Anion gap 9 5 - 15  Magnesium  Result Value Ref Range   Magnesium 1.5 (L) 1.7 - 2.4 mg/dL  Magnesium  Result Value Ref Range   Magnesium 2.3 1.7 - 2.4 mg/dL  Basic metabolic panel  Result Value Ref Range   Sodium 141 135 - 145 mmol/L   Potassium 3.8 3.5 - 5.1 mmol/L   Chloride 105 98 - 111 mmol/L   CO2 24 22 - 32 mmol/L   Glucose, Bld 86 70 - 99 mg/dL   BUN 8 8 - 23 mg/dL   Creatinine, Ser 2.44 0.44 - 1.00 mg/dL   Calcium 9.4 8.9 - 01.0 mg/dL   GFR calc non Af Amer >60 >60 mL/min   GFR calc Af Amer >60 >60 mL/min   Anion gap 12 5 - 15  CBC  Result Value Ref Range   WBC 7.7 4.0 - 10.5 K/uL   RBC 4.57 3.87 - 5.11 MIL/uL   Hemoglobin 14.0 12.0 - 15.0 g/dL  HCT 41.5 36.0 - 46.0 %   MCV 90.8 80.0 - 100.0 fL   MCH 30.6 26.0 - 34.0 pg   MCHC 33.7 30.0 - 36.0 g/dL   RDW 16.1 09.6 - 04.5 %   Platelets 304 150 - 400 K/uL   nRBC 0.0 0.0 - 0.2 %  Basic metabolic panel  Result Value Ref Range   Sodium 142 135 - 145 mmol/L   Potassium 3.9 3.5 - 5.1 mmol/L   Chloride 106 98 - 111 mmol/L   CO2 26 22 - 32 mmol/L   Glucose, Bld 74 70 - 99 mg/dL   BUN 11 8 - 23 mg/dL   Creatinine, Ser 4.09 0.44 - 1.00 mg/dL   Calcium 9.1 8.9 - 81.1 mg/dL   GFR calc non Af Amer >60 >60 mL/min   GFR calc Af Amer >60 >60 mL/min   Anion gap 10 5 - 15  CBC  Result Value Ref Range   WBC 12.0 (H) 4.0 - 10.5 K/uL   RBC 4.61 3.87 - 5.11 MIL/uL   Hemoglobin 14.3 12.0 - 15.0 g/dL   HCT 91.4 78.2 - 95.6 %   MCV 89.2 80.0 - 100.0 fL   MCH 31.0 26.0 - 34.0 pg   MCHC 34.8 30.0 - 36.0 g/dL   RDW 21.3 08.6 - 57.8 %   Platelets 295 150 - 400 K/uL   nRBC 0.0 0.0 - 0.2 %  Magnesium  Result Value Ref Range   Magnesium 2.0 1.7 - 2.4 mg/dL      Assessment & Plan:   Problem List Items Addressed This Visit      Digestive   GERD (gastroesophageal reflux disease)     Currently well controlled on omeprazole 40mg  once daily.  Plan: 1. Continue omeprazole 40mg  once daily. Side effects discussed. Pt wants to continue med. 2. Avoid diet triggers. Reviewed need to seek care if globus sensation, difficulty swallowing, s/sx of GI bleed. 3. Follow up as needed and in 6 months.       Relevant Medications   omeprazole (PRILOSEC) 40 MG capsule     Other   Anxiety    PHQ9-3/GAD7-2.  Presently well controlled with current treatment plan of paroxetine 40mg  daily and clonazepam 1mg  BID PRN for anxiety.  Will continue.  Refills sent to pharmacy on file.      Relevant Medications   PARoxetine (PAXIL) 40 MG tablet   Depression with anxiety - Primary    See Anxiety AP      Relevant Medications   PARoxetine (PAXIL) 40 MG tablet      Meds ordered this encounter  Medications  . omeprazole (PRILOSEC) 40 MG capsule    Sig: Take 1 capsule (40 mg total) by mouth every morning.    Dispense:  90 capsule    Refill:  1  . PARoxetine (PAXIL) 40 MG tablet    Sig: Take 1 tablet (40 mg total) by mouth daily.    Dispense:  90 tablet    Refill:  1   Follow up plan: Return in about 6 months (around 09/04/2020) for Anxiety f/u.   , FNP Family Nurse Practitioner [facility] 03/06/2020, 1:58 PM

## 2020-03-10 ENCOUNTER — Ambulatory Visit: Payer: Self-pay | Admitting: General Practice

## 2020-03-10 ENCOUNTER — Telehealth: Payer: Self-pay | Admitting: General Practice

## 2020-03-10 ENCOUNTER — Telehealth: Payer: Medicare HMO | Admitting: General Practice

## 2020-03-10 DIAGNOSIS — F419 Anxiety disorder, unspecified: Secondary | ICD-10-CM

## 2020-03-10 DIAGNOSIS — I10 Essential (primary) hypertension: Secondary | ICD-10-CM

## 2020-03-10 DIAGNOSIS — F319 Bipolar disorder, unspecified: Secondary | ICD-10-CM

## 2020-03-10 DIAGNOSIS — F418 Other specified anxiety disorders: Secondary | ICD-10-CM

## 2020-03-10 NOTE — Patient Instructions (Signed)
Visit Information  Patient Care Plan: RNCM: Management of Depression/Anxiety/Bipolar Disorder (Adult)    Problem Identified: RNCM: Management of Chronic Conditions: Anxiety, Depression, Bipolar disorder   Priority: Medium    Goal: RNCM: Effective Management of Anxiety, Depression, and Bipolar   Start Date: 03/10/2020  Expected End Date: 07/08/2020  Priority: Medium  Note:   Current Barriers:   Knowledge Deficits related to management of depression, anxiety, and bipolar disorder  Care Coordination needs related to ongoing support and needs in a patient with behavioral health concerns (depression, anxiety, bipolar)  Chronic Disease Management support and education needs related to effective management of depression, anxiety, and bipolar disorder  Lacks caregiver support.   Unable to independently manage anxiety, depression, or bipolar disorder  Lacks social connections  Does not contact provider office for questions/concerns  Nurse Case Manager Clinical Goal(s):   Over the next 120 days, patient will verbalize understanding of plan for management of anxiety, depression, and bipolar disorder  Over the next 120 days, patient will work with RNCM, pcp , and CCM team to address needs related to exacerbations in anxiety, depression, and bipolar disorder  Over the next 120 days, patient will demonstrate a decrease in anxiety, depression, and bipolar disorder exacerbations as evidenced by stabilized mood and seeing providers as scheduled   Over the next 120 days, patient will attend all scheduled medical appointments: has follow up with the CCM team. Will schedule and appointment with the pcp as needed.   Over the next 120 days, patient will work with CM clinical social worker to currently working with the CCM team LCSW  Over the next 120 days, the patient will demonstrate ongoing self health care management ability as evidenced by effective management of anxiety, depression and bipolar  disorder  Interventions:   1:1 collaboration with Malfi, Lupita Raider, FNP regarding development and update of comprehensive plan of care as evidenced by provider attestation and co-signature  Inter-disciplinary care team collaboration (see longitudinal plan of care)  Evaluation of current treatment plan related to depression, anxiety, and bipolar disorder and patient's adherence to plan as established by provider.  Advised patient to call the provider for changes in condition or worsening sx/sx of depression, anxiety, and bipolar disorder  Provided education to patient re: maintaining health and well being, keeping balance in daily routine, practicing mindfulness as needed, taking medication as prescribed, and utilization of support system when she gets down or anxious  Reviewed medications with patient and discussed compliance, the patient endorses compliance with medicationa  Discussed plans with patient for ongoing care management follow up and provided patient with direct contact information for care management team  Provided patient with mindfulness educational materials related to helping when the patient gets stressed out or overwhelmed   - anxiety screen reviewed  - depression screen reviewed  Patient Goals/Self-Care Activities Over the next 120 days, patient will:  - Patient will self administer medications as prescribed Patient will attend all scheduled provider appointments Patient will call provider office for new concerns or questions Patient will work with BSW to address care coordination needs and will continue to work with the clinical team to address health care and disease management related needs.    Follow Up Plan: Telephone follow up appointment with care management team member scheduled for: 05-12-2020 at 10:30 am       Task: RNCM: Identify Depressive Symptoms and Facilitate Treatment   Note:   Care Management Activities:    - anxiety screen reviewed -  depression  screen reviewed       Patient Care Plan: RNCM: Care Plan for Hypertension (Adult)    Problem Identified: RNCM: Hypertension (Hypertension)   Priority: Medium    Goal: RNCM: HTN monitored and managed   Start Date: 03/10/2020  Priority: Medium  Note:   Objective:   Last practice recorded BP readings:  BP Readings from Last 3 Encounters:  03/06/20 123/75  12/03/19 129/76  11/20/19 (!) 146/88     Most recent eGFR/CrCl: No results found for: EGFR  No components found for: CRCL Current Barriers:   Knowledge Deficits related to basic understanding of hypertension pathophysiology and self care management  Limited Social Support  Unable to independently manage HTN   Does not contact provider office for questions/concerns Case Manager Clinical Goal(s):   Over the next 120 days, patient will verbalize understanding of plan for hypertension management  Over the next 120 days, patient will demonstrate improved adherence to prescribed treatment plan for hypertension as evidenced by taking all medications as prescribed, monitoring and recording blood pressure as directed, adhering to low sodium/DASH diet  Over the next 120 days, patient will demonstrate improved health management independence as evidenced by checking blood pressure as directed and notifying PCP if SBP>160 or DBP > 90, taking all medications as prescribe, and adhering to a low sodium diet as discussed.  Over the next 120 days, patient will verbalize basic understanding of hypertension disease process and self health management plan as evidenced by following recommended diet, normalized blood pressures, and routine visits to the pcp Interventions:   UNABLE to independently:manage HTN  Evaluation of current treatment plan related to hypertension self management and patient's adherence to plan as established by provider.  Provided education to patient re: stroke prevention, s/s of heart attack and stroke, DASH  diet, complications of uncontrolled blood pressure  Reviewed medications with patient and discussed importance of compliance  Discussed plans with patient for ongoing care management follow up and provided patient with direct contact information for care management team  Advised patient, providing education and rationale, to monitor blood pressure daily and record, calling PCP for findings outside established parameters. The patient states her blood pressures have been good. States systolic she is 813 to 887 and diastolic she is 70 to 80.  - anxiety screen reviewed  - depression screen reviewed Patient Goals/Self-Care Activities  Over the next 120 days, patient will:  - UNABLE to independently manage HTN Calls provider office for new concerns, questions, or BP outside discussed parameters Follows a low sodium diet/DASH diet Follow Up Plan: Telephone follow up appointment with care management team member scheduled for: 05-12-2020 at 10:30 am   Task: RNCM: Identify and Monitor Blood Pressure Elevation   Note:   Care Management Activities:    - blood pressure trends reviewed - depression screen reviewed - home or ambulatory blood pressure monitoring encouraged        The patient verbalized understanding of instructions, educational materials, and care plan provided today and declined offer to receive copy of patient instructions, educational materials, and care plan.   Telephone follow up appointment with care management team member scheduled for: 05-12-2020 at 10:30 am  [place], MSN, [place] Mobile: [phone]

## 2020-03-10 NOTE — Telephone Encounter (Signed)
  Chronic Care Management   Note  03/10/2020 Name: Katrina Sutton MRN: [id number] DOB: 10/01/1950  The patient called back.  See new encounter.   Follow up plan: Telephone follow up appointment with care management team member scheduled for: 05/12/2020 at 1030 am  Alto [place] RN, MSN, CCM Community Care Coordinator [facility]  [facility] [place] Mobile: [phone]

## 2020-03-10 NOTE — Chronic Care Management (AMB) (Signed)
Chronic Care Management   Follow Up Note   03/10/2020 Name: Katrina Sutton MRN: [id number] DOB: Dec 31, 1950  Referred by: Verl Bangs, FNP Reason for referral : Chronic Care Management (RNCM follow up for chronic Disease Management and Care Coordination )   Katrina Sutton is a 69 y.o. year old female who is a primary care patient of Verl Bangs, FNP. The CCM team was consulted for assistance with chronic disease management and care coordination needs.    Review of patient status, including review of consultants reports, relevant laboratory and other test results, and collaboration with appropriate care team members and the patient's provider was performed as part of comprehensive patient evaluation and provision of chronic care management services.    SDOH (Social Determinants of Health) assessments performed: Yes See Care Plan activities for detailed interventions related to [facility])     Outpatient Encounter Medications as of 03/10/2020  Medication Sig Note  . acetaminophen (TYLENOL) 500 MG tablet Take 500 mg by mouth every 8 (eight) hours as needed for headache.   . clonazePAM (KLONOPIN) 1 MG tablet TAKE 1 TABLET BY MOUTH TWICE A DAY AS NEEDED FOR ANXIETY.   . hydrocortisone cream 1 % Apply 1 application topically 2 (two) times daily as needed for itching.  06/19/2019: Very seldom   . linaclotide (LINZESS) 145 MCG CAPS capsule Take 1 capsule (145 mcg total) by mouth daily before breakfast.   . omeprazole (PRILOSEC) 40 MG capsule Take 1 capsule (40 mg total) by mouth every morning.   Marland Kitchen PARoxetine (PAXIL) 40 MG tablet Take 1 tablet (40 mg total) by mouth daily.    No facility-administered encounter medications on file as of 03/10/2020.     Objective:   Goals Addressed              This Visit's Progress   .  COMPLETED: RNCM-Pt-"I am taking a new medication for my blood pressure" (pt-stated)        CARE PLAN ENTRY (see longtitudinal plan of care for additional care plan  information)  Closing this goal and opening in new ELS Objective:  . Last practice recorded BP readings:  . BP Readings from Last 3 Encounters: .  11/05/19 . 140/71 .  11/01/19 . 134/71 .  10/25/19 . 130/68   . Most recent eGFR/CrCl: No results found for: EGFR  No components found for: CRCL  Current Barriers:  Marland Kitchen Knowledge Deficits related to basic understanding of hypertension pathophysiology and self care management . Knowledge Deficits related to understanding of medications prescribed for management of hypertension . Limited Social Support  Case Manager Clinical Goal(s):  Marland Kitchen Over the next 120 days, patient will verbalize understanding of plan for hypertension management . Over the next 120 days, patient will attend all scheduled medical appointments: next appointment with the pcp today 11-05-2019, next appointment on 12-03-2019 with the pcp. . Over the next 120 days, patient will demonstrate improved adherence to prescribed treatment plan for hypertension as evidenced by taking all medications as prescribed, monitoring and recording blood pressure as directed, adhering to low sodium/DASH diet . Over the next 120 days, patient will demonstrate improved health management independence as evidenced by checking blood pressure as directed and notifying PCP if SBP>130 or DBP > 80, taking all medications as prescribe, and adhering to a low sodium diet as discussed. o Recent blood pressure readings given to the [facility] by the patient: - 118/88- 08-31-2019 - 104/75, p-90-6-19-2021 - 103/69, p-81 09-05-2019 . Over the next  120 days, patient will verbalize basic understanding of hypertension disease process and self health management plan as evidenced by blood pressure controlled and no side effects noted from blood pressure regimen prescribed.   Interventions:  Marland Kitchen UNABLE to independently:manage HTN . Evaluation of current treatment plan related to hypertension self management and patient's adherence to  plan as established by provider. . Provided education to patient re: stroke prevention, s/s of heart attack and stroke, DASH diet, complications of uncontrolled blood pressure . Reviewed medications with patient and discussed importance of compliance. Education provided on the side effects of Lisinopril and to take medication as prescribed. The patient verbalized understanding. The patient also ask about a refill for her Klonopin. The patient states the pharmacy does not have the refill yet. Education provided that a message will be sent to the pcp for refill. 11-05-2019: The patient in the office today and Lisinopril decreased from 10 mg to 5 mg. The patient is checking blood pressures at home and recording. The patient states that she is feeling great post surgery for hernia repair. Parameters have been given by the pcp and the patient verbalized understanding.  . Discussed plans with patient for ongoing care management follow up and provided patient with direct contact information for care management team . Advised patient, providing education and rationale, to monitor blood pressure daily and record, calling PCP for findings outside established parameters. The patient endorses compliance with taking blood pressures and recording.  . Reviewed scheduled/upcoming provider appointments including: 11-05-2019 with pcp.  Next appointment with the pcp on 12-03-2019 at 1:40 pm  Patient Self Care Activities:  . UNABLE to independently manage HTN  Please see past updates related to this goal by clicking on the "Past Updates" button in the selected goal       .  COMPLETED: RNCM: PT-"I am doing the best I can managing my care" (pt-stated)        [place] (see longtitudinal plan of care for additional care plan information)  Current Barriers: completing this goal and opening a new care plan with new ELS . Chronic Disease Management support, education, and care coordination needs related to Anxiety,  Depression, Bipolar Disorder, and Paraesophageal hernia   Clinical Goal(s) related to Anxiety, Depression, Bipolar Disorder, and Paraesophageal hernia :  Over the next 120 days, patient will:  . Work with the care management team to address educational, disease management, and care coordination needs  . Begin or continue self health monitoring activities as directed today  follow a soft diet, sit up straight to prevent choking hazard, monitor for s/s of dehydration . Call provider office for new or worsened signs and symptoms Oxygen saturation lower than established parameter, Shortness of breath, and New or worsened symptom related to Anxiety/depression/bipolar disorder or complications from Paraesophageal hernia as evidence by recent hospitalization due to hypoxia  . Call care management team with questions or concerns . Verbalize basic understanding of patient centered plan of care established today  Interventions related to Anxiety, Depression, Bipolar Disorder, and Paraesophageal hernia :  . Evaluation of current treatment plans and patient's adherence to plan as established by provider- 11-05-2019: The patient seen in the office today by the pcp and RNCM for post discharge appointment and review of chronic conditions. The patient had umbilical hernia repair on 8-18 and is recovering well. She states she is taking Tylenol for pain. Has stronger medication if needed but has not had to take anything stronger. The patient verbalized understanding of discharge  instructions and how to care for surgical site. The patient follows up with the surgeon on 11-20-2019. 11-12-2019: The patient had left a VM for the LCSW asking about ice on her abdomen and bowel movements. The LCSW collaborated with the [facility]. A call was made to the patient by the [facility]. The patient was concerned if she was still needing to apply ice compresses to her abdomen. Education on using ice if she was having pain in her abdomen or for relief of  swelling.  The patient denies pain or discomfort. She saw the surgeon on Friday for follow up and the surgical site looks good. The patient also wanted to ask about bowel movements.  . Assessed patient understanding of disease states- 11-05-2019: The patient is doing well post surgical hernia repair. The patient verbalized she had been really sick prior to hospitalization but is feeling so much better. Site healing well. Sees surgeon for follow up in September. States some "runny" bowel movements. Education on possible changes in bowel habits post anesthesia events. The patient is following the recommendations of the provider. 11-12-2019: The patient verbalized she is not having a bowel movement every day. She endorses taking the miralax as directed. The patient also states that she is staying well hydrated. The patient denies hard, or difficult bowel movements.  Education on eating fruits and vegetables and also incorporating prunes or prune juice into her diet.  The patient denies constipation, but states she has occasional bloating. She is also passing gas. Education and support given.  . Assessed patient's education and care coordination needs.  Education on what worse looks like and to call for changes in condition. The patient verbalized that she is doing well and is happy that she has had the surgery to repair the umbilical hernia. 11-12-2019: Review of discharge instructions again. Extensive education on what worse looks like and to call the provider for fever, chills, purulent or smelly discharge, or any unusual changes at the surgical site. The patient denies any issues related her surgical site.  . Provided disease specific education to patient- education on eating foods that are soft and easy to swallow, education on sitting straight up when eating to prevent choking, education on staying hydrated and monitor for s/s of dehydration. The patient is drinking "plenty of water", also she is drinking gatorade  and Ensure at times. The patient states she is being careful when eating. The patient is managing her symptoms well during the day with positive changes in her diet. 11-12-2019: Ongoing support and review. The patient states that she is eating well and also drinking plenty of liquids.  Nash Dimmer with appropriate clinical care team members regarding patient needs: Ongoing support from LCSW for stress, anxiety, and depression. Pharmacy consult for recommendations and help with clarification of omeprazole dosage and recommendations.  11-05-2019- The patient continues to work with LCSW and pharmacist. Knows the CCM team is here to help with her health and wellness needs.  . Evaluation of depression and anxiety: The patient accompanied by her husband. The patient verbalized that she is feeling so much better and is happy to be home from the hospital.  Feels this was a much needed surgery and is happy with the results.Denies any discomfort. The patient encouraged to cough and deep breath to prevent any difficulties with pneumonia post hospitalization. Education on splinting her abdomen when she coughs to add extra support.  The patient is thankful for the support of the team.  Will continue to follow for  changes and needs.   Patient Self Care Activities related to Anxiety, Depression, Bipolar Disorder, and Paraesophageal hernia :  . Patient is unable to independently self-manage chronic health conditions  Please see past updates related to this goal by clicking on the "Past Updates" button in the selected goal         Patient Care Plan: RNCM: Management of Depression/Anxiety/Bipolar Disorder (Adult)    Problem Identified: RNCM: Management of Chronic Conditions: Anxiety, Depression, Bipolar disorder   Priority: Medium    Goal: RNCM: Effective Management of Anxiety, Depression, and Bipolar   Start Date: 03/10/2020  Expected End Date: 07/08/2020  Priority: Medium  Note:   Current Barriers:   Marland Kitchen Knowledge Deficits related to management of depression, anxiety, and bipolar disorder . Care Coordination needs related to ongoing support and needs in a patient with behavioral health concerns (depression, anxiety, bipolar) . Chronic Disease Management support and education needs related to effective management of depression, anxiety, and bipolar disorder . Lacks caregiver support.  . Unable to independently manage anxiety, depression, or bipolar disorder . Lacks social connections . Does not contact provider office for questions/concerns  Nurse Case Manager Clinical Goal(s):  Marland Kitchen Over the next 120 days, patient will verbalize understanding of plan for management of anxiety, depression, and bipolar disorder . Over the next 120 days, patient will work with RNCM, pcp , and CCM team to address needs related to exacerbations in anxiety, depression, and bipolar disorder . Over the next 120 days, patient will demonstrate a decrease in anxiety, depression, and bipolar disorder exacerbations as evidenced by stabilized mood and seeing providers as scheduled  . Over the next 120 days, patient will attend all scheduled medical appointments: has follow up with the CCM team. Will schedule and appointment with the pcp as needed.  . Over the next 120 days, patient will work with CM clinical social worker to currently working with the CCM team LCSW . Over the next 120 days, the patient will demonstrate ongoing self health care management ability as evidenced by effective management of anxiety, depression and bipolar disorder  Interventions:  . 1:1 collaboration with Malfi, Lupita Raider, FNP regarding development and update of comprehensive plan of care as evidenced by provider attestation and co-signature . Inter-disciplinary care team collaboration (see longitudinal plan of care) . Evaluation of current treatment plan related to depression, anxiety, and bipolar disorder and patient's adherence to plan as  established by provider. . Advised patient to call the provider for changes in condition or worsening sx/sx of depression, anxiety, and bipolar disorder . Provided education to patient re: maintaining health and well being, keeping balance in daily routine, practicing mindfulness as needed, taking medication as prescribed, and utilization of support system when she gets down or anxious . Reviewed medications with patient and discussed compliance, the patient endorses compliance with medicationa . Discussed plans with patient for ongoing care management follow up and provided patient with direct contact information for care management team . Provided patient with mindfulness educational materials related to helping when the patient gets stressed out or overwhelmed  . - anxiety screen reviewed . - depression screen reviewed  Patient Goals/Self-Care Activities Over the next 120 days, patient will:  - Patient will self administer medications as prescribed Patient will attend all scheduled provider appointments Patient will call provider office for new concerns or questions Patient will work with BSW to address care coordination needs and will continue to work with the clinical team to address health care and disease  management related needs.    Follow Up Plan: Telephone follow up appointment with care management team member scheduled for: 05-12-2020 at 10:30 am       Task: RNCM: Identify Depressive Symptoms and Facilitate Treatment   Note:   Care Management Activities:    - anxiety screen reviewed - depression screen reviewed       Patient Care Plan: RNCM: Care Plan for Hypertension (Adult)    Problem Identified: RNCM: Hypertension (Hypertension)   Priority: Medium    Goal: RNCM: HTN monitored and managed   Start Date: 03/10/2020  Priority: Medium  Note:   Objective:  . Last practice recorded BP readings:  BP Readings from Last 3 Encounters:  03/06/20 123/75  12/03/19 129/76   11/20/19 (!) 146/88 .   Marland Kitchen Most recent eGFR/CrCl: No results found for: EGFR  No components found for: CRCL Current Barriers:  Marland Kitchen Knowledge Deficits related to basic understanding of hypertension pathophysiology and self care management . Limited Social Support . Unable to independently manage HTN  . Does not contact provider office for questions/concerns Case Manager Clinical Goal(s):  Marland Kitchen Over the next 120 days, patient will verbalize understanding of plan for hypertension management . Over the next 120 days, patient will demonstrate improved adherence to prescribed treatment plan for hypertension as evidenced by taking all medications as prescribed, monitoring and recording blood pressure as directed, adhering to low sodium/DASH diet . Over the next 120 days, patient will demonstrate improved health management independence as evidenced by checking blood pressure as directed and notifying PCP if SBP>160 or DBP > 90, taking all medications as prescribe, and adhering to a low sodium diet as discussed. . Over the next 120 days, patient will verbalize basic understanding of hypertension disease process and self health management plan as evidenced by following recommended diet, normalized blood pressures, and routine visits to the pcp Interventions:  Marland Kitchen UNABLE to independently:manage HTN . Evaluation of current treatment plan related to hypertension self management and patient's adherence to plan as established by provider. . Provided education to patient re: stroke prevention, s/s of heart attack and stroke, DASH diet, complications of uncontrolled blood pressure . Reviewed medications with patient and discussed importance of compliance . Discussed plans with patient for ongoing care management follow up and provided patient with direct contact information for care management team . Advised patient, providing education and rationale, to monitor blood pressure daily and record, calling PCP for findings  outside established parameters. The patient states her blood pressures have been good. States systolic she is 824 to 175 and diastolic she is 70 to 80. . - anxiety screen reviewed . - depression screen reviewed Patient Goals/Self-Care Activities . Over the next 120 days, patient will:  - UNABLE to independently manage HTN Calls provider office for new concerns, questions, or BP outside discussed parameters Follows a low sodium diet/DASH diet Follow Up Plan: Telephone follow up appointment with care management team member scheduled for: 05-12-2020 at 10:30 am   Task: RNCM: Identify and Monitor Blood Pressure Elevation   Note:   Care Management Activities:    - blood pressure trends reviewed - depression screen reviewed - home or ambulatory blood pressure monitoring encouraged        Plan:   Telephone follow up appointment with care management team member scheduled for: 05-13-2019 at 10:30 am   [place], MSN, [place] Mobile: [phone]

## 2020-03-11 ENCOUNTER — Telehealth: Payer: Self-pay

## 2020-03-13 ENCOUNTER — Telehealth: Payer: Self-pay

## 2020-03-31 ENCOUNTER — Other Ambulatory Visit: Payer: Self-pay | Admitting: Family Medicine

## 2020-03-31 DIAGNOSIS — F419 Anxiety disorder, unspecified: Secondary | ICD-10-CM

## 2020-03-31 NOTE — Telephone Encounter (Signed)
Requested medication (s) are due for refill today - yes  Requested medication (s) are on the active medication list -yes  Future visit scheduled -yes  Last refill: 03/03/20  Notes to clinic: Request non delegated Rx  Requested Prescriptions  Pending Prescriptions Disp Refills   clonazePAM (KLONOPIN) 1 MG tablet [Pharmacy Med Name: CLONAZEPAM 1 MG TAB] 60 tablet     Sig: TAKE 1 TABLET BY MOUTH TWICE A DAY AS NEEDED FOR ANXIETY.      Not Delegated - Psychiatry:  Anxiolytics/Hypnotics Failed - 03/31/2020  1:03 PM      Failed - This refill cannot be delegated      Failed - Urine Drug Screen completed in last 360 days      Passed - Valid encounter within last 6 months    Recent Outpatient Visits           3 weeks ago Depression with anxiety   [facility], Jodelle Gross, FNP   3 months ago Essential hypertension   [facility], Jodelle Gross, FNP   4 months ago Hospital discharge follow-up   [facility], Jodelle Gross, FNP   5 months ago Constipation, unspecified constipation type   [facility], Jodelle Gross, FNP   6 months ago Essential hypertension   [facility], Jodelle Gross, FNP       Future Appointments             In 2 months [facility]                  Requested Prescriptions  Pending Prescriptions Disp Refills   clonazePAM (KLONOPIN) 1 MG tablet [Pharmacy Med Name: CLONAZEPAM 1 MG TAB] 60 tablet     Sig: TAKE 1 TABLET BY MOUTH TWICE A DAY AS NEEDED FOR ANXIETY.      Not Delegated - Psychiatry:  Anxiolytics/Hypnotics Failed - 03/31/2020  1:03 PM      Failed - This refill cannot be delegated      Failed - Urine Drug Screen completed in last 360 days      Passed - Valid encounter within last 6 months    Recent Outpatient Visits           3 weeks ago Depression with anxiety   [facility], Jodelle Gross, FNP   3 months ago  Essential hypertension   [facility], Jodelle Gross, FNP   4 months ago Hospital discharge follow-up   [facility], Jodelle Gross, FNP   5 months ago Constipation, unspecified constipation type   [facility], Jodelle Gross, FNP   6 months ago Essential hypertension   [facility], Jodelle Gross, FNP       Future Appointments             In 2 months [facility], [facility]

## 2020-04-01 NOTE — Telephone Encounter (Signed)
[place]ally [place], 04/01/20.  According to PMP aware, pt last received a refill on 01/01/2020.  Refill of her controlled substance will be provided today.

## 2020-04-03 ENCOUNTER — Ambulatory Visit: Payer: Self-pay | Admitting: Licensed Clinical Social Worker

## 2020-04-03 NOTE — Chronic Care Management (AMB) (Signed)
  Care Management   Follow Up Note   04/03/2020 Name: Katrina Sutton MRN: [id number] DOB: [birth year]   Referred by: Tarri Fuller, FNP Reason for referral : Care Coordination   Katrina Sutton is a 70 y.o. year old female who is a primary care patient of Tarri Fuller, FNP. The care management team was consulted for assistance with care management and care coordination needs.    Review of patient status, including review of consultants reports, relevant laboratory and other test results, and collaboration with appropriate care team members and the patient's provider was performed as part of comprehensive patient evaluation and provision of chronic care management services.    LCSW completed CCM outreach attempt today but was unable to reach patient successfully. A HIPPA compliant voice message was left encouraging patient to return call once available. LCSW will reschedule patient's CCM Social Work appointment to 04/08/20 if no return call has been made.   A HIPPA compliant phone message was left for the patient providing contact information and requesting a return call.   Dickie La, BSW, MSW, LCSW [facility] [place]  [facility] [email or web address] Phone: [phone]

## 2020-04-08 ENCOUNTER — Ambulatory Visit: Payer: Medicare HMO | Admitting: Licensed Clinical Social Worker

## 2020-04-08 DIAGNOSIS — F418 Other specified anxiety disorders: Secondary | ICD-10-CM

## 2020-04-08 DIAGNOSIS — F419 Anxiety disorder, unspecified: Secondary | ICD-10-CM

## 2020-04-08 DIAGNOSIS — F319 Bipolar disorder, unspecified: Secondary | ICD-10-CM

## 2020-04-08 DIAGNOSIS — I10 Essential (primary) hypertension: Secondary | ICD-10-CM

## 2020-04-08 NOTE — Chronic Care Management (AMB) (Signed)
Chronic Care Management    Clinical Social Work Note  04/08/2020 Name: Katrina Sutton MRN: [id number] DOB: Apr 16, 1950  Katrina Sutton is a 70 y.o. year old female who is a primary care patient of Verl Bangs, FNP. The CCM team was consulted to assist the patient with chronic disease management and/or care coordination needs related to: Level of Care Concerns and Mental Health Counseling and Resources.   Engaged with patient by telephone for follow up visit in response to provider referral for social work chronic care management and care coordination services.   Consent to Services:  The patient was given the following information about Chronic Care Management services today, agreed to services, and gave verbal consent: 1. CCM service includes personalized support from designated clinical staff supervised by the primary care provider, including individualized plan of care and coordination with other care providers 2. 24/7 contact phone numbers for assistance for urgent and routine care needs. 3. Service will only be billed when office clinical staff spend 20 minutes or more in a month to coordinate care. 4. Only one practitioner may furnish and bill the service in a calendar month. 5.The patient may stop CCM services at any time (effective at the end of the month) by phone call to the office staff. 6. The patient will be responsible for cost sharing (co-pay) of up to 20% of the service fee (after annual deductible is met). Patient agreed to services and consent obtained.  Patient agreed to services and consent obtained.   Assessment: Review of patient past medical history, allergies, medications, and health status, including review of relevant consultants reports was performed today as part of a comprehensive evaluation and provision of chronic care management and care coordination services.     SDOH (Social Determinants of Health) assessments and interventions performed:    Advanced Directives  Status: See Care Plan for related entries.  CCM Care Plan  Allergies  Allergen Reactions  . Erythromycin Shortness Of Breath and Rash  . Duloxetine Palpitations    Outpatient Encounter Medications as of 04/08/2020  Medication Sig Note  . acetaminophen (TYLENOL) 500 MG tablet Take 500 mg by mouth every 8 (eight) hours as needed for headache.   . clonazePAM (KLONOPIN) 1 MG tablet TAKE 1 TABLET BY MOUTH TWICE A DAY AS NEEDED FOR ANXIETY.   . hydrocortisone cream 1 % Apply 1 application topically 2 (two) times daily as needed for itching.  06/19/2019: Very seldom   . linaclotide (LINZESS) 145 MCG CAPS capsule Take 1 capsule (145 mcg total) by mouth daily before breakfast.   . omeprazole (PRILOSEC) 40 MG capsule Take 1 capsule (40 mg total) by mouth every morning.   Marland Kitchen PARoxetine (PAXIL) 40 MG tablet Take 1 tablet (40 mg total) by mouth daily.    No facility-administered encounter medications on file as of 04/08/2020.    Patient Active Problem List   Diagnosis Date Noted  . Intestinal obstruction due to recurrent umbilical hernia 04/25/1550  . SBO (small bowel obstruction) ([place]) 10/26/2019  . Recurrent ventral hernia 10/26/2019  . AKI (acute kidney injury) ([place]) 10/26/2019  . Hospital discharge follow-up 10/25/2019  . Hypokalemia 10/25/2019  . Hypertension 08/30/2019  . Leg swelling 05/08/2019  . Acute respiratory failure with hypoxia ([place]) 05/08/2019  . Paraesophageal hernia 05/08/2019  . Constipation 04/17/2019  . S/P umbilical hernia repair, follow-up exam 06/21/2018  . S/P laparoscopic cholecystectomy 05/31/2018  . Gallstone pancreatitis   . Umbilical hernia   . Dyspnea 03/11/2018  .  Bipolar 1 disorder ([place]) 08/23/2017  . GERD (gastroesophageal reflux disease) 12/27/2016  . Arthritis 12/27/2016  . Anxiety 12/27/2016  . Depression with anxiety 12/27/2016  . Seasonal allergic rhinitis 12/27/2016    Conditions to be addressed/monitored: Anxiety and Depression; Mental Health  Concerns  and Social Isolation  Care Plan : General Social Work (Adult)  Updates made by Greg Cutter, LCSW since 04/08/2020 12:00 AM    Problem: Coping Skills (General Plan of Care)     Long-Range Goal: Coping Skills Enhanced   Start Date: 04/08/2020  This Visit's Progress: On track  Priority: Medium  Note:   Evidence-based guidance:   Acknowledge, normalize and validate difficulty of making life-long lifestyle changes.   Identify current effective and ineffective coping strategies.   Encourage patient and caregiver participation in care to increase self-esteem, confidence and feelings of control.   Consider alternative and complementary therapy approaches such as meditation, mindfulness or yoga.   Encourage participation in cognitive behavioral therapy to foster a positive identity, increase self-awareness, as well as bolster self-esteem, confidence and self-efficacy.   Discuss spirituality; be present as concerns are identified; encourage journaling, prayer, worship services, meditation or pastoral counseling.   Encourage participation in pleasurable group activities such as hobbies, singing, sports or volunteering).   Encourage the use of mindfulness; refer for training or intensive intervention.   Consider the use of meditative movement therapy such as tai chi, yoga or qigong.   Promote a regular daily exercise program based on tolerance, ability and patient choice to support positive thinking about disease or aging.   Notes:   Timeframe:  Long-Range Goal Priority:  Medium Start Date:  02/27/20  , goal revisited on 04/08/20                         Expected End Date:  06/06/20                     Follow Up Date - 90 days from 04/08/20   - avoid negative self-talk - develop a personal safety plan - develop a plan to deal with triggers like holidays, anniversaries - exercise at least 2 to 3 times per week - have a plan for how to handle bad days - journal feelings and  what helps to feel better or worse - spend time or talk with others at least 2 to 3 times per week - spend time or talk with others every day - watch for early signs of feeling worse - write in journal every day    Why is this important?    Keeping track of your progress will help your treatment team find the right mix of medicine and therapy for you.   Write in your journal every day.   Day-to-day changes in depression symptoms are normal. It may be more helpful to check your progress at the end of each week instead of every day.    Current Barriers:  . Limited social support . Mental Health Concerns  . Social Isolation . Limited access to caregiver . Lacks knowledge of community resource: available mental health resources that she can utilize   Clinical Social Work Clinical Goal(s):  Marland Kitchen Over the next 120 days, client will work with SW to address concerns related to decreasing anxious symptoms by implementing additional mental health support and coping skills into her routine.   Interventions: . Patient interviewed and appropriate assessments performed . Patient reports that her anxiety  has increased due to recent isolation. Patient was provided coping skill education on anxiety management. Patient was educated on healthy self-care tools to implement into her daily routine. Patient reports that she is able to swallow better now.  . Provided mental health counseling with regard daily difficulty with depression/isolation/anxiety/mobility/weakness/balance. LCSW used active and reflective listening and implemented appropriate interventions to help suppport patient and her emotional needs. Advised patient to implement deep breathing/grounding/meditation/self-care exercises into her daily routine to combat stressors. Education provided.  . Discussed plans with patient for ongoing care management follow up and provided patient with direct contact information for care management team . Advised  patient to implement deep breathing exercises into her routine whenever anxiety/stress arise to help bring her back to the present state.  . Assisted patient/caregiver with obtaining information about health plan benefits . Provided education and assistance to client regarding Advanced Directives. . Provided education to patient/caregiver regarding level of care options. . Encouraged patient to consider [facility] (mental health provider) for long term follow up and therapy/counseling. PCP made a referral in the past. LCSW provided patient with their contact information and encouraged her to contact them but patient declined services as she reports that her anxiety and depression has improved during phone call on 02/27/20. She is very thankful for this relief in these symptoms and has been actively implementing healthy coping skills into her daily routine to combat her negative thinking. Positive reinforcement provided for this success. . Patient reports improvement with her bowels. . Patient reports that she has a strong social support network of family and friends. . Patient shares that her stomach issues trigger when anxiety arises. She continues to take stool softeners when needed. LCSW provided coping skill education for anxiety management. Patient reports that her anxiety is more tolerable now but admits that her depression can be easily triggered. Depression management and coping skill education provided to patient today. Patient was receptive to education provided. . Patient reports that she has been implementing more socialization into her routine which has been helpful. Patient reports that she goes to her favorite restaurant to meet her friends once or twice a month. She reports that she went to breaskfast this morning with her son. Also, patient goes out to eat with her spouse weekly on Wednesdays. LCSW encouraged patient to implement additional self-care and socialization  activities in order to improve her mood as she often stays inside at home throughout the day. Patient reports that she will start to get out of the house more often to run errands and get vitamin D. Patient has several projects that she works on at home. She reports that she made Christmas Cards for her family this year.  . Praise provided as patient has put in a lot of work by challenging her negative thinking.  Positive reinforcement provided. Patient has been implementing healthy self care into her daily routine to cope with anxiety and depression. Patient reports that she has been taking care of several plants in her home which she is very proud of. Patient shares that she is keeping up with all of her responsibilities such as paying bills on time, eating healthy, taking medications, washing clothes, etc. . Patient's son is her primary caregiver and he lives in the home. He has some developmental disabilities and has never lived on her own but is very helpful per patient. Patient shares that he works 2 1/2 days per week.  . Patient shares that she received an injection in her  knee by Dr. Harlow Mares and it has helped with her mobility. She will get these injections every 3-4 months when needed.  . Patient denies any urgent concerns and reports that her SOB has improved. . Patient reports having a successful hernia replacement surgery. Patient continues to keep a close eye on this recovery process.  . Patient confirms that she is taking her medication as prescribed, eating more protein since her surgery and drinking more fluids in order to increase her overall self -care. Patient declines experiencing any recent nausea since medication was prescribed. Patient will take this medication when nausea symptoms arise.  . Patient reports that her cousin is a great support to her and she considers her weekly conversations with her as her very own therapy. Patient reports that she talks to her sister in law, cousin and  son about her OCD as it gets triggered every year when it turns cold. Patient shares that her pain gets triggered by the cold as well. LCSW was educated on the "rubber band' method. She reports using this strategy in the past. When patient is experiencing obsessive thoughts she will pop her wrist to provide a distraction from that fixation. Cooing skill education provided specifically for OCD. Marland Kitchen Patient shares that she continues to drive herself to all appointments.   Patient Self Care Activities:  . Attends all scheduled provider appointments . Lacks social connections  Please see past updates related to this goal by clicking on the "Past Updates" button in the selected goal     Task: Support Psychosocial Response to Risk or Actual Health Condition   Note:   Care Management Activities:    - active listening utilized - counseling provided - current coping strategies identified - decision-making supported - healthy lifestyle promoted - journaling promoted - meditative movement therapy encouraged - mindfulness encouraged - participation in counseling encouraged - problem-solving facilitated - relaxation techniques promoted - self-reflection promoted - spiritual activities promoted - verbalization of feelings encouraged    Notes:       Follow Up Plan: SW will follow up with patient by phone over the next quarter      Eula Fried, [place], MSW, [email or web address] Phone: [phone]

## 2020-04-15 ENCOUNTER — Ambulatory Visit (INDEPENDENT_AMBULATORY_CARE_PROVIDER_SITE_OTHER): Payer: Medicare HMO | Admitting: Licensed Clinical Social Worker

## 2020-04-15 DIAGNOSIS — I10 Essential (primary) hypertension: Secondary | ICD-10-CM

## 2020-04-15 DIAGNOSIS — R0600 Dyspnea, unspecified: Secondary | ICD-10-CM

## 2020-04-15 DIAGNOSIS — R0609 Other forms of dyspnea: Secondary | ICD-10-CM

## 2020-04-15 DIAGNOSIS — F319 Bipolar disorder, unspecified: Secondary | ICD-10-CM

## 2020-04-15 DIAGNOSIS — K219 Gastro-esophageal reflux disease without esophagitis: Secondary | ICD-10-CM

## 2020-04-15 DIAGNOSIS — F418 Other specified anxiety disorders: Secondary | ICD-10-CM

## 2020-04-15 DIAGNOSIS — F419 Anxiety disorder, unspecified: Secondary | ICD-10-CM

## 2020-04-15 NOTE — Chronic Care Management (AMB) (Signed)
Chronic Care Management    Clinical Social Work Note  04/15/2020 Name: Katrina Sutton MRN: [id number] DOB: 1950-07-14  Katrina Sutton is a 70 y.o. year old female who is a primary care patient of Verl Bangs, FNP. The CCM team was consulted to assist the patient with chronic disease management and/or care coordination needs related to: [employer]  and [employer].   Engaged with patient by telephone for follow up visit in response to provider referral for social work chronic care management and care coordination services.   Consent to Services:  The patient was given the following information about Chronic Care Management services today, agreed to services, and gave verbal consent: 1. CCM service includes personalized support from designated clinical staff supervised by the primary care provider, including individualized plan of care and coordination with other care providers 2. 24/7 contact phone numbers for assistance for urgent and routine care needs. 3. Service will only be billed when office clinical staff spend 20 minutes or more in a month to coordinate care. 4. Only one practitioner may furnish and bill the service in a calendar month. 5.The patient may stop CCM services at any time (effective at the end of the month) by phone call to the office staff. 6. The patient will be responsible for cost sharing (co-pay) of up to 20% of the service fee (after annual deductible is met). Patient agreed to services and consent obtained.  Patient agreed to services and consent obtained.   Assessment: Review of patient past medical history, allergies, medications, and health status, including review of relevant consultants reports was performed today as part of a comprehensive evaluation and provision of chronic care management and care coordination services.     SDOH (Social Determinants of Health) assessments and interventions performed:    Advanced Directives Status:  See Care Plan for related entries.  CCM Care Plan  Allergies  Allergen Reactions  . Erythromycin Shortness Of Breath and Rash  . Duloxetine Palpitations    Outpatient Encounter Medications as of 04/15/2020  Medication Sig Note  . acetaminophen (TYLENOL) 500 MG tablet Take 500 mg by mouth every 8 (eight) hours as needed for headache.   . clonazePAM (KLONOPIN) 1 MG tablet TAKE 1 TABLET BY MOUTH TWICE A DAY AS NEEDED FOR ANXIETY.   . hydrocortisone cream 1 % Apply 1 application topically 2 (two) times daily as needed for itching.  06/19/2019: Very seldom   . linaclotide (LINZESS) 145 MCG CAPS capsule Take 1 capsule (145 mcg total) by mouth daily before breakfast.   . omeprazole (PRILOSEC) 40 MG capsule Take 1 capsule (40 mg total) by mouth every morning.   Marland Kitchen PARoxetine (PAXIL) 40 MG tablet Take 1 tablet (40 mg total) by mouth daily.    No facility-administered encounter medications on file as of 04/15/2020.    Patient Active Problem List   Diagnosis Date Noted  . Intestinal obstruction due to recurrent umbilical hernia 03/83/3383  . SBO (small bowel obstruction) ([place]) 10/26/2019  . Recurrent ventral hernia 10/26/2019  . AKI (acute kidney injury) ([place]) 10/26/2019  . Hospital discharge follow-up 10/25/2019  . Hypokalemia 10/25/2019  . Hypertension 08/30/2019  . Leg swelling 05/08/2019  . Acute respiratory failure with hypoxia ([place]) 05/08/2019  . Paraesophageal hernia 05/08/2019  . Constipation 04/17/2019  . S/P umbilical hernia repair, follow-up exam 06/21/2018  . S/P laparoscopic cholecystectomy 05/31/2018  . Gallstone pancreatitis   . Umbilical hernia   . Dyspnea 03/11/2018  . Bipolar  1 disorder ([place]) 08/23/2017  . GERD (gastroesophageal reflux disease) 12/27/2016  . Arthritis 12/27/2016  . Anxiety 12/27/2016  . Depression with anxiety 12/27/2016  . Seasonal allergic rhinitis 12/27/2016    Conditions to be addressed/monitored: Anxiety and Depression; Mental Health Concerns    Care Plan : General Social Work (Adult)  Updates made by Greg Cutter, LCSW since 04/15/2020 12:00 AM    Problem: Coping Skills (General Plan of Care)     Long-Range Goal: Coping Skills Enhanced   Start Date: 04/08/2020  Recent Progress: On track  Priority: Medium  Note:   Evidence-based guidance:   Acknowledge, normalize and validate difficulty of making life-long lifestyle changes.   Identify current effective and ineffective coping strategies.   Encourage patient and caregiver participation in care to increase self-esteem, confidence and feelings of control.   Consider alternative and complementary therapy approaches such as meditation, mindfulness or yoga.   Encourage participation in cognitive behavioral therapy to foster a positive identity, increase self-awareness, as well as bolster self-esteem, confidence and self-efficacy.   Discuss spirituality; be present as concerns are identified; encourage journaling, prayer, worship services, meditation or pastoral counseling.   Encourage participation in pleasurable group activities such as hobbies, singing, sports or volunteering).   Encourage the use of mindfulness; refer for training or intensive intervention.   Consider the use of meditative movement therapy such as tai chi, yoga or qigong.   Promote a regular daily exercise program based on tolerance, ability and patient choice to support positive thinking about disease or aging.   Notes:   Timeframe:  Long-Range Goal Priority:  Medium Start Date:  02/27/20  , goal revisited on 04/15/20                     Expected End Date:  06/06/20                     Follow Up Date - 30 days from 04/15/20   - avoid negative self-talk - develop a personal safety plan - develop a plan to deal with triggers like holidays, anniversaries - exercise at least 2 to 3 times per week - have a plan for how to handle bad days - journal feelings and what helps to feel better or worse - spend  time or talk with others at least 2 to 3 times per week - spend time or talk with others every day - watch for early signs of feeling worse - write in journal every day    Why is this important?    Keeping track of your progress will help your treatment team find the right mix of medicine and therapy for you.   Write in your journal every day.   Day-to-day changes in depression symptoms are normal. It may be more helpful to check your progress at the end of each week instead of every day.    Current Barriers:  . Limited social support . Mental Health Concerns  . Social Isolation . Limited access to caregiver . Lacks knowledge of community resource: available mental health resources that she can utilize   Clinical Social Work Clinical Goal(s):  Marland Kitchen Over the next 120 days, client will work with SW to address concerns related to decreasing anxious symptoms by implementing additional mental health support and coping skills into her routine.   Interventions: . Patient interviewed and appropriate assessments performed . Patient left a message with CCM LCSW on 04/14/20 and LCSW completed return call  on 04/15/20. Patient was interested in Pharmacist, hospital education. CCM LCSW provided education on local senior centers that offer daily activities and socialization such as [facility], [facility] and [facility]. CCM LCSW encouraged patient to contact [facility] to gain additional resource assistance and support.  . Patient reports that her anxiety has increased due to recent isolation. Patient was provided coping skill education on anxiety management. Patient was educated on healthy self-care tools to implement into her daily routine. Patient reports that she is able to swallow better now.  . Provided mental health counseling with regard daily difficulty with depression/isolation/anxiety/mobility/weakness/balance. LCSW used active and reflective listening and implemented  appropriate interventions to help suppport patient and her emotional needs. Advised patient to implement deep breathing/grounding/meditation/self-care exercises into her daily routine to combat stressors. Education provided.  . Discussed plans with patient for ongoing care management follow up and provided patient with direct contact information for care management team . Advised patient to implement deep breathing exercises into her routine whenever anxiety/stress arise to help bring her back to the present state.  . Assisted patient/caregiver with obtaining information about health plan benefits . Provided education and assistance to client regarding Advanced Directives. . Provided education to patient/caregiver regarding level of care options. . Encouraged patient to consider [facility] (mental health provider) for long term follow up and therapy/counseling. PCP made a referral in the past. LCSW provided patient with their contact information and encouraged her to contact them but patient declined services as she reports that her anxiety and depression has improved during phone call on 02/27/20. She is very thankful for this relief in these symptoms and has been actively implementing healthy coping skills into her daily routine to combat her negative thinking. Positive reinforcement provided for this success. . Patient reports improvement with her bowels. . Patient reports that she has a strong social support network of family and friends. . Patient shares that her stomach issues trigger when anxiety arises. She continues to take stool softeners when needed. LCSW provided coping skill education for anxiety management. Patient reports that her anxiety is more tolerable now but admits that her depression can be easily triggered. Depression management and coping skill education provided to patient today. Patient was receptive to education provided. . Patient reports that she has been  implementing more socialization into her routine which has been helpful. Patient reports that she goes to her favorite restaurant to meet her friends once or twice a month. She reports that she went to breaskfast this morning with her son. Also, patient goes out to eat with her spouse weekly on Wednesdays. LCSW encouraged patient to implement additional self-care and socialization activities in order to improve her mood as she often stays inside at home throughout the day. Patient reports that she will start to get out of the house more often to run errands and get vitamin D. Patient has several projects that she works on at home. She reports that she made Christmas Cards for her family this year.  . Praise provided as patient has put in a lot of work by challenging her negative thinking.  Positive reinforcement provided. Patient has been implementing healthy self care into her daily routine to cope with anxiety and depression. Patient reports that she has been taking care of several plants in her home which she is very proud of. Patient shares that she is keeping up with all of her responsibilities such as paying bills on time, eating healthy,  taking medications, washing clothes, etc. . Patient's son is her primary caregiver and he lives in the home. He has some developmental disabilities and has never lived on her own but is very helpful per patient. Patient shares that he works 2 1/2 days per week.  . Patient shares that she received an injection in her knee by Dr. Harlow Mares and it has helped with her mobility. She will get these injections every 3-4 months when needed.  . Patient denies any urgent concerns and reports that her SOB has improved. . Patient reports having a successful hernia replacement surgery. Patient continues to keep a close eye on this recovery process.  . Patient confirms that she is taking her medication as prescribed, eating more protein since her surgery and drinking more fluids in  order to increase her overall self -care. Patient declines experiencing any recent nausea since medication was prescribed. Patient will take this medication when nausea symptoms arise.  . Patient reports that her cousin is a great support to her and she considers her weekly conversations with her as her very own therapy. Patient reports that she talks to her sister in law, cousin and son about her OCD as it gets triggered every year when it turns cold. Patient shares that her pain gets triggered by the cold as well. LCSW was educated on the "rubber band' method. She reports using this strategy in the past. When patient is experiencing obsessive thoughts she will pop her wrist to provide a distraction from that fixation. Cooing skill education provided specifically for OCD. Marland Kitchen Patient shares that she continues to drive herself to all appointments.   Patient Self Care Activities:  . Attends all scheduled provider appointments . Lacks social connections  Please see past updates related to this goal by clicking on the "Past Updates" button in the selected goal        Follow Up Plan: SW will follow up with patient by phone over the next 30 days      Eula Fried, [employer], MSW, [email or web address] Phone: [phone]

## 2020-05-02 ENCOUNTER — Telehealth: Payer: Self-pay

## 2020-05-02 NOTE — Telephone Encounter (Signed)
Copied from CRM [id number]. Topic: General - Other >> May 02, 2020  2:07 PM Gaetana Michaelis A wrote: Reason for CRM: Patient is experiencing difficulty going to the restroom  Patient does not believe they are impacted and had a bowel movement this morning 05/02/20  Patient would like to be contacted by a member of staff to discuss further  Patient declined to make an appointment at the time of call with agent  Please contact to advise

## 2020-05-02 NOTE — Telephone Encounter (Signed)
The pt called complaining of some constipation issues x 4 days since. She state she had a bm today, but she had to force it out. She admits that she discontinued her Linzess because she state it was to expensive. She is currently taking Miralex twice a day. I recommended that the patient call her gastroenterologist to see if they can give her an alternative that is more affordable.  I also recommended that she drink plenty of fluids daily. She verbalize understanding and state that she will give them a call.

## 2020-05-06 ENCOUNTER — Ambulatory Visit: Payer: Self-pay | Admitting: Licensed Clinical Social Worker

## 2020-05-06 DIAGNOSIS — I10 Essential (primary) hypertension: Secondary | ICD-10-CM

## 2020-05-06 DIAGNOSIS — F418 Other specified anxiety disorders: Secondary | ICD-10-CM

## 2020-05-06 DIAGNOSIS — F419 Anxiety disorder, unspecified: Secondary | ICD-10-CM

## 2020-05-06 DIAGNOSIS — F319 Bipolar disorder, unspecified: Secondary | ICD-10-CM

## 2020-05-06 NOTE — Chronic Care Management (AMB) (Signed)
Chronic Care Management    Clinical Social Work Note  05/06/2020 Name: Katrina Sutton MRN: [id number] DOB: Mar 11, 1951  Katrina Sutton is a 70 y.o. year old female who is a primary care patient of Verl Bangs, FNP. The CCM team was consulted to assist the patient with chronic disease management and/or care coordination needs related to: Mental Health Counseling and Resources.   Engaged with patient by telephone for follow up visit in response to provider referral for social work chronic care management and care coordination services.   Consent to Services:  The patient was given the following information about Chronic Care Management services today, agreed to services, and gave verbal consent: 1. CCM service includes personalized support from designated clinical staff supervised by the primary care provider, including individualized plan of care and coordination with other care providers 2. 24/7 contact phone numbers for assistance for urgent and routine care needs. 3. Service will only be billed when office clinical staff spend 20 minutes or more in a month to coordinate care. 4. Only one practitioner may furnish and bill the service in a calendar month. 5.The patient may stop CCM services at any time (effective at the end of the month) by phone call to the office staff. 6. The patient will be responsible for cost sharing (co-pay) of up to 20% of the service fee (after annual deductible is met). Patient agreed to services and consent obtained.  Patient agreed to services and consent obtained.   Assessment: Review of patient past medical history, allergies, medications, and health status, including review of relevant consultants reports was performed today as part of a comprehensive evaluation and provision of chronic care management and care coordination services.     SDOH (Social Determinants of Health) assessments and interventions performed:    Advanced Directives Status: See Care Plan for  related entries.  CCM Care Plan  Allergies  Allergen Reactions   Erythromycin Shortness Of Breath and Rash   Duloxetine Palpitations    Outpatient Encounter Medications as of 05/06/2020  Medication Sig Note   acetaminophen (TYLENOL) 500 MG tablet Take 500 mg by mouth every 8 (eight) hours as needed for headache.    clonazePAM (KLONOPIN) 1 MG tablet TAKE 1 TABLET BY MOUTH TWICE A DAY AS NEEDED FOR ANXIETY.    hydrocortisone cream 1 % Apply 1 application topically 2 (two) times daily as needed for itching.  06/19/2019: Very seldom    linaclotide (LINZESS) 145 MCG CAPS capsule Take 1 capsule (145 mcg total) by mouth daily before breakfast.    omeprazole (PRILOSEC) 40 MG capsule Take 1 capsule (40 mg total) by mouth every morning.    PARoxetine (PAXIL) 40 MG tablet Take 1 tablet (40 mg total) by mouth daily.    No facility-administered encounter medications on file as of 05/06/2020.    Patient Active Problem List   Diagnosis Date Noted   Intestinal obstruction due to recurrent umbilical hernia 42/59/5638   SBO (small bowel obstruction) ([place]) 10/26/2019   Recurrent ventral hernia 10/26/2019   AKI (acute kidney injury) ([place]) 10/26/2019   Hospital discharge follow-up 10/25/2019   Hypokalemia 10/25/2019   Hypertension 08/30/2019   Leg swelling 05/08/2019   Acute respiratory failure with hypoxia ([place]) 05/08/2019   Paraesophageal hernia 05/08/2019   Constipation 75/64/3329   S/P umbilical hernia repair, follow-up exam 06/21/2018   S/P laparoscopic cholecystectomy 05/31/2018   Gallstone pancreatitis    Umbilical hernia    Dyspnea 03/11/2018   Bipolar 1 disorder ([place])  08/23/2017   GERD (gastroesophageal reflux disease) 12/27/2016   Arthritis 12/27/2016   Anxiety 12/27/2016   Depression with anxiety 12/27/2016   Seasonal allergic rhinitis 12/27/2016    Conditions to be addressed/monitored: Anxiety and Depression; Mental Health Concerns   Care Plan :  General Social Work (Adult)  Updates made by Greg Cutter, LCSW since 05/06/2020 12:00 AM    Problem: Coping Skills (General Plan of Care)     Long-Range Goal: Coping Skills Enhanced   Start Date: 05/06/2020  Recent Progress: On track  Priority: Medium  Note:   Evidence-based guidance:   Acknowledge, normalize and validate difficulty of making life-long lifestyle changes.   Identify current effective and ineffective coping strategies.   Encourage patient and caregiver participation in care to increase self-esteem, confidence and feelings of control.   Consider alternative and complementary therapy approaches such as meditation, mindfulness or yoga.   Encourage participation in cognitive behavioral therapy to foster a positive identity, increase self-awareness, as well as bolster self-esteem, confidence and self-efficacy.   Discuss spirituality; be present as concerns are identified; encourage journaling, prayer, worship services, meditation or pastoral counseling.   Encourage participation in pleasurable group activities such as hobbies, singing, sports or volunteering).   Encourage the use of mindfulness; refer for training or intensive intervention.   Consider the use of meditative movement therapy such as tai chi, yoga or qigong.   Promote a regular daily exercise program based on tolerance, ability and patient choice to support positive thinking about disease or aging.   Notes:   Timeframe:  Long-Range Goal Priority:  Medium Start Date:  05/06/20                         Expected End Date:  08/03/20                     Follow Up Date - 06/10/20   - avoid negative self-talk - develop a personal safety plan - develop a plan to deal with triggers like holidays, anniversaries - exercise at least 2 to 3 times per week - have a plan for how to handle bad days - journal feelings and what helps to feel better or worse - spend time or talk with others at least 2 to 3 times per  week - spend time or talk with others every day - watch for early signs of feeling worse - write in journal every day    Why is this important?    Keeping track of your progress will help your treatment team find the right mix of medicine and therapy for you.   Write in your journal every day.   Day-to-day changes in depression symptoms are normal. It may be more helpful to check your progress at the end of each week instead of every day.    Current Barriers:   Limited social support  Mental Health Concerns   Social Isolation  Limited access to caregiver  Lacks knowledge of community resource: available mental health resources that she can utilize   Clinical Social Work Clinical Goal(s):   Over the next 120 days, client will work with SW to address concerns related to decreasing anxious symptoms by implementing additional mental health support and coping skills into her routine.   Interventions:  Patient interviewed and appropriate assessments performed. Patient reports being very stable at this time.  Patient was interested in Pharmacist, hospital education. CCM LCSW provided education on  local senior centers that offer daily activities and socialization such as [place], [employer] and [employer]. CCM LCSW encouraged patient to contact [facility] to gain additional resource assistance and support.   Patient reports that her anxiety has increased due to recent isolation. Patient was provided coping skill education on anxiety management. Patient was educated on healthy self-care tools to implement into her daily routine. Patient reports that she is able to swallow better now. UPDATE 05/06/20- Patient is getting out of the house more on her own. She is going to meet a friend today to get a hot dog and is excited to have conversation and activity.   Provided mental health counseling with regard daily difficulty with  depression/isolation/anxiety/mobility/weakness/balance. LCSW used active and reflective listening and implemented appropriate interventions to help suppport patient and her emotional needs. Advised patient to implement deep breathing/grounding/meditation/self-care exercises into her daily routine to combat stressors. Education provided.   Discussed plans with patient for ongoing care management follow up and provided patient with direct contact information for care management team  Advised patient to implement deep breathing exercises into her routine whenever anxiety/stress arise to help bring her back to the present state.   Assisted patient/caregiver with obtaining information about health plan benefits  Provided education and assistance to client regarding Advanced Directives.  Provided education to patient/caregiver regarding level of care options.  Encouraged patient to consider [facility] (mental health provider) for long term follow up and therapy/counseling. PCP made a referral in the past. LCSW provided patient with their contact information and encouraged her to contact them but patient declined services as she reports that her anxiety and depression has improved. She is very thankful for this relief in these symptoms and has been actively implementing healthy coping skills into her daily routine to combat her negative thinking. Positive reinforcement provided for this success.  Patient reports improvement with her bowels.  Patient reports that she has a strong social support network of family and friends.  Patient shares that her stomach issues trigger when anxiety arises. She continues to take stool softeners when needed. LCSW provided coping skill education for anxiety management. Patient reports that her anxiety is more tolerable now but admits that her depression can be easily triggered. Depression management and coping skill education provided to patient today.  Patient was receptive to education provided.  Patient reports that she has been implementing more socialization into her routine which has been helpful. Patient reports that she goes to her favorite restaurant to meet her friends once or twice a month. She reports that she went to breaskfast this morning with her son. Also, patient goes out to eat with her spouse weekly on Wednesdays. LCSW encouraged patient to implement additional self-care and socialization activities in order to improve her mood as she often stays inside at home throughout the day. Patient reports that she will start to get out of the house more often to run errands and get vitamin D. Patient has several projects that she works on at home. She reports that she made Christmas Cards for her family this year.   Praise provided as patient has put in a lot of work by challenging her negative thinking.  Positive reinforcement provided. Patient has been implementing healthy self care into her daily routine to cope with anxiety and depression. Patient reports that she has been taking care of several plants in her home which she is very proud of. Patient shares that she is keeping up  her responsibilities such as paying bills on time, eating healthy, taking medications, washing clothes, etc. °• Patient's son is her primary caregiver and he lives in the home. He has some developmental disabilities and has never lived on her own but is very helpful per patient. Patient shares that he works 2 1/2 days per week.  °• Patient shares that she received an injection in her knee by Dr. Bowers and it has helped with her mobility. She will get these injections every 3-4 months when needed.  °• Patient denies any urgent concerns and reports that her SOB has improved. °• Patient reports having a successful hernia replacement surgery. Patient continues to keep a close eye on this recovery process.  °• Patient confirms that she is taking her medication as  prescribed, eating more protein since her surgery and drinking more fluids in order to increase her overall self -care. Patient declines experiencing any recent nausea since medication was prescribed. Patient will take this medication when nausea symptoms arise.  °• Patient reports that her cousin is a great support to her and she considers her weekly conversations with her as her very own therapy. Patient reports that she talks to her sister in law, cousin and son about her OCD as it gets triggered every year when it turns cold. Patient shares that her pain gets triggered by the cold as well. LCSW was educated on the "rubber band' method. She reports using this strategy in the past. When patient is experiencing obsessive thoughts she will pop her wrist to provide a distraction from that fixation. Cooing skill education provided specifically for OCD. °• Patient shares that she continues to drive herself to all appointments.  °• Patient reports that her and her spouses' income has increased which is a great relief to her family as they were experiencing some recent financial strain. Patient's spouse continues to work.  ° °Patient Self Care Activities:  °• Attends all scheduled provider appointments °• Lacks social connections ° °Please see past updates related to this goal by clicking on the "Past Updates" button in the selected goal  ° °  °  ° °Follow Up Plan: SW will follow up with patient by phone over the next quarter °     ° , BSW, MSW, LCSW °[facility] °[place]   [facility] [email or web address] °Phone: [phone] ° ° ° °

## 2020-05-12 ENCOUNTER — Telehealth: Payer: Self-pay | Admitting: General Practice

## 2020-05-12 ENCOUNTER — Telehealth: Payer: Self-pay

## 2020-05-12 NOTE — Telephone Encounter (Signed)
  Chronic Care Management   Outreach Note  05/12/2020 Name: Terra ESSA WENK MRN: [id number] DOB: 12/06/1950  Referred by: Tarri Fuller, FNP Reason for referral : Appointment (RNCM: Follow up for Chronic Disease Management and Care Coordination Needs)   An unsuccessful telephone outreach was attempted today. The patient was referred to the case management team for assistance with care management and care coordination.   Follow Up Plan: Telephone follow up appointment with care management team member scheduled for: 06-09-2020 at 1 pm  Alto Denver RN, MSN, CCM Community Care Coordinator [facility]  [facility] [place] Mobile: [phone]

## 2020-05-14 ENCOUNTER — Ambulatory Visit: Payer: Self-pay | Admitting: Licensed Clinical Social Worker

## 2020-05-14 NOTE — Chronic Care Management (AMB) (Signed)
  Care Management   Follow Up Note   05/14/2020 Name: Sheza JADYN BRASHER MRN: [id number] DOB: 02-20-1951   Referred by: Tarri Fuller, FNP Reason for referral : Care Coordination   Marchelle T Dockstader is a 70 y.o. year old female who is a primary care patient of Tarri Fuller, FNP. The care management team was consulted for assistance with care management and care coordination needs.    Review of patient status, including review of consultants reports, relevant laboratory and other test results, and collaboration with appropriate care team members and the patient's provider was performed as part of comprehensive patient evaluation and provision of chronic care management services.    CCM LCSW missed call from patient on 05/13/20 and returned call on 05/14/20 but was unable to reach patient successfully. A HIPPA compliant voice message was left encouraging patient to return call once available. LCSW will complete additional outreach on 06/10/20.  Dickie La, BSW, MSW, LCSW [facility]  [facility] [email or web address] Phone: [phone]

## 2020-05-20 ENCOUNTER — Ambulatory Visit (INDEPENDENT_AMBULATORY_CARE_PROVIDER_SITE_OTHER): Payer: Medicare HMO | Admitting: Licensed Clinical Social Worker

## 2020-05-20 DIAGNOSIS — F418 Other specified anxiety disorders: Secondary | ICD-10-CM

## 2020-05-20 DIAGNOSIS — I10 Essential (primary) hypertension: Secondary | ICD-10-CM

## 2020-05-20 DIAGNOSIS — F419 Anxiety disorder, unspecified: Secondary | ICD-10-CM

## 2020-05-20 DIAGNOSIS — F319 Bipolar disorder, unspecified: Secondary | ICD-10-CM

## 2020-05-20 NOTE — Chronic Care Management (AMB) (Signed)
Chronic Care Management    Clinical Social Work Note  05/20/2020 Name: Katrina Sutton MRN: [id number] DOB: 1950/10/06  Katrina Sutton is a 70 y.o. year old female who is a primary care patient of Verl Bangs, FNP. The CCM team was consulted to assist the patient with chronic disease management and/or care coordination needs related to: Level of Care Concerns and Mental Health Counseling and Resources.   Engaged with patient by telephone for follow up visit in response to provider referral for social work chronic care management and care coordination services.   Consent to Services:  The patient was given the following information about Chronic Care Management services today, agreed to services, and gave verbal consent: 1. CCM service includes personalized support from designated clinical staff supervised by the primary care provider, including individualized plan of care and coordination with other care providers 2. 24/7 contact phone numbers for assistance for urgent and routine care needs. 3. Service will only be billed when office clinical staff spend 20 minutes or more in a month to coordinate care. 4. Only one practitioner may furnish and bill the service in a calendar month. 5.The patient may stop CCM services at any time (effective at the end of the month) by phone call to the office staff. 6. The patient will be responsible for cost sharing (co-pay) of up to 20% of the service fee (after annual deductible is met). Patient agreed to services and consent obtained.  Patient agreed to services and consent obtained.   Assessment: Review of patient past medical history, allergies, medications, and health status, including review of relevant consultants reports was performed today as part of a comprehensive evaluation and provision of chronic care management and care coordination services.     SDOH (Social Determinants of Health) assessments and interventions performed:    Advanced Directives  Status: See Care Plan for related entries.  CCM Care Plan  Allergies  Allergen Reactions   Erythromycin Shortness Of Breath and Rash   Duloxetine Palpitations    Outpatient Encounter Medications as of 05/20/2020  Medication Sig Note   acetaminophen (TYLENOL) 500 MG tablet Take 500 mg by mouth every 8 (eight) hours as needed for headache.    clonazePAM (KLONOPIN) 1 MG tablet TAKE 1 TABLET BY MOUTH TWICE A DAY AS NEEDED FOR ANXIETY.    hydrocortisone cream 1 % Apply 1 application topically 2 (two) times daily as needed for itching.  06/19/2019: Very seldom    linaclotide (LINZESS) 145 MCG CAPS capsule Take 1 capsule (145 mcg total) by mouth daily before breakfast.    omeprazole (PRILOSEC) 40 MG capsule Take 1 capsule (40 mg total) by mouth every morning.    PARoxetine (PAXIL) 40 MG tablet Take 1 tablet (40 mg total) by mouth daily.    No facility-administered encounter medications on file as of 05/20/2020.    Patient Active Problem List   Diagnosis Date Noted   Intestinal obstruction due to recurrent umbilical hernia 11/65/7903   SBO (small bowel obstruction) ([place]) 10/26/2019   Recurrent ventral hernia 10/26/2019   AKI (acute kidney injury) ([place]) 10/26/2019   Hospital discharge follow-up 10/25/2019   Hypokalemia 10/25/2019   Hypertension 08/30/2019   Leg swelling 05/08/2019   Acute respiratory failure with hypoxia ([place]) 05/08/2019   Paraesophageal hernia 05/08/2019   Constipation 83/33/8329   S/P umbilical hernia repair, follow-up exam 06/21/2018   S/P laparoscopic cholecystectomy 05/31/2018   Gallstone pancreatitis    Umbilical hernia    Dyspnea 03/11/2018  Bipolar 1 disorder ([place]) 08/23/2017   GERD (gastroesophageal reflux disease) 12/27/2016   Arthritis 12/27/2016   Anxiety 12/27/2016   Depression with anxiety 12/27/2016   Seasonal allergic rhinitis 12/27/2016    Conditions to be addressed/monitored: Anxiety; Limited social support, ADL IADL  limitations and Mental Health Concerns   Care Plan : General Social Work (Adult)  Updates made by Greg Cutter, LCSW since 05/20/2020 12:00 AM    Problem: Coping Skills (General Plan of Care)     Long-Range Goal: Coping Skills Enhanced   Start Date: 05/06/2020  Recent Progress: On track  Priority: Medium  Note:    Timeframe:  Long-Range Goal Priority:  Medium Start Date:  05/06/20                         Expected End Date:  08/03/20                     Follow Up Date - 06/10/20   - avoid negative self-talk - develop a personal safety plan - develop a plan to deal with triggers like holidays, anniversaries - exercise at least 2 to 3 times per week - have a plan for how to handle bad days - journal feelings and what helps to feel better or worse - spend time or talk with others at least 2 to 3 times per week - spend time or talk with others every day - watch for early signs of feeling worse - write in journal every day    Why is this important?    Keeping track of your progress will help your treatment team find the right mix of medicine and therapy for you.   Write in your journal every day.   Day-to-day changes in depression symptoms are normal. It may be more helpful to check your progress at the end of each week instead of every day.    Current Barriers:   Limited social support  Mental Health Concerns   Social Isolation  Limited access to caregiver  Lacks knowledge of community resource: available mental health resources that she can utilize   Clinical Social Work Clinical Goal(s):   Over the next 120 days, client will work with SW to address concerns related to decreasing anxious symptoms by implementing additional mental health support and coping skills into her routine.   Interventions:  Patient interviewed and appropriate assessments performed. Patient reports being very stable at this time.  Patient was interested in Pharmacist, hospital  education. CCM LCSW provided education on local senior centers that offer daily activities and socialization such as [place] and [employer]. CCM LCSW encouraged patient to contact [facility] to gain additional resource assistance and support.   Patient reports that her anxiety has increased due to recent isolation. Patient was provided coping skill education on anxiety management. Patient was educated on healthy self-care tools to implement into her daily routine. Patient reports that she is able to swallow better now. UPDATE 05/06/20- Patient is getting out of the house more on her own. She is going to meet a friend today to get a hot dog and is excited to have conversation and activity.   Provided mental health counseling with regard daily difficulty with depression/isolation/anxiety/mobility/weakness/balance. LCSW used active and reflective listening and implemented appropriate interventions to help suppport patient and her emotional needs. Advised patient to implement deep breathing/grounding/meditation/self-care exercises into her daily routine to combat stressors. Education  provided.   Discussed plans with patient for ongoing care management follow up and provided patient with direct contact information for care management team  Advised patient to implement deep breathing exercises into her routine whenever anxiety/stress arise to help bring her back to the present state.   Assisted patient/caregiver with obtaining information about health plan benefits  Provided education and assistance to client regarding Advanced Directives.  Provided education to patient/caregiver regarding level of care options.  Encouraged patient to consider [facility] (mental health provider) for long term follow up and therapy/counseling. PCP made a referral in the past. LCSW provided patient with their contact information and encouraged her to contact them but  patient declined services as she reports that her anxiety and depression has improved. She is very thankful for this relief in these symptoms and has been actively implementing healthy coping skills into her daily routine to combat her negative thinking. Positive reinforcement provided for this success.  Patient reports improvement with her bowels.  Patient reports that she has a strong social support network of family and friends.  Patient shares that her stomach issues trigger when anxiety arises. She continues to take stool softeners when needed. LCSW provided coping skill education for anxiety management. Patient reports that her anxiety is more tolerable now but admits that her depression can be easily triggered. Depression management and coping skill education provided to patient today. Patient was receptive to education provided.  Patient reports that she has been implementing more socialization into her routine which has been helpful. Patient reports that she goes to her favorite restaurant to meet her friends once or twice a month. She reports that she went to breaskfast this morning with her son. Also, patient goes out to eat with her spouse weekly on Wednesdays. LCSW encouraged patient to implement additional self-care and socialization activities in order to improve her mood as she often stays inside at home throughout the day. Patient reports that she will start to get out of the house more often to run errands and get vitamin D. Patient has several projects that she works on at home. She reports that she made Christmas Cards for her family this year.   Praise provided as patient has put in a lot of work by challenging her negative thinking.  Positive reinforcement provided. Patient has been implementing healthy self care into her daily routine to cope with anxiety and depression. Patient reports that she has been taking care of several plants in her home which she is very proud of. Patient  shares that she is keeping up with all of her responsibilities such as paying bills on time, eating healthy, taking medications, washing clothes, etc.  Patient's son is her primary caregiver and he lives in the home. He has some developmental disabilities and has never lived on her own but is very helpful per patient. Patient shares that he works 2 1/2 days per week.   Patient shares that she received an injection in her knee by Dr. Harlow Mares and it has helped with her mobility. She will get these injections every 3-4 months when needed.   Patient denies any urgent concerns and reports that her SOB has improved.  Patient reports having a successful hernia replacement surgery. Patient continues to keep a close eye on this recovery process.   Patient confirms that she is taking her medication as prescribed, eating more protein since her surgery and drinking more fluids in order to increase her overall self -care. Patient declines experiencing  any recent nausea since medication was prescribed. Patient will take this medication when nausea symptoms arise.   Patient reports that her cousin is a great support to her and she considers her weekly conversations with her as her very own therapy. Patient reports that she talks to her sister in law, cousin and son about her OCD as it gets triggered every year when it turns cold. Patient shares that her pain gets triggered by the cold as well. LCSW was educated on the "rubber band' method. She reports using this strategy in the past. When patient is experiencing obsessive thoughts she will pop her wrist to provide a distraction from that fixation. Cooing skill education provided specifically for OCD.  Patient shares that she continues to drive herself to all appointments.   Patient reports that her and her spouses' income has increased which is a great relief to her family as they were experiencing some recent financial strain. Patient's spouse continues to work.    Update 05/20/20- Patient reports that her anxiety has increased so she has been implementing the rubber band trick that CCM LCSW educated her on to help with her racing/negative thoughts that trigger from her OCD and anxiety. Patient reports that she is still going out of the house on her own to get socialization and run errands. She reports that she has been talking with several of her church friends which has helped with her loneliness. Patient reports that her bowel movement has improved which has a relief for her. She states that she is eating well and drinking plenty of fluids.    Patient Self Care Activities:   Attends all scheduled provider appointments  Lacks social connections  Please see past updates related to this goal by clicking on the "Past Updates" button in the selected goal        Follow Up Plan: SW will follow up with patient by phone over the next 30 days      Eula Fried, [place], MSW, [email or web address] Phone: [phone]

## 2020-05-20 NOTE — Patient Instructions (Signed)
Licensed Clinical Social Worker Visit Information  Goals we discussed today:  Goals Addressed            This Visit's Progress   . SW-Track and Manage My Symptoms-Depression        Timeframe:  Long-Range Goal Priority:  Medium Start Date:  05/06/20                         Expected End Date:  08/03/20                     Follow Up Date - 06/10/20   - avoid negative self-talk - develop a personal safety plan - develop a plan to deal with triggers like holidays, anniversaries - exercise at least 2 to 3 times per week - have a plan for how to handle bad days - journal feelings and what helps to feel better or worse - spend time or talk with others at least 2 to 3 times per week - spend time or talk with others every day - watch for early signs of feeling worse - write in journal every day    Why is this important?    Keeping track of your progress will help your treatment team find the right mix of medicine and therapy for you.   Write in your journal every day.   Day-to-day changes in depression symptoms are normal. It may be more helpful to check your progress at the end of each week instead of every day.    Current Barriers:  . Limited social support . Mental Health Concerns  . Social Isolation . Limited access to caregiver . Lacks knowledge of community resource: available mental health resources that she can utilize   Clinical Social Work Clinical Goal(s):  Marland Kitchen Over the next 120 days, client will work with SW to address concerns related to decreasing anxious symptoms by implementing additional mental health support and coping skills into her routine.   Interventions: . Patient interviewed and appropriate assessments performed. Patient reports being very stable at this time. . Patient was interested in gaining local senior resource education. CCM LCSW provided education on local senior centers that offer daily activities and socialization such as [facility], [employer]  and [employer]. CCM LCSW encouraged patient to contact [facility] to gain additional resource assistance and support.  . Patient reports that her anxiety has increased due to recent isolation. Patient was provided coping skill education on anxiety management. Patient was educated on healthy self-care tools to implement into her daily routine. Patient reports that she is able to swallow better now. UPDATE 05/06/20- Patient is getting out of the house more on her own. She is going to meet a friend today to get a hot dog and is excited to have conversation and activity.  . Provided mental health counseling with regard daily difficulty with depression/isolation/anxiety/mobility/weakness/balance. LCSW used active and reflective listening and implemented appropriate interventions to help suppport patient and her emotional needs. Advised patient to implement deep breathing/grounding/meditation/self-care exercises into her daily routine to combat stressors. Education provided.  . Discussed plans with patient for ongoing care management follow up and provided patient with direct contact information for care management team . Advised patient to implement deep breathing exercises into her routine whenever anxiety/stress arise to help bring her back to the present state.  . Assisted patient/caregiver with obtaining information about health plan benefits . Provided education and assistance to client regarding  Advanced Directives. . Provided education to patient/caregiver regarding level of care options. . Encouraged patient to consider [facility] (mental health provider) for long term follow up and therapy/counseling. PCP made a referral in the past. LCSW provided patient with their contact information and encouraged her to contact them but patient declined services as she reports that her anxiety and depression has improved. She is very thankful for this relief in these symptoms and  has been actively implementing healthy coping skills into her daily routine to combat her negative thinking. Positive reinforcement provided for this success. . Patient reports improvement with her bowels. . Patient reports that she has a strong social support network of family and friends. . Patient shares that her stomach issues trigger when anxiety arises. She continues to take stool softeners when needed. LCSW provided coping skill education for anxiety management. Patient reports that her anxiety is more tolerable now but admits that her depression can be easily triggered. Depression management and coping skill education provided to patient today. Patient was receptive to education provided. . Patient reports that she has been implementing more socialization into her routine which has been helpful. Patient reports that she goes to her favorite restaurant to meet her friends once or twice a month. She reports that she went to breaskfast this morning with her son. Also, patient goes out to eat with her spouse weekly on Wednesdays. LCSW encouraged patient to implement additional self-care and socialization activities in order to improve her mood as she often stays inside at home throughout the day. Patient reports that she will start to get out of the house more often to run errands and get vitamin D. Patient has several projects that she works on at home. She reports that she made Christmas Cards for her family this year.  . Praise provided as patient has put in a lot of work by challenging her negative thinking.  Positive reinforcement provided. Patient has been implementing healthy self care into her daily routine to cope with anxiety and depression. Patient reports that she has been taking care of several plants in her home which she is very proud of. Patient shares that she is keeping up with all of her responsibilities such as paying bills on time, eating healthy, taking medications, washing clothes,  etc. . Patient's son is her primary caregiver and he lives in the home. He has some developmental disabilities and has never lived on her own but is very helpful per patient. Patient shares that he works 2 1/2 days per week.  . Patient shares that she received an injection in her knee by Dr. Odis Luster and it has helped with her mobility. She will get these injections every 3-4 months when needed.  . Patient denies any urgent concerns and reports that her SOB has improved. . Patient reports having a successful hernia replacement surgery. Patient continues to keep a close eye on this recovery process.  . Patient confirms that she is taking her medication as prescribed, eating more protein since her surgery and drinking more fluids in order to increase her overall self -care. Patient declines experiencing any recent nausea since medication was prescribed. Patient will take this medication when nausea symptoms arise.  . Patient reports that her cousin is a great support to her and she considers her weekly conversations with her as her very own therapy. Patient reports that she talks to her sister in law, cousin and son about her OCD as it gets triggered every year when it  turns cold. Patient shares that her pain gets triggered by the cold as well. LCSW was educated on the "rubber band' method. She reports using this strategy in the past. When patient is experiencing obsessive thoughts she will pop her wrist to provide a distraction from that fixation. Cooing skill education provided specifically for OCD. Marland Kitchen Patient shares that she continues to drive herself to all appointments.  . Patient reports that her and her spouses' income has increased which is a great relief to her family as they were experiencing some recent financial strain. Patient's spouse continues to work.  Marland Kitchen Update 05/20/20- Patient reports that her anxiety has increased so she has been implementing the rubber band trick that CCM LCSW educated her on  to help with her racing/negative thoughts that trigger from her OCD and anxiety. Patient reports that she is still going out of the house on her own to get socialization and run errands. She reports that she has been talking with several of her church friends which has helped with her loneliness. Patient reports that her bowel movement has improved which has a relief for her. She states that she is eating well and drinking plenty of fluids.    Patient Self Care Activities:  . Attends all scheduled provider appointments . Lacks social connections  Please see past updates related to this goal by clicking on the "Past Updates" button in the selected goal

## 2020-06-09 ENCOUNTER — Ambulatory Visit: Payer: Self-pay | Admitting: General Practice

## 2020-06-09 ENCOUNTER — Telehealth: Payer: Self-pay | Admitting: General Practice

## 2020-06-09 DIAGNOSIS — I10 Essential (primary) hypertension: Secondary | ICD-10-CM

## 2020-06-09 DIAGNOSIS — F319 Bipolar disorder, unspecified: Secondary | ICD-10-CM

## 2020-06-09 DIAGNOSIS — F418 Other specified anxiety disorders: Secondary | ICD-10-CM | POA: Diagnosis not present

## 2020-06-09 DIAGNOSIS — F419 Anxiety disorder, unspecified: Secondary | ICD-10-CM

## 2020-06-09 NOTE — Patient Instructions (Signed)
Visit Information  PATIENT GOALS: Patient Care Plan: RNCM: Management of Depression/Anxiety/Bipolar Disorder (Adult)    Problem Identified: RNCM: Management of Chronic Conditions: Anxiety, Depression, Bipolar disorder   Priority: Medium    Goal: RNCM: Effective Management of Anxiety, Depression, and Bipolar   Start Date: 03/10/2020  Expected End Date: 07/08/2020  Priority: Medium  Note:   Current Barriers:  Marland Kitchen Knowledge Deficits related to management of depression, anxiety, and bipolar disorder . Care Coordination needs related to ongoing support and needs in a patient with behavioral health concerns (depression, anxiety, bipolar) . Chronic Disease Management support and education needs related to effective management of depression, anxiety, and bipolar disorder . Lacks caregiver support.  . Unable to independently manage anxiety, depression, or bipolar disorder . Lacks social connections . Does not contact provider office for questions/concerns  Nurse Case Manager Clinical Goal(s):  Marland Kitchen Over the next 120 days, patient will verbalize understanding of plan for management of anxiety, depression, and bipolar disorder . Over the next 120 days, patient will work with RNCM, pcp , and CCM team to address needs related to exacerbations in anxiety, depression, and bipolar disorder . Over the next 120 days, patient will demonstrate a decrease in anxiety, depression, and bipolar disorder exacerbations as evidenced by stabilized mood and seeing providers as scheduled  . Over the next 120 days, patient will attend all scheduled medical appointments: has follow up with the CCM team. Will schedule and appointment with the pcp as needed.  . Over the next 120 days, patient will work with CM clinical social worker to currently working with the CCM team LCSW . Over the next 120 days, the patient will demonstrate ongoing self health care management ability as evidenced by effective management of anxiety,  depression and bipolar disorder  Interventions:  . 1:1 collaboration with Malfi, Lupita Raider, FNP regarding development and update of comprehensive plan of care as evidenced by provider attestation and co-signature . Inter-disciplinary care team collaboration (see longitudinal plan of care) . Evaluation of current treatment plan related to depression, anxiety, and bipolar disorder and patient's adherence to plan as established by provider. 06-09-2020: The patient states that she has anxiety about life in general but she is doing okay and managing things well. Her husband has been out of town sometimes and she has "separation anxiety" but her son is there and helps her and she is managing and doing well. Denies any acute distress.  . Advised patient to call the provider for changes in condition or worsening sx/sx of depression, anxiety, and bipolar disorder . Provided education to patient re: maintaining health and well being, keeping balance in daily routine, practicing mindfulness as needed, taking medication as prescribed, and utilization of support system when she gets down or anxious . Reviewed medications with patient and discussed compliance, the patient endorses compliance with medications.  06-09-2020: Is concerned about refills, she will need new refills in the near future. Discuss getting an appointment with new pcp. Will in basket the staff and get an appointment for the patient.  . Discussed plans with patient for ongoing care management follow up and provided patient with direct contact information for care management team . Provided patient with mindfulness educational materials related to helping when the patient gets stressed out or overwhelmed  . - anxiety screen reviewed . - depression screen reviewed  Patient Goals/Self-Care Activities Over the next 120 days, patient will:  - Patient will self administer medications as prescribed Patient will attend all scheduled provider  appointments Patient will call provider office for new concerns or questions Patient will work with BSW to address care coordination needs and will continue to work with the clinical team to address health care and disease management related needs.    Follow Up Plan: Telephone follow up appointment with care management team member scheduled for: 08-04-2020 at 11:45 am       Task: RNCM: Identify Depressive Symptoms and Facilitate Treatment   Note:   Care Management Activities:    - anxiety screen reviewed - depression screen reviewed       Patient Care Plan: RNCM: Care Plan for Hypertension (Adult)    Problem Identified: RNCM: Hypertension (Hypertension)   Priority: Medium    Goal: RNCM: HTN monitored and managed   Start Date: 03/10/2020  Priority: Medium  Note:   Objective:  . Last practice recorded BP readings:  BP Readings from Last 3 Encounters:  03/06/20 123/75  12/03/19 129/76  11/20/19 (!) 146/88 .   Marland Kitchen Most recent eGFR/CrCl: No results found for: EGFR  No components found for: CRCL Current Barriers:  Marland Kitchen Knowledge Deficits related to basic understanding of hypertension pathophysiology and self care management . Limited Social Support . Unable to independently manage HTN  . Does not contact provider office for questions/concerns Case Manager Clinical Goal(s):  Marland Kitchen Over the next 120 days, patient will verbalize understanding of plan for hypertension management . Over the next 120 days, patient will demonstrate improved adherence to prescribed treatment plan for hypertension as evidenced by taking all medications as prescribed, monitoring and recording blood pressure as directed, adhering to low sodium/DASH diet . Over the next 120 days, patient will demonstrate improved health management independence as evidenced by checking blood pressure as directed and notifying PCP if SBP>160 or DBP > 90, taking all medications as prescribe, and adhering to a low sodium diet as  discussed. . Over the next 120 days, patient will verbalize basic understanding of hypertension disease process and self health management plan as evidenced by following recommended diet, normalized blood pressures, and routine visits to the pcp Interventions:  Marland Kitchen UNABLE to independently:manage HTN . Evaluation of current treatment plan related to hypertension self management and patient's adherence to plan as established by provider. . Provided education to patient re: stroke prevention, s/s of heart attack and stroke, DASH diet, complications of uncontrolled blood pressure . Reviewed medications with patient and discussed importance of compliance. 06-09-2020: The patient endorses taking medications as ordered. Will need refills in the near future . Discussed plans with patient for ongoing care management follow up and provided patient with direct contact information for care management team . Advised patient, providing education and rationale, to monitor blood pressure daily and record, calling PCP for findings outside established parameters. The patient states her blood pressures have been good. States systolic she is 174 to 944 and diastolic she is 70 to 80. . - anxiety screen reviewed . - depression screen reviewed Patient Goals/Self-Care Activities . Over the next 120 days, patient will:  - UNABLE to independently manage HTN Calls provider office for new concerns, questions, or BP outside discussed parameters Follows a low sodium diet/DASH diet Follow Up Plan: Telephone follow up appointment with care management team member scheduled for: 08-04-2020 at 11:45  am   Task: RNCM: Identify and Monitor Blood Pressure Elevation   Note:   Care Management Activities:    - blood pressure trends reviewed - depression screen reviewed - home or ambulatory blood pressure monitoring encouraged  Patient Care Plan: General Social Work (Adult)    Problem Identified: Coping Skills (General Plan of  Care)     Long-Range Goal: Coping Skills Enhanced   Start Date: 05/06/2020  Recent Progress: On track  Priority: Medium  Note:    Timeframe:  Long-Range Goal Priority:  Medium Start Date:  05/06/20                         Expected End Date:  08/03/20                     Follow Up Date - 06/10/20   - avoid negative self-talk - develop a personal safety plan - develop a plan to deal with triggers like holidays, anniversaries - exercise at least 2 to 3 times per week - have a plan for how to handle bad days - journal feelings and what helps to feel better or worse - spend time or talk with others at least 2 to 3 times per week - spend time or talk with others every day - watch for early signs of feeling worse - write in journal every day    Why is this important?    Keeping track of your progress will help your treatment team find the right mix of medicine and therapy for you.   Write in your journal every day.   Day-to-day changes in depression symptoms are normal. It may be more helpful to check your progress at the end of each week instead of every day.    Current Barriers:  . Limited social support . Mental Health Concerns  . Social Isolation . Limited access to caregiver . Lacks knowledge of community resource: available mental health resources that she can utilize   Clinical Social Work Clinical Goal(s):  Marland Kitchen Over the next 120 days, client will work with SW to address concerns related to decreasing anxious symptoms by implementing additional mental health support and coping skills into her routine.   Interventions: . Patient interviewed and appropriate assessments performed. Patient reports being very stable at this time. . Patient was interested in gaining local senior resource education. CCM LCSW provided education on local senior centers that offer daily activities and socialization such as [facility] and [employer]. CCM LCSW encouraged patient  to contact [facility] to gain additional resource assistance and support.  . Patient reports that her anxiety has increased due to recent isolation. Patient was provided coping skill education on anxiety management. Patient was educated on healthy self-care tools to implement into her daily routine. Patient reports that she is able to swallow better now. UPDATE 05/06/20- Patient is getting out of the house more on her own. She is going to meet a friend today to get a hot dog and is excited to have conversation and activity.  . Provided mental health counseling with regard daily difficulty with depression/isolation/anxiety/mobility/weakness/balance. LCSW used active and reflective listening and implemented appropriate interventions to help suppport patient and her emotional needs. Advised patient to implement deep breathing/grounding/meditation/self-care exercises into her daily routine to combat stressors. Education provided.  . Discussed plans with patient for ongoing care management follow up and provided patient with direct contact information for care management team . Advised patient to implement deep breathing exercises into her routine whenever anxiety/stress arise to help bring her back to the present state.  . Assisted patient/caregiver with obtaining information about health plan benefits . Provided education and assistance  to client regarding Advanced Directives. . Provided education to patient/caregiver regarding level of care options. . Encouraged patient to consider [facility] (mental health provider) for long term follow up and therapy/counseling. PCP made a referral in the past. LCSW provided patient with their contact information and encouraged her to contact them but patient declined services as she reports that her anxiety and depression has improved. She is very thankful for this relief in these symptoms and has been actively implementing healthy coping skills  into her daily routine to combat her negative thinking. Positive reinforcement provided for this success. . Patient reports improvement with her bowels. . Patient reports that she has a strong social support network of family and friends. . Patient shares that her stomach issues trigger when anxiety arises. She continues to take stool softeners when needed. LCSW provided coping skill education for anxiety management. Patient reports that her anxiety is more tolerable now but admits that her depression can be easily triggered. Depression management and coping skill education provided to patient today. Patient was receptive to education provided. . Patient reports that she has been implementing more socialization into her routine which has been helpful. Patient reports that she goes to her favorite restaurant to meet her friends once or twice a month. She reports that she went to breaskfast this morning with her son. Also, patient goes out to eat with her spouse weekly on Wednesdays. LCSW encouraged patient to implement additional self-care and socialization activities in order to improve her mood as she often stays inside at home throughout the day. Patient reports that she will start to get out of the house more often to run errands and get vitamin D. Patient has several projects that she works on at home. She reports that she made Christmas Cards for her family this year.  . Praise provided as patient has put in a lot of work by challenging her negative thinking.  Positive reinforcement provided. Patient has been implementing healthy self care into her daily routine to cope with anxiety and depression. Patient reports that she has been taking care of several plants in her home which she is very proud of. Patient shares that she is keeping up with all of her responsibilities such as paying bills on time, eating healthy, taking medications, washing clothes, etc. . Patient's son is her primary caregiver and he  lives in the home. He has some developmental disabilities and has never lived on her own but is very helpful per patient. Patient shares that he works 2 1/2 days per week.  . Patient shares that she received an injection in her knee by Dr. Harlow Mares and it has helped with her mobility. She will get these injections every 3-4 months when needed.  . Patient denies any urgent concerns and reports that her SOB has improved. . Patient reports having a successful hernia replacement surgery. Patient continues to keep a close eye on this recovery process.  . Patient confirms that she is taking her medication as prescribed, eating more protein since her surgery and drinking more fluids in order to increase her overall self -care. Patient declines experiencing any recent nausea since medication was prescribed. Patient will take this medication when nausea symptoms arise.  . Patient reports that her cousin is a great support to her and she considers her weekly conversations with her as her very own therapy. Patient reports that she talks to her sister in law, cousin and son about her OCD as it gets triggered every  year when it turns cold. Patient shares that her pain gets triggered by the cold as well. LCSW was educated on the "rubber band' method. She reports using this strategy in the past. When patient is experiencing obsessive thoughts she will pop her wrist to provide a distraction from that fixation. Cooing skill education provided specifically for OCD. Marland Kitchen Patient shares that she continues to drive herself to all appointments.  . Patient reports that her and her spouses' income has increased which is a great relief to her family as they were experiencing some recent financial strain. Patient's spouse continues to work.  Marland Kitchen Update 05/20/20- Patient reports that her anxiety has increased so she has been implementing the rubber band trick that CCM LCSW educated her on to help with her racing/negative thoughts that trigger  from her OCD and anxiety. Patient reports that she is still going out of the house on her own to get socialization and run errands. She reports that she has been talking with several of her church friends which has helped with her loneliness. Patient reports that her bowel movement has improved which has a relief for her. She states that she is eating well and drinking plenty of fluids.    Patient Self Care Activities:  . Attends all scheduled provider appointments . Lacks social connections  Please see past updates related to this goal by clicking on the "Past Updates" button in the selected goal     Task: Support Psychosocial Response to Risk or Actual Health Condition   Note:   Care Management Activities:    - active listening utilized - counseling provided - current coping strategies identified - decision-making supported - healthy lifestyle promoted - journaling promoted - meditative movement therapy encouraged - mindfulness encouraged - participation in counseling encouraged - problem-solving facilitated - relaxation techniques promoted - self-reflection promoted - spiritual activities promoted - verbalization of feelings encouraged    Notes:      The patient verbalized understanding of instructions, educational materials, and care plan provided today and declined offer to receive copy of patient instructions, educational materials, and care plan.   Telephone follow up appointment with care management team member scheduled for: 08-04-2020 at 11:45 am  Noreene Larsson RN, MSN, [facility] Mobile: [phone]

## 2020-06-09 NOTE — Chronic Care Management (AMB) (Signed)
Chronic Care Management   CCM RN Visit Note  06/09/2020 Name: Katrina Sutton MRN: [id number] DOB: [birth year]  Subjective: Katrina Sutton is a 70 y.o. year old female who is a primary care patient of Verl Bangs, FNP. The care management team was consulted for assistance with disease management and care coordination needs.    Engaged with patient by telephone for follow up visit in response to provider referral for case management and/or care coordination services.   Consent to Services:  The patient was given information about Chronic Care Management services, agreed to services, and gave verbal consent prior to initiation of services.  Please see initial visit note for detailed documentation.   Patient agreed to services and verbal consent obtained.   Assessment: Review of patient past medical history, allergies, medications, health status, including review of consultants reports, laboratory and other test data, was performed as part of comprehensive evaluation and provision of chronic care management services.   SDOH (Social Determinants of Health) assessments and interventions performed:    CCM Care Plan  Allergies  Allergen Reactions  . Erythromycin Shortness Of Breath and Rash  . Duloxetine Palpitations    Outpatient Encounter Medications as of 06/09/2020  Medication Sig Note  . acetaminophen (TYLENOL) 500 MG tablet Take 500 mg by mouth every 8 (eight) hours as needed for headache.   . clonazePAM (KLONOPIN) 1 MG tablet TAKE 1 TABLET BY MOUTH TWICE A DAY AS NEEDED FOR ANXIETY.   . hydrocortisone cream 1 % Apply 1 application topically 2 (two) times daily as needed for itching.  06/19/2019: Very seldom   . linaclotide (LINZESS) 145 MCG CAPS capsule Take 1 capsule (145 mcg total) by mouth daily before breakfast.   . omeprazole (PRILOSEC) 40 MG capsule Take 1 capsule (40 mg total) by mouth every morning.   Marland Kitchen PARoxetine (PAXIL) 40 MG tablet Take 1 tablet (40 mg total) by mouth daily.     No facility-administered encounter medications on file as of 06/09/2020.    Patient Active Problem List   Diagnosis Date Noted  . Intestinal obstruction due to recurrent umbilical hernia 95/62/1308  . SBO (small bowel obstruction) ([place]) 10/26/2019  . Recurrent ventral hernia 10/26/2019  . AKI (acute kidney injury) ([place]) 10/26/2019  . Hospital discharge follow-up 10/25/2019  . Hypokalemia 10/25/2019  . Hypertension 08/30/2019  . Leg swelling 05/08/2019  . Acute respiratory failure with hypoxia ([place]) 05/08/2019  . Paraesophageal hernia 05/08/2019  . Constipation 04/17/2019  . S/P umbilical hernia repair, follow-up exam 06/21/2018  . S/P laparoscopic cholecystectomy 05/31/2018  . Gallstone pancreatitis   . Umbilical hernia   . Dyspnea 03/11/2018  . Bipolar 1 disorder ([place]) 08/23/2017  . GERD (gastroesophageal reflux disease) 12/27/2016  . Arthritis 12/27/2016  . Anxiety 12/27/2016  . Depression with anxiety 12/27/2016  . Seasonal allergic rhinitis 12/27/2016    Conditions to be addressed/monitored:HTN, Anxiety and Depression  Care Plan : RNCM: Management of Depression/Anxiety/Bipolar Disorder (Adult)  Updates made by Vanita Ingles since 06/09/2020 12:00 AM    Problem: RNCM: Management of Chronic Conditions: Anxiety, Depression, Bipolar disorder   Priority: Medium    Goal: RNCM: Effective Management of Anxiety, Depression, and Bipolar   Start Date: 03/10/2020  Expected End Date: 07/08/2020  Priority: Medium  Note:   Current Barriers:  Marland Kitchen Knowledge Deficits related to management of depression, anxiety, and bipolar disorder . Care Coordination needs related to ongoing support and needs in a patient with behavioral health concerns (depression, anxiety, bipolar) .  Chronic Disease Management support and education needs related to effective management of depression, anxiety, and bipolar disorder . Lacks caregiver support.  . Unable to independently manage anxiety, depression, or  bipolar disorder . Lacks social connections . Does not contact provider office for questions/concerns  Nurse Case Manager Clinical Goal(s):  Marland Kitchen Over the next 120 days, patient will verbalize understanding of plan for management of anxiety, depression, and bipolar disorder . Over the next 120 days, patient will work with RNCM, pcp , and CCM team to address needs related to exacerbations in anxiety, depression, and bipolar disorder . Over the next 120 days, patient will demonstrate a decrease in anxiety, depression, and bipolar disorder exacerbations as evidenced by stabilized mood and seeing providers as scheduled  . Over the next 120 days, patient will attend all scheduled medical appointments: has follow up with the CCM team. Will schedule and appointment with the pcp as needed.  . Over the next 120 days, patient will work with CM clinical social worker to currently working with the CCM team LCSW . Over the next 120 days, the patient will demonstrate ongoing self health care management ability as evidenced by effective management of anxiety, depression and bipolar disorder  Interventions:  . 1:1 collaboration with Malfi, Lupita Raider, FNP regarding development and update of comprehensive plan of care as evidenced by provider attestation and co-signature . Inter-disciplinary care team collaboration (see longitudinal plan of care) . Evaluation of current treatment plan related to depression, anxiety, and bipolar disorder and patient's adherence to plan as established by provider. 06-09-2020: The patient states that she has anxiety about life in general but she is doing okay and managing things well. Her husband has been out of town sometimes and she has "separation anxiety" but her son is there and helps her and she is managing and doing well. Denies any acute distress.  . Advised patient to call the provider for changes in condition or worsening sx/sx of depression, anxiety, and bipolar  disorder . Provided education to patient re: maintaining health and well being, keeping balance in daily routine, practicing mindfulness as needed, taking medication as prescribed, and utilization of support system when she gets down or anxious . Reviewed medications with patient and discussed compliance, the patient endorses compliance with medications.  06-09-2020: Is concerned about refills, she will need new refills in the near future. Discuss getting an appointment with new pcp. Will in basket the staff and get an appointment for the patient.  . Discussed plans with patient for ongoing care management follow up and provided patient with direct contact information for care management team . Provided patient with mindfulness educational materials related to helping when the patient gets stressed out or overwhelmed  . - anxiety screen reviewed . - depression screen reviewed  Patient Goals/Self-Care Activities Over the next 120 days, patient will:  - Patient will self administer medications as prescribed Patient will attend all scheduled provider appointments Patient will call provider office for new concerns or questions Patient will work with BSW to address care coordination needs and will continue to work with the clinical team to address health care and disease management related needs.    Follow Up Plan: Telephone follow up appointment with care management team member scheduled for: 08-04-2020 at 11:45 am       Care Plan : RNCM: Care Plan for Hypertension (Adult)  Updates made by Vanita Ingles since 06/09/2020 12:00 AM    Problem: RNCM: Hypertension (Hypertension)   Priority: Medium  Goal: RNCM: HTN monitored and managed   Start Date: 03/10/2020  Priority: Medium  Note:   Objective:  . Last practice recorded BP readings:  BP Readings from Last 3 Encounters:  03/06/20 123/75  12/03/19 129/76  11/20/19 (!) 146/88 .   Marland Kitchen Most recent eGFR/CrCl: No results found for: EGFR  No  components found for: CRCL Current Barriers:  Marland Kitchen Knowledge Deficits related to basic understanding of hypertension pathophysiology and self care management . Limited Social Support . Unable to independently manage HTN  . Does not contact provider office for questions/concerns Case Manager Clinical Goal(s):  Marland Kitchen Over the next 120 days, patient will verbalize understanding of plan for hypertension management . Over the next 120 days, patient will demonstrate improved adherence to prescribed treatment plan for hypertension as evidenced by taking all medications as prescribed, monitoring and recording blood pressure as directed, adhering to low sodium/DASH diet . Over the next 120 days, patient will demonstrate improved health management independence as evidenced by checking blood pressure as directed and notifying PCP if SBP>160 or DBP > 90, taking all medications as prescribe, and adhering to a low sodium diet as discussed. . Over the next 120 days, patient will verbalize basic understanding of hypertension disease process and self health management plan as evidenced by following recommended diet, normalized blood pressures, and routine visits to the pcp Interventions:  Marland Kitchen UNABLE to independently:manage HTN . Evaluation of current treatment plan related to hypertension self management and patient's adherence to plan as established by provider. . Provided education to patient re: stroke prevention, s/s of heart attack and stroke, DASH diet, complications of uncontrolled blood pressure . Reviewed medications with patient and discussed importance of compliance. 06-09-2020: The patient endorses taking medications as ordered. Will need refills in the near future . Discussed plans with patient for ongoing care management follow up and provided patient with direct contact information for care management team . Advised patient, providing education and rationale, to monitor blood pressure daily and record, calling  PCP for findings outside established parameters. The patient states her blood pressures have been good. States systolic she is 694 to 854 and diastolic she is 70 to 80. . - anxiety screen reviewed . - depression screen reviewed Patient Goals/Self-Care Activities . Over the next 120 days, patient will:  - UNABLE to independently manage HTN Calls provider office for new concerns, questions, or BP outside discussed parameters Follows a low sodium diet/DASH diet Follow Up Plan: Telephone follow up appointment with care management team member scheduled for: 08-04-2020 at 11:45  am     Plan:Telephone follow up appointment with care management team member scheduled for:  08-04-2020 at 11:45 am  [place], MSN, [place] Mobile: [phone]

## 2020-06-10 ENCOUNTER — Ambulatory Visit: Payer: Medicare HMO | Admitting: Licensed Clinical Social Worker

## 2020-06-10 DIAGNOSIS — F419 Anxiety disorder, unspecified: Secondary | ICD-10-CM

## 2020-06-10 DIAGNOSIS — I10 Essential (primary) hypertension: Secondary | ICD-10-CM

## 2020-06-10 DIAGNOSIS — F418 Other specified anxiety disorders: Secondary | ICD-10-CM

## 2020-06-10 DIAGNOSIS — F319 Bipolar disorder, unspecified: Secondary | ICD-10-CM

## 2020-06-10 DIAGNOSIS — K219 Gastro-esophageal reflux disease without esophagitis: Secondary | ICD-10-CM

## 2020-06-10 NOTE — Chronic Care Management (AMB) (Signed)
Chronic Care Management    Clinical Social Work Note  06/10/2020 Name: Albana DEWANDA FENNEMA MRN: [id number] DOB: February 05, 1951  Aylani T Elwell is a 70 y.o. year old female who is a primary care patient of Verl Bangs, FNP. The CCM team was consulted to assist the patient with chronic disease management and/or care coordination needs related to: Mental Health Counseling and Resources.   Engaged with patient by telephone for follow up visit in response to provider referral for social work chronic care management and care coordination services.   Consent to Services:  The patient was given the following information about Chronic Care Management services today, agreed to services, and gave verbal consent: 1. CCM service includes personalized support from designated clinical staff supervised by the primary care provider, including individualized plan of care and coordination with other care providers 2. 24/7 contact phone numbers for assistance for urgent and routine care needs. 3. Service will only be billed when office clinical staff spend 20 minutes or more in a month to coordinate care. 4. Only one practitioner may furnish and bill the service in a calendar month. 5.The patient may stop CCM services at any time (effective at the end of the month) by phone call to the office staff. 6. The patient will be responsible for cost sharing (co-pay) of up to 20% of the service fee (after annual deductible is met). Patient agreed to services and consent obtained.  Patient agreed to services and consent obtained.   Assessment: Review of patient past medical history, allergies, medications, and health status, including review of relevant consultants reports was performed today as part of a comprehensive evaluation and provision of chronic care management and care coordination services.     SDOH (Social Determinants of Health) assessments and interventions performed:    Advanced Directives Status: Not addressed in this  encounter.  CCM Care Plan  Allergies  Allergen Reactions  . Erythromycin Shortness Of Breath and Rash  . Duloxetine Palpitations    Outpatient Encounter Medications as of 06/10/2020  Medication Sig Note  . acetaminophen (TYLENOL) 500 MG tablet Take 500 mg by mouth every 8 (eight) hours as needed for headache.   . clonazePAM (KLONOPIN) 1 MG tablet TAKE 1 TABLET BY MOUTH TWICE A DAY AS NEEDED FOR ANXIETY.   . hydrocortisone cream 1 % Apply 1 application topically 2 (two) times daily as needed for itching.  06/19/2019: Very seldom   . linaclotide (LINZESS) 145 MCG CAPS capsule Take 1 capsule (145 mcg total) by mouth daily before breakfast.   . omeprazole (PRILOSEC) 40 MG capsule Take 1 capsule (40 mg total) by mouth every morning.   Marland Kitchen PARoxetine (PAXIL) 40 MG tablet Take 1 tablet (40 mg total) by mouth daily.    No facility-administered encounter medications on file as of 06/10/2020.    Patient Active Problem List   Diagnosis Date Noted  . Intestinal obstruction due to recurrent umbilical hernia 46/56/8127  . SBO (small bowel obstruction) ([place]) 10/26/2019  . Recurrent ventral hernia 10/26/2019  . AKI (acute kidney injury) ([place]) 10/26/2019  . Hospital discharge follow-up 10/25/2019  . Hypokalemia 10/25/2019  . Hypertension 08/30/2019  . Leg swelling 05/08/2019  . Acute respiratory failure with hypoxia ([place]) 05/08/2019  . Paraesophageal hernia 05/08/2019  . Constipation 04/17/2019  . S/P umbilical hernia repair, follow-up exam 06/21/2018  . S/P laparoscopic cholecystectomy 05/31/2018  . Gallstone pancreatitis   . Umbilical hernia   . Dyspnea 03/11/2018  . Bipolar 1 disorder ([place]) 08/23/2017  .  GERD (gastroesophageal reflux disease) 12/27/2016  . Arthritis 12/27/2016  . Anxiety 12/27/2016  . Depression with anxiety 12/27/2016  . Seasonal allergic rhinitis 12/27/2016    Conditions to be addressed/monitored: Anxiety and Depression; ADL IADL limitations, Mental Health Concerns ,  Social Isolation and Limited access to caregiver  Care Plan : General Social Work (Adult)  Updates made by Greg Cutter, LCSW since 06/10/2020 12:00 AM    Problem: Coping Skills (General Plan of Care)     Long-Range Goal: Coping Skills Enhanced   Start Date: 05/06/2020  Recent Progress: On track  Priority: Medium  Note:    Timeframe:  Long-Range Goal Priority:  Medium Start Date:  05/06/20                         Expected End Date:  08/03/20                     Follow Up Date - 07/23/20   - avoid negative self-talk - develop a personal safety plan - develop a plan to deal with triggers like holidays, anniversaries - exercise at least 2 to 3 times per week - have a plan for how to handle bad days - journal feelings and what helps to feel better or worse - spend time or talk with others at least 2 to 3 times per week - spend time or talk with others every day - watch for early signs of feeling worse - write in journal every day    Why is this important?    Keeping track of your progress will help your treatment team find the right mix of medicine and therapy for you.   Write in your journal every day.   Day-to-day changes in depression symptoms are normal. It may be more helpful to check your progress at the end of each week instead of every day.    Current Barriers:  . Limited social support . Mental Health Concerns  . Social Isolation . Limited access to caregiver . Lacks knowledge of community resource: available mental health resources that she can utilize   Clinical Social Work Clinical Goal(s):  Marland Kitchen Over the next 120 days, client will work with SW to address concerns related to decreasing anxious symptoms by implementing additional mental health support and coping skills into her routine.   Interventions: . Patient interviewed and appropriate assessments performed. Patient reports being very stable at this time. . Patient was informed that current CCM LCSW will be  leaving position next month and her next CCM Social Work follow up visit will be with another LCSW. Patient was appreciative of support provided and receptive to news . Patient was interested in Pharmacist, hospital education. CCM LCSW provided education on local senior centers that offer daily activities and socialization such as [place] and [employer]. CCM LCSW encouraged patient to contact [facility] to gain additional resource assistance and support.  . Patient reports that her anxiety increases with isolation. Patient was provided coping skill education on anxiety management. Patient was educated on healthy self-care tools to implement into her daily routine. Patient reports that she is able to swallow better now. Patient is trying to get out of the house more on her own. She recently gained socialization when she got out to meet a friend for a hot dog and was able to gain good conversation and activity.  . Provided mental health counseling with regard  daily difficulty with depression/isolation/anxiety/mobility/weakness/balance. LCSW used active and reflective listening and implemented appropriate interventions to help suppport patient and her emotional needs. Advised patient to implement deep breathing/grounding/meditation/self-care exercises into her daily routine to combat stressors. Education provided.  . Discussed plans with patient for ongoing care management follow up and provided patient with direct contact information for care management team . Advised patient to implement deep breathing exercises into her routine whenever anxiety/stress arise to help bring her back to the present state.  . Assisted patient/caregiver with obtaining information about health plan benefits . Provided education and assistance to client regarding Advanced Directives. . Provided education to patient/caregiver regarding level of care options. . Encouraged patient to  consider [facility] (mental health provider) for long term follow up and therapy/counseling. PCP made a referral in the past. LCSW provided patient with their contact information and encouraged her to contact them but patient declined services as she reports that her anxiety and depression is being managed. She has been actively implementing healthy coping skills into her daily routine to combat her negative thinking. Positive reinforcement provided for this success. . Patient reports improvement with her bowels. . Patient reports that she has a strong social support network of family and friends. . Patient shares that her stomach issues trigger when anxiety arises. She continues to take stool softeners when needed. LCSW provided coping skill education for anxiety management. Patient reports that her anxiety is more tolerable now but admits that her depression can be easily triggered. Depression management and coping skill education provided to patient today. Patient was receptive to education provided. . Patient reports that she has been implementing more socialization into her routine which has been helpful. She reports that the cold weather does affect her mood but she still makes it a priority to get out of the house often. Patient reports that she goes to her favorite restaurant to meet her friends once or twice a month. She reports that she went to breaskfast this morning with her son. Also, patient goes out to eat with her spouse weekly on Wednesdays. LCSW encouraged patient to implement additional self-care and socialization activities in order to improve her mood as she often stays inside at home throughout the day. Patient reports that she will start to get out of the house more often to run errands and get vitamin D. Patient has several projects that she works on at home. She reports that she successfully made Christmas Cards for her family this year.  . Praise provided as patient  has put in a lot of work by challenging her negative thinking.  Positive reinforcement provided. Patient has been implementing healthy self care into her daily routine to cope with anxiety and depression. Patient reports that she has been taking care of several plants in her home which she is very proud of. Patient shares that she is keeping up with all of her responsibilities such as paying bills on time, eating healthy, taking medications, washing clothes, etc. . Patient's son is her primary caregiver and he lives in the home. He has some developmental disabilities and has never lived on his own but is very helpful per patient. Patient shares that he works 2 1/2 days per week.  . Patient shares that she received an injection in her knee by Dr. Harlow Mares and it has helped with her mobility. She will get these injections every 3-4 months when needed.  . Patient denies any urgent concerns and reports that her SOB has improved. Marland Kitchen  Patient reports having a successful hernia replacement surgery. Patient continues to keep a close eye on this recovery process.  . Patient confirms that she is taking her medication as prescribed, eating more protein since her surgery and drinking more fluids in order to increase her overall self -care. Patient declines experiencing any recent nausea since medication was prescribed. Patient will take this medication when nausea symptoms arise.  . Patient reports that her cousin is a great support to her and she considers her weekly conversations with her as her very own therapy. Patient reports that she talks to her sister in law, cousin and son about her OCD as it gets triggered every year when it turns cold. Patient shares that her pain gets triggered by the cold as well. LCSW was educated on the "rubber band' method. She reports using this strategy in the past. When patient is experiencing obsessive thoughts she will pop her wrist to provide a distraction from that fixation. Coping  skill education provided specifically for OCD. Marland Kitchen Patient shares that she continues to drive herself to all appointments. Patient had car worked on last week. Patient reports that she is thankful that she is still able to drive safely as getting out of the house is beneficial to her mood.  . Patient reports that her and her spouses' income has increased which is a great relief to her family as they were experiencing some recent financial strain. Patient's spouse continues to work.  . Patient reports that her anxiety has increased so she has been implementing the rubber band trick that CCM LCSW educated her on to help with her racing/negative thoughts that trigger from her OCD and anxiety. Patient reports that she is still going out of the house on her own to get socialization and run errands. She reports that she has been talking with several of her church friends which has helped with her loneliness. Patient reports that her bowel movement has improved which has a relief for her. She states that she is eating well and drinking plenty of fluids.    Patient Self Care Activities:  . Attends all scheduled provider appointments . Lacks social connections  Please see past updates related to this goal by clicking on the "Past Updates" button in the selected goal        Follow Up Plan: SW will follow up with patient by phone over the next quarter      Eula Fried, [place], MSW, [email or web address] Phone: [phone]

## 2020-06-10 NOTE — Patient Instructions (Signed)
Licensed Clinical Social Worker Visit Information  Goals we discussed today:  Goals Addressed            This Visit's Progress   . SW-Track and Manage My Symptoms-Depression        Timeframe:  Long-Range Goal Priority:  Medium Start Date:  05/06/20                         Expected End Date:  08/03/20                     Follow Up Date - 07/23/20   - avoid negative self-talk - develop a personal safety plan - develop a plan to deal with triggers like holidays, anniversaries - exercise at least 2 to 3 times per week - have a plan for how to handle bad days - journal feelings and what helps to feel better or worse - spend time or talk with others at least 2 to 3 times per week - spend time or talk with others every day - watch for early signs of feeling worse - write in journal every day    Why is this important?    Keeping track of your progress will help your treatment team find the right mix of medicine and therapy for you.   Write in your journal every day.   Day-to-day changes in depression symptoms are normal. It may be more helpful to check your progress at the end of each week instead of every day.    Current Barriers:  . Limited social support . Mental Health Concerns  . Social Isolation . Limited access to caregiver . Lacks knowledge of community resource: available mental health resources that she can utilize   Clinical Social Work Clinical Goal(s):  Marland Kitchen Over the next 120 days, client will work with SW to address concerns related to decreasing anxious symptoms by implementing additional mental health support and coping skills into her routine.   Interventions: . Patient interviewed and appropriate assessments performed. Patient reports being very stable at this time. . Patient was informed that current CCM LCSW will be leaving position next month and her next CCM Social Work follow up visit will be with another LCSW. Patient was appreciative of support provided and  receptive to news . Patient was interested in Print production planner education. CCM LCSW provided education on local senior centers that offer daily activities and socialization such as [facility], [employer] and [facility]. CCM LCSW encouraged patient to contact [facility] to gain additional resource assistance and support.  . Patient reports that her anxiety increases with isolation. Patient was provided coping skill education on anxiety management. Patient was educated on healthy self-care tools to implement into her daily routine. Patient reports that she is able to swallow better now. Patient is trying to get out of the house more on her own. She recently gained socialization when she got out to meet a friend for a hot dog and was able to gain good conversation and activity.  . Provided mental health counseling with regard daily difficulty with depression/isolation/anxiety/mobility/weakness/balance. LCSW used active and reflective listening and implemented appropriate interventions to help suppport patient and her emotional needs. Advised patient to implement deep breathing/grounding/meditation/self-care exercises into her daily routine to combat stressors. Education provided.  . Discussed plans with patient for ongoing care management follow up and provided patient with direct contact information for care management team . Advised patient  to implement deep breathing exercises into her routine whenever anxiety/stress arise to help bring her back to the present state.  . Assisted patient/caregiver with obtaining information about health plan benefits . Provided education and assistance to client regarding Advanced Directives. . Provided education to patient/caregiver regarding level of care options. . Encouraged patient to consider [facility] (mental health provider) for long term follow up and therapy/counseling. PCP made a referral in the past. LCSW  provided patient with their contact information and encouraged her to contact them but patient declined services as she reports that her anxiety and depression is being managed. She has been actively implementing healthy coping skills into her daily routine to combat her negative thinking. Positive reinforcement provided for this success. . Patient reports improvement with her bowels. . Patient reports that she has a strong social support network of family and friends. . Patient shares that her stomach issues trigger when anxiety arises. She continues to take stool softeners when needed. LCSW provided coping skill education for anxiety management. Patient reports that her anxiety is more tolerable now but admits that her depression can be easily triggered. Depression management and coping skill education provided to patient today. Patient was receptive to education provided. . Patient reports that she has been implementing more socialization into her routine which has been helpful. She reports that the cold weather does affect her mood but she still makes it a priority to get out of the house often. Patient reports that she goes to her favorite restaurant to meet her friends once or twice a month. She reports that she went to breaskfast this morning with her son. Also, patient goes out to eat with her spouse weekly on Wednesdays. LCSW encouraged patient to implement additional self-care and socialization activities in order to improve her mood as she often stays inside at home throughout the day. Patient reports that she will start to get out of the house more often to run errands and get vitamin D. Patient has several projects that she works on at home. She reports that she successfully made Christmas Cards for her family this year.  . Praise provided as patient has put in a lot of work by challenging her negative thinking.  Positive reinforcement provided. Patient has been implementing healthy self care into  her daily routine to cope with anxiety and depression. Patient reports that she has been taking care of several plants in her home which she is very proud of. Patient shares that she is keeping up with all of her responsibilities such as paying bills on time, eating healthy, taking medications, washing clothes, etc. . Patient's son is her primary caregiver and he lives in the home. He has some developmental disabilities and has never lived on his own but is very helpful per patient. Patient shares that he works 2 1/2 days per week.  . Patient shares that she received an injection in her knee by Dr. Odis Luster and it has helped with her mobility. She will get these injections every 3-4 months when needed.  . Patient denies any urgent concerns and reports that her SOB has improved. . Patient reports having a successful hernia replacement surgery. Patient continues to keep a close eye on this recovery process.  . Patient confirms that she is taking her medication as prescribed, eating more protein since her surgery and drinking more fluids in order to increase her overall self -care. Patient declines experiencing any recent nausea since medication was prescribed. Patient will take this  medication when nausea symptoms arise.  . Patient reports that her cousin is a great support to her and she considers her weekly conversations with her as her very own therapy. Patient reports that she talks to her sister in law, cousin and son about her OCD as it gets triggered every year when it turns cold. Patient shares that her pain gets triggered by the cold as well. LCSW was educated on the "rubber band' method. She reports using this strategy in the past. When patient is experiencing obsessive thoughts she will pop her wrist to provide a distraction from that fixation. Coping skill education provided specifically for OCD. Marland Kitchen Patient shares that she continues to drive herself to all appointments. Patient had car worked on last  week. Patient reports that she is thankful that she is still able to drive safely as getting out of the house is beneficial to her mood.  . Patient reports that her and her spouses' income has increased which is a great relief to her family as they were experiencing some recent financial strain. Patient's spouse continues to work.  . Patient reports that her anxiety has increased so she has been implementing the rubber band trick that CCM LCSW educated her on to help with her racing/negative thoughts that trigger from her OCD and anxiety. Patient reports that she is still going out of the house on her own to get socialization and run errands. She reports that she has been talking with several of her church friends which has helped with her loneliness. Patient reports that her bowel movement has improved which has a relief for her. She states that she is eating well and drinking plenty of fluids.    Patient Self Care Activities:  . Attends all scheduled provider appointments . Lacks social connections  Please see past updates related to this goal by clicking on the "Past Updates" button in the selected goal         Dickie La, [place], MSW, LCSW [facility]  [facility] [email or web address] Phone: [phone]

## 2020-06-17 ENCOUNTER — Telehealth: Payer: Self-pay

## 2020-06-17 ENCOUNTER — Ambulatory Visit (INDEPENDENT_AMBULATORY_CARE_PROVIDER_SITE_OTHER): Payer: Medicare HMO | Admitting: Licensed Clinical Social Worker

## 2020-06-17 DIAGNOSIS — I10 Essential (primary) hypertension: Secondary | ICD-10-CM | POA: Diagnosis not present

## 2020-06-17 DIAGNOSIS — F319 Bipolar disorder, unspecified: Secondary | ICD-10-CM

## 2020-06-17 DIAGNOSIS — F418 Other specified anxiety disorders: Secondary | ICD-10-CM

## 2020-06-17 DIAGNOSIS — F419 Anxiety disorder, unspecified: Secondary | ICD-10-CM

## 2020-06-17 NOTE — Patient Instructions (Signed)
Licensed Clinical Social Worker Visit Information  Goals we discussed today:  Goals Addressed            This Visit's Progress   . SW-Track and Manage My Symptoms-Depression        Timeframe:  Long-Range Goal Priority:  Medium Start Date:  05/06/20                         Expected End Date:  08/03/20                     Follow Up Date - 07/23/20   - avoid negative self-talk - develop a personal safety plan - develop a plan to deal with triggers like holidays, anniversaries - exercise at least 2 to 3 times per week - have a plan for how to handle bad days - journal feelings and what helps to feel better or worse - spend time or talk with others at least 2 to 3 times per week - spend time or talk with others every day - watch for early signs of feeling worse - write in journal every day    Why is this important?    Keeping track of your progress will help your treatment team find the right mix of medicine and therapy for you.   Write in your journal every day.   Day-to-day changes in depression symptoms are normal. It may be more helpful to check your progress at the end of each week instead of every day.    Current Barriers:  . Limited social support . Mental Health Concerns  . Social Isolation . Limited access to caregiver . Lacks knowledge of community resource: available mental health resources that she can utilize   Clinical Social Work Clinical Goal(s):  Marland Kitchen Over the next 120 days, client will work with SW to address concerns related to decreasing anxious symptoms by implementing additional mental health support and coping skills into her routine.   Interventions: . Patient interviewed and appropriate assessments performed. Patient reports being very stable at this time. . Patient reports that she is struggling with her sinuses at this time. She reports that she will try to take some allergy medication today to alleviate her symptoms. CCM LSCW provided eduction on  healthy self-care. Patient was receptive to education provided.  . Patient reports that she has an appointment with the new physician at [facility] on 07/11/20. She reports that her son also has an appointment after her. Patient confirms stable transportation to this upcoming appointment.  . Patient was informed that current CCM LCSW will be leaving position next month and her next CCM Social Work follow up visit will be with another LCSW. Patient was appreciative of support provided and receptive to news . Patient was interested in Print production planner education. CCM LCSW provided education on local senior centers that offer daily activities and socialization such as [facility], [employer] and [facility]. CCM LCSW encouraged patient to contact [facility] to gain additional resource assistance and support.  . Patient reports that her anxiety increases with isolation. Patient was provided coping skill education on anxiety management. Patient was educated on healthy self-care tools to implement into her daily routine. Patient reports that she is able to swallow better now. Patient is trying to get out of the house more on her own. She recently gained socialization when she got out to meet a friend for a hot dog and was  able to gain good conversation and activity.  . Provided mental health counseling with regard daily difficulty with depression/isolation/anxiety/mobility/weakness/balance. LCSW used active and reflective listening and implemented appropriate interventions to help suppport patient and her emotional needs. Advised patient to implement deep breathing/grounding/meditation/self-care exercises into her daily routine to combat stressors. Education provided.  . Discussed plans with patient for ongoing care management follow up and provided patient with direct contact information for care management team . Advised patient to implement deep breathing exercises into her routine  whenever anxiety/stress arise to help bring her back to the present state.  . Assisted patient/caregiver with obtaining information about health plan benefits . Provided education and assistance to client regarding Advanced Directives. . Provided education to patient/caregiver regarding level of care options. . Encouraged patient to consider [facility] (mental health provider) for long term follow up and therapy/counseling. PCP made a referral in the past. LCSW provided patient with their contact information and encouraged her to contact them but patient declined services as she reports that her anxiety and depression is being managed. She has been actively implementing healthy coping skills into her daily routine to combat her negative thinking. Positive reinforcement provided for this success. . Patient reports improvement with her bowels. . Patient reports that she has a strong social support network of family and friends. . Patient shares that her stomach issues trigger when anxiety arises. She continues to take stool softeners when needed. LCSW provided coping skill education for anxiety management. Patient reports that her anxiety is more tolerable now but admits that her depression can be easily triggered. Depression management and coping skill education provided to patient today. Patient was receptive to education provided. . Patient reports that she has been implementing more socialization into her routine which has been helpful. She reports that the cold weather does affect her mood but she still makes it a priority to get out of the house often. Patient reports that she goes to her favorite restaurant to meet her friends once or twice a month. She reports that she went to breaskfast this morning with her son. Also, patient goes out to eat with her spouse weekly on Wednesdays. LCSW encouraged patient to implement additional self-care and socialization activities in order to improve  her mood as she often stays inside at home throughout the day. Patient reports that she will start to get out of the house more often to run errands and get vitamin D. Patient has several projects that she works on at home. She reports that she successfully made Christmas Cards for her family this year.  . Praise provided as patient has put in a lot of work by challenging her negative thinking.  Positive reinforcement provided. Patient has been implementing healthy self care into her daily routine to cope with anxiety and depression. Patient reports that she has been taking care of several plants in her home which she is very proud of. Patient shares that she is keeping up with all of her responsibilities such as paying bills on time, eating healthy, taking medications, washing clothes, etc. . Patient's son is her primary caregiver and he lives in the home. He has some developmental disabilities and has never lived on his own but is very helpful per patient. Patient shares that he works 2 1/2 days per week.  . Patient shares that she received an injection in her knee by Dr. Odis Luster and it has helped with her mobility. She will get these injections every 3-4 months when  needed.  . Patient denies any urgent concerns and reports that her SOB has improved. . Patient reports having a successful hernia replacement surgery. Patient continues to keep a close eye on this recovery process.  . Patient confirms that she is taking her medication as prescribed, eating more protein since her surgery and drinking more fluids in order to increase her overall self -care. Patient declines experiencing any recent nausea since medication was prescribed. Patient will take this medication when nausea symptoms arise.  . Patient reports that her cousin is a great support to her and she considers her weekly conversations with her as her very own therapy. Patient reports that she talks to her sister in law, cousin and son about her OCD  as it gets triggered every year when it turns cold. Patient shares that her pain gets triggered by the cold as well. LCSW was educated on the "rubber band' method. She reports using this strategy in the past. When patient is experiencing obsessive thoughts she will pop her wrist to provide a distraction from that fixation. Coping skill education provided specifically for OCD. Marland Kitchen Patient shares that she continues to drive herself to all appointments. Patient had car worked on last week. Patient reports that she is thankful that she is still able to drive safely as getting out of the house is beneficial to her mood.  . Patient reports that her and her spouses' income has increased which is a great relief to her family as they were experiencing some recent financial strain. Patient's spouse continues to work.  . Patient reports that her anxiety has increased so she has been implementing the rubber band trick that CCM LCSW educated her on to help with her racing/negative thoughts that trigger from her OCD and anxiety. Patient reports that she is still going out of the house on her own to get socialization and run errands. She reports that she has been talking with several of her church friends which has helped with her loneliness. Patient reports that her bowel movement has improved which has a relief for her. She states that she is eating well and drinking plenty of fluids.    Patient Self Care Activities:  . Attends all scheduled provider appointments . Lacks social connections  Please see past updates related to this goal by clicking on the "Past Updates" button in the selected goal         Dickie La, [place], MSW, LCSW [facility]  [facility] [email or web address] Phone: [phone]

## 2020-06-17 NOTE — Chronic Care Management (AMB) (Signed)
Chronic Care Management    Clinical Social Work Note  06/17/2020 Name: Katrina Sutton MRN: [id number] DOB: Feb 23, 1951  Katrina Sutton is a 70 y.o. year old female who is a primary care patient of Verl Bangs, FNP. The CCM team was consulted to assist the patient with chronic disease management and/or care coordination needs related to: Level of Care Concerns.   Engaged with patient by telephone for follow up visit in response to provider referral for social work chronic care management and care coordination services.   Consent to Services:  The patient was given the following information about Chronic Care Management services today, agreed to services, and gave verbal consent: 1. CCM service includes personalized support from designated clinical staff supervised by the primary care provider, including individualized plan of care and coordination with other care providers 2. 24/7 contact phone numbers for assistance for urgent and routine care needs. 3. Service will only be billed when office clinical staff spend 20 minutes or more in a month to coordinate care. 4. Only one practitioner may furnish and bill the service in a calendar month. 5.The patient may stop CCM services at any time (effective at the end of the month) by phone call to the office staff. 6. The patient will be responsible for cost sharing (co-pay) of up to 20% of the service fee (after annual deductible is met). Patient agreed to services and consent obtained.  Patient agreed to services and consent obtained.   Assessment: Review of patient past medical history, allergies, medications, and health status, including review of relevant consultants reports was performed today as part of a comprehensive evaluation and provision of chronic care management and care coordination services.     SDOH (Social Determinants of Health) assessments and interventions performed:    Advanced Directives Status: Not addressed in this encounter.  CCM  Care Plan  Allergies  Allergen Reactions  . Erythromycin Shortness Of Breath and Rash  . Duloxetine Palpitations    Outpatient Encounter Medications as of 06/17/2020  Medication Sig Note  . acetaminophen (TYLENOL) 500 MG tablet Take 500 mg by mouth every 8 (eight) hours as needed for headache.   . clonazePAM (KLONOPIN) 1 MG tablet TAKE 1 TABLET BY MOUTH TWICE A DAY AS NEEDED FOR ANXIETY.   . hydrocortisone cream 1 % Apply 1 application topically 2 (two) times daily as needed for itching.  06/19/2019: Very seldom   . linaclotide (LINZESS) 145 MCG CAPS capsule Take 1 capsule (145 mcg total) by mouth daily before breakfast.   . omeprazole (PRILOSEC) 40 MG capsule Take 1 capsule (40 mg total) by mouth every morning.   Marland Kitchen PARoxetine (PAXIL) 40 MG tablet Take 1 tablet (40 mg total) by mouth daily.    No facility-administered encounter medications on file as of 06/17/2020.    Patient Active Problem List   Diagnosis Date Noted  . Intestinal obstruction due to recurrent umbilical hernia 56/97/9480  . SBO (small bowel obstruction) ([place]) 10/26/2019  . Recurrent ventral hernia 10/26/2019  . AKI (acute kidney injury) ([place]) 10/26/2019  . Hospital discharge follow-up 10/25/2019  . Hypokalemia 10/25/2019  . Hypertension 08/30/2019  . Leg swelling 05/08/2019  . Acute respiratory failure with hypoxia ([place]) 05/08/2019  . Paraesophageal hernia 05/08/2019  . Constipation 04/17/2019  . S/P umbilical hernia repair, follow-up exam 06/21/2018  . S/P laparoscopic cholecystectomy 05/31/2018  . Gallstone pancreatitis   . Umbilical hernia   . Dyspnea 03/11/2018  . Bipolar 1 disorder ([place]) 08/23/2017  .  GERD (gastroesophageal reflux disease) 12/27/2016  . Arthritis 12/27/2016  . Anxiety 12/27/2016  . Depression with anxiety 12/27/2016  . Seasonal allergic rhinitis 12/27/2016    Conditions to be addressed/monitored: Anxiety; Limited social support, ADL IADL limitations, Mental Health Concerns  and Social  Isolation  Care Plan : General Social Work (Adult)  Updates made by Greg Cutter, LCSW since 06/17/2020 12:00 AM    Problem: Coping Skills (General Plan of Care)     Long-Range Goal: Coping Skills Enhanced   Start Date: 05/06/2020  Recent Progress: On track  Priority: Medium  Note:    Timeframe:  Long-Range Goal Priority:  Medium Start Date:  05/06/20                         Expected End Date:  08/03/20                     Follow Up Date - 07/23/20   - avoid negative self-talk - develop a personal safety plan - develop a plan to deal with triggers like holidays, anniversaries - exercise at least 2 to 3 times per week - have a plan for how to handle bad days - journal feelings and what helps to feel better or worse - spend time or talk with others at least 2 to 3 times per week - spend time or talk with others every day - watch for early signs of feeling worse - write in journal every day    Why is this important?    Keeping track of your progress will help your treatment team find the right mix of medicine and therapy for you.   Write in your journal every day.   Day-to-day changes in depression symptoms are normal. It may be more helpful to check your progress at the end of each week instead of every day.    Current Barriers:  . Limited social support . Mental Health Concerns  . Social Isolation . Limited access to caregiver . Lacks knowledge of community resource: available mental health resources that she can utilize   Clinical Social Work Clinical Goal(s):  Marland Kitchen Over the next 120 days, client will work with SW to address concerns related to decreasing anxious symptoms by implementing additional mental health support and coping skills into her routine.   Interventions: . Patient interviewed and appropriate assessments performed. Patient reports being very stable at this time. . Patient reports that she is struggling with her sinuses at this time. She reports that she  will try to take some allergy medication today to alleviate her symptoms. CCM LSCW provided eduction on healthy self-care. Patient was receptive to education provided.  . Patient reports that she has an appointment with the new physician at [facility] on 07/11/20. She reports that her son also has an appointment after her. Patient confirms stable transportation to this upcoming appointment.  . Patient was informed that current CCM LCSW will be leaving position next month and her next CCM Social Work follow up visit will be with another LCSW. Patient was appreciative of support provided and receptive to news . Patient was interested in Pharmacist, hospital education. CCM LCSW provided education on local senior centers that offer daily activities and socialization such as [place] and [employer]. CCM LCSW encouraged patient to contact [facility] to gain additional resource assistance and support.  . Patient reports that her anxiety increases with isolation. Patient was  provided coping skill education on anxiety management. Patient was educated on healthy self-care tools to implement into her daily routine. Patient reports that she is able to swallow better now. Patient is trying to get out of the house more on her own. She recently gained socialization when she got out to meet a friend for a hot dog and was able to gain good conversation and activity.  . Provided mental health counseling with regard daily difficulty with depression/isolation/anxiety/mobility/weakness/balance. LCSW used active and reflective listening and implemented appropriate interventions to help suppport patient and her emotional needs. Advised patient to implement deep breathing/grounding/meditation/self-care exercises into her daily routine to combat stressors. Education provided.  . Discussed plans with patient for ongoing care management follow up and provided patient with direct contact information  for care management team . Advised patient to implement deep breathing exercises into her routine whenever anxiety/stress arise to help bring her back to the present state.  . Assisted patient/caregiver with obtaining information about health plan benefits . Provided education and assistance to client regarding Advanced Directives. . Provided education to patient/caregiver regarding level of care options. . Encouraged patient to consider [facility] (mental health provider) for long term follow up and therapy/counseling. PCP made a referral in the past. LCSW provided patient with their contact information and encouraged her to contact them but patient declined services as she reports that her anxiety and depression is being managed. She has been actively implementing healthy coping skills into her daily routine to combat her negative thinking. Positive reinforcement provided for this success. . Patient reports improvement with her bowels. . Patient reports that she has a strong social support network of family and friends. . Patient shares that her stomach issues trigger when anxiety arises. She continues to take stool softeners when needed. LCSW provided coping skill education for anxiety management. Patient reports that her anxiety is more tolerable now but admits that her depression can be easily triggered. Depression management and coping skill education provided to patient today. Patient was receptive to education provided. . Patient reports that she has been implementing more socialization into her routine which has been helpful. She reports that the cold weather does affect her mood but she still makes it a priority to get out of the house often. Patient reports that she goes to her favorite restaurant to meet her friends once or twice a month. She reports that she went to breaskfast this morning with her son. Also, patient goes out to eat with her spouse weekly on Wednesdays. LCSW  encouraged patient to implement additional self-care and socialization activities in order to improve her mood as she often stays inside at home throughout the day. Patient reports that she will start to get out of the house more often to run errands and get vitamin D. Patient has several projects that she works on at home. She reports that she successfully made Christmas Cards for her family this year.  . Praise provided as patient has put in a lot of work by challenging her negative thinking.  Positive reinforcement provided. Patient has been implementing healthy self care into her daily routine to cope with anxiety and depression. Patient reports that she has been taking care of several plants in her home which she is very proud of. Patient shares that she is keeping up with all of her responsibilities such as paying bills on time, eating healthy, taking medications, washing clothes, etc. . Patient's son is her primary caregiver and he lives in  the home. He has some developmental disabilities and has never lived on his own but is very helpful per patient. Patient shares that he works 2 1/2 days per week.  . Patient shares that she received an injection in her knee by Dr. Harlow Mares and it has helped with her mobility. She will get these injections every 3-4 months when needed.  . Patient denies any urgent concerns and reports that her SOB has improved. . Patient reports having a successful hernia replacement surgery. Patient continues to keep a close eye on this recovery process.  . Patient confirms that she is taking her medication as prescribed, eating more protein since her surgery and drinking more fluids in order to increase her overall self -care. Patient declines experiencing any recent nausea since medication was prescribed. Patient will take this medication when nausea symptoms arise.  . Patient reports that her cousin is a great support to her and she considers her weekly conversations with her as  her very own therapy. Patient reports that she talks to her sister in law, cousin and son about her OCD as it gets triggered every year when it turns cold. Patient shares that her pain gets triggered by the cold as well. LCSW was educated on the "rubber band' method. She reports using this strategy in the past. When patient is experiencing obsessive thoughts she will pop her wrist to provide a distraction from that fixation. Coping skill education provided specifically for OCD. Marland Kitchen Patient shares that she continues to drive herself to all appointments. Patient had car worked on last week. Patient reports that she is thankful that she is still able to drive safely as getting out of the house is beneficial to her mood.  . Patient reports that her and her spouses' income has increased which is a great relief to her family as they were experiencing some recent financial strain. Patient's spouse continues to work.  . Patient reports that her anxiety has increased so she has been implementing the rubber band trick that CCM LCSW educated her on to help with her racing/negative thoughts that trigger from her OCD and anxiety. Patient reports that she is still going out of the house on her own to get socialization and run errands. She reports that she has been talking with several of her church friends which has helped with her loneliness. Patient reports that her bowel movement has improved which has a relief for her. She states that she is eating well and drinking plenty of fluids.    Patient Self Care Activities:  . Attends all scheduled provider appointments . Lacks social connections  Please see past updates related to this goal by clicking on the "Past Updates" button in the selected goal        Follow Up Plan: SW will follow up with patient by phone over the next 45 days      Eula Fried, [employer], MSW, [email or web address] Phone: [phone]

## 2020-06-19 ENCOUNTER — Ambulatory Visit: Payer: Medicare HMO | Admitting: Physician Assistant

## 2020-06-24 ENCOUNTER — Ambulatory Visit (INDEPENDENT_AMBULATORY_CARE_PROVIDER_SITE_OTHER): Payer: Medicare HMO

## 2020-06-24 VITALS — Ht 66.0 in | Wt 150.0 lb

## 2020-06-24 DIAGNOSIS — Z0000 Encounter for general adult medical examination without abnormal findings: Secondary | ICD-10-CM

## 2020-06-24 NOTE — Patient Instructions (Signed)
Katrina Sutton , Thank you for taking time to come for your Medicare Wellness Visit. I appreciate your ongoing commitment to your health goals. Please review the following plan we discussed and let me know if I can assist you in the future.   Screening recommendations/referrals: Colonoscopy: decline Mammogram: decline Bone Density: decline Recommended yearly ophthalmology/optometry visit for glaucoma screening and checkup Recommended yearly dental visit for hygiene and checkup  Vaccinations: Influenza vaccine: decline Pneumococcal vaccine: decline Tdap vaccine:  Completed 09/13/2015, due 09/12/2025 Shingles vaccine: discussed   Covid-19: decline  Advanced directives:  Advance directive discussed with you today.   Conditions/risks identified: none  Next appointment: Follow up in one year for your annual wellness visit    Preventive Care 65 Years and Older, Female Preventive care refers to lifestyle choices and visits with your health care provider that can promote health and wellness. What does preventive care include?  A yearly physical exam. This is also called an annual well check.  Dental exams once or twice a year.  Routine eye exams. Ask your health care provider how often you should have your eyes checked.  Personal lifestyle choices, including:  Daily care of your teeth and gums.  Regular physical activity.  Eating a healthy diet.  Avoiding tobacco and drug use.  Limiting alcohol use.  Practicing safe sex.  Taking low-dose aspirin every day.  Taking vitamin and mineral supplements as recommended by your health care provider. What happens during an annual well check? The services and screenings done by your health care provider during your annual well check will depend on your age, overall health, lifestyle risk factors, and family history of disease. Counseling  Your health care provider may ask you questions about your:  Alcohol use.  Tobacco use.  Drug  use.  Emotional well-being.  Home and relationship well-being.  Sexual activity.  Eating habits.  History of falls.  Memory and ability to understand (cognition).  Work and work Astronomer.  Reproductive health. Screening  You may have the following tests or measurements:  Height, weight, and BMI.  Blood pressure.  Lipid and cholesterol levels. These may be checked every 5 years, or more frequently if you are over 3 years old.  Skin check.  Lung cancer screening. You may have this screening every year starting at age 21 if you have a 30-pack-year history of smoking and currently smoke or have quit within the past 15 years.  Fecal occult blood test (FOBT) of the stool. You may have this test every year starting at age 30.  Flexible sigmoidoscopy or colonoscopy. You may have a sigmoidoscopy every 5 years or a colonoscopy every 10 years starting at age 73.  Hepatitis C blood test.  Hepatitis B blood test.  Sexually transmitted disease (STD) testing.  Diabetes screening. This is done by checking your blood sugar (glucose) after you have not eaten for a while (fasting). You may have this done every 1-3 years.  Bone density scan. This is done to screen for osteoporosis. You may have this done starting at age 61.  Mammogram. This may be done every 1-2 years. Talk to your health care provider about how often you should have regular mammograms. Talk with your health care provider about your test results, treatment options, and if necessary, the need for more tests. Vaccines  Your health care provider may recommend certain vaccines, such as:  Influenza vaccine. This is recommended every year.  Tetanus, diphtheria, and acellular pertussis (Tdap, Td) vaccine. You may need  a Td booster every 10 years.  Zoster vaccine. You may need this after age 5.  Pneumococcal 13-valent conjugate (PCV13) vaccine. One dose is recommended after age 5.  Pneumococcal polysaccharide  (PPSV23) vaccine. One dose is recommended after age 81. Talk to your health care provider about which screenings and vaccines you need and how often you need them. This information is not intended to replace advice given to you by your health care provider. Make sure you discuss any questions you have with your health care provider. Document Released: 03/28/2015 Document Revised: 11/19/2015 Document Reviewed: 12/31/2014 Elsevier Interactive Patient Education  2017 [employer] Prevention in the Home Falls can cause injuries. They can happen to people of all ages. There are many things you can do to make your home safe and to help prevent falls. What can I do on the outside of my home?  Regularly fix the edges of walkways and driveways and fix any cracks.  Remove anything that might make you trip as you walk through a door, such as a raised step or threshold.  Trim any bushes or trees on the path to your home.  Use bright outdoor lighting.  Clear any walking paths of anything that might make someone trip, such as rocks or tools.  Regularly check to see if handrails are loose or broken. Make sure that both sides of any steps have handrails.  Any raised decks and porches should have guardrails on the edges.  Have any leaves, snow, or ice cleared regularly.  Use sand or salt on walking paths during winter.  Clean up any spills in your garage right away. This includes oil or grease spills. What can I do in the bathroom?  Use night lights.  Install grab bars by the toilet and in the tub and shower. Do not use towel bars as grab bars.  Use non-skid mats or decals in the tub or shower.  If you need to sit down in the shower, use a plastic, non-slip stool.  Keep the floor dry. Clean up any water that spills on the floor as soon as it happens.  Remove soap buildup in the tub or shower regularly.  Attach bath mats securely with double-sided non-slip rug tape.  Do not have  throw rugs and other things on the floor that can make you trip. What can I do in the bedroom?  Use night lights.  Make sure that you have a light by your bed that is easy to reach.  Do not use any sheets or blankets that are too big for your bed. They should not hang down onto the floor.  Have a firm chair that has side arms. You can use this for support while you get dressed.  Do not have throw rugs and other things on the floor that can make you trip. What can I do in the kitchen?  Clean up any spills right away.  Avoid walking on wet floors.  Keep items that you use a lot in easy-to-reach places.  If you need to reach something above you, use a strong step stool that has a grab bar.  Keep electrical cords out of the way.  Do not use floor polish or wax that makes floors slippery. If you must use wax, use non-skid floor wax.  Do not have throw rugs and other things on the floor that can make you trip. What can I do with my stairs?  Do not leave any items on the  stairs.  Make sure that there are handrails on both sides of the stairs and use them. Fix handrails that are broken or loose. Make sure that handrails are as long as the stairways.  Check any carpeting to make sure that it is firmly attached to the stairs. Fix any carpet that is loose or worn.  Avoid having throw rugs at the top or bottom of the stairs. If you do have throw rugs, attach them to the floor with carpet tape.  Make sure that you have a light switch at the top of the stairs and the bottom of the stairs. If you do not have them, ask someone to add them for you. What else can I do to help prevent falls?  Wear shoes that:  Do not have high heels.  Have rubber bottoms.  Are comfortable and fit you well.  Are closed at the toe. Do not wear sandals.  If you use a stepladder:  Make sure that it is fully opened. Do not climb a closed stepladder.  Make sure that both sides of the stepladder are  locked into place.  Ask someone to hold it for you, if possible.  Clearly mark and make sure that you can see:  Any grab bars or handrails.  First and last steps.  Where the edge of each step is.  Use tools that help you move around (mobility aids) if they are needed. These include:  Canes.  Walkers.  Scooters.  Crutches.  Turn on the lights when you go into a dark area. Replace any light bulbs as soon as they burn out.  Set up your furniture so you have a clear path. Avoid moving your furniture around.  If any of your floors are uneven, fix them.  If there are any pets around you, be aware of where they are.  Review your medicines with your doctor. Some medicines can make you feel dizzy. This can increase your chance of falling. Ask your doctor what other things that you can do to help prevent falls. This information is not intended to replace advice given to you by your health care provider. Make sure you discuss any questions you have with your health care provider. Document Released: 12/26/2008 Document Revised: 08/07/2015 Document Reviewed: 04/05/2014 Elsevier Interactive Patient Education  2017 [employer].

## 2020-06-24 NOTE — Progress Notes (Signed)
I connected with Briauna Evetts today by telephone and verified that I am speaking with the correct person using two identifiers. Location patient: home Location provider: work Persons participating in the virtual visit: Leontyne Yong Channel LPN.   I discussed the limitations, risks, security and privacy concerns of performing an evaluation and management service by telephone and the availability of in person appointments. I also discussed with the patient that there may be a patient responsible charge related to this service. The patient expressed understanding and verbally consented to this telephonic visit.    Interactive audio and video telecommunications were attempted between this provider and patient, however failed, due to patient having technical difficulties OR patient did not have access to video capability.  We continued and completed visit with audio only.     Vital signs may be patient reported or missing.  Subjective:   Katrina Sutton is a 70 y.o. female who presents for Medicare Annual (Subsequent) preventive examination.  Review of Systems     Cardiac Risk Factors include: advanced age (>68men, >53 women);hypertension;sedentary lifestyle     Objective:    Today's Vitals   06/24/20 1451  Weight: 150 lb (68 kg)  Height:  (1.676 m)   Body mass index is 24.21 kg/m.  Advanced Directives 06/24/2020 10/27/2019 10/26/2019 10/12/2019 10/11/2019 06/19/2019 05/09/2019  Does Patient Have a Medical Advance Directive? No No No - No No No  Would patient like information on creating a medical advance directive? - No - Patient declined No - Patient declined No - Patient declined - - No - Patient declined    Current Medications (verified) Outpatient Encounter Medications as of 06/24/2020  Medication Sig  . acetaminophen (TYLENOL) 500 MG tablet Take 500 mg by mouth every 8 (eight) hours as needed for headache.  . clonazePAM (KLONOPIN) 1 MG tablet TAKE 1 TABLET BY MOUTH TWICE A DAY  AS NEEDED FOR ANXIETY.  . hydrocortisone cream 1 % Apply 1 application topically 2 (two) times daily as needed for itching.   Marland Kitchen omeprazole (PRILOSEC) 40 MG capsule Take 1 capsule (40 mg total) by mouth every morning.  Marland Kitchen PARoxetine (PAXIL) 40 MG tablet Take 1 tablet (40 mg total) by mouth daily.  Marland Kitchen linaclotide (LINZESS) 145 MCG CAPS capsule Take 1 capsule (145 mcg total) by mouth daily before breakfast.   No facility-administered encounter medications on file as of 06/24/2020.    Allergies (verified) Erythromycin and Duloxetine   History: Past Medical History:  Diagnosis Date  . Allergy    seasonal  . Anxiety   . Arthritis    osteoarthritis  . Depression   . GERD (gastroesophageal reflux disease)   . Hiatal hernia    Past Surgical History:  Procedure Laterality Date  . CHOLECYSTECTOMY N/A 05/31/2018   Procedure: LAPAROSCOPIC CHOLECYSTECTOMY;  Surgeon: Duanne Guess, MD;  Location: [facility] ORS;  Service: General;  Laterality: N/A;  . TONSILLECTOMY    . UMBILICAL HERNIA REPAIR N/A 05/31/2018   Procedure: HERNIA REPAIR UMBILICAL ADULT;  Surgeon: Duanne Guess, MD;  Location: [facility] ORS;  Service: General;  Laterality: N/A;  . UMBILICAL HERNIA REPAIR N/A 10/31/2019   Procedure: HERNIA REPAIR UMBILICAL ADULT;  Surgeon: Duanne Guess, MD;  Location: [facility] ORS;  Service: General;  Laterality: N/A;  . WRIST SURGERY Left    Family History  Problem Relation Age of Onset  . Mental illness Mother   . Ulcers Father   . Stroke Neg Hx   . Heart attack Neg Hx   .  Cancer Neg Hx    Social History   Socioeconomic History  . Marital status: Married    Spouse name: Not on file  . Number of children: Not on file  . Years of education: Not on file  . Highest education level: Not on file  Occupational History  . Occupation: retired  Tobacco Use  . Smoking status: Former Smoker    Quit date: 12/27/1976    Years since quitting: 43.5  . Smokeless tobacco: Never Used  Vaping Use  .  Vaping Use: Never used  Substance and Sexual Activity  . Alcohol use: No  . Drug use: No  . Sexual activity: Not Currently    Birth control/protection: None  Other Topics Concern  . Not on file  Social History Narrative  . Not on file   Social Determinants of Health   Financial Resource Strain: Low Risk   . Difficulty of Paying Living Expenses: Not hard at all  Food Insecurity: No Food Insecurity  . Worried About Programme researcher, broadcasting/film/video in the Last Year: Never true  . Ran Out of Food in the Last Year: Never true  Transportation Needs: No Transportation Needs  . Lack of Transportation (Medical): No  . Lack of Transportation (Non-Medical): No  Physical Activity: Inactive  . Days of Exercise per Week: 0 days  . Minutes of Exercise per Session: 0 min  Stress: Stress Concern Present  . Feeling of Stress : To some extent  Social Connections: Moderately Isolated  . Frequency of Communication with Friends and Family: More than three times a week  . Frequency of Social Gatherings with Friends and Family: More than three times a week  . Attends Religious Services: Never  . Active Member of Clubs or Organizations: No  . Attends Banker Meetings: Never  . Marital Status: Married    Tobacco Counseling Counseling given: Not Answered   Clinical Intake:  Pre-visit preparation completed: Yes  Pain : No/denies pain     Nutritional Status: BMI of 19-24  Normal Nutritional Risks: None Diabetes: No  How often do you need to have someone help you when you read instructions, pamphlets, or other written materials from your doctor or pharmacy?: 1 - Never What is the last grade level you completed in school?: 12th grade  Diabetic? no  Interpreter Needed?: No  Information entered by :: NAllen LPN   Activities of Daily Living In your present state of health, do you have any difficulty performing the following activities: 06/24/2020 10/27/2019  Hearing? Y N  Comment deaf in  right ear -  Vision? N N  Difficulty concentrating or making decisions? N N  Walking or climbing stairs? N N  Dressing or bathing? N N  Doing errands, shopping? N N  Preparing Food and eating ? N -  Using the Toilet? N -  In the past six months, have you accidently leaked urine? N -  Do you have problems with loss of bowel control? N -  Managing your Medications? N -  Managing your Finances? N -  Housekeeping or managing your Housekeeping? N -  Some recent data might be hidden    Patient Care Team: Malfi, Jodelle Gross, FNP as PCP - General (Family Medicine) Gustavus Bryant, LCSW as Social Worker (Licensed Clinical Social Worker) Arlana Pouch, Megan Mans, RN as Case Manager (General Practice) Dhalla, Jackelyn Poling, RPH-CPP as Pharmacist (Pharmacist)  Indicate any recent Medical Services you may have received from other than [facility] providers in  the past year (date may be approximate).     Assessment:   This is a routine wellness examination for Meliss.  Hearing/Vision screen No exam data present  Dietary issues and exercise activities discussed: Current Exercise Habits: The patient does not participate in regular exercise at present  Goals    .  Patient Stated      06/24/2020, stay healthy    .  SW- I would like to gain additional support to manage my anxiety better (pt-stated)      Current Barriers:  . Limited social support . Mental Health Concerns  . Social Isolation . Limited access to caregiver . Lacks knowledge of community resource: available mental health resources that she can utilize   Clinical Social Work Clinical Goal(s):  Marland Kitchen Over the next 120 days, client will work with SW to address concerns related to decreasing anxious symptoms by implementing additional mental health support and coping skills into her routine.   Interventions: . Patient interviewed and appropriate assessments performed . Provided mental health counseling with regard daily difficulty with  depression/isolation/mobility/weakness/balance. LCSW used active and reflective listening and implemented appropriate interventions to help suppport patient and her emotional needs. Advised patient to implement deep breathing/grounding/meditation/self-care exercises into her daily routine to combat stressors. Education provided.  . Discussed plans with patient for ongoing care management follow up and provided patient with direct contact information for care management team . Advised patient to implement deep breathing exercises into her routine whenever anxiety/stress arise to help bring her back to the present state.  . Assisted patient/caregiver with obtaining information about health plan benefits . Provided education and assistance to client regarding Advanced Directives. . Provided education to patient/caregiver regarding level of care options. . Encouraged patient to consider [facility] (mental health provider) for long term follow up and therapy/counseling. PCP made a referral in the past. LCSW provided patient with their contact information and encouraged her to contact them but patient declined services as she reports that her anxiety has improved during phone call on 01/17/20. Marland Kitchen Patient reports improvement with her bowels. . Patient reports that she has a strong social support network of family and friends. . Patient shares that her stomach issues trigger when anxiety arises. She continues to take stool softeners when needed. LCSW provided coping skill education for anxiety management. Patient reports that her anxiety is more tolerable now but her depression gets to her at times. Depression management and coping skill education provided to patient today. Patient was receptive to education provided. . Patient reports that she has been implementing more socialization into her routine which has been helpful. Patient reports that she goes to her favorite  restaurant to meet her  friends once or twice a month. LCSW encouraged patient to implement additional self-care and socialization activities in order to improve her mood as she often stays inside at home throughout the day. Patient reports that she will start to get out of the house more often to run errands and get vitamin D.  . Praise provided as patient has put in a lot of work by challenging her negative thinking.  Positive reinforcement provided. Patient has been implementing healthy self care into her daily routine to cope with anxiety and depression. Patient reports that she has been taking care of several plants in her home which she is very proud of. Patient shares that she is keeping up with all of her responsibilities such as paying bills on time, eating healthy, taking medications, washing clothes,  etc. . Patient's son is her primary caregiver and he lives in the home. He has some developmental disabilities and has never lived on her own but is very helpful per patient. Patient shares that he works 2 1/2 days per week.    . Patient shares that she received an injection in her knee by Dr. Odis Luster and it has helped with her mobility. She will get these injections every 3-4 months when needed.  . Patient denies any urgent concerns and reports that her SOB has improved. . Patient reports having a successful hernia replacement surgery. She stable transportation to follow up appointment for this on 11/20/19. Marland Kitchen Patient confirms that she is taking her medication as prescribed, eating more protein since her surgery and drinking more fluids in order to increase her overall self -care. Patient declines experiencing any recent nausea since medication was prescribed. Patient will take this medication when nausea symptoms arise.  . Patient reports that she talks to her sister in law and son about her OCD as it gets triggered every year when it turns cold. Patient shares that her pain gets triggered by the cold as well. LCSW was educated  on the "rubber band' method. She reports using this strategy in the past. When patient is experiencing obsessive thoughts she will pop her wrist to provide a distraction from that fixation. Cooing skill education provided specifically for OCD. Marland Kitchen Patient shares that she continues to drive herself to all appointments.   Patient Self Care Activities:  . Attends all scheduled provider appointments . Lacks social connections  Please see past updates related to this goal by clicking on the "Past Updates" button in the selected goal      .  SW-Track and Manage My Symptoms-Depression       Timeframe:  Long-Range Goal Priority:  Medium Start Date:  05/06/20                         Expected End Date:  08/03/20                     Follow Up Date - 07/23/20   - avoid negative self-talk - develop a personal safety plan - develop a plan to deal with triggers like holidays, anniversaries - exercise at least 2 to 3 times per week - have a plan for how to handle bad days - journal feelings and what helps to feel better or worse - spend time or talk with others at least 2 to 3 times per week - spend time or talk with others every day - watch for early signs of feeling worse - write in journal every day    Why is this important?    Keeping track of your progress will help your treatment team find the right mix of medicine and therapy for you.   Write in your journal every day.   Day-to-day changes in depression symptoms are normal. It may be more helpful to check your progress at the end of each week instead of every day.    Current Barriers:  . Limited social support . Mental Health Concerns  . Social Isolation . Limited access to caregiver . Lacks knowledge of community resource: available mental health resources that she can utilize   Clinical Social Work Clinical Goal(s):  Marland Kitchen Over the next 120 days, client will work with SW to address concerns related to decreasing anxious symptoms by  implementing additional mental health support  and coping skills into her routine.   Interventions: . Patient interviewed and appropriate assessments performed. Patient reports being very stable at this time. . Patient reports that she is struggling with her sinuses at this time. She reports that she will try to take some allergy medication today to alleviate her symptoms. CCM LSCW provided eduction on healthy self-care. Patient was receptive to education provided.  . Patient reports that she has an appointment with the new physician at [facility] on 07/11/20. She reports that her son also has an appointment after her. Patient confirms stable transportation to this upcoming appointment.  . Patient was informed that current CCM LCSW will be leaving position next month and her next CCM Social Work follow up visit will be with another LCSW. Patient was appreciative of support provided and receptive to news . Patient was interested in Print production planner education. CCM LCSW provided education on local senior centers that offer daily activities and socialization such as [facility], [employer] and [employer] [facility]. CCM LCSW encouraged patient to contact [facility] to gain additional resource assistance and support.  . Patient reports that her anxiety increases with isolation. Patient was provided coping skill education on anxiety management. Patient was educated on healthy self-care tools to implement into her daily routine. Patient reports that she is able to swallow better now. Patient is trying to get out of the house more on her own. She recently gained socialization when she got out to meet a friend for a hot dog and was able to gain good conversation and activity.  . Provided mental health counseling with regard daily difficulty with depression/isolation/anxiety/mobility/weakness/balance. LCSW used active and reflective listening and implemented appropriate interventions to help suppport  patient and her emotional needs. Advised patient to implement deep breathing/grounding/meditation/self-care exercises into her daily routine to combat stressors. Education provided.  . Discussed plans with patient for ongoing care management follow up and provided patient with direct contact information for care management team . Advised patient to implement deep breathing exercises into her routine whenever anxiety/stress arise to help bring her back to the present state.  . Assisted patient/caregiver with obtaining information about health plan benefits . Provided education and assistance to client regarding Advanced Directives. . Provided education to patient/caregiver regarding level of care options. . Encouraged patient to consider [facility] (mental health provider) for long term follow up and therapy/counseling. PCP made a referral in the past. LCSW provided patient with their contact information and encouraged her to contact them but patient declined services as she reports that her anxiety and depression is being managed. She has been actively implementing healthy coping skills into her daily routine to combat her negative thinking. Positive reinforcement provided for this success. . Patient reports improvement with her bowels. . Patient reports that she has a strong social support network of family and friends. . Patient shares that her stomach issues trigger when anxiety arises. She continues to take stool softeners when needed. LCSW provided coping skill education for anxiety management. Patient reports that her anxiety is more tolerable now but admits that her depression can be easily triggered. Depression management and coping skill education provided to patient today. Patient was receptive to education provided. . Patient reports that she has been implementing more socialization into her routine which has been helpful. She reports that the cold weather does affect her mood  but she still makes it a priority to get out of the house often. Patient reports that she goes  to her favorite restaurant to meet her friends once or twice a month. She reports that she went to breaskfast this morning with her son. Also, patient goes out to eat with her spouse weekly on Wednesdays. LCSW encouraged patient to implement additional self-care and socialization activities in order to improve her mood as she often stays inside at home throughout the day. Patient reports that she will start to get out of the house more often to run errands and get vitamin D. Patient has several projects that she works on at home. She reports that she successfully made Christmas Cards for her family this year.  . Praise provided as patient has put in a lot of work by challenging her negative thinking.  Positive reinforcement provided. Patient has been implementing healthy self care into her daily routine to cope with anxiety and depression. Patient reports that she has been taking care of several plants in her home which she is very proud of. Patient shares that she is keeping up with all of her responsibilities such as paying bills on time, eating healthy, taking medications, washing clothes, etc. . Patient's son is her primary caregiver and he lives in the home. He has some developmental disabilities and has never lived on his own but is very helpful per patient. Patient shares that he works 2 1/2 days per week.  . Patient shares that she received an injection in her knee by Dr. Odis Luster and it has helped with her mobility. She will get these injections every 3-4 months when needed.  . Patient denies any urgent concerns and reports that her SOB has improved. . Patient reports having a successful hernia replacement surgery. Patient continues to keep a close eye on this recovery process.  . Patient confirms that she is taking her medication as prescribed, eating more protein since her surgery and drinking more fluids in  order to increase her overall self -care. Patient declines experiencing any recent nausea since medication was prescribed. Patient will take this medication when nausea symptoms arise.  . Patient reports that her cousin is a great support to her and she considers her weekly conversations with her as her very own therapy. Patient reports that she talks to her sister in law, cousin and son about her OCD as it gets triggered every year when it turns cold. Patient shares that her pain gets triggered by the cold as well. LCSW was educated on the "rubber band' method. She reports using this strategy in the past. When patient is experiencing obsessive thoughts she will pop her wrist to provide a distraction from that fixation. Coping skill education provided specifically for OCD. Marland Kitchen Patient shares that she continues to drive herself to all appointments. Patient had car worked on last week. Patient reports that she is thankful that she is still able to drive safely as getting out of the house is beneficial to her mood.  . Patient reports that her and her spouses' income has increased which is a great relief to her family as they were experiencing some recent financial strain. Patient's spouse continues to work.  . Patient reports that her anxiety has increased so she has been implementing the rubber band trick that CCM LCSW educated her on to help with her racing/negative thoughts that trigger from her OCD and anxiety. Patient reports that she is still going out of the house on her own to get socialization and run errands. She reports that she has been talking with several of her church friends  which has helped with her loneliness. Patient reports that her bowel movement has improved which has a relief for her. She states that she is eating well and drinking plenty of fluids.    Patient Self Care Activities:  . Attends all scheduled provider appointments . Lacks social connections  Please see past updates related  to this goal by clicking on the "Past Updates" button in the selected goal       Depression Screen PHQ 2/9 Scores 06/24/2020 09/13/2019 08/30/2019 06/19/2019 05/22/2019 05/04/2019 11/01/2018  PHQ - 2 Score 0 0 1 0 1 1 1   PHQ- 9 Score - 0 5 - 6 3 9     Fall Risk Fall Risk  06/24/2020 11/20/2019 11/09/2019 09/13/2019 07/04/2019  Falls in the past year? 0 0 0 0 0  Number falls in past yr: - 0 0 - 0  Injury with Fall? - 0 0 - 0  Risk for fall due to : Medication side effect - - No Fall Risks -  Follow up Falls evaluation completed;Education provided;Falls prevention discussed - - Falls evaluation completed -    FALL RISK PREVENTION PERTAINING TO THE HOME:  Any stairs in or around the home? Yes  If so, are there any without handrails? No  Home free of loose throw rugs in walkways, pet beds, electrical cords, etc? Yes  Adequate lighting in your home to reduce risk of falls? Yes   ASSISTIVE DEVICES UTILIZED TO PREVENT FALLS:  Life alert? No  Use of a cane, walker or w/c? No  Grab bars in the bathroom? No  Shower chair or bench in shower? No  Elevated toilet seat or a handicapped toilet? No   TIMED UP AND GO:  Was the test performed? No   Cognitive Function:     6CIT Screen 06/24/2020  What Year? 0 points  What month? 0 points  What time? 0 points  Count back from 20 0 points  Months in reverse 0 points  Repeat phrase 0 points  Total Score 0    Immunizations Immunization History  Administered Date(s) Administered  . Tdap 09/13/2015    TDAP status: Up to date  Flu Vaccine status: Declined, Education has been provided regarding the importance of this vaccine but patient still declined. Advised may receive this vaccine at local pharmacy or Health Dept. Aware to provide a copy of the vaccination record if obtained from local pharmacy or Health Dept. Verbalized acceptance and understanding.  Pneumococcal vaccine status: Declined,  Education has been provided regarding the importance of  this vaccine but patient still declined. Advised may receive this vaccine at local pharmacy or Health Dept. Aware to provide a copy of the vaccination record if obtained from local pharmacy or Health Dept. Verbalized acceptance and understanding.   Covid-19 vaccine status: Declined, Education has been provided regarding the importance of this vaccine but patient still declined. Advised may receive this vaccine at local pharmacy or Health Dept.or vaccine clinic. Aware to provide a copy of the vaccination record if obtained from local pharmacy or Health Dept. Verbalized acceptance and understanding.  Qualifies for Shingles Vaccine? Yes   Zostavax completed No   Shingrix Completed?: No.    Education has been provided regarding the importance of this vaccine. Patient has been advised to call insurance company to determine out of pocket expense if they have not yet received this vaccine. Advised may also receive vaccine at local pharmacy or Health Dept. Verbalized acceptance and understanding.  Screening Tests Health Maintenance  Topic  Date Due  . Hepatitis C Screening  Never done  . COVID-19 Vaccine (1) Never done  . COLONOSCOPY (Pts 45-289yrs Insurance coverage will need to be confirmed)  Never done  . MAMMOGRAM  Never done  . DEXA SCAN  Never done  . PNA vac Low Risk Adult (1 of 2 - PCV13) Never done  . INFLUENZA VACCINE  10/13/2020  . TETANUS/TDAP  09/12/2025  . HPV VACCINES  Aged Out    Health Maintenance  Health Maintenance Due  Topic Date Due  . Hepatitis C Screening  Never done  . COVID-19 Vaccine (1) Never done  . COLONOSCOPY (Pts 45-7489yrs Insurance coverage will need to be confirmed)  Never done  . MAMMOGRAM  Never done  . DEXA SCAN  Never done  . PNA vac Low Risk Adult (1 of 2 - PCV13) Never done    Colorectal cancer screening: decline  Mammogram status: decline  Bone Density status: decline  Lung Cancer Screening: (Low Dose CT Chest recommended if Age 37-80 years, 30  pack-year currently smoking OR have quit w/in 15years.) does not qualify.   Lung Cancer Screening Referral: no  Additional Screening:  Hepatitis C Screening: does qualify; due  Vision Screening: Recommended annual ophthalmology exams for early detection of glaucoma and other disorders of the eye. Is the patient up to date with their annual eye exam?  Yes  Who is the provider or what is the name of the office in which the patient attends annual eye exams? [facility] If pt is not established with a provider, would they like to be referred to a provider to establish care? No .   Dental Screening: Recommended annual dental exams for proper oral hygiene  Community Resource Referral / Chronic Care Management: CRR required this visit?  No   CCM required this visit?  No      Plan:     I have personally reviewed and noted the following in the patient's chart:   . Medical and social history . Use of alcohol, tobacco or illicit drugs  . Current medications and supplements . Functional ability and status . Nutritional status . Physical activity . Advanced directives . List of other physicians . Hospitalizations, surgeries, and ER visits in previous 12 months . Vitals . Screenings to include cognitive, depression, and falls . Referrals and appointments  In addition, I have reviewed and discussed with patient certain preventive protocols, quality metrics, and best practice recommendations. A written personalized care plan for preventive services as well as general preventive health recommendations were provided to patient.     Barb Merinoickeah E Harden Bramer, LPN   0/45/40984/02/2021   Nurse Notes:

## 2020-06-26 ENCOUNTER — Ambulatory Visit: Payer: Medicare HMO | Admitting: Physician Assistant

## 2020-06-26 ENCOUNTER — Other Ambulatory Visit: Payer: Self-pay | Admitting: Family Medicine

## 2020-06-26 DIAGNOSIS — F419 Anxiety disorder, unspecified: Secondary | ICD-10-CM

## 2020-06-26 NOTE — Telephone Encounter (Signed)
Requested medication (s) are due for refill today: yes  Requested medication (s) are on the active medication list: yes  Last refill:  03/29/20 #60 2 refills  Future visit scheduled: yes in 2 weeks  Notes to clinic:  not delegated per protocol     Requested Prescriptions  Pending Prescriptions Disp Refills   clonazePAM (KLONOPIN) 1 MG tablet [Pharmacy Med Name: CLONAZEPAM 1 MG TAB] 60 tablet     Sig: TAKE 1 TABLET BY MOUTH TWICE A DAY AS NEEDED FOR ANXIETY.      Not Delegated - Psychiatry:  Anxiolytics/Hypnotics Failed - 06/26/2020  3:47 PM      Failed - This refill cannot be delegated      Failed - Urine Drug Screen completed in last 360 days      Passed - Valid encounter within last 6 months    Recent Outpatient Visits           3 months ago Depression with anxiety   [facility], Jodelle Gross, FNP   6 months ago Essential hypertension   [facility], Jodelle Gross, FNP   7 months ago Hospital discharge follow-up   [facility], Jodelle Gross, FNP   8 months ago Constipation, unspecified constipation type   [facility], Jodelle Gross, FNP   9 months ago Essential hypertension   [facility], Jodelle Gross, FNP       Future Appointments             In 2 weeks Sampson Si, Salvadore Oxford, NP [facility], [facility]   In 1 year  [facility], [facility]

## 2020-06-27 ENCOUNTER — Other Ambulatory Visit: Payer: Self-pay

## 2020-07-08 ENCOUNTER — Ambulatory Visit: Payer: Medicare HMO | Admitting: Internal Medicine

## 2020-07-11 ENCOUNTER — Other Ambulatory Visit: Payer: Self-pay

## 2020-07-11 ENCOUNTER — Ambulatory Visit (INDEPENDENT_AMBULATORY_CARE_PROVIDER_SITE_OTHER): Payer: Medicare HMO | Admitting: Internal Medicine

## 2020-07-11 ENCOUNTER — Encounter: Payer: Self-pay | Admitting: Internal Medicine

## 2020-07-11 VITALS — BP 119/88 | HR 105 | Temp 98.0°F | Resp 18 | Ht 66.0 in | Wt 150.2 lb

## 2020-07-11 DIAGNOSIS — K219 Gastro-esophageal reflux disease without esophagitis: Secondary | ICD-10-CM

## 2020-07-11 DIAGNOSIS — F419 Anxiety disorder, unspecified: Secondary | ICD-10-CM

## 2020-07-11 DIAGNOSIS — I10 Essential (primary) hypertension: Secondary | ICD-10-CM | POA: Diagnosis not present

## 2020-07-11 DIAGNOSIS — F319 Bipolar disorder, unspecified: Secondary | ICD-10-CM

## 2020-07-11 DIAGNOSIS — M1990 Unspecified osteoarthritis, unspecified site: Secondary | ICD-10-CM

## 2020-07-11 DIAGNOSIS — F418 Other specified anxiety disorders: Secondary | ICD-10-CM

## 2020-07-11 DIAGNOSIS — K5900 Constipation, unspecified: Secondary | ICD-10-CM

## 2020-07-11 MED ORDER — OMEPRAZOLE 40 MG PO CPDR: 40.0000 mg | DELAYED_RELEASE_CAPSULE | Freq: Every morning | ORAL | 1 refills | Status: DC

## 2020-07-11 MED ORDER — PAROXETINE HCL 40 MG PO TABS: 40.0000 mg | ORAL_TABLET | Freq: Every day | ORAL | 1 refills | Status: DC

## 2020-07-11 NOTE — Assessment & Plan Note (Signed)
Controlled off meds  Will monitor 

## 2020-07-11 NOTE — Assessment & Plan Note (Signed)
Linzess d/c'd She will continue Miralax Encouraged high fiber diet and adequate water intake

## 2020-07-11 NOTE — Assessment & Plan Note (Signed)
Continue Omeprazole, wean not indicated given hx of stricture. Refilled today She will schedule a lab appt for CBC and CMET

## 2020-07-11 NOTE — Assessment & Plan Note (Signed)
Persistent, managed on Paroxetine and Clonazepam.  Paroxetine refilled today Will decrease Clonazepam to 0.5 mg BID Will obtain CSA and UDS at next visit

## 2020-07-11 NOTE — Assessment & Plan Note (Signed)
She will continue Tylenol OTC as needed She will call Dr. Maxcine Ham to schedule a knee injection if symptoms persist or worsen

## 2020-07-11 NOTE — Assessment & Plan Note (Signed)
Managed on Paroxetine and Clonazepam Paroxetine refilled today Will decrease Clonazepam to 0.5 mg BID prn Will obtain CSA and UDS at next visit

## 2020-07-11 NOTE — Progress Notes (Signed)
Subjective:    Patient ID: Katrina Sutton, female    DOB: 07/16/1950, 70 y.o.   MRN: [id number]  HPI  Pt presents to the clinic today for follow up of chronic conditions. She is establishing care with me today, transferring care from Danielle Rankin, NP.  HTN: Her BP today is 119/88. She is not taking any antihypertensive medications at this time. ECG from 10/2019 reviewed.  GERD: Triggered by stricture and hernia. She denies breakthrough on Omeprazole. There is no upper GI on file. She follows with GI.  Anxiety and Bipolar Depression: Chronic, managed on Paroxetine and Clonazepam. She is not currently seeing a therapist or psychiatrist. There is no CSA on file. She denies SI/HI.  OA: Mainly in her right knee. She takes Tylenol as needed with good relief of symptoms. She intermittently gets knee injections by Dr. Odis Luster.  Chronic Constipation: She does not take Linzess because it causes fecal urgency and incontinence. She takes Miralax daily. There is no colonoscopy on file.  Review of Systems      Past Medical History:  Diagnosis Date  . Allergy    seasonal  . Anxiety   . Arthritis    osteoarthritis  . Depression   . GERD (gastroesophageal reflux disease)   . Hiatal hernia     Current Outpatient Medications  Medication Sig Dispense Refill  . acetaminophen (TYLENOL) 500 MG tablet Take 500 mg by mouth every 8 (eight) hours as needed for headache.    . clonazePAM (KLONOPIN) 1 MG tablet TAKE 1 TABLET BY MOUTH TWICE A DAY AS NEEDED FOR ANXIETY. 60 tablet 0  . hydrocortisone cream 1 % Apply 1 application topically 2 (two) times daily as needed for itching.     . linaclotide (LINZESS) 145 MCG CAPS capsule Take 1 capsule (145 mcg total) by mouth daily before breakfast. 90 capsule 1  . omeprazole (PRILOSEC) 40 MG capsule Take 1 capsule (40 mg total) by mouth every morning. 90 capsule 1  . PARoxetine (PAXIL) 40 MG tablet Take 1 tablet (40 mg total) by mouth daily. 90 tablet 1   No  current facility-administered medications for this visit.    Allergies  Allergen Reactions  . Erythromycin Shortness Of Breath and Rash  . Duloxetine Palpitations    Family History  Problem Relation Age of Onset  . Mental illness Mother   . Ulcers Father   . Stroke Neg Hx   . Heart attack Neg Hx   . Cancer Neg Hx     Social History   Socioeconomic History  . Marital status: Married    Spouse name: Not on file  . Number of children: Not on file  . Years of education: Not on file  . Highest education level: Not on file  Occupational History  . Occupation: retired  Tobacco Use  . Smoking status: Former Smoker    Quit date: 12/27/1976    Years since quitting: 43.5  . Smokeless tobacco: Never Used  Vaping Use  . Vaping Use: Never used  Substance and Sexual Activity  . Alcohol use: No  . Drug use: No  . Sexual activity: Not Currently    Birth control/protection: None  Other Topics Concern  . Not on file  Social History Narrative  . Not on file   Social Determinants of Health   Financial Resource Strain: Low Risk   . Difficulty of Paying Living Expenses: Not hard at all  Food Insecurity: No Food Insecurity  . Worried About Running  Out of Food in the Last Year: Never true  . Ran Out of Food in the Last Year: Never true  Transportation Needs: No Transportation Needs  . Lack of Transportation (Medical): No  . Lack of Transportation (Non-Medical): No  Physical Activity: Inactive  . Days of Exercise per Week: 0 days  . Minutes of Exercise per Session: 0 min  Stress: Stress Concern Present  . Feeling of Stress : To some extent  Social Connections: Not on file  Intimate Partner Violence: Not on file     Constitutional: Denies fever, malaise, fatigue, headache or abrupt weight changes.  Respiratory: Denies difficulty breathing, shortness of breath, cough or sputum production.   Cardiovascular: Denies chest pain, chest tightness, palpitations or swelling in the  hands or feet.  Gastrointestinal: Pt reports difficulty swallowing, constipation. Denies abdominal pain, bloating, diarrhea or blood in the stool.  Musculoskeletal: Pt reports intermittent right knee pain. Denies decrease in range of motion, difficulty with gait, muscle pain or joint swelling.  Skin: Denies redness, rashes, lesions or ulcercations.  Neurological: Denies dizziness, difficulty with memory, difficulty with speech or problems with balance and coordination.  Psych: Pt has a history of anxiety and depression. Denies SI/HI.  No other specific complaints in a complete review of systems (except as listed in HPI above).  Objective:   Physical Exam   BP 119/88 (BP Location: Left Arm, Patient Position: Sitting, Cuff Size: Normal)   Pulse (!) 105   Temp 98 F (36.7 C) (Temporal)   Resp 18   Ht 5\' 6"  (1.676 m)   Wt 150 lb 3.2 oz (68.1 kg)   SpO2 96%   BMI 24.24 kg/m   Wt Readings from Last 3 Encounters:  06/24/20 150 lb (68 kg)  03/06/20 151 lb 3.2 oz (68.6 kg)  12/03/19 151 lb 6.4 oz (68.7 kg)    General: Appears her stated age, chronically ill appearing, in NAD. Skin: Warm, dry and intact.  HEENT: Head: normal shape and size; Eyes: sclera white and EOMs intact;  Neck:  Neck supple, trachea midline. No masses, lumps or thyromegaly present.  Cardiovascular: Tachycardic with normal rhythm. S1,S2 noted.  No murmur, rubs or gallops noted. No JVD or BLE edema. No carotid bruits noted. Pulmonary/Chest: Normal effort and positive vesicular breath sounds. No respiratory distress. No wheezes, rales or ronchi noted.  Abdomen: Soft and nontender. Hypoactive bowel sounds. No distention or masses noted. Musculoskeletal: Normal flexion and extension of the right knee. Pain with palpation noted over the medial joint line. Swelling noted of the medial right knee. Strength 5/5 BLE. No difficulty with gait.  Neurological: Alert and oriented.  Psychiatric: Mood and affect normal. Behavior is  normal. Judgment and thought content normal.    BMET    Component Value Date/Time   NA 142 11/01/2019 0431   NA 142 10/11/2012 2133   K 3.9 11/01/2019 0431   K 3.5 10/11/2012 2133   CL 106 11/01/2019 0431   CL 107 10/11/2012 2133   CO2 26 11/01/2019 0431   CO2 26 10/11/2012 2133   GLUCOSE 74 11/01/2019 0431   GLUCOSE 105 (H) 10/11/2012 2133   BUN 11 11/01/2019 0431   BUN 12 10/11/2012 2133   CREATININE 0.90 11/01/2019 0431   CREATININE 0.93 10/25/2019 1003   CALCIUM 9.1 11/01/2019 0431   CALCIUM 9.7 10/11/2012 2133   GFRNONAA >60 11/01/2019 0431   GFRNONAA 62 10/25/2019 1003   GFRAA >60 11/01/2019 0431   GFRAA 72 10/25/2019 1003  Lipid Panel     Component Value Date/Time   CHOL 167 10/25/2019 1003   TRIG 132 10/25/2019 1003   HDL 45 (L) 10/25/2019 1003   CHOLHDL 3.7 10/25/2019 1003   LDLCALC 99 10/25/2019 1003    CBC    Component Value Date/Time   WBC 12.0 (H) 11/01/2019 0431   RBC 4.61 11/01/2019 0431   HGB 14.3 11/01/2019 0431   HGB 14.9 10/11/2012 2133   HCT 41.1 11/01/2019 0431   HCT 41.7 10/11/2012 2133   PLT 295 11/01/2019 0431   PLT 249 10/11/2012 2133   MCV 89.2 11/01/2019 0431   MCV 87 10/11/2012 2133   MCH 31.0 11/01/2019 0431   MCHC 34.8 11/01/2019 0431   RDW 13.1 11/01/2019 0431   RDW 13.6 10/11/2012 2133   LYMPHSABS 1.4 10/29/2019 0548   LYMPHSABS 2.0 06/28/2011 1412   MONOABS 0.9 10/29/2019 0548   MONOABS 0.7 06/28/2011 1412   EOSABS 0.1 10/29/2019 0548   EOSABS 0.0 06/28/2011 1412   BASOSABS 0.0 10/29/2019 0548   BASOSABS 0.0 06/28/2011 1412    Hgb A1C No results found for: HGBA1C        Assessment & Plan:    Nicki Reaper, NP This visit occurred during the SARS-CoV-2 public health emergency.  Safety protocols were in place, including screening questions prior to the visit, additional usage of staff PPE, and extensive cleaning of exam room while observing appropriate contact time as indicated for disinfecting solutions.

## 2020-07-11 NOTE — Patient Instructions (Signed)
Constipation, Adult Constipation is when a person has trouble pooping (having a bowel movement). When you have this condition, you may poop fewer than 3 times a week. Your poop (stool) may also be dry, hard, or bigger than normal. Follow these instructions at home: Eating and drinking  Eat foods that have a lot of fiber, such as: ? Fresh fruits and vegetables. ? Whole grains. ? Beans.  Eat less of foods that are low in fiber and high in fat and sugar, such as: ? French fries. ? Hamburgers. ? Cookies. ? Candy. ? Soda.  Drink enough fluid to keep your pee (urine) pale yellow.   General instructions  Exercise regularly or as told by your doctor. Try to do 150 minutes of exercise each week.  Go to the restroom when you feel like you need to poop. Do not hold it in.  Take over-the-counter and prescription medicines only as told by your doctor. These include any fiber supplements.  When you poop: ? Do deep breathing while relaxing your lower belly (abdomen). ? Relax your pelvic floor. The pelvic floor is a group of muscles that support the rectum, bladder, and intestines (as well as the uterus in women).  Watch your condition for any changes. Tell your doctor if you notice any.  Keep all follow-up visits as told by your doctor. This is important. Contact a doctor if:  You have pain that gets worse.  You have a fever.  You have not pooped for 4 days.  You vomit.  You are not hungry.  You lose weight.  You are bleeding from the opening of the butt (anus).  You have thin, pencil-like poop. Get help right away if:  You have a fever, and your symptoms suddenly get worse.  You leak poop or have blood in your poop.  Your belly feels hard or bigger than normal (bloated).  You have very bad belly pain.  You feel dizzy or you faint. Summary  Constipation is when a person poops fewer than 3 times a week, has trouble pooping, or has poop that is dry, hard, or bigger than  normal.  Eat foods that have a lot of fiber.  Drink enough fluid to keep your pee (urine) pale yellow.  Take over-the-counter and prescription medicines only as told by your doctor. These include any fiber supplements. This information is not intended to replace advice given to you by your health care provider. Make sure you discuss any questions you have with your health care provider. Document Revised: 01/17/2019 Document Reviewed: 01/17/2019 Elsevier Patient Education  2021 Elsevier Inc.  

## 2020-07-15 ENCOUNTER — Other Ambulatory Visit: Payer: Medicare HMO

## 2020-07-15 ENCOUNTER — Other Ambulatory Visit: Payer: Self-pay

## 2020-07-15 DIAGNOSIS — K219 Gastro-esophageal reflux disease without esophagitis: Secondary | ICD-10-CM

## 2020-07-15 LAB — COMPREHENSIVE METABOLIC PANEL
AG Ratio: 2 (calc) (ref 1.0–2.5)
ALT: 7 U/L (ref 6–29)
AST: 15 U/L (ref 10–35)
Albumin: 4.1 g/dL (ref 3.6–5.1)
Alkaline phosphatase (APISO): 108 U/L (ref 37–153)
BUN/Creatinine Ratio: 10 (calc) (ref 6–22)
BUN: 10 mg/dL (ref 7–25)
CO2: 30 mmol/L (ref 20–32)
Calcium: 9.5 mg/dL (ref 8.6–10.4)
Chloride: 106 mmol/L (ref 98–110)
Creat: 0.97 mg/dL — ABNORMAL HIGH (ref 0.60–0.93)
Globulin: 2.1 g/dL (calc) (ref 1.9–3.7)
Glucose, Bld: 93 mg/dL (ref 65–99)
Potassium: 4.2 mmol/L (ref 3.5–5.3)
Sodium: 143 mmol/L (ref 135–146)
Total Bilirubin: 0.9 mg/dL (ref 0.2–1.2)
Total Protein: 6.2 g/dL (ref 6.1–8.1)

## 2020-07-15 LAB — CBC
HCT: 47.4 % — ABNORMAL HIGH (ref 35.0–45.0)
Hemoglobin: 15.3 g/dL (ref 11.7–15.5)
MCH: 29.7 pg (ref 27.0–33.0)
MCHC: 32.3 g/dL (ref 32.0–36.0)
MCV: 91.9 fL (ref 80.0–100.0)
MPV: 9.1 fL (ref 7.5–12.5)
Platelets: 267 10*3/uL (ref 140–400)
RBC: 5.16 10*6/uL — ABNORMAL HIGH (ref 3.80–5.10)
RDW: 13.3 % (ref 11.0–15.0)
WBC: 5.7 10*3/uL (ref 3.8–10.8)

## 2020-07-23 ENCOUNTER — Ambulatory Visit (INDEPENDENT_AMBULATORY_CARE_PROVIDER_SITE_OTHER): Payer: Medicare HMO | Admitting: Licensed Clinical Social Worker

## 2020-07-23 DIAGNOSIS — F319 Bipolar disorder, unspecified: Secondary | ICD-10-CM

## 2020-07-23 DIAGNOSIS — F418 Other specified anxiety disorders: Secondary | ICD-10-CM

## 2020-07-26 ENCOUNTER — Telehealth: Payer: Self-pay | Admitting: Family Medicine

## 2020-07-26 DIAGNOSIS — F419 Anxiety disorder, unspecified: Secondary | ICD-10-CM

## 2020-07-26 NOTE — Telephone Encounter (Signed)
Requested medication (s) are due for refill today: yes  Requested medication (s) are on the active medication list: yes  Last refill:  07/11/20  Future visit scheduled: yes  Notes to clinic:  med not delegated to NT to RF   Requested Prescriptions  Pending Prescriptions Disp Refills   clonazePAM (KLONOPIN) 1 MG tablet [Pharmacy Med Name: CLONAZEPAM 1 MG TAB] 60 tablet     Sig: TAKE 1 TABLET BY MOUTH TWICE A DAY AS NEEDED FOR ANXIETY      Not Delegated - Psychiatry:  Anxiolytics/Hypnotics Failed - 07/26/2020 10:13 AM      Failed - This refill cannot be delegated      Failed - Urine Drug Screen completed in last 360 days      Passed - Valid encounter within last 6 months    Recent Outpatient Visits           2 weeks ago Arthritis   [facility], Salvadore Oxford, NP   4 months ago Depression with anxiety   [facility], Jodelle Gross, FNP   7 months ago Essential hypertension   [facility], Jodelle Gross, FNP   8 months ago Hospital discharge follow-up   [facility], Jodelle Gross, FNP   9 months ago Constipation, unspecified constipation type   [facility], Jodelle Gross, FNP       Future Appointments             In 5 months Baity, Salvadore Oxford, NP [facility], [facility]   In 11 months  [facility], [facility]

## 2020-07-28 NOTE — Patient Instructions (Signed)
Visit Information  Goals Addressed              This Visit's Progress     Patient Stated   .  SW- I would like to gain additional support to manage my anxiety better (pt-stated)   On track     Patient Self Care Activities:  . Attend all scheduled provider appointments . Continue utilizing healthy coping skills and support system      Other   .  SW-Track and Manage My Symptoms-Depression   On track     Timeframe:  Long-Range Goal Priority:  Medium Start Date:  05/06/20                         Expected End Date:  11/12/20                     Follow Up Date - 09/24/20  Patient Self Care Activities:  . Attend all scheduled provider appointments . Continue utilizing healthy coping skills and support system       Patient verbalizes understanding of instructions provided today.   Telephone follow up appointment with care management team member scheduled for: 09/24/20 [name], MSW, LCSW [name] [facility] Management [facility]  [facility] [email or web address] Phone [phone] 7:03 AM

## 2020-07-28 NOTE — Chronic Care Management (AMB) (Signed)
Chronic Care Management    Clinical Social Work Note  07/28/2020 Name: Katrina Sutton MRN: [id number] DOB: 03-May-1950  Katrina Sutton is a 70 y.o. year old female who is a primary care patient of Lorre Munroe, NP. The CCM team was consulted to assist the patient with chronic disease management and/or care coordination needs related to: Mental Health Counseling and Resources.   Engaged with patient by telephone for follow up visit in response to provider referral for social work chronic care management and care coordination services.   Consent to Services:  The patient was given information about Chronic Care Management services, agreed to services, and gave verbal consent prior to initiation of services.  Please see initial visit note for detailed documentation.   Patient agreed to services and consent obtained.   Assessment: Patient is engaged in conversation, continues to maintain positive progress with care plan goals. Patient continues to utilize strong support system and healthy coping skills to assist in management of symptoms. See Care Plan below for interventions and patient self-care actives. Recent life changes [name]: Managing health conditions Recommendation: Patient may benefit from, and is in agreement to continued medication management and utilization of healthy coping skills.  Follow up Plan: Patient would like continued follow-up.  CCM LCSW will follow up with patient on 09/24/20. Patient will call office if needed prior to next encounter.    SDOH (Social Determinants of Health) assessments and interventions performed:    Advanced Directives Status: Not addressed in this encounter.  CCM Care Plan  Allergies  Allergen Reactions  . Erythromycin Shortness Of Breath and Rash  . Duloxetine Palpitations    Outpatient Encounter Medications as of 07/23/2020  Medication Sig Note  . acetaminophen (TYLENOL) 500 MG tablet Take 500 mg by mouth every 8 (eight) hours as needed  for headache.   . clonazePAM (KLONOPIN) 1 MG tablet Take 0.5 tablets (0.5 mg total) by mouth 2 (two) times daily as needed for anxiety.   . hydrocortisone cream 1 % Apply 1 application topically 2 (two) times daily as needed for itching.  06/19/2019: Very seldom   . omeprazole (PRILOSEC) 40 MG capsule Take 1 capsule (40 mg total) by mouth every morning.   Marland Kitchen PARoxetine (PAXIL) 40 MG tablet Take 1 tablet (40 mg total) by mouth daily.   . polyethylene glycol (MIRALAX / GLYCOLAX) 17 g packet Take 17 g by mouth daily.    No facility-administered encounter medications on file as of 07/23/2020.    Patient Active Problem List   Diagnosis Date Noted  . Hypertension 08/30/2019  . Constipation 04/17/2019  . Bipolar 1 disorder (HCC) 08/23/2017  . GERD (gastroesophageal reflux disease) 12/27/2016  . Arthritis 12/27/2016  . Depression with anxiety 12/27/2016    Conditions to be addressed/monitored: Anxiety, Depression and Bipolar Disorder; Mental Health Concerns   Care Plan : General Social Work (Adult)  Updates made by Bridgett Larsson, LCSW since 07/28/2020 12:00 AM    Problem: Coping Skills (General Plan of Care)     Long-Range Goal: Coping Skills Enhanced   Start Date: 05/06/2020  This Visit's Progress: On track  Recent Progress: On track  Priority: Medium  Note:   Timeframe:  Long-Range Goal Priority:  Medium Start Date:  05/06/20                         Expected End Date:  11/12/20  Follow Up Date - 09/24/20   Current Barriers:  . Limited social support . Mental Health Concerns  . Social Isolation . Limited access to caregiver . Lacks knowledge of community resource: available mental health resources that she can utilize   Clinical Social Work Clinical Goal(s):  Marland Kitchen Over the next 120 days, client will work with SW to address concerns related to decreasing anxious symptoms by implementing additional mental health support and coping skills into her routine.    Interventions: . Patient interviewed and appropriate assessments performed . Patient reports symptoms of depression and anxiety associated with Bipolar Disorder is stable at this time. . Patient states that her son is doing well and continues to be a strong support system for her. Patient talks frequently with her best friend and neighbors . Patient continues to read, pray, and complete devotions to cope with stressors. Patient is successfully completing her own errands and drinks a lot of water to remain healthy. She reports compliance with medication management . Discussed plans with patient for ongoing care management follow up and provided patient with direct contact information for care management team  Patient Self Care Activities:  . Attend all scheduled provider appointments . Continue utilizing healthy coping skills and support system        [name], MSW, LCSW [name] [facility] Management [place]  [facility] [email or web address] Phone [phone] 7:01 AM

## 2020-07-29 ENCOUNTER — Other Ambulatory Visit: Payer: Self-pay | Admitting: Internal Medicine

## 2020-07-29 DIAGNOSIS — F419 Anxiety disorder, unspecified: Secondary | ICD-10-CM

## 2020-07-29 NOTE — Addendum Note (Signed)
Addended by: Lonna Cobb on: 07/29/2020 04:44 PM   Modules accepted: Orders

## 2020-07-29 NOTE — Telephone Encounter (Signed)
Patient called in to inform Dr Sampson Si that she need an Rx sent to her pharmacy for Clonazepam please advise

## 2020-07-29 NOTE — Telephone Encounter (Signed)
  Requested medication (s) are on the active medication list:yes  Last refill:  06/27/2020  Future visit scheduled:yes  Notes to clinic:  this refill cannot be delegated     Requested Prescriptions  Pending Prescriptions Disp Refills   clonazePAM (KLONOPIN) 1 MG tablet [Pharmacy Med Name: CLONAZEPAM 1 MG TAB] 60 tablet     Sig: TAKE 1 TABLET BY MOUTH TWICE A DAY AS NEEDED FOR ANXIETY      Not Delegated - Psychiatry:  Anxiolytics/Hypnotics Failed - 07/29/2020  9:54 AM      Failed - This refill cannot be delegated      Failed - Urine Drug Screen completed in last 360 days      Passed - Valid encounter within last 6 months    Recent Outpatient Visits           2 weeks ago Arthritis   [facility] [place], Salvadore Oxford, NP   4 months ago Depression with anxiety   [facility], Jodelle Gross, FNP   7 months ago Essential hypertension   [facility], Jodelle Gross, FNP   8 months ago Hospital discharge follow-up   [facility], Jodelle Gross, FNP   9 months ago Constipation, unspecified constipation type   [facility], Jodelle Gross, FNP       Future Appointments             In 5 months Baity, Salvadore Oxford, NP [facility]   In 11 months  [facility], [facility]

## 2020-07-29 NOTE — Telephone Encounter (Signed)
Would she be willing to try 1/2 tab 3 x day?

## 2020-07-29 NOTE — Telephone Encounter (Signed)
I called the patient to clarify how she was taking the Clonazepam. She informed me that she attempted several times to take a 1/2 tablet BID, but after a day of doing that she become extremely nervous, jittery, confuse and even feel like her lips are becoming numb. Please advise

## 2020-07-29 NOTE — Telephone Encounter (Signed)
There are multiple requests for this same medication. She should be taking1 /2 tab BID not 1 tab BID. Too early for refill.

## 2020-07-29 NOTE — Telephone Encounter (Signed)
She is supposed to be taking 1/2 tab BID, not 1 tab BID. She should not be out. This was discussed at the OV.

## 2020-07-30 MED ORDER — CLONAZEPAM 1 MG PO TABS: 0.5000 mg | ORAL_TABLET | Freq: Three times a day (TID) | ORAL | 0 refills | Status: DC | PRN

## 2020-07-30 NOTE — Telephone Encounter (Signed)
Updated RX sent to pharmacy Nicki Reaper, NP

## 2020-07-30 NOTE — Addendum Note (Signed)
Addended by: Lorre Munroe on: 07/30/2020 07:46 AM   Modules accepted: Orders

## 2020-08-04 ENCOUNTER — Telehealth: Payer: Self-pay | Admitting: General Practice

## 2020-08-04 ENCOUNTER — Telehealth: Payer: Self-pay

## 2020-08-04 ENCOUNTER — Ambulatory Visit: Payer: Self-pay | Admitting: General Practice

## 2020-08-04 DIAGNOSIS — I10 Essential (primary) hypertension: Secondary | ICD-10-CM

## 2020-08-04 DIAGNOSIS — F419 Anxiety disorder, unspecified: Secondary | ICD-10-CM

## 2020-08-04 DIAGNOSIS — F418 Other specified anxiety disorders: Secondary | ICD-10-CM

## 2020-08-04 DIAGNOSIS — F319 Bipolar disorder, unspecified: Secondary | ICD-10-CM

## 2020-08-04 NOTE — Patient Instructions (Signed)
Visit Information  PATIENT GOALS: Patient Care Plan: RNCM: Management of Depression/Anxiety/Bipolar Disorder (Adult)    Problem Identified: RNCM: Management of Chronic Conditions: Anxiety, Depression, Bipolar disorder   Priority: Medium    Goal: RNCM: Effective Management of Anxiety, Depression, and Bipolar   Start Date: 03/10/2020  Expected End Date: 07/08/2020  Priority: Medium  Note:   Current Barriers:  Marland Kitchen Knowledge Deficits related to management of depression, anxiety, and bipolar disorder . Care Coordination needs related to ongoing support and needs in a patient with behavioral health concerns (depression, anxiety, bipolar) . Chronic Disease Management support and education needs related to effective management of depression, anxiety, and bipolar disorder . Lacks caregiver support.  . Unable to independently manage anxiety, depression, or bipolar disorder . Lacks social connections . Does not contact provider office for questions/concerns  Nurse Case Manager Clinical Goal(s):  Marland Kitchen Over the next 120 days, patient will verbalize understanding of plan for management of anxiety, depression, and bipolar disorder . Over the next 120 days, patient will work with RNCM, pcp , and CCM team to address needs related to exacerbations in anxiety, depression, and bipolar disorder . Over the next 120 days, patient will demonstrate a decrease in anxiety, depression, and bipolar disorder exacerbations as evidenced by stabilized mood and seeing providers as scheduled  . Over the next 120 days, patient will attend all scheduled medical appointments: has follow up with the CCM team. Will schedule and appointment with the pcp as needed.  . Over the next 120 days, patient will work with CM clinical social worker to currently working with the CCM team LCSW . Over the next 120 days, the patient will demonstrate ongoing self health care management ability as evidenced by effective management of anxiety,  depression and bipolar disorder  Interventions:  . 1:1 collaboration with Jearld Fenton, NP regarding development and update of comprehensive plan of care as evidenced by provider attestation and co-signature . Inter-disciplinary care team collaboration (see longitudinal plan of care) . Evaluation of current treatment plan related to depression, anxiety, and bipolar disorder and patient's adherence to plan as established by provider. 06-09-2020: The patient states that she has anxiety about life in general but she is doing okay and managing things well. Her husband has been out of town sometimes and she has "separation anxiety" but her son is there and helps her and she is managing and doing well. Denies any acute distress. 08-04-2020: The patient is a little "jittery" with going down on her medications. The patient states that she is adjusting the best that she can right now.  She will let the pcp know if this is not going to work for her. She has been on a higher dose for a long time. She is walking in her garden and prays a lot. She denies any acute distress.  . Advised patient to call the provider for changes in condition or worsening sx/sx of depression, anxiety, and bipolar disorder . Provided education to patient re: maintaining health and well being, keeping balance in daily routine, practicing mindfulness as needed, taking medication as prescribed, and utilization of support system when she gets down or anxious . Reviewed medications with patient and discussed compliance, the patient endorses compliance with medications.  06-09-2020: Is concerned about refills, she will need new refills in the near future. Discuss getting an appointment with new pcp. Will in basket the staff and get an appointment for the patient. 08-04-2020: The patient states that she has  her medications  and she is adjusting to the changes.  . Discussed plans with patient for ongoing care management follow up and provided patient  with direct contact information for care management team . Provided patient with mindfulness educational materials related to helping when the patient gets stressed out or overwhelmed  . - anxiety screen reviewed . - depression screen reviewed  Patient Goals/Self-Care Activities Over the next 120 days, patient will:  - Patient will self administer medications as prescribed Patient will attend all scheduled provider appointments Patient will call provider office for new concerns or questions Patient will work with BSW to address care coordination needs and will continue to work with the clinical team to address health care and disease management related needs.    Follow Up Plan: Telephone follow up appointment with care management team member scheduled for: 10-13-2020 at 53 am       Task: RNCM: Identify Depressive Symptoms and Facilitate Treatment   Note:   Care Management Activities:    - anxiety screen reviewed - depression screen reviewed       Patient Care Plan: RNCM: Care Plan for Hypertension (Adult)    Problem Identified: RNCM: Hypertension (Hypertension)   Priority: Medium    Long-Range Goal: RNCM: HTN monitored and managed   Start Date: 03/10/2020  Priority: Medium  Note:   Objective:  . Last practice recorded BP readings:  . BP Readings from Last 3 Encounters: .  07/11/20 . 119/88 .  03/06/20 . 123/75 .  12/03/19 . 129/76 .    Marland Kitchen Most recent eGFR/CrCl: No results found for: EGFR  No components found for: CRCL Current Barriers:  Marland Kitchen Knowledge Deficits related to basic understanding of hypertension pathophysiology and self care management . Limited Social Support . Unable to independently manage HTN  . Does not contact provider office for questions/concerns Case Manager Clinical Goal(s):  Marland Kitchen Over the next 120 days, patient will verbalize understanding of plan for hypertension management . Over the next 120 days, patient will demonstrate improved adherence to  prescribed treatment plan for hypertension as evidenced by taking all medications as prescribed, monitoring and recording blood pressure as directed, adhering to low sodium/DASH diet . Over the next 120 days, patient will demonstrate improved health management independence as evidenced by checking blood pressure as directed and notifying PCP if SBP>150 or DBP > 90, taking all medications as prescribe, and adhering to a low sodium diet as discussed. . Over the next 120 days, patient will verbalize basic understanding of hypertension disease process and self health management plan as evidenced by following recommended diet, normalized blood pressures, and routine visits to the pcp Interventions:  Marland Kitchen UNABLE to independently:manage HTN . Evaluation of current treatment plan related to hypertension self management and patient's adherence to plan as established by provider. . Provided education to patient re: stroke prevention, s/s of heart attack and stroke, DASH diet, complications of uncontrolled blood pressure . Reviewed medications with patient and discussed importance of compliance. 08-04-2020: The patient endorses taking medications as ordered.  . Discussed plans with patient for ongoing care management follow up and provided patient with direct contact information for care management team . Advised patient, providing education and rationale, to monitor blood pressure daily and record, calling PCP for findings outside established parameters. The patient states her blood pressures have been good. States systolic she is 546 to 568 and diastolic she is 70 to 80. . - anxiety screen reviewed . - depression screen reviewed Patient Goals/Self-Care Activities . Over the  next 120 days, patient will:  - UNABLE to independently manage HTN Calls provider office for new concerns, questions, or BP outside discussed parameters Follows a low sodium diet/DASH diet Follow Up Plan: Telephone follow up appointment with  care management team member scheduled for: 10-13-2020 at 1030  am   Task: RNCM: Identify and Monitor Blood Pressure Elevation   Note:   Care Management Activities:    - blood pressure trends reviewed - depression screen reviewed - home or ambulatory blood pressure monitoring encouraged      Patient Care Plan: General Social Work (Adult)    Problem Identified: Coping Skills (General Plan of Care)     Long-Range Goal: Coping Skills Enhanced   Start Date: 05/06/2020  This Visit's Progress: On track  Recent Progress: On track  Priority: Medium  Note:   Timeframe:  Long-Range Goal Priority:  Medium Start Date:  05/06/20                         Expected End Date:  11/12/20                     Follow Up Date - 09/24/20   Current Barriers:  . Limited social support . Mental Health Concerns  . Social Isolation . Limited access to caregiver . Lacks knowledge of community resource: available mental health resources that she can utilize   Clinical Social Work Clinical Goal(s):  Marland Kitchen Over the next 120 days, client will work with SW to address concerns related to decreasing anxious symptoms by implementing additional mental health support and coping skills into her routine.   Interventions: . Patient interviewed and appropriate assessments performed . Patient reports symptoms of depression and anxiety associated with Bipolar Disorder is stable at this time. . Patient states that her son is doing well and continues to be a strong support system for her. Patient talks frequently with her best friend and neighbors . Patient continues to read, pray, and complete devotions to cope with stressors. Patient is successfully completing her own errands and drinks a lot of water to remain healthy. She reports compliance with medication management . Discussed plans with patient for ongoing care management follow up and provided patient with direct contact information for care management team  Patient Self  Care Activities:  . Attend all scheduled provider appointments . Continue utilizing healthy coping skills and support system        The patient verbalized understanding of instructions, educational materials, and care plan provided today and declined offer to receive copy of patient instructions, educational materials, and care plan.   Telephone follow up appointment with care management team member scheduled for: 10-13-2020 at 36 am  Noreene Larsson RN, MSN, [place] Mobile: [phone]

## 2020-08-04 NOTE — Chronic Care Management (AMB) (Signed)
Chronic Care Management   CCM RN Visit Note  08/04/2020 Name: Katrina Sutton MRN: [id number] DOB: 1951-03-07  Subjective: Katrina Sutton is a 70 y.o. year old female who is a primary care patient of Jearld Fenton, NP. The care management team was consulted for assistance with disease management and care coordination needs.    Engaged with patient by telephone for follow up visit in response to provider referral for case management and/or care coordination services.   Consent to Services:  The patient was given information about Chronic Care Management services, agreed to services, and gave verbal consent prior to initiation of services.  Please see initial visit note for detailed documentation.   Patient agreed to services and verbal consent obtained.   Assessment: Review of patient past medical history, allergies, medications, health status, including review of consultants reports, laboratory and other test data, was performed as part of comprehensive evaluation and provision of chronic care management services.   SDOH (Social Determinants of Health) assessments and interventions performed:    CCM Care Plan  Allergies  Allergen Reactions  . Erythromycin Shortness Of Breath and Rash  . Duloxetine Palpitations    Outpatient Encounter Medications as of 08/04/2020  Medication Sig Note  . acetaminophen (TYLENOL) 500 MG tablet Take 500 mg by mouth every 8 (eight) hours as needed for headache.   . clonazePAM (KLONOPIN) 1 MG tablet Take 0.5 tablets (0.5 mg total) by mouth 3 (three) times daily as needed for anxiety.   . hydrocortisone cream 1 % Apply 1 application topically 2 (two) times daily as needed for itching.  06/19/2019: Very seldom   . omeprazole (PRILOSEC) 40 MG capsule Take 1 capsule (40 mg total) by mouth every morning.   Marland Kitchen PARoxetine (PAXIL) 40 MG tablet Take 1 tablet (40 mg total) by mouth daily.   . polyethylene glycol (MIRALAX / GLYCOLAX) 17 g packet Take 17 g by mouth daily.     No facility-administered encounter medications on file as of 08/04/2020.    Patient Active Problem List   Diagnosis Date Noted  . Hypertension 08/30/2019  . Constipation 04/17/2019  . Bipolar 1 disorder ([place]) 08/23/2017  . GERD (gastroesophageal reflux disease) 12/27/2016  . Arthritis 12/27/2016  . Depression with anxiety 12/27/2016    Conditions to be addressed/monitored:HTN, Anxiety, Depression and Bipolar Disorder  Care Plan : RNCM: Management of Depression/Anxiety/Bipolar Disorder (Adult)  Updates made by Vanita Ingles since 08/04/2020 12:00 AM    Problem: RNCM: Management of Chronic Conditions: Anxiety, Depression, Bipolar disorder   Priority: Medium    Goal: RNCM: Effective Management of Anxiety, Depression, and Bipolar   Start Date: 03/10/2020  Expected End Date: 07/08/2020  Priority: Medium  Note:   Current Barriers:  Marland Kitchen Knowledge Deficits related to management of depression, anxiety, and bipolar disorder . Care Coordination needs related to ongoing support and needs in a patient with behavioral health concerns (depression, anxiety, bipolar) . Chronic Disease Management support and education needs related to effective management of depression, anxiety, and bipolar disorder . Lacks caregiver support.  . Unable to independently manage anxiety, depression, or bipolar disorder . Lacks social connections . Does not contact provider office for questions/concerns  Nurse Case Manager Clinical Goal(s):  Marland Kitchen Over the next 120 days, patient will verbalize understanding of plan for management of anxiety, depression, and bipolar disorder . Over the next 120 days, patient will work with RNCM, pcp , and CCM team to address needs related to exacerbations in anxiety, depression, and  bipolar disorder . Over the next 120 days, patient will demonstrate a decrease in anxiety, depression, and bipolar disorder exacerbations as evidenced by stabilized mood and seeing providers as scheduled   . Over the next 120 days, patient will attend all scheduled medical appointments: has follow up with the CCM team. Will schedule and appointment with the pcp as needed.  . Over the next 120 days, patient will work with CM clinical social worker to currently working with the CCM team LCSW . Over the next 120 days, the patient will demonstrate ongoing self health care management ability as evidenced by effective management of anxiety, depression and bipolar disorder  Interventions:  . 1:1 collaboration with Jearld Fenton, NP regarding development and update of comprehensive plan of care as evidenced by provider attestation and co-signature . Inter-disciplinary care team collaboration (see longitudinal plan of care) . Evaluation of current treatment plan related to depression, anxiety, and bipolar disorder and patient's adherence to plan as established by provider. 06-09-2020: The patient states that she has anxiety about life in general but she is doing okay and managing things well. Her husband has been out of town sometimes and she has "separation anxiety" but her son is there and helps her and she is managing and doing well. Denies any acute distress. 08-04-2020: The patient is a little "jittery" with going down on her medications. The patient states that she is adjusting the best that she can right now.  She will let the pcp know if this is not going to work for her. She has been on a higher dose for a long time. She is walking in her garden and prays a lot. She denies any acute distress.  . Advised patient to call the provider for changes in condition or worsening sx/sx of depression, anxiety, and bipolar disorder . Provided education to patient re: maintaining health and well being, keeping balance in daily routine, practicing mindfulness as needed, taking medication as prescribed, and utilization of support system when she gets down or anxious . Reviewed medications with patient and discussed  compliance, the patient endorses compliance with medications.  06-09-2020: Is concerned about refills, she will need new refills in the near future. Discuss getting an appointment with new pcp. Will in basket the staff and get an appointment for the patient. 08-04-2020: The patient states that she has  her medications and she is adjusting to the changes.  . Discussed plans with patient for ongoing care management follow up and provided patient with direct contact information for care management team . Provided patient with mindfulness educational materials related to helping when the patient gets stressed out or overwhelmed  . - anxiety screen reviewed . - depression screen reviewed  Patient Goals/Self-Care Activities Over the next 120 days, patient will:  - Patient will self administer medications as prescribed Patient will attend all scheduled provider appointments Patient will call provider office for new concerns or questions Patient will work with BSW to address care coordination needs and will continue to work with the clinical team to address health care and disease management related needs.    Follow Up Plan: Telephone follow up appointment with care management team member scheduled for: 10-13-2020 at 12 am       Care Plan : RNCM: Care Plan for Hypertension (Adult)  Updates made by Vanita Ingles since 08/04/2020 12:00 AM    Problem: RNCM: Hypertension (Hypertension)   Priority: Medium    Long-Range Goal: RNCM: HTN monitored and managed  Start Date: 03/10/2020  Priority: Medium  Note:   Objective:  . Last practice recorded BP readings:  . BP Readings from Last 3 Encounters: .  07/11/20 . 119/88 .  03/06/20 . 123/75 .  12/03/19 . 129/76 .    Marland Kitchen Most recent eGFR/CrCl: No results found for: EGFR  No components found for: CRCL Current Barriers:  Marland Kitchen Knowledge Deficits related to basic understanding of hypertension pathophysiology and self care management . Limited Social  Support . Unable to independently manage HTN  . Does not contact provider office for questions/concerns Case Manager Clinical Goal(s):  Marland Kitchen Over the next 120 days, patient will verbalize understanding of plan for hypertension management . Over the next 120 days, patient will demonstrate improved adherence to prescribed treatment plan for hypertension as evidenced by taking all medications as prescribed, monitoring and recording blood pressure as directed, adhering to low sodium/DASH diet . Over the next 120 days, patient will demonstrate improved health management independence as evidenced by checking blood pressure as directed and notifying PCP if SBP>150 or DBP > 90, taking all medications as prescribe, and adhering to a low sodium diet as discussed. . Over the next 120 days, patient will verbalize basic understanding of hypertension disease process and self health management plan as evidenced by following recommended diet, normalized blood pressures, and routine visits to the pcp Interventions:  Marland Kitchen UNABLE to independently:manage HTN . Evaluation of current treatment plan related to hypertension self management and patient's adherence to plan as established by provider. . Provided education to patient re: stroke prevention, s/s of heart attack and stroke, DASH diet, complications of uncontrolled blood pressure . Reviewed medications with patient and discussed importance of compliance. 08-04-2020: The patient endorses taking medications as ordered.  . Discussed plans with patient for ongoing care management follow up and provided patient with direct contact information for care management team . Advised patient, providing education and rationale, to monitor blood pressure daily and record, calling PCP for findings outside established parameters. The patient states her blood pressures have been good. States systolic she is 947 to 076 and diastolic she is 70 to 80. . - anxiety screen reviewed . -  depression screen reviewed Patient Goals/Self-Care Activities . Over the next 120 days, patient will:  - UNABLE to independently manage HTN Calls provider office for new concerns, questions, or BP outside discussed parameters Follows a low sodium diet/DASH diet Follow Up Plan: Telephone follow up appointment with care management team member scheduled for: 10-13-2020 at 34  am     Plan:Telephone follow up appointment with care management team member scheduled for:  10-13-2020 at 46 am  [place], MSN, [place] Mobile: [phone]

## 2020-08-04 NOTE — Telephone Encounter (Signed)
  Care Management   Follow Up Note   08/04/2020 Name: Katrina Sutton MRN: [id number] DOB: 1950/04/13   Referred by: Lorre Munroe, NP Reason for referral : Chronic Care Management (RN CM phone call for follow up for chronic disease management ans care coordination needs)   An unsuccessful telephone outreach was attempted today. The patient was referred to the case management team for assistance with care management and care coordination.   Follow Up Plan: A HIPPA compliant phone message was left for the patient providing contact information and requesting a return call.    Virgina Norfolk RN, [facility] Health  [facility] [employer] Mobile: [phone]

## 2020-08-07 ENCOUNTER — Ambulatory Visit: Payer: Medicare HMO | Admitting: Podiatry

## 2020-08-19 ENCOUNTER — Ambulatory Visit: Payer: Medicare HMO | Admitting: Podiatry

## 2020-08-22 ENCOUNTER — Other Ambulatory Visit: Payer: Self-pay

## 2020-08-22 NOTE — Telephone Encounter (Signed)
Copied from CRM [id number]. Topic: General - Call Back - No Documentation >> Aug 22, 2020 12:26 PM Randol Kern wrote: Reason for CRM: [phone] Pt wants to speak to the office regarding medication listed below, she has questions.   clonazePAM (KLONOPIN) 1 MG tablet >> Aug 22, 2020  2:40 PM Laural Benes, [place] C wrote: Pt called in to follow up on request for call back. Pt says that she has a hard time breaking her medication in half and feels that she is not getting the full dose, pt says that she feels like her anxiety is up.   Pt would like to discuss further.

## 2020-08-23 MED ORDER — CLONAZEPAM 0.5 MG PO TABS: 0.5000 mg | ORAL_TABLET | Freq: Three times a day (TID) | ORAL | 0 refills | Status: DC

## 2020-08-23 NOTE — Telephone Encounter (Signed)
I can do this but she will need to wait another week. She is not yet due for a refill

## 2020-08-26 ENCOUNTER — Other Ambulatory Visit: Payer: Self-pay

## 2020-08-26 DIAGNOSIS — I499 Cardiac arrhythmia, unspecified: Secondary | ICD-10-CM

## 2020-09-09 ENCOUNTER — Ambulatory Visit: Payer: Medicare HMO | Admitting: Podiatry

## 2020-09-22 ENCOUNTER — Other Ambulatory Visit: Payer: Self-pay | Admitting: Internal Medicine

## 2020-09-22 NOTE — Telephone Encounter (Signed)
Requested medication (s) are due for refill today: yes  Requested medication (s) are on the active medication list: yes  Last refill:  08/23/20 #90 0 refill  Future visit scheduled: yes in 3 months  Notes to clinic:  not delegated per protocol     Requested Prescriptions  Pending Prescriptions Disp Refills   clonazePAM (KLONOPIN) 0.5 MG tablet [Pharmacy Med Name: CLONAZEPAM 0.5 MG TAB] 90 tablet     Sig: TAKE 1 TABLET BY MOUTH 3 TIMES DAILY      Not Delegated - Psychiatry:  Anxiolytics/Hypnotics Failed - 09/22/2020  9:33 AM      Failed - This refill cannot be delegated      Failed - Urine Drug Screen completed in last 360 days      Passed - Valid encounter within last 6 months    Recent Outpatient Visits           2 months ago Arthritis   [facility] [place], Salvadore Oxford, NP   6 months ago Depression with anxiety   [facility], Jodelle Gross, FNP   9 months ago Essential hypertension   [facility], Jodelle Gross, FNP   10 months ago Hospital discharge follow-up   [facility], Jodelle Gross, FNP   11 months ago Constipation, unspecified constipation type   [facility], Jodelle Gross, FNP       Future Appointments             In 3 months Baity, Salvadore Oxford, NP [facility]   In 9 months  [facility], [facility]

## 2020-09-24 ENCOUNTER — Ambulatory Visit (INDEPENDENT_AMBULATORY_CARE_PROVIDER_SITE_OTHER): Payer: Medicare HMO | Admitting: Licensed Clinical Social Worker

## 2020-09-24 DIAGNOSIS — I10 Essential (primary) hypertension: Secondary | ICD-10-CM

## 2020-09-24 DIAGNOSIS — F418 Other specified anxiety disorders: Secondary | ICD-10-CM

## 2020-09-24 DIAGNOSIS — F319 Bipolar disorder, unspecified: Secondary | ICD-10-CM

## 2020-09-25 ENCOUNTER — Ambulatory Visit: Payer: Medicare HMO | Admitting: General Surgery

## 2020-09-26 NOTE — Patient Instructions (Signed)
Visit Information   Goals Addressed               This Visit's Progress     Patient Stated     SW- I would like to gain additional support to manage my anxiety better (pt-stated)   On track     Patient Self Care Activities:  Attend all scheduled provider appointments Continue utilizing healthy coping skills and support system Contact clinic with any questions or concerns      Other     SW-Track and Manage My Symptoms-Depression   On track     Timeframe:  Long-Range Goal Priority:  Medium Start Date:  05/06/20                         Expected End Date:  01/12/21                     Follow Up Date - 11/25/20  Patient Self Care Activities:  Attend all scheduled provider appointments Continue utilizing healthy coping skills and support system Contact clinic with any questions or concerns        Patient verbalizes understanding of instructions provided today and agrees to view in MyChart.   Telephone follow up appointment with care management team member scheduled for:11/25/20  [name], MSW, LCSW [name] Management [facility]  [email or web address] Phone [phone] 5:15 PM

## 2020-09-26 NOTE — Chronic Care Management (AMB) (Signed)
Chronic Care Management    Clinical Social Work Note  09/26/2020 Name: Katrina Sutton MRN: [id number] DOB: 08/29/1950  Katrina Sutton is a 70 y.o. year old female who is a primary care patient of Lorre Munroe, NP. The CCM team was consulted to assist the patient with chronic disease management and/or care coordination needs related to: Mental Health Counseling and Resources.   Engaged with patient by telephone for follow up visit in response to provider referral for social work chronic care management and care coordination services.   Consent to Services:  The patient was given information about Chronic Care Management services, agreed to services, and gave verbal consent prior to initiation of services.  Please see initial visit note for detailed documentation.   Patient agreed to services and consent obtained.   Assessment: Patient is engaged in conversation, continues to maintain positive progress with care plan goals. Patient reports management of symptoms and continues to utilize healthy coping skills. See Care Plan below for interventions and patient self-care actives. Recommendation: Patient may benefit from, and is in agreement to work with LCSW to address care coordination needs and will continue to work with the clinical team to address health care and disease management related needs.  Follow up Plan: Patient would like continued follow-up from CCM LCSW .  Follow up scheduled in 11/25/20. Patient will call office if needed prior to next encounter.  SDOH (Social Determinants of Health) assessments and interventions performed:  SDOH Interventions    Flowsheet Row Most Recent Value  SDOH Interventions   Food Insecurity Interventions Intervention Not Indicated  Housing Interventions Intervention Not Indicated  Transportation Interventions Intervention Not Indicated        Advanced Directives Status: Not addressed in this encounter.  CCM Care Plan  Allergies  Allergen  Reactions   Erythromycin Shortness Of Breath and Rash   Duloxetine Palpitations    Outpatient Encounter Medications as of 09/24/2020  Medication Sig Note   acetaminophen (TYLENOL) 500 MG tablet Take 500 mg by mouth every 8 (eight) hours as needed for headache.    clonazePAM (KLONOPIN) 0.5 MG tablet TAKE 1 TABLET BY MOUTH 3 TIMES DAILY    clonazePAM (KLONOPIN) 1 MG tablet Take 0.5 tablets (0.5 mg total) by mouth 3 (three) times daily as needed for anxiety.    hydrocortisone cream 1 % Apply 1 application topically 2 (two) times daily as needed for itching.  06/19/2019: Very seldom    omeprazole (PRILOSEC) 40 MG capsule Take 1 capsule (40 mg total) by mouth every morning.    PARoxetine (PAXIL) 40 MG tablet Take 1 tablet (40 mg total) by mouth daily.    polyethylene glycol (MIRALAX / GLYCOLAX) 17 g packet Take 17 g by mouth daily.    No facility-administered encounter medications on file as of 09/24/2020.    Patient Active Problem List   Diagnosis Date Noted   Hypertension 08/30/2019   Constipation 04/17/2019   Bipolar 1 disorder (HCC) 08/23/2017   GERD (gastroesophageal reflux disease) 12/27/2016   Arthritis 12/27/2016   Depression with anxiety 12/27/2016    Conditions to be addressed/monitored: Anxiety, Depression, and Bipolar Disorder; Mental Health Concerns   Care Plan : General Social Work (Adult)  Updates made by Bridgett Larsson, LCSW since 09/26/2020 12:00 AM     Problem: Coping Skills (General Plan of Care)      Long-Range Goal: Coping Skills Enhanced   Start Date: 05/06/2020  This Visit's Progress: On track  Recent Progress: On track  Priority: Medium  Note:   Timeframe:  Long-Range Goal Priority:  Medium Start Date:  05/06/20                         Expected End Date:  01/12/21                     Follow Up Date - 11/25/20   Current Barriers:  Limited social support Mental Health Concerns  Social Isolation Limited access to caregiver Lacks knowledge of community  resource: available mental health resources that she can utilize   Clinical Social Work Clinical Goal(s):  Over the next 120 days, client will work with SW to address concerns related to decreasing anxious symptoms by implementing additional mental health support and coping skills into her routine.   Interventions: Patient interviewed and appropriate assessments performed Assessed patient's previous and current treatment, coping skills, support system and barriers to care Patient reports symptoms of depression and anxiety associated with Bipolar Disorder had increased for a short while. Patient contributes this to medication adjustments by PCP Patient shared that she is feeling better. She denies current stressors Patient continues to pray and complete puzzles to promote relaxation and positive mood. She shared that she tries to stay busy and practice on focusing on self-care Patient continues to drive and take care of all household expenses independently Patient states that her son is doing well and continues to be a strong support system for her. Patient talks frequently with her best friend and neighbors Patient continues to read, pray, and complete devotions to cope with stressors. Patient is successfully completing her own errands and drinks a lot of water to remain healthy. She reports compliance with medication management Mindfulness or Relaxation Training, Active listening / Reflection utilized , Emotional Supportive Provided, and Psychoeducation for mental health needs  Discussed plans with patient for ongoing care management follow up and provided patient with direct contact information for care management team Collaboration with PCP regarding development and update of comprehensive plan of care as evidenced by provider attestation and co-signature Inter-disciplinary care team collaboration (see longitudinal plan of care) Patient Self Care Activities:  Attend all scheduled provider  appointments Continue utilizing healthy coping skills and support system Contact clinic with any questions or concerns      [name], MSW, LCSW [name] Care Management [place]  [facility] [email or web address] Phone [phone] 5:17 PM

## 2020-10-03 ENCOUNTER — Telehealth: Payer: Self-pay | Admitting: *Deleted

## 2020-10-03 NOTE — Chronic Care Management (AMB) (Signed)
  Care Management   Note  10/03/2020 Name: Dazha WILHELMINE KROGSTAD MRN: [id number] DOB: 1950/11/23  Ramani T Couse is a 70 y.o. year old female who is a primary care patient of Lorre Munroe, NP and is actively engaged with the care management team. I reached out to Nancie T Wible by phone today to assist with re-scheduling a follow up visit with the RN Case Manager  Follow up plan: Telephone appointment with care management team member scheduled for:10/20/20  [facility] Guide, Embedded Care Coordination [facility]  Care Management  Direct Dial: [phone]

## 2020-10-13 ENCOUNTER — Telehealth: Payer: Self-pay

## 2020-10-20 ENCOUNTER — Telehealth: Payer: Self-pay | Admitting: General Practice

## 2020-10-20 ENCOUNTER — Ambulatory Visit (INDEPENDENT_AMBULATORY_CARE_PROVIDER_SITE_OTHER): Payer: Medicare HMO | Admitting: General Practice

## 2020-10-20 ENCOUNTER — Other Ambulatory Visit: Payer: Self-pay | Admitting: Internal Medicine

## 2020-10-20 DIAGNOSIS — F418 Other specified anxiety disorders: Secondary | ICD-10-CM

## 2020-10-20 DIAGNOSIS — M1990 Unspecified osteoarthritis, unspecified site: Secondary | ICD-10-CM | POA: Diagnosis not present

## 2020-10-20 DIAGNOSIS — I10 Essential (primary) hypertension: Secondary | ICD-10-CM

## 2020-10-20 DIAGNOSIS — F419 Anxiety disorder, unspecified: Secondary | ICD-10-CM

## 2020-10-20 DIAGNOSIS — F319 Bipolar disorder, unspecified: Secondary | ICD-10-CM

## 2020-10-20 NOTE — Patient Instructions (Signed)
Visit Information  PATIENT GOALS:  Goals Addressed             This Visit's Progress    RNCM: Manage Chronic Pain       Timeframe:  Long-Range Goal Priority:  High Start Date:      10-20-2020                       Expected End Date:     10-20-2021                  Follow Up Date 01-12-2021   - call for medicine refill 2 or 3 days before it runs out - develop a personal pain management plan - keep track of prescription refills - plan exercise or activity when pain is best controlled - prioritize tasks for the day - track times pain is worst and when it is best - track what makes the pain worse and what makes it better - use ice or heat for pain relief - work slower and less intense when having pain    Why is this important?   Day-to-day life can be hard when you have chronic pain.  Pain medicine is just one piece of the treatment puzzle.  You can try these action steps to help you manage your pain.    Notes: 10-20-2020: The patient states that she has arthritis pain in her right knee. She denies any falls or safety concerns. The patient takes Tylenol which is effective for her pain she has. Denies any pain today. Knows how to effectively manage pain. Instructed the patient to call the office for changes in level or intensity of pain or discomfort.         The patient verbalized understanding of instructions, educational materials, and care plan provided today and declined offer to receive copy of patient instructions, educational materials, and care plan.   Face to Face appointment with care management team member scheduled for: 01-12-2021 at 1 pm  Alto Denver RN, MSN, CCM Community Care Coordinator [facility]  [facility] [place] Mobile: [phone]

## 2020-10-20 NOTE — Telephone Encounter (Signed)
  Care Management   Follow Up Note   10/20/2020 Name: Katrina Sutton MRN: [id number] DOB: 09/06/1950   Referred by: Lorre Munroe, NP Reason for referral : Chronic Care Management (RNCM: Follow up for Chronic Disease Management and Care Coordination Needs )   The patient returned phone call. Call completed. See new encounter.   Follow Up Plan: Face to Face appointment with care management team member scheduled for:  01-12-2021 at 1 pm  Alto Denver RN, MSN, CCM Community Care Coordinator [facility]  [facility] [place] Mobile: [phone]
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
## 2020-10-23 ENCOUNTER — Ambulatory Visit: Payer: Medicare HMO | Admitting: General Surgery

## 2020-11-24 ENCOUNTER — Other Ambulatory Visit: Payer: Self-pay | Admitting: Internal Medicine

## 2020-11-25 ENCOUNTER — Telehealth: Payer: Self-pay

## 2020-11-26 ENCOUNTER — Telehealth: Payer: Self-pay | Admitting: Licensed Clinical Social Worker

## 2020-11-26 NOTE — Telephone Encounter (Signed)
    Clinical Social Work  Care Management   Phone Outreach    11/26/2020 Name: Dondi AROHI SALVATIERRA MRN: [id number] DOB: 08-28-1950  Beva T Klugh is a 70 y.o. year old female who is a primary care patient of Lorre Munroe, NP .   Reason for referral: Mental Health Counseling and Resources.    F/U phone call today to assess needs, progress and barriers with care plan goals.   Telephone outreach was unsuccessful. A HIPPA compliant phone message was left for the patient providing contact information and requesting a return call.   Plan:CCM LCSW will wait for return call. If no return call is received, appointment was rescheduled with CCM   Review of patient status, including review of consultants reports, relevant laboratory and other test results, and collaboration with appropriate care team members and the patient's provider was performed as part of comprehensive patient evaluation and provision of care management services.    [name], MSW, LCSW [name] Care Management [place]  [facility] [email or web address] Phone [phone] 9:37 AM

## 2020-12-10 ENCOUNTER — Ambulatory Visit (INDEPENDENT_AMBULATORY_CARE_PROVIDER_SITE_OTHER): Payer: Medicare HMO | Admitting: Licensed Clinical Social Worker

## 2020-12-10 DIAGNOSIS — F418 Other specified anxiety disorders: Secondary | ICD-10-CM

## 2020-12-10 DIAGNOSIS — I10 Essential (primary) hypertension: Secondary | ICD-10-CM

## 2020-12-10 DIAGNOSIS — F319 Bipolar disorder, unspecified: Secondary | ICD-10-CM

## 2020-12-10 NOTE — Chronic Care Management (AMB) (Signed)
Chronic Care Management    Clinical Social Work Note  12/10/2020 Name: Katrina Sutton MRN: [id number] DOB: Nov 11, 1950  Katrina Sutton is a 70 y.o. year old female who is a primary care patient of Lorre Munroe, NP. The CCM team was consulted to assist the patient with chronic disease management and/or care coordination needs related to: Mental Health Counseling and Resources.   Engaged with patient by telephone for follow up visit in response to provider referral for social work chronic care management and care coordination services.   Consent to Services:  The patient was given information about Chronic Care Management services, agreed to services, and gave verbal consent prior to initiation of services.  Please see initial visit note for detailed documentation.   Patient agreed to services and consent obtained.   Consent to Services:  The patient was given information about Care Management services, agreed to services, and gave verbal consent prior to initiation of services.  Please see initial visit note for detailed documentation.   Patient agreed to services today and consent obtained.   Assessment: Engaged with patient by phone in response to provider referral for social work care coordination services: Mental Health Counseling and Resources.    Patient continues to maintain positive progress with care plan goals. Reports symptoms of depression and anxiety are managed well. No report of current stressors. See Care Plan below for interventions and patient self-care activities.  Recommendation: Patient may benefit from, and is in agreement work with LCSW to address care coordination needs and will continue to work with the clinical team to address health care and disease management related needs.   Follow up Plan: Patient would like continued follow-up from CCM LCSW .  per patient's request will follow up in 02/24/21.  Will call office if needed prior to next encounter.  SDOH (Social  Determinants of Health) assessments and interventions performed:  NA  Advanced Directives Status: Not addressed in this encounter.  CCM Care Plan  Allergies  Allergen Reactions   Erythromycin Shortness Of Breath and Rash   Duloxetine Palpitations    Outpatient Encounter Medications as of 12/10/2020  Medication Sig Note   acetaminophen (TYLENOL) 500 MG tablet Take 500 mg by mouth every 8 (eight) hours as needed for headache.    clonazePAM (KLONOPIN) 0.5 MG tablet TAKE 1 TABLET BY MOUTH 3 TIMES DAILY    clonazePAM (KLONOPIN) 1 MG tablet Take 0.5 tablets (0.5 mg total) by mouth 3 (three) times daily as needed for anxiety.    hydrocortisone cream 1 % Apply 1 application topically 2 (two) times daily as needed for itching.  06/19/2019: Very seldom    omeprazole (PRILOSEC) 40 MG capsule Take 1 capsule (40 mg total) by mouth every morning.    PARoxetine (PAXIL) 40 MG tablet Take 1 tablet (40 mg total) by mouth daily.    polyethylene glycol (MIRALAX / GLYCOLAX) 17 g packet Take 17 g by mouth daily.    No facility-administered encounter medications on file as of 12/10/2020.    Patient Active Problem List   Diagnosis Date Noted   Hypertension 08/30/2019   Constipation 04/17/2019   Bipolar 1 disorder ([facility]) 08/23/2017   GERD (gastroesophageal reflux disease) 12/27/2016   Arthritis 12/27/2016   Depression with anxiety 12/27/2016    Conditions to be addressed/monitored: Anxiety, Depression, and Bipolar Disorder; Mental Health Concerns   Care Plan : General Social Work (Adult)  Updates made by Bridgett Larsson, LCSW since 12/10/2020 12:00 AM  Problem: Coping Skills (General Plan of Care)      Long-Range Goal: Coping Skills Enhanced   Start Date: 05/06/2020  This Visit's Progress: On track  Recent Progress: On track  Priority: Medium  Note:   Timeframe:  Long-Range Goal Priority:  Medium Start Date:  05/06/20                         Expected End Date:  03/14/21                      Follow Up Date -02/24/21   Current Barriers:  Limited social support Mental Health Concerns  Social Isolation Limited access to caregiver Lacks knowledge of community resource: available mental health resources that she can utilize  Clinical Social Work Clinical Goal(s):  Over the next 120 days, client will work with SW to address concerns related to decreasing anxious symptoms by implementing additional mental health support and coping skills into her routine.  Interventions: Patient interviewed and appropriate assessments performed Assessed patient's previous and current treatment, coping skills, support system and barriers to care Patient reports symptoms of depression and anxiety associated with Bipolar Disorder had increased for a short while. Patient contributes this to medication adjustments by PCP Patient shared anxiety symptoms are managed well. She denies current stressors Patient continues to pray and complete puzzles to promote relaxation and positive mood. She shared that she tries to stay busy and practice on focusing on self-care Patient continues to drive and take care of all household expenses independently Patient states that her son is doing well and continues to be a strong support system for her. Patient talks frequently with her best friend and neighbors Patient continues to read, pray, and complete devotions to cope with stressors. Patient is successfully completing her own errands and drinks a lot of water to remain healthy. She reports compliance with medication management Mindfulness or Relaxation Training, Active listening / Reflection utilized , Emotional Supportive Provided, and Psychoeducation for mental health needs  Discussed plans with patient for ongoing care management follow up and provided patient with direct contact information for care management team Collaboration with PCP regarding development and update of comprehensive plan of care as evidenced by  provider attestation and co-signature Inter-disciplinary care team collaboration (see longitudinal plan of care) Patient Self Care Activities:  Attend all scheduled provider appointments Continue utilizing healthy coping skills and support system Contact clinic with any questions or concerns       [name], MSW, LCSW [name] Care Management [place]  [facility] [email or web address] Phone [phone] 4:16 PM

## 2020-12-10 NOTE — Patient Instructions (Signed)
Visit Information   Goals Addressed               This Visit's Progress     Patient Stated     SW- I would like to gain additional support to manage my anxiety better (pt-stated)   On track     Patient Self Care Activities:  Attend all scheduled provider appointments Continue utilizing healthy coping skills and support system Contact clinic with any questions or concerns      Other     SW-Track and Manage My Symptoms-Depression   On track     Timeframe:  Long-Range Goal Priority:  Medium Start Date:  05/06/20                         Expected End Date:  03/14/21                     Follow Up Date - 02/24/21  Patient Self Care Activities:  Attend all scheduled provider appointments Continue utilizing healthy coping skills and support system Contact clinic with any questions or concerns        Patient verbalizes understanding of instructions provided today.   Telephone follow up appointment with care management team member scheduled for:02/24/21  [name], MSW, LCSW [name] Management [facility]  [facility] [email or web address] Phone [phone] 4:19 PM

## 2020-12-12 DIAGNOSIS — I10 Essential (primary) hypertension: Secondary | ICD-10-CM

## 2020-12-12 DIAGNOSIS — F418 Other specified anxiety disorders: Secondary | ICD-10-CM

## 2020-12-22 ENCOUNTER — Other Ambulatory Visit: Payer: Self-pay | Admitting: Internal Medicine

## 2020-12-22 NOTE — Telephone Encounter (Signed)
Requested medications are due for refill today.  yes  Requested medications are on the active medications list.  yes  Last refill. 11/25/2020  Future visit scheduled.   yes  Notes to clinic.  Medication not delegated. 

## 2021-01-01 ENCOUNTER — Encounter: Payer: Self-pay | Admitting: General Surgery

## 2021-01-12 ENCOUNTER — Ambulatory Visit: Payer: Medicare HMO | Admitting: General Practice

## 2021-01-12 ENCOUNTER — Encounter: Payer: Self-pay | Admitting: Internal Medicine

## 2021-01-12 ENCOUNTER — Other Ambulatory Visit: Payer: Self-pay

## 2021-01-12 ENCOUNTER — Ambulatory Visit (INDEPENDENT_AMBULATORY_CARE_PROVIDER_SITE_OTHER): Payer: Medicare HMO

## 2021-01-12 ENCOUNTER — Ambulatory Visit (INDEPENDENT_AMBULATORY_CARE_PROVIDER_SITE_OTHER): Payer: Medicare HMO | Admitting: Internal Medicine

## 2021-01-12 VITALS — BP 139/89 | HR 79 | Temp 97.3°F | Resp 17 | Ht 66.0 in | Wt 150.8 lb

## 2021-01-12 DIAGNOSIS — Z1159 Encounter for screening for other viral diseases: Secondary | ICD-10-CM | POA: Diagnosis not present

## 2021-01-12 DIAGNOSIS — I700 Atherosclerosis of aorta: Secondary | ICD-10-CM

## 2021-01-12 DIAGNOSIS — R7309 Other abnormal glucose: Secondary | ICD-10-CM

## 2021-01-12 DIAGNOSIS — F3289 Other specified depressive episodes: Secondary | ICD-10-CM | POA: Diagnosis not present

## 2021-01-12 DIAGNOSIS — N289 Disorder of kidney and ureter, unspecified: Secondary | ICD-10-CM | POA: Diagnosis not present

## 2021-01-12 DIAGNOSIS — F418 Other specified anxiety disorders: Secondary | ICD-10-CM | POA: Diagnosis not present

## 2021-01-12 DIAGNOSIS — F419 Anxiety disorder, unspecified: Secondary | ICD-10-CM

## 2021-01-12 DIAGNOSIS — F319 Bipolar disorder, unspecified: Secondary | ICD-10-CM

## 2021-01-12 DIAGNOSIS — Z0000 Encounter for general adult medical examination without abnormal findings: Secondary | ICD-10-CM

## 2021-01-12 DIAGNOSIS — I10 Essential (primary) hypertension: Secondary | ICD-10-CM | POA: Diagnosis not present

## 2021-01-12 DIAGNOSIS — K219 Gastro-esophageal reflux disease without esophagitis: Secondary | ICD-10-CM | POA: Diagnosis not present

## 2021-01-12 MED ORDER — PANTOPRAZOLE SODIUM 40 MG PO TBEC: 40.0000 mg | DELAYED_RELEASE_TABLET | Freq: Every day | ORAL | 1 refills | Status: DC

## 2021-01-12 NOTE — Assessment & Plan Note (Signed)
Will change Omeprazole to Pantoprazole Avoid foods that trigger your reflux Encourage weight loss as this can help reduce reflux symptoms

## 2021-01-12 NOTE — Patient Instructions (Signed)
Visit Information  The patient verbalized understanding of instructions, educational materials, and care plan provided today and declined offer to receive copy of patient instructions, educational materials, and care plan.   Telephone follow up appointment with care management team member scheduled for: 03-02-2021 at 345 pm [place] RN, MSN, CCM Community Care Coordinator [facility]  [facility] [place] Mobile: [phone]
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
## 2021-01-12 NOTE — Progress Notes (Signed)
HPI:  Pt presents to the clinic today for her subsequent annual Medicare Wellness Exam.  Past Medical History:  Diagnosis Date   Allergy    seasonal   Anxiety    Arthritis    osteoarthritis   Depression    GERD (gastroesophageal reflux disease)    Hiatal hernia     Current Outpatient Medications  Medication Sig Dispense Refill   acetaminophen (TYLENOL) 500 MG tablet Take 500 mg by mouth every 8 (eight) hours as needed for headache.     clonazePAM (KLONOPIN) 0.5 MG tablet TAKE 1 TABLET BY MOUTH 3 TIMES DAILY 90 tablet 0   clonazePAM (KLONOPIN) 1 MG tablet Take 0.5 tablets (0.5 mg total) by mouth 3 (three) times daily as needed for anxiety. 45 tablet 0   hydrocortisone cream 1 % Apply 1 application topically 2 (two) times daily as needed for itching.      omeprazole (PRILOSEC) 40 MG capsule Take 1 capsule (40 mg total) by mouth every morning. 90 capsule 1   PARoxetine (PAXIL) 40 MG tablet Take 1 tablet (40 mg total) by mouth daily. 90 tablet 1   polyethylene glycol (MIRALAX / GLYCOLAX) 17 g packet Take 17 g by mouth daily.     No current facility-administered medications for this visit.    Allergies  Allergen Reactions   Erythromycin Shortness Of Breath and Rash   Duloxetine Palpitations    Family History  Problem Relation Age of Onset   Mental illness Mother    Ulcers Father    Stroke Neg Hx    Heart attack Neg Hx    Cancer Neg Hx     Social History   Socioeconomic History   Marital status: Married    Spouse name: Not on file   Number of children: Not on file   Years of education: Not on file   Highest education level: Not on file  Occupational History   Occupation: retired  Tobacco Use   Smoking status: Former    Types: Cigarettes    Quit date: 12/27/1976    Years since quitting: 44.0   Smokeless tobacco: Never  Vaping Use   Vaping Use: Never used  Substance and Sexual Activity   Alcohol use: No   Drug use: No   Sexual activity: Not Currently    Birth  control/protection: None  Other Topics Concern   Not on file  Social History Narrative   Not on file   Social Determinants of Health   Financial Resource Strain: Low Risk    Difficulty of Paying Living Expenses: Not hard at all  Food Insecurity: No Food Insecurity   Worried About Programme researcher, broadcasting/film/video in the Last Year: Never true   Ran Out of Food in the Last Year: Never true  Transportation Needs: No Transportation Needs   Lack of Transportation (Medical): No   Lack of Transportation (Non-Medical): No  Physical Activity: Inactive   Days of Exercise per Week: 0 days   Minutes of Exercise per Session: 0 min  Stress: No Stress Concern Present   Feeling of Stress : Only a little  Social Connections: Moderately Isolated   Frequency of Communication with Friends and Family: More than three times a week   Frequency of Social Gatherings with Friends and Family: More than three times a week   Attends Religious Services: Never   Database administrator or Organizations: No   Attends Banker Meetings: Never   Marital Status: Married  Catering manager  Violence: Not At Risk   Fear of Current or Ex-Partner: No   Emotionally Abused: No   Physically Abused: No   Sexually Abused: No    Hospitiliaztions: None  Health Maintenance:    Flu: never  Tetanus: 09/2015  Pneumovax: never  Prevnar: never  Shingrix: never  Covid: never  Mammogram: more than 5 year  Pap Smear: more than 5 year  Bone Density: never  Colon Screening: > 10 years ago  Eye Doctor: as needed  Dental Exam: as needed   Providers:   PCP: Nicki Reaper, NP   I have personally reviewed and have noted:  1. The patient's medical and social history 2. Their use of alcohol, tobacco or illicit drugs 3. Their current medications and supplements 4. The patient's functional ability including ADL's, fall risks, home safety risks and hearing or visual impairment. 5. Diet and physical activities 6. Evidence for  depression or mood disorder  Subjective:   Review of Systems:   Constitutional: Denies fever, malaise, fatigue, headache or abrupt weight changes.  HEENT: Denies eye pain, eye redness, ear pain, ringing in the ears, wax buildup, runny nose, nasal congestion, bloody nose, or sore throat. Respiratory: Denies difficulty breathing, shortness of breath, cough or sputum production.   Cardiovascular: Denies chest pain, chest tightness, palpitations or swelling in the hands or feet.  Gastrointestinal: Pt reports reflux, intermittent constipation. Denies abdominal pain, bloating, diarrhea or blood in the stool.  GU: Denies urgency, frequency, pain with urination, burning sensation, blood in urine, odor or discharge. Musculoskeletal: Pt reports joint pain. Denies decrease in range of motion, difficulty with gait, muscle pain or joint swelling.  Skin: Denies redness, rashes, lesions or ulcercations.  Neurological: Denies dizziness, difficulty with memory, difficulty with speech or problems with balance and coordination.  Psych: Pt has a history of anxiety and depression. Denies SI/HI.  No other specific complaints in a complete review of systems (except as listed in HPI above).  Objective:  PE:   BP 139/89 (BP Location: Left Arm, Patient Position: Sitting, Cuff Size: Normal)   Pulse 79   Temp (!) 97.3 F (36.3 C) (Temporal)   Resp 17   Ht 5\' 6"  (1.676 m)   Wt 150 lb 12.8 oz (68.4 kg)   SpO2 97%   BMI 24.34 kg/m   Wt Readings from Last 3 Encounters:  07/11/20 150 lb 3.2 oz (68.1 kg)  06/24/20 150 lb (68 kg)  03/06/20 151 lb 3.2 oz (68.6 kg)    General: Appears her stated age, well developed, well nourished in NAD. Skin: Warm, dry and intact.  HEENT: Head: normal shape and size; Eyes: EOMs intact;  Neck: Neck supple, trachea midline. No masses, lumps or thyromegaly present.  Cardiovascular: Normal rate and rhythm. S1,S2 noted.  No murmur, rubs or gallops noted. No JVD or BLE edema. No  carotid bruits noted. Pulmonary/Chest: Normal effort and positive vesicular breath sounds. No respiratory distress. No wheezes, rales or ronchi noted.  Abdomen: Soft and nontender. Normal bowel sounds. Umbilical hernia noted. Liver, spleen and kidneys non palpable. Musculoskeletal: Strength 5/5 BUE/BLE. No difficulty with gait. Neurological: Alert and oriented. Cranial nerves II-XII grossly intact. Coordination normal.  Psychiatric: Mood and affect normal. Behavior is normal. Judgment and thought content normal.    BMET    Component Value Date/Time   NA 143 07/15/2020 0857   NA 142 10/11/2012 2133   K 4.2 07/15/2020 0857   K 3.5 10/11/2012 2133   CL 106 07/15/2020 0857  CL 107 10/11/2012 2133   CO2 30 07/15/2020 0857   CO2 26 10/11/2012 2133   GLUCOSE 93 07/15/2020 0857   GLUCOSE 105 (H) 10/11/2012 2133   BUN 10 07/15/2020 0857   BUN 12 10/11/2012 2133   CREATININE 0.97 (H) 07/15/2020 0857   CALCIUM 9.5 07/15/2020 0857   CALCIUM 9.7 10/11/2012 2133   GFRNONAA >60 11/01/2019 0431   GFRNONAA 62 10/25/2019 1003   GFRAA >60 11/01/2019 0431   GFRAA 72 10/25/2019 1003    Lipid Panel     Component Value Date/Time   CHOL 167 10/25/2019 1003   TRIG 132 10/25/2019 1003   HDL 45 (L) 10/25/2019 1003   CHOLHDL 3.7 10/25/2019 1003   LDLCALC 99 10/25/2019 1003    CBC    Component Value Date/Time   WBC 5.7 07/15/2020 0857   RBC 5.16 (H) 07/15/2020 0857   HGB 15.3 07/15/2020 0857   HGB 14.9 10/11/2012 2133   HCT 47.4 (H) 07/15/2020 0857   HCT 41.7 10/11/2012 2133   PLT 267 07/15/2020 0857   PLT 249 10/11/2012 2133   MCV 91.9 07/15/2020 0857   MCV 87 10/11/2012 2133   MCH 29.7 07/15/2020 0857   MCHC 32.3 07/15/2020 0857   RDW 13.3 07/15/2020 0857   RDW 13.6 10/11/2012 2133   LYMPHSABS 1.4 10/29/2019 0548   LYMPHSABS 2.0 06/28/2011 1412   MONOABS 0.9 10/29/2019 0548   MONOABS 0.7 06/28/2011 1412   EOSABS 0.1 10/29/2019 0548   EOSABS 0.0 06/28/2011 1412   BASOSABS 0.0  10/29/2019 0548   BASOSABS 0.0 06/28/2011 1412    Hgb A1C No results found for: HGBA1C    Assessment and Plan:   Medicare Annual Wellness Visit:  Diet: She does eat some meat. She consumes fruits and veggies. She tries to avoid fried foods. She drinks mostly water and sweet tea. Physical activity: Walking Depression/mood screen: Negative, PHQ 9 score of 0 Hearing: Intact to whispered voice Visual acuity: Grossly normal ADLs: Capable Fall risk: None Home safety: Good Cognitive evaluation: Intact to orientation, naming, recall and repetition EOL planning: No adv directives, DNR/ I agree  Preventative Medicine: She declines flu, pneumovax, prevnar, shingrix of covid vaccines. Tetanus UTD. She declines mammogram, bone density, pap smear or colon cancer screening at this time. Encouraged her to consume a balanced diet and exercise regimen. Advised her to see an eye doctor and dentist annually. Will check CBC, CMET, Lipid and Hep C today. Due dates for screening exams given to patient as part of her AVS.   Next appointment: 6 months, follow up chronic conditions   Nicki Reaper, NP This visit occurred during the SARS-CoV-2 public health emergency.  Safety protocols were in place, including screening questions prior to the visit, additional usage of staff PPE, and extensive cleaning of exam room while observing appropriate contact time as indicated for disinfecting solutions.

## 2021-01-12 NOTE — Patient Instructions (Signed)

## 2021-01-12 NOTE — Assessment & Plan Note (Signed)
Encouraged low fat diet 

## 2021-01-13 LAB — LIPID PANEL
Cholesterol: 178 mg/dL (ref <200)
HDL: 49 mg/dL — ABNORMAL LOW (ref >=50)
LDL Cholesterol (Calc): 106 mg/dL (calc) — ABNORMAL HIGH
Non-HDL Cholesterol (Calc): 129 mg/dL (calc) (ref <130)
Total CHOL/HDL Ratio: 3.6 (calc) (ref <5.0)
Triglycerides: 132 mg/dL (ref <150)

## 2021-01-13 LAB — COMPLETE METABOLIC PANEL WITH GFR
AG Ratio: 1.7 (calc) (ref 1.0–2.5)
ALT: 10 U/L (ref 6–29)
AST: 16 U/L (ref 10–35)
Albumin: 4.1 g/dL (ref 3.6–5.1)
Alkaline phosphatase (APISO): 118 U/L (ref 37–153)
BUN/Creatinine Ratio: 10 (calc) (ref 6–22)
BUN: 11 mg/dL (ref 7–25)
CO2: 26 mmol/L (ref 20–32)
Calcium: 9.2 mg/dL (ref 8.6–10.4)
Chloride: 105 mmol/L (ref 98–110)
Creat: 1.15 mg/dL — ABNORMAL HIGH (ref 0.60–1.00)
Globulin: 2.4 g/dL (calc) (ref 1.9–3.7)
Glucose, Bld: 105 mg/dL (ref 65–139)
Potassium: 3.9 mmol/L (ref 3.5–5.3)
Sodium: 140 mmol/L (ref 135–146)
Total Bilirubin: 0.9 mg/dL (ref 0.2–1.2)
Total Protein: 6.5 g/dL (ref 6.1–8.1)
eGFR: 51 mL/min/{1.73_m2} — ABNORMAL LOW (ref >=60)

## 2021-01-13 LAB — CBC
HCT: 43.8 % (ref 35.0–45.0)
Hemoglobin: 15 g/dL (ref 11.7–15.5)
MCH: 30.8 pg (ref 27.0–33.0)
MCHC: 34.2 g/dL (ref 32.0–36.0)
MCV: 89.9 fL (ref 80.0–100.0)
MPV: 9.3 fL (ref 7.5–12.5)
Platelets: 241 10*3/uL (ref 140–400)
RBC: 4.87 10*6/uL (ref 3.80–5.10)
RDW: 13 % (ref 11.0–15.0)
WBC: 6.8 10*3/uL (ref 3.8–10.8)

## 2021-01-13 LAB — HEMOGLOBIN A1C
Hgb A1c MFr Bld: 5.4 % of total Hgb (ref <5.7)
Mean Plasma Glucose: 108 mg/dL
eAG (mmol/L): 6 mmol/L

## 2021-01-13 LAB — HEPATITIS C ANTIBODY
Hepatitis C Ab: NONREACTIVE
SIGNAL TO CUT-OFF: 0.09 (ref <1.00)

## 2021-01-13 NOTE — Addendum Note (Signed)
Addended by: Lorre Munroe on: 01/13/2021 09:10 AM   Modules accepted: Orders

## 2021-01-16 ENCOUNTER — Ambulatory Visit: Payer: Self-pay | Admitting: *Deleted

## 2021-01-16 NOTE — Telephone Encounter (Signed)
Pt given lab results per notes of R. Baity, NP from 01/13/21 on 01/16/21. Pt verbalized understanding to increase water intake and repeat labs in 2 weeks appt scheduled for 01/27/21. Requested information how to improve cholesterol. Encouraged patient to exercise or walk 30 minutes a day 5 x week and monitor high fat foods, increase fruits and vegetables. Patient verbalized understanding .

## 2021-01-19 ENCOUNTER — Other Ambulatory Visit: Payer: Self-pay | Admitting: Internal Medicine

## 2021-01-19 NOTE — Telephone Encounter (Signed)
Requested medication (s) are due for refill today: due 01/23/21  Requested medication (s) are on the active medication list: yes   Last refill:  12/23/20 #90 0 refills  Future visit scheduled: yes   Notes to clinic:  not delegated per protocol.      Requested Prescriptions  Pending Prescriptions Disp Refills   clonazePAM (KLONOPIN) 0.5 MG tablet [Pharmacy Med Name: CLONAZEPAM 0.5 MG TAB] 90 tablet     Sig: TAKE 1 TABLET BY MOUTH 3 TIMES DAILY     Not Delegated - Psychiatry:  Anxiolytics/Hypnotics Failed - 01/19/2021 10:06 AM      Failed - This refill cannot be delegated      Failed - Urine Drug Screen completed in last 360 days      Passed - Valid encounter within last 6 months    Recent Outpatient Visits           1 week ago Medicare annual wellness visit, subsequent   [facility] [place] [place], Salvadore Oxford, NP   6 months ago Arthritis   [facility] [place], Salvadore Oxford, NP   10 months ago Depression with anxiety   [facility], Jodelle Gross, FNP   1 year ago Essential hypertension   [facility], Jodelle Gross, FNP   1 year ago Hospital discharge follow-up   [facility], Jodelle Gross, FNP       Future Appointments             In 5 months  [facility], [facility]   In 5 months [place], Salvadore Oxford, NP [facility], [facility]

## 2021-01-20 NOTE — Telephone Encounter (Signed)
Pt called in stating her medication will run out on the 13th, and the pharmacy will be closed on the 13th and she will need it refilled, please advise.

## 2021-01-21 NOTE — Telephone Encounter (Signed)
Called pt, aware messages sent, waiting review. Pt concerned as pharmacy closed Sunday, day she runs out of meds. Assured pt would route to practice for PCPs review.

## 2021-01-21 NOTE — Telephone Encounter (Signed)
Patient called waiting on status of medication. Oplease call back

## 2021-01-22 ENCOUNTER — Other Ambulatory Visit: Payer: Self-pay | Admitting: Internal Medicine

## 2021-01-22 NOTE — Telephone Encounter (Signed)
Requested medication (s) are due for refill today: Yes  Requested medication (s) are on the active medication list: Yes  Last refill:  12/16/20  Future visit scheduled: Yes  Notes to clinic:  See request.    Requested Prescriptions  Pending Prescriptions Disp Refills   clonazePAM (KLONOPIN) 0.5 MG tablet [Pharmacy Med Name: CLONAZEPAM 0.5 MG TAB] 90 tablet     Sig: TAKE 1 TABLET BY MOUTH 3 TIMES DAILY     Not Delegated - Psychiatry:  Anxiolytics/Hypnotics Failed - 01/22/2021 10:54 AM      Failed - This refill cannot be delegated      Failed - Urine Drug Screen completed in last 360 days      Passed - Valid encounter within last 6 months    Recent Outpatient Visits           1 week ago Medicare annual wellness visit, subsequent   [facility] [place], Salvadore Oxford, NP   6 months ago Arthritis   [facility] [place], Salvadore Oxford, NP   10 months ago Depression with anxiety   [facility], Jodelle Gross, FNP   1 year ago Essential hypertension   [facility], Jodelle Gross, FNP   1 year ago Hospital discharge follow-up   [facility], Jodelle Gross, FNP       Future Appointments             In 5 months  [facility], [facility]   In 5 months [place], Salvadore Oxford, NP [facility], [facility]

## 2021-01-23 ENCOUNTER — Ambulatory Visit (INDEPENDENT_AMBULATORY_CARE_PROVIDER_SITE_OTHER): Payer: Medicare HMO

## 2021-01-23 ENCOUNTER — Other Ambulatory Visit: Payer: Self-pay | Admitting: Internal Medicine

## 2021-01-23 DIAGNOSIS — F319 Bipolar disorder, unspecified: Secondary | ICD-10-CM

## 2021-01-23 DIAGNOSIS — F418 Other specified anxiety disorders: Secondary | ICD-10-CM

## 2021-01-23 DIAGNOSIS — F419 Anxiety disorder, unspecified: Secondary | ICD-10-CM

## 2021-01-23 MED ORDER — CLONAZEPAM 0.5 MG PO TABS: 0.5000 mg | ORAL_TABLET | Freq: Three times a day (TID) | ORAL | 0 refills | Status: DC

## 2021-01-23 NOTE — Chronic Care Management (AMB) (Signed)
Chronic Care Management   CCM RN Visit Note  01/23/2021 Name: Katrina Sutton MRN: [id number] DOB: 11/27/1950  Subjective: Katrina Sutton is a 70 y.o. year old female who is a primary care patient of Jearld Fenton, NP. The care management team was consulted for assistance with disease management and care coordination needs.    Engaged with patient by telephone for follow up visit in response to provider referral for case management and/or care coordination services.   Consent to Services:  The patient was given information about Chronic Care Management services, agreed to services, and gave verbal consent prior to initiation of services.  Please see initial visit note for detailed documentation.   Patient agreed to services and verbal consent obtained.   Assessment: Review of patient past medical history, allergies, medications, health status, including review of consultants reports, laboratory and other test data, was performed as part of comprehensive evaluation and provision of chronic care management services.   SDOH (Social Determinants of Health) assessments and interventions performed:    CCM Care Plan  Allergies  Allergen Reactions   Erythromycin Shortness Of Breath and Rash   Duloxetine Palpitations    Outpatient Encounter Medications as of 01/23/2021  Medication Sig Note   acetaminophen (TYLENOL) 500 MG tablet Take 500 mg by mouth every 8 (eight) hours as needed for headache.    clonazePAM (KLONOPIN) 0.5 MG tablet Take 1 tablet (0.5 mg total) by mouth 3 (three) times daily.    hydrocortisone cream 1 % Apply 1 application topically 2 (two) times daily as needed for itching.  06/19/2019: Very seldom    pantoprazole (PROTONIX) 40 MG tablet Take 1 tablet (40 mg total) by mouth daily.    PARoxetine (PAXIL) 40 MG tablet Take 1 tablet (40 mg total) by mouth daily.    polyethylene glycol (MIRALAX / GLYCOLAX) 17 g packet Take 17 g by mouth daily.    No facility-administered encounter  medications on file as of 01/23/2021.    Patient Active Problem List   Diagnosis Date Noted   Aortic atherosclerosis ([place]) 01/12/2021   Hypertension 08/30/2019   Constipation 04/17/2019   Bipolar 1 disorder ([place]) 08/23/2017   GERD (gastroesophageal reflux disease) 12/27/2016   Arthritis 12/27/2016   GAD (generalized anxiety disorder) 12/27/2016    Conditions to be addressed/monitored:Anxiety, Depression, and Bipolar Disorder  Care Plan : RNCM: General Plan of Care (Adult) for Chronic Disease Management and Care Coordination Needs  Updates made by Vanita Ingles, RN since 01/23/2021 12:00 AM     Problem: RNCM: Development of Plan of Care for Chronic Disease Management (Anxiety, Depression, bipolar, and HTN)   Priority: High     Long-Range Goal: RNCM: Effective Management  of Plan of Care for Chronic Disease Management (Anxiety, Depression, bipolar, and HTN)   Start Date: 01/12/2021  Expected End Date: 01/12/2022  Priority: High  Note:   Current Barriers:  Knowledge Deficits related to plan of care for management of HTN and Anxiety with Excessive Worry, Panic Symptoms, Social Anxiety,, Depression: depressed mood anxiety panic attacks, Bipolar Disorder, and Mood Instability  Chronic Disease Management support and education needs related to HTN and Anxiety with Excessive Worry, Panic Symptoms, Social Anxiety,, Depression: depressed mood anxiety panic attacks, and Bipolar Disorder Lacks caregiver support.         RNCM Clinical Goal(s):  Patient will verbalize understanding of plan for management of HTN, Anxiety, Depression, and Bipolar Disorder as evidenced by following the plan of care, seeing pcp on a  regular basis, calling office for changes in conditions  take all medications exactly as prescribed and will call provider for medication related questions as evidenced by compliance with medications regimen and calling the office for changes or questions     demonstrate  improved and ongoing health management independence as evidenced by normalized chronic conditions without exacerbations         demonstrate a decrease in HTN, Anxiety, Depression, and Bipolar Disorder exacerbations  as evidenced by working with the CCM team and pcp to optimize health and well being  demonstrate ongoing self health care management ability for effective management of chronic conditions  as evidenced by working with the CCM team     through collaboration with Consulting civil engineer, provider, and care team.   Interventions: 1:1 collaboration with primary care provider regarding development and update of comprehensive plan of care as evidenced by provider attestation and co-signature Inter-disciplinary care team collaboration (see longitudinal plan of care) Evaluation of current treatment plan related to  self management and patient's adherence to plan as established by provider   SDOH Barriers (Status: Goal on Track (progressing): YES.) Long Term Goal  Patient interviewed and SDOH assessment performed        Patient interviewed and appropriate assessments performed Provided patient with information about resources available for help with SDOH and care guides to assist with any new needs or concerns Discussed plans with patient for ongoing care management follow up and provided patient with direct contact information for care management team Advised patient to call the office for changes in SDOH, questions or concerns    Anxiety, Depression, and Bipolar Disorder  (Status: Goal on Track (progressing): YES.) Long Term Goal  Evaluation of current treatment plan related to Anxiety, Depression, and Bipolar Disorder, Mental Health Concerns  self-management and patient's adherence to plan as established by provider. Discussed plans with patient for ongoing care management follow up and provided patient with direct contact information for care management team Advised patient to call the office  for changes mood, anxiety, or depression ; Provided education to patient re: utilizing breathing exercises when she gets upset, read devoltionals, call friends when she is having a bad day, do things that make her happy when she is down; Reviewed medications with patient and discussed compliance. 01-23-2021: The patient called the RNCM and states that she is going to run out of her Klonopin before the weekend is out. She does not want to go through withdrawals. Secure chat with CMA and pcp and the patients refill has been called in today but cannot be refilled until Sunday. Instructed the patient of this. The patient states that she will not be able to pick the medication up until Monday as they are closed on Sunday. She states she will be okay. Reassurance given. The patient appreciated the RNCM talking to her and letting her know the pharmacy had been sent the refill request. Edication and support given.  ; Discussed plans with patient for ongoing care management follow up and provided patient with direct contact information for care management team; Advised patient to discuss concerns or changes in her thoughts, changes in her depression and anxiety levels with provider; Screening for signs and symptoms of depression related to chronic disease state;  Assessed social determinant of health barriers;   Hypertension: (Status: Goal on Track (progressing): YES.) Last practice recorded BP readings:  BP Readings from Last 3 Encounters:  01/12/21 139/89  07/11/20 119/88  03/06/20 123/75  Most recent eGFR/CrCl: No  results found for: EGFR  No components found for: CRCL  Evaluation of current treatment plan related to hypertension self management and patient's adherence to plan as established by provider;   Reviewed prescribed diet heart healthy Reviewed medications with patient and discussed importance of compliance;  Discussed plans with patient for ongoing care management follow up and provided patient  with direct contact information for care management team; Advised patient, providing education and rationale, to monitor blood pressure daily and record, calling PCP for findings outside established parameters;  Provided education on prescribed diet heart healthy;  Discussed complications of poorly controlled blood pressure such as heart disease, stroke, circulatory complications, vision complications, kidney impairment, sexual dysfunction;   Patient Goals/Self-Care Activities: Patient will self administer medications as prescribed as evidenced by self report/primary caregiver report  Patient will attend all scheduled provider appointments as evidenced by clinician review of documented attendance to scheduled appointments and patient/caregiver report Patient will call pharmacy for medication refills as evidenced by patient report and review of pharmacy fill history as appropriate Patient will attend church or other social activities as evidenced by patient report Patient will continue to perform ADL's independently as evidenced by patient/caregiver report Patient will continue to perform IADL's independently as evidenced by patient/caregiver report Patient will call provider office for new concerns or questions as evidenced by review of documented incoming telephone call notes and patient report Patient will work with BSW to address care coordination needs and will continue to work with the clinical team to address health care and disease management related needs as evidenced by documented adherence to scheduled care management/care coordination appointments - check blood pressure weekly - choose a place to take my blood pressure (home, clinic or office, retail store) - write blood pressure results in a log or diary - learn about high blood pressure - keep a blood pressure log - take blood pressure log to all doctor appointments - call doctor for signs and symptoms of high blood pressure -  develop an action plan for high blood pressure - keep all doctor appointments - take medications for blood pressure exactly as prescribed - report new symptoms to your doctor - eat more whole grains, fruits and vegetables, lean meats and healthy fats       Plan:Telephone follow up appointment with care management team member scheduled for:  03-02-2021 at 345 pm  [place], MSN, [place] Mobile: [phone]

## 2021-01-23 NOTE — Patient Instructions (Signed)
Visit Information  Incoming call from the patient needing assistance with her Klonopin refill. Secure chat with the pcp and CMA. Refill has been sent to the pharmacy and can be refilled on 01-25-2021.   The patient verbalized understanding of instructions, educational materials, and care plan provided today and declined offer to receive copy of patient instructions, educational materials, and care plan.   Telephone follow up appointment with care management team member scheduled for: 03-02-2021 at 345 pm  Alto Denver RN, MSN, CCM Community Care Coordinator [facility]  [facility] [place] Mobile: [phone]

## 2021-01-23 NOTE — Addendum Note (Signed)
Addended by: Lonna Cobb on: 01/23/2021 10:08 AM   Modules accepted: Orders

## 2021-01-24 NOTE — Telephone Encounter (Signed)
Duplicate request, already refilled on 01/23/21 by PCP, will refuse this request.   Requested Prescriptions  Pending Prescriptions Disp Refills   clonazePAM (KLONOPIN) 0.5 MG tablet [Pharmacy Med Name: CLONAZEPAM 0.5 MG TAB] 90 tablet     Sig: TAKE 1 TABLET BY MOUTH 3 TIMES DAILY     Not Delegated - Psychiatry:  Anxiolytics/Hypnotics Failed - 01/23/2021 10:21 AM      Failed - This refill cannot be delegated      Failed - Urine Drug Screen completed in last 360 days      Passed - Valid encounter within last 6 months    Recent Outpatient Visits           1 week ago Medicare annual wellness visit, subsequent   [facility], Salvadore Oxford, NP   6 months ago Arthritis   [facility], Salvadore Oxford, NP   10 months ago Depression with anxiety   [facility] Jodelle Gross, FNP   1 year ago Essential hypertension   [facility], Jodelle Gross, FNP   1 year ago Hospital discharge follow-up   [facility], Jodelle Gross, FNP       Future Appointments             In 5 months  [facility], [facility]   In 5 months [facility], Salvadore Oxford, NP [facility]

## 2021-01-26 ENCOUNTER — Other Ambulatory Visit: Payer: Self-pay

## 2021-01-26 DIAGNOSIS — N289 Disorder of kidney and ureter, unspecified: Secondary | ICD-10-CM

## 2021-01-27 ENCOUNTER — Other Ambulatory Visit: Payer: Medicare HMO

## 2021-02-02 ENCOUNTER — Other Ambulatory Visit: Payer: Self-pay

## 2021-02-02 ENCOUNTER — Other Ambulatory Visit: Payer: Medicare HMO

## 2021-02-02 DIAGNOSIS — N289 Disorder of kidney and ureter, unspecified: Secondary | ICD-10-CM | POA: Diagnosis not present

## 2021-02-03 LAB — BASIC METABOLIC PANEL WITH GFR
BUN/Creatinine Ratio: 14 (calc) (ref 6–22)
BUN: 14 mg/dL (ref 7–25)
CO2: 31 mmol/L (ref 20–32)
Calcium: 9.7 mg/dL (ref 8.6–10.4)
Chloride: 106 mmol/L (ref 98–110)
Creat: 1.01 mg/dL — ABNORMAL HIGH (ref 0.60–1.00)
Glucose, Bld: 95 mg/dL (ref 65–99)
Potassium: 4.6 mmol/L (ref 3.5–5.3)
Sodium: 142 mmol/L (ref 135–146)
eGFR: 59 mL/min/{1.73_m2} — ABNORMAL LOW (ref >=60)

## 2021-02-11 DIAGNOSIS — F319 Bipolar disorder, unspecified: Secondary | ICD-10-CM

## 2021-02-11 DIAGNOSIS — I10 Essential (primary) hypertension: Secondary | ICD-10-CM

## 2021-02-11 DIAGNOSIS — F419 Anxiety disorder, unspecified: Secondary | ICD-10-CM | POA: Diagnosis not present

## 2021-02-11 DIAGNOSIS — Z87891 Personal history of nicotine dependence: Secondary | ICD-10-CM | POA: Diagnosis not present

## 2021-02-16 ENCOUNTER — Other Ambulatory Visit: Payer: Self-pay | Admitting: Internal Medicine

## 2021-02-16 NOTE — Telephone Encounter (Signed)
Requested medication (s) are due for refill today: NO  Requested medication (s) are on the active medication list: YES  Last refill:  01/23/21 #90/0 RF  Future visit scheduled: YES  Notes to clinic:  Unable to refill per protocol, cannot delegate.      Requested Prescriptions  Pending Prescriptions Disp Refills   clonazePAM (KLONOPIN) 0.5 MG tablet [Pharmacy Med Name: CLONAZEPAM 0.5 MG TAB] 90 tablet     Sig: TAKE 1 TABLET BY MOUTH 3 TIMES A DAY     Not Delegated - Psychiatry:  Anxiolytics/Hypnotics Failed - 02/16/2021 10:38 AM      Failed - This refill cannot be delegated      Failed - Urine Drug Screen completed in last 360 days      Passed - Valid encounter within last 6 months    Recent Outpatient Visits           1 month ago Medicare annual wellness visit, subsequent   [facility], Salvadore Oxford, NP   7 months ago Arthritis   [facility] [place], Salvadore Oxford, NP   11 months ago Depression with anxiety   [facility], Jodelle Gross, FNP   1 year ago Essential hypertension   [facility], Jodelle Gross, FNP   1 year ago Hospital discharge follow-up   [facility], Jodelle Gross, FNP       Future Appointments             In 4 months  [facility], [facility]   In 4 months [place], Salvadore Oxford, NP [facility], [facility]

## 2021-02-24 ENCOUNTER — Ambulatory Visit: Payer: Medicare HMO | Admitting: Licensed Clinical Social Worker

## 2021-02-24 ENCOUNTER — Telehealth: Payer: Self-pay

## 2021-02-24 DIAGNOSIS — I10 Essential (primary) hypertension: Secondary | ICD-10-CM

## 2021-02-24 DIAGNOSIS — F319 Bipolar disorder, unspecified: Secondary | ICD-10-CM

## 2021-02-24 DIAGNOSIS — F411 Generalized anxiety disorder: Secondary | ICD-10-CM

## 2021-02-24 NOTE — Chronic Care Management (AMB) (Signed)
°  Care Management   Note  02/24/2021 Name: Katrina Sutton MRN: [id number] DOB: 05/27/1950  Katrina Sutton is a 70 y.o. year old female who is a primary care patient of Lorre Munroe, NP and is actively engaged with the care management team. I reached out to Katrina Sutton by phone today to assist with re-scheduling a follow up visit with the RN Case Manager  Follow up plan: Unsuccessful telephone outreach attempt made. A HIPAA compliant phone message was left for the patient providing contact information and requesting a return call.  The care management team will reach out to the patient again over the next 7 days.  If patient returns call to provider office, please advise to call Embedded Care Management Care Guide Penne Lash  at [phone]  Penne Lash, RMA Care Guide, Embedded Care Coordination [facility]  [place], [place] [address] Direct Dial: [phone] [email or web address]: [email or web address]

## 2021-02-24 NOTE — Chronic Care Management (AMB) (Signed)
°  Care Management   Note  02/24/2021 Name: Denya TALAYAH PICARDI MRN: [id number] DOB: 30-Sep-1950  Karrissa T Delamar is a 70 y.o. year old female who is a primary care patient of Lorre Munroe, NP and is actively engaged with the care management team. I reached out to Eithel T Gignac by phone today to assist with re-scheduling a follow up visit with the RN Case Manager  Follow up plan: Telephone appointment with care management team member scheduled for:04/09/2021  Penne Lash, RMA Care Guide, Embedded Care Coordination [facility]  [place], [place] [address] Direct Dial: [phone] [email or web address]: [email or web address]

## 2021-02-25 NOTE — Chronic Care Management (AMB) (Signed)
Chronic Care Management    Clinical Social Work Note  02/25/2021 Name: Katrina Sutton MRN: [id number] DOB: 10/23/1950  Katrina Sutton is a 70 y.o. year old female who is a primary care patient of Lorre Munroe, NP. The CCM team was consulted to assist the patient with chronic disease management and/or care coordination needs related to: Mental Health Counseling and Resources.   Engaged with patient by telephone for follow up visit in response to provider referral for social work chronic care management and care coordination services.   Consent to Services:  The patient was given information about Chronic Care Management services, agreed to services, and gave verbal consent prior to initiation of services.  Please see initial visit note for detailed documentation.   Patient agreed to services and consent obtained.   Consent to Services:  The patient was given information about Care Management services, agreed to services, and gave verbal consent prior to initiation of services.  Please see initial visit note for detailed documentation.   Patient agreed to services today and consent obtained.  Engaged with patient by phone in response to provider referral for social work care coordination services:  Assessment/Interventions:  Patient continues to maintain positive progress with care plan goals. Patient has completed all goals with CCM LCSW .  See Care Plan below for interventions and patient self-care activities.  Recommendation: Patient may benefit from, and is in agreement to continue utilizing healthy coping skills and comply with med management to assist with mental health conditions.   Follow up Plan:  Patient does not require continued follow-up by CCM LCSW. Will contact the office if needed   SDOH (Social Determinants of Health) assessments and interventions performed:    Advanced Directives Status: Not addressed in this encounter.  CCM Care Plan  Allergies  Allergen Reactions    Erythromycin Shortness Of Breath and Rash   Duloxetine Palpitations    Outpatient Encounter Medications as of 02/24/2021  Medication Sig Note   acetaminophen (TYLENOL) 500 MG tablet Take 500 mg by mouth every 8 (eight) hours as needed for headache.    clonazePAM (KLONOPIN) 0.5 MG tablet TAKE 1 TABLET BY MOUTH 3 TIMES A DAY    hydrocortisone cream 1 % Apply 1 application topically 2 (two) times daily as needed for itching.  06/19/2019: Very seldom    pantoprazole (PROTONIX) 40 MG tablet Take 1 tablet (40 mg total) by mouth daily.    PARoxetine (PAXIL) 40 MG tablet Take 1 tablet (40 mg total) by mouth daily.    polyethylene glycol (MIRALAX / GLYCOLAX) 17 g packet Take 17 g by mouth daily.    No facility-administered encounter medications on file as of 02/24/2021.    Patient Active Problem List   Diagnosis Date Noted   Aortic atherosclerosis (HCC) 01/12/2021   Hypertension 08/30/2019   Constipation 04/17/2019   Bipolar 1 disorder (HCC) 08/23/2017   GERD (gastroesophageal reflux disease) 12/27/2016   Arthritis 12/27/2016   GAD (generalized anxiety disorder) 12/27/2016    Conditions to be addressed/monitored: Bipolar Disorder; Mental Health Concerns   Care Plan : General Social Work (Adult)  Updates made by Bridgett Larsson, LCSW since 02/25/2021 12:00 AM     Problem: Coping Skills (General Plan of Care)      Long-Range Goal: Coping Skills Enhanced Completed 02/24/2021  Start Date: 05/06/2020  This Visit's Progress: On track  Recent Progress: On track  Priority: Medium  Note:   Timeframe:  Long-Range Goal Priority:  Medium Start Date:  05/06/20                         Expected End Date:  03/14/21                     Follow Up Date -02/24/21   Current Barriers:  Limited social support Mental Health Concerns  Social Isolation Limited access to caregiver Lacks knowledge of community resource: available mental health resources that she can utilize  Clinical Social Work  Clinical Goal(s):  Over the next 120 days, client will work with SW to address concerns related to decreasing anxious symptoms by implementing additional mental health support and coping skills into her routine.  Interventions: Patient interviewed and appropriate assessments performed Assessed patient's previous and current treatment, coping skills, support system and barriers to care Patient reports symptoms of depression and anxiety associated with Bipolar Disorder had increased for a short while. Patient contributes this to medication adjustments by PCP 12/13: Patient reports great management of symptoms. States she has been coping with conditions since 1984 and knows what works well for her Patient shared anxiety symptoms are managed well. She denies current stressors Patient continues to pray and complete puzzles to promote relaxation and positive mood. She shared that she tries to stay busy and practice on focusing on self-care Patient continues to drive and take care of all household expenses independently Patient states that her son is doing well and continues to be a strong support system for her. Patient talks frequently with her best friend and neighbors Patient continues to read, pray, and complete devotions to cope with stressors. Patient is successfully completing her own errands and drinks a lot of water to remain healthy. She reports compliance with medication management Mindfulness or Relaxation Training, Active listening / Reflection utilized , Emotional Supportive Provided, and Psychoeducation for mental health needs  Discussed plans with patient for ongoing care management follow up and provided patient with direct contact information for care management team Collaboration with PCP regarding development and update of comprehensive plan of care as evidenced by provider attestation and co-signature Inter-disciplinary care team collaboration (see longitudinal plan of care) Patient  Self Care Activities:  Attend all scheduled provider appointments Continue utilizing healthy coping skills and support system Contact clinic with any questions or concerns       [name], MSW, LCSW [name] Care Management [place]   [facility] [email or web address] Phone [phone] 9:57 AM

## 2021-02-25 NOTE — Patient Instructions (Signed)
Visit Information  Thank you for taking time to visit with me today. Please don't hesitate to contact me if I can be of assistance to you before our next scheduled telephone appointment.  Following are the goals we discussed today:   Patient Self Care Activities:  Attend all scheduled provider appointments Continue utilizing healthy coping skills and support system Contact clinic with any questions or concerns  Please call the care guide team at [phone] if you need to cancel or reschedule your appointment.   If you are experiencing a Mental Health or Behavioral Health Crisis or need someone to talk to, please call the Suicide and Crisis Lifeline: 988 call 911   Patient verbalizes understanding of instructions provided today   No further follow up needed, as patient has completed goals with CCM LCSW  [name], MSW, LCSW [name] Care Management [facility]   [facility] [email or web address] Phone [phone] 10:00 AM

## 2021-03-02 ENCOUNTER — Telehealth: Payer: Medicare HMO

## 2021-03-17 ENCOUNTER — Other Ambulatory Visit: Payer: Self-pay | Admitting: Internal Medicine

## 2021-03-17 NOTE — Telephone Encounter (Signed)
Requested medication (s) are due for refill today: yes  Requested medication (s) are on the active medication list: yes  Last refill:  02/17/21 #90  Future visit scheduled: yes  Notes to clinic:  Please review for refill. Refill not delegated per protocol. To pharmacy: PATIENT ASKED [place] TO REQUEST A REFILL TO BE FILLED AT THE APPROPRIATE TIME. [place] YOU    Requested Prescriptions  Pending Prescriptions Disp Refills   clonazePAM (KLONOPIN) 0.5 MG tablet [Pharmacy Med Name: CLONAZEPAM 0.5 MG TAB] 90 tablet     Sig: TAKE 1 TABLET BY MOUTH 3 TIMES A DAY     Not Delegated - Psychiatry:  Anxiolytics/Hypnotics Failed - 03/17/2021 11:17 AM      Failed - This refill cannot be delegated      Failed - Urine Drug Screen completed in last 360 days      Passed - Valid encounter within last 6 months    Recent Outpatient Visits           2 months ago Medicare annual wellness visit, subsequent   [facility] [place], Coralie Keens, NP   8 months ago [facility], Coralie Keens, NP   1 year ago Depression with anxiety   [place], FNP   1 year ago Essential hypertension   [place], FNP   1 year ago Hospital discharge follow-up   [facility], Lupita Raider, FNP       Future Appointments             In 3 months  [facility] [place], [place]   In 3 months [place], Coralie Keens, NP [facility], [facility]

## 2021-03-23 ENCOUNTER — Ambulatory Visit: Payer: Self-pay | Admitting: *Deleted

## 2021-03-23 NOTE — Telephone Encounter (Signed)
Summary: heartburn acid reflux   Patient called says still experiencing some heartburn when taking protonix, wants to know wht else she can do or change medication. Please call back. [place], [place]  Phone: [phone]  Fax: [phone]        Chief Complaint: requesting increase in medication or new medication for acid reflux  Symptoms: burning sensation in "area of hernia" up to throat. Some belching  Frequency: 2-3 days , now constant  Pertinent Negatives: Patient denies chest pain, difficulty breathing Disposition: [] ED /[] Urgent Care (no appt availability in office) / [] Appointment(In office/virtual)/ []  [facility]/ [x] Home Care/ [] Refused Recommended Disposition /[] [place] [facility]/ []  Follow-up with PCP Additional Notes:  Please advise if appt needed or if patient can have change in medication or increase in medication for acid reflux.  Request call back from PCP in am .      Reason for Disposition  [1] Patient says chest pain feels exactly the same as previously diagnosed "heartburn" AND [2] describes burning in chest AND [3] accompanying sour taste in mouth    PCP call back with recommendations within 24 hours .  Answer Assessment - Initial Assessment Questions 1. LOCATION: "Where does it hurt?"       Where hernia located up to throat 2. RADIATION: "Does the pain go anywhere else?" (e.g., into neck, jaw, arms, back)     No  3. ONSET: "When did the chest pain begin?" (Minutes, hours or days)      2-3 days ago  4. PATTERN "Does the pain come and go, or has it been constant since it started?"  "Does it get worse with exertion?"      More constant now  5. DURATION: "How long does it last" (e.g., seconds, minutes, hours)     na 6. SEVERITY: "How bad is the pain?"  (e.g., Scale 1-10; mild, moderate, or severe)    - MILD (1-3): doesn't interfere with normal activities     - MODERATE (4-7): interferes with normal  activities or awakens from sleep    - SEVERE (8-10): excruciating pain, unable to do any normal activities       Burning sensation in area of "hernia' to throat  7. CARDIAC RISK FACTORS: "Do you have any history of heart problems or risk factors for heart disease?" (e.g., angina, prior heart attack; diabetes, high blood pressure, high cholesterol, smoker, or strong family history of heart disease)     na 8. PULMONARY RISK FACTORS: "Do you have any history of lung disease?"  (e.g., blood clots in lung, asthma, emphysema, birth control pills)     na 9. CAUSE: "What do you think is causing the chest pain?"     Acid reflux  10. OTHER SYMPTOMS: "Do you have any other symptoms?" (e.g., dizziness, nausea, vomiting, sweating, fever, difficulty breathing, cough)       Belching  11. PREGNANCY: "Is there any chance you are pregnant?" "When was your last menstrual period?"       na  Protocols used: Chest Pain-A-AH

## 2021-04-09 ENCOUNTER — Telehealth: Payer: Medicare HMO

## 2021-04-09 ENCOUNTER — Ambulatory Visit (INDEPENDENT_AMBULATORY_CARE_PROVIDER_SITE_OTHER): Payer: Medicare HMO

## 2021-04-09 ENCOUNTER — Telehealth: Payer: Self-pay

## 2021-04-09 DIAGNOSIS — F411 Generalized anxiety disorder: Secondary | ICD-10-CM

## 2021-04-09 DIAGNOSIS — F319 Bipolar disorder, unspecified: Secondary | ICD-10-CM

## 2021-04-09 DIAGNOSIS — F418 Other specified anxiety disorders: Secondary | ICD-10-CM

## 2021-04-09 DIAGNOSIS — I10 Essential (primary) hypertension: Secondary | ICD-10-CM

## 2021-04-09 DIAGNOSIS — K219 Gastro-esophageal reflux disease without esophagitis: Secondary | ICD-10-CM

## 2021-04-09 NOTE — Telephone Encounter (Signed)
°  Care Management   Follow Up Note   04/09/2021 Name: Jerusalen DELIANA FROGGE MRN: [id number] DOB: 1950/05/06   Referred by: Jearld Fenton, NP Reason for referral : Chronic Care Management (RNCM: Follow up for Chronic Disease Management and Care Coordination Needs)   The patient returned call and the [facility] called the patient back and the call was completed. See new encounter.   Follow Up Plan: Telephone follow up appointment with care management team member scheduled for: 05-28-2021 at [place] am  Noreene Larsson RN, MSN, [place] Mobile: [phone]

## 2021-04-09 NOTE — Patient Instructions (Signed)
Visit Information  Thank you for taking time to visit with me today. Please don't hesitate to contact me if I can be of assistance to you before our next scheduled telephone appointment.  Following are the goals we discussed today:  RNCM Clinical Goal(s):  Patient will verbalize understanding of plan for management of GERD, HTN, Anxiety, Depression, and Bipolar Disorder as evidenced by following the plan of care, seeing pcp on a regular basis, calling office for changes in conditions  take all medications exactly as prescribed and will call provider for medication related questions as evidenced by compliance with medications regimen and calling the office for changes or questions     demonstrate improved and ongoing health management independence as evidenced by normalized chronic conditions without exacerbations         demonstrate a decrease in GERD, HTN, Anxiety, Depression, and Bipolar Disorder exacerbations  as evidenced by working with the CCM team and pcp to optimize health and well being  demonstrate ongoing self health care management ability for effective management of chronic conditions  as evidenced by working with the CCM team through collaboration with Consulting civil engineer, provider, and care team.   Interventions: 1:1 collaboration with primary care provider regarding development and update of comprehensive plan of care as evidenced by provider attestation and co-signature Inter-disciplinary care team collaboration (see longitudinal plan of care) Evaluation of current treatment plan related to  self management and patient's adherence to plan as established by provider   SDOH Barriers (Status: Goal on Track (progressing): YES.) Long Term Goal  Patient interviewed and SDOH assessment performed        Patient interviewed and appropriate assessments performed Provided patient with information about resources available for help with SDOH and care guides to assist with any new needs or  concerns Discussed plans with patient for ongoing care management follow up and provided patient with direct contact information for care management team Advised patient to call the office for changes in SDOH, questions or concerns Reviewed with patient resources available to help with any needs that may arise for optimal management of health and well being    Anxiety, Depression, and Bipolar Disorder  (Status: Goal on Track (progressing): YES.) Long Term Goal  Evaluation of current treatment plan related to Anxiety, Depression, and Bipolar Disorder, Mental Health Concerns  self-management and patient's adherence to plan as established by provider. 04-09-2021: The patient is doing well. She has a lot of stressors but she works through it. She has learned to rest and make her health a priority. She states that it is helpful for her to work with the CCM team to talk out her anxieties. She recently had to get her drivers license renewed and she is thankful that she had "smooth sailing", with no "panic attacks". She is upbeat and positive today. She does get frustrated because her husband is gone a lot and does not want to help in the household and her son is diabetic and eats what he wants to eat. She has learned to let go of things she cannot control and focusing on her own health. Praised the patient for positive changes.  Discussed plans with patient for ongoing care management follow up and provided patient with direct contact information for care management team Advised patient to call the office for changes mood, anxiety, or depression ; Provided education to patient re: utilizing breathing exercises when she gets upset, read devoltionals, call friends when she is having a bad day, do things  that make her happy when she is down; Reviewed medications with patient and discussed compliance. 01-23-2021: The patient called the RNCM and states that she is going to run out of her Klonopin before the weekend  is out. She does not want to go through withdrawals. Secure chat with CMA and pcp and the patients refill has been called in today but cannot be refilled until Sunday. Instructed the patient of this. The patient states that she will not be able to pick the medication up until Monday as they are closed on Sunday. She states she will be okay. Reassurance given. The patient appreciated the RNCM talking to her and letting her know the pharmacy had been sent the refill request. Edication and support given. 04-09-2021: The patient has her medications and denies any acute findings ; Discussed plans with patient for ongoing care management follow up and provided patient with direct contact information for care management team; Advised patient to discuss concerns or changes in her thoughts, changes in her depression and anxiety levels with provider. 04-09-2021: Reflective listening with the patient today. She was able to express things that make her anxious and down. She is working through these issues with talking to a good friend, interaction with the CCM team, regular provider visits, doing her puzzles and devotions, and prayer.  She is open about her struggles and is thankful for the support she has; Screening for signs and symptoms of depression related to chronic disease state;  Assessed social determinant of health barriers;   Hypertension: (Status: Goal on Track (progressing): YES.) Last practice recorded BP readings:  BP Readings from Last 3 Encounters:  01/12/21 139/89  07/11/20 119/88  03/06/20 123/75   Most recent eGFR/CrCl: No results found for: EGFR  No components found for: CRCL  Evaluation of current treatment plan related to hypertension self management and patient's adherence to plan as established by provider. 04-09-2021: The patient states that she is doing well with management of her HTN. She denies any issues with her HTN or heart health;   Reviewed prescribed diet heart healthy. 04-09-2021:  The patient is compliant with heart healthy diet. The patient states that she watches what she eats and the Protonix is working better for her. She knows what foods cause her to have an exacerbation of her GERD. Reviewed medications with patient and discussed importance of compliance. 04-09-2021: The patient is compliant with her medications. Denies any new concerns;  Discussed plans with patient for ongoing care management follow up and provided patient with direct contact information for care management team; Advised patient, providing education and rationale, to monitor blood pressure daily and record, calling PCP for findings outside established parameters;  Provided education on prescribed diet heart healthy. 04-09-2021: Review of heart healthy diet and maintaining healthy eating habits;  Discussed complications of poorly controlled blood pressure such as heart disease, stroke, circulatory complications, vision complications, kidney impairment, sexual dysfunction;    GERD  (Status: Goal on Track (progressing): YES.) Long Term Goal  Evaluation of current treatment plan related to GERD,  self-management and patient's adherence to plan as established by provider. Discussed plans with patient for ongoing care management follow up and provided patient with direct contact information for care management team Advised patient to call the office for changes in GERD sx and sx, constipation, or diarrhea; Provided education to patient re: avoiding spicy foods, monitoring for foods that cause exacerbation of condition, sitting up when eating, not laying down at least 30 minutes after eating, and  monitoring bowel habits for changes. ; Reviewed medications with patient and discussed compliance. 04-09-2021: The patient states the Protonix is working better for her and she is doing well. She has her medications and takes as prescribed; Reviewed scheduled/upcoming provider appointments including 07-09-2021 at 120  pm; Discussed plans with patient for ongoing care management follow up and provided patient with direct contact information for care management team; Advised patient to discuss changes in bowel habits, exacerbations of GERD, and any questions and concerns related to GI health and well being with provider;   Patient Goals/Self-Care Activities: Patient will self administer medications as prescribed as evidenced by self report/primary caregiver report  Patient will attend all scheduled provider appointments as evidenced by clinician review of documented attendance to scheduled appointments and patient/caregiver report Patient will call pharmacy for medication refills as evidenced by patient report and review of pharmacy fill history as appropriate Patient will attend church or other social activities as evidenced by patient report Patient will continue to perform ADL's independently as evidenced by patient/caregiver report Patient will continue to perform IADL's independently as evidenced by patient/caregiver report Patient will call provider office for new concerns or questions as evidenced by review of documented incoming telephone call notes and patient report Patient will work with BSW to address care coordination needs and will continue to work with the clinical team to address health care and disease management related needs as evidenced by documented adherence to scheduled care management/care coordination appointments - check blood pressure weekly - choose a place to take my blood pressure (home, clinic or office, retail store) - write blood pressure results in a log or diary - learn about high blood pressure - keep a blood pressure log - take blood pressure log to all doctor appointments - call doctor for signs and symptoms of high blood pressure - develop an action plan for high blood pressure - keep all doctor appointments - take medications for blood pressure exactly as prescribed -  report new symptoms to your doctor - eat more whole grains, fruits and vegetables, lean meats and healthy fats Schedule eye exam. 04-09-2021: The patient states today that she has not had an eye exam in 2 years. Educated the patient on healthy practices and encouraged the patient to schedule an eye exam for evaluation of vision  Our next appointment is by telephone on 05-28-2021 at 1030 am   Please call the care guide team at [phone] if you need to cancel or reschedule your appointment.   If you are experiencing a Mental Health or [place] or need someone to talk to, please call the Suicide and Crisis Lifeline: 988 call the [place] National Suicide Prevention Lifeline: (314)560-0809 or TTY: (313) 411-5527 TTY (782)626-9404) to talk to a trained counselor call 1-800-273-TALK (toll free, 24 hour hotline)   The patient verbalized understanding of instructions, educational materials, and care plan provided today and declined offer to receive copy of patient instructions, educational materials, and care plan.   Noreene Larsson RN, MSN, [place] Mobile: [phone]

## 2021-04-09 NOTE — Chronic Care Management (AMB) (Signed)
Chronic Care Management   CCM RN Visit Note  04/09/2021 Name: Katrina Sutton MRN: [id number] DOB: 1950/07/15  Subjective: Katrina Sutton is a 71 y.o. year old female who is a primary care patient of Jearld Fenton, NP. The care management team was consulted for assistance with disease management and care coordination needs.    Engaged with patient by telephone for follow up visit in response to provider referral for case management and/or care coordination services.   Consent to Services:  The patient was given information about Chronic Care Management services, agreed to services, and gave verbal consent prior to initiation of services.  Please see initial visit note for detailed documentation.   Patient agreed to services and verbal consent obtained.   Assessment: Review of patient past medical history, allergies, medications, health status, including review of consultants reports, laboratory and other test data, was performed as part of comprehensive evaluation and provision of chronic care management services.   SDOH (Social Determinants of Health) assessments and interventions performed:    CCM Care Plan  Allergies  Allergen Reactions   Erythromycin Shortness Of Breath and Rash   Duloxetine Palpitations    Outpatient Encounter Medications as of 04/09/2021  Medication Sig Note   acetaminophen (TYLENOL) 500 MG tablet Take 500 mg by mouth every 8 (eight) hours as needed for headache.    clonazePAM (KLONOPIN) 0.5 MG tablet TAKE 1 TABLET BY MOUTH 3 TIMES A DAY    hydrocortisone cream 1 % Apply 1 application topically 2 (two) times daily as needed for itching.  06/19/2019: Very seldom    pantoprazole (PROTONIX) 40 MG tablet Take 1 tablet (40 mg total) by mouth daily.    PARoxetine (PAXIL) 40 MG tablet Take 1 tablet (40 mg total) by mouth daily.    polyethylene glycol (MIRALAX / GLYCOLAX) 17 g packet Take 17 g by mouth daily.    No facility-administered encounter medications on file as of  04/09/2021.    Patient Active Problem List   Diagnosis Date Noted   Aortic atherosclerosis ([place]) 01/12/2021   Hypertension 08/30/2019   Constipation 04/17/2019   Bipolar 1 disorder ([place]) 08/23/2017   GERD (gastroesophageal reflux disease) 12/27/2016   Arthritis 12/27/2016   GAD (generalized anxiety disorder) 12/27/2016    Conditions to be addressed/monitored:HTN, Anxiety, Depression, Bipolar Disorder, and GERD  Care Plan : RNCM: General Plan of Care (Adult) for Chronic Disease Management and Care Coordination Needs  Updates made by Vanita Ingles, RN since 04/09/2021 12:00 AM     Problem: RNCM: Development of Plan of Care for Chronic Disease Management (GERD, Anxiety, Depression, bipolar, and HTN)   Priority: High     Long-Range Goal: RNCM: Effective Management  of Plan of Care for Chronic Disease Management (GERD, Anxiety, Depression, bipolar, and HTN)   Start Date: 01/12/2021  Expected End Date: 01/12/2022  Priority: High  Note:   Current Barriers:  Knowledge Deficits related to plan of care for management of GERD, HTN and Anxiety with Excessive Worry, Panic Symptoms, Social Anxiety,, Depression: depressed mood anxiety panic attacks, Bipolar Disorder, and Mood Instability  Chronic Disease Management support and education needs related to GERD,  HTN and Anxiety with Excessive Worry, Panic Symptoms, Social Anxiety,, Depression: depressed mood anxiety panic attacks, and Bipolar Disorder Lacks caregiver support.   Assist in the care of adult child       RNCM Clinical Goal(s):  Patient will verbalize understanding of plan for management of GERD, HTN, Anxiety, Depression, and Bipolar Disorder  as evidenced by following the plan of care, seeing pcp on a regular basis, calling office for changes in conditions  take all medications exactly as prescribed and will call provider for medication related questions as evidenced by compliance with medications regimen and calling the office  for changes or questions     demonstrate improved and ongoing health management independence as evidenced by normalized chronic conditions without exacerbations         demonstrate a decrease in GERD, HTN, Anxiety, Depression, and Bipolar Disorder exacerbations  as evidenced by working with the CCM team and pcp to optimize health and well being  demonstrate ongoing self health care management ability for effective management of chronic conditions  as evidenced by working with the CCM team through collaboration with Consulting civil engineer, provider, and care team.   Interventions: 1:1 collaboration with primary care provider regarding development and update of comprehensive plan of care as evidenced by provider attestation and co-signature Inter-disciplinary care team collaboration (see longitudinal plan of care) Evaluation of current treatment plan related to  self management and patient's adherence to plan as established by provider   SDOH Barriers (Status: Goal on Track (progressing): YES.) Long Term Goal  Patient interviewed and SDOH assessment performed        Patient interviewed and appropriate assessments performed Provided patient with information about resources available for help with SDOH and care guides to assist with any new needs or concerns Discussed plans with patient for ongoing care management follow up and provided patient with direct contact information for care management team Advised patient to call the office for changes in SDOH, questions or concerns Reviewed with patient resources available to help with any needs that may arise for optimal management of health and well being    Anxiety, Depression, and Bipolar Disorder  (Status: Goal on Track (progressing): YES.) Long Term Goal  Evaluation of current treatment plan related to Anxiety, Depression, and Bipolar Disorder, Mental Health Concerns  self-management and patient's adherence to plan as established by provider. 04-09-2021:  The patient is doing well. She has a lot of stressors but she works through it. She has learned to rest and make her health a priority. She states that it is helpful for her to work with the CCM team to talk out her anxieties. She recently had to get her drivers license renewed and she is thankful that she had "smooth sailing", with no "panic attacks". She is upbeat and positive today. She does get frustrated because her husband is gone a lot and does not want to help in the household and her son is diabetic and eats what he wants to eat. She has learned to let go of things she cannot control and focusing on her own health. Praised the patient for positive changes.  Discussed plans with patient for ongoing care management follow up and provided patient with direct contact information for care management team Advised patient to call the office for changes mood, anxiety, or depression ; Provided education to patient re: utilizing breathing exercises when she gets upset, read devoltionals, call friends when she is having a bad day, do things that make her happy when she is down; Reviewed medications with patient and discussed compliance. 01-23-2021: The patient called the RNCM and states that she is going to run out of her Klonopin before the weekend is out. She does not want to go through withdrawals. Secure chat with CMA and pcp and the patients refill has been called in today  but cannot be refilled until Sunday. Instructed the patient of this. The patient states that she will not be able to pick the medication up until Monday as they are closed on Sunday. She states she will be okay. Reassurance given. The patient appreciated the RNCM talking to her and letting her know the pharmacy had been sent the refill request. Edication and support given. 04-09-2021: The patient has her medications and denies any acute findings ; Discussed plans with patient for ongoing care management follow up and provided patient with  direct contact information for care management team; Advised patient to discuss concerns or changes in her thoughts, changes in her depression and anxiety levels with provider. 04-09-2021: Reflective listening with the patient today. She was able to express things that make her anxious and down. She is working through these issues with talking to a good friend, interaction with the CCM team, regular provider visits, doing her puzzles and devotions, and prayer.  She is open about her struggles and is thankful for the support she has; Screening for signs and symptoms of depression related to chronic disease state;  Assessed social determinant of health barriers;   Hypertension: (Status: Goal on Track (progressing): YES.) Last practice recorded BP readings:  BP Readings from Last 3 Encounters:  01/12/21 139/89  07/11/20 119/88  03/06/20 123/75  Most recent eGFR/CrCl: No results found for: EGFR  No components found for: CRCL  Evaluation of current treatment plan related to hypertension self management and patient's adherence to plan as established by provider. 04-09-2021: The patient states that she is doing well with management of her HTN. She denies any issues with her HTN or heart health;   Reviewed prescribed diet heart healthy. 04-09-2021: The patient is compliant with heart healthy diet. The patient states that she watches what she eats and the Protonix is working better for her. She knows what foods cause her to have an exacerbation of her GERD. Reviewed medications with patient and discussed importance of compliance. 04-09-2021: The patient is compliant with her medications. Denies any new concerns;  Discussed plans with patient for ongoing care management follow up and provided patient with direct contact information for care management team; Advised patient, providing education and rationale, to monitor blood pressure daily and record, calling PCP for findings outside established parameters;   Provided education on prescribed diet heart healthy. 04-09-2021: Review of heart healthy diet and maintaining healthy eating habits;  Discussed complications of poorly controlled blood pressure such as heart disease, stroke, circulatory complications, vision complications, kidney impairment, sexual dysfunction;    GERD  (Status: Goal on Track (progressing): YES.) Long Term Goal  Evaluation of current treatment plan related to GERD,  self-management and patient's adherence to plan as established by provider. Discussed plans with patient for ongoing care management follow up and provided patient with direct contact information for care management team Advised patient to call the office for changes in GERD sx and sx, constipation, or diarrhea; Provided education to patient re: avoiding spicy foods, monitoring for foods that cause exacerbation of condition, sitting up when eating, not laying down at least 30 minutes after eating, and monitoring bowel habits for changes. ; Reviewed medications with patient and discussed compliance. 04-09-2021: The patient states the Protonix is working better for her and she is doing well. She has her medications and takes as prescribed; Reviewed scheduled/upcoming provider appointments including 07-09-2021 at 120 pm; Discussed plans with patient for ongoing care management follow up and provided patient with direct contact  information for care management team; Advised patient to discuss changes in bowel habits, exacerbations of GERD, and any questions and concerns related to GI health and well being with provider;   Patient Goals/Self-Care Activities: Patient will self administer medications as prescribed as evidenced by self report/primary caregiver report  Patient will attend all scheduled provider appointments as evidenced by clinician review of documented attendance to scheduled appointments and patient/caregiver report Patient will call pharmacy for medication  refills as evidenced by patient report and review of pharmacy fill history as appropriate Patient will attend church or other social activities as evidenced by patient report Patient will continue to perform ADL's independently as evidenced by patient/caregiver report Patient will continue to perform IADL's independently as evidenced by patient/caregiver report Patient will call provider office for new concerns or questions as evidenced by review of documented incoming telephone call notes and patient report Patient will work with BSW to address care coordination needs and will continue to work with the clinical team to address health care and disease management related needs as evidenced by documented adherence to scheduled care management/care coordination appointments - check blood pressure weekly - choose a place to take my blood pressure (home, clinic or office, retail store) - write blood pressure results in a log or diary - learn about high blood pressure - keep a blood pressure log - take blood pressure log to all doctor appointments - call doctor for signs and symptoms of high blood pressure - develop an action plan for high blood pressure - keep all doctor appointments - take medications for blood pressure exactly as prescribed - report new symptoms to your doctor - eat more whole grains, fruits and vegetables, lean meats and healthy fats Schedule eye exam. 04-09-2021: The patient states today that she has not had an eye exam in 2 years. Educated the patient on healthy practices and encouraged the patient to schedule an eye exam for evaluation of vision       Plan:Telephone follow up appointment with care management team member scheduled for:  05-28-2021 at 45 am  Raymond, MSN, [place] Mobile: [phone]

## 2021-04-14 DIAGNOSIS — F418 Other specified anxiety disorders: Secondary | ICD-10-CM

## 2021-04-14 DIAGNOSIS — I10 Essential (primary) hypertension: Secondary | ICD-10-CM

## 2021-04-15 ENCOUNTER — Other Ambulatory Visit: Payer: Self-pay | Admitting: Internal Medicine

## 2021-04-15 NOTE — Telephone Encounter (Signed)
Requested medication (s) are due for refill today: early request  Requested medication (s) are on the active medication list: yes  Last refill:  03/23/21  Future visit scheduled: 04/23/21  Notes to clinic:  This medication can not be delegated, please assess.        Requested Prescriptions  Pending Prescriptions Disp Refills   clonazePAM (KLONOPIN) 0.5 MG tablet [Pharmacy Med Name: CLONAZEPAM 0.5 MG TAB] 90 tablet     Sig: TAKE 1 TABLET BY MOUTH 3 TIMES A DAY     Not Delegated - Psychiatry: Anxiolytics/Hypnotics 2 Failed - 04/15/2021 11:22 AM      Failed - This refill cannot be delegated      Failed - Urine Drug Screen completed in last 360 days      Passed - Patient is not pregnant      Passed - Valid encounter within last 6 months    Recent Outpatient Visits           3 months ago Medicare annual wellness visit, subsequent   [facility], Salvadore Oxford, NP   9 months ago Arthritis   [facility], Salvadore Oxford, NP   1 year ago Depression with anxiety   [facility], Jodelle Gross, FNP   1 year ago Essential hypertension   [facility], Jodelle Gross, FNP   1 year ago Hospital discharge follow-up   [facility], Jodelle Gross, FNP       Future Appointments             In 1 week Baity, Salvadore Oxford, NP [facility], [facility]   In 2 months  [facility], [facility]   In 2 months Steele, Salvadore Oxford, NP [facility], [facility]

## 2021-04-23 ENCOUNTER — Ambulatory Visit: Payer: Medicare HMO | Admitting: Internal Medicine

## 2021-05-11 ENCOUNTER — Other Ambulatory Visit: Payer: Self-pay | Admitting: Internal Medicine

## 2021-05-11 DIAGNOSIS — F419 Anxiety disorder, unspecified: Secondary | ICD-10-CM

## 2021-05-12 ENCOUNTER — Other Ambulatory Visit: Payer: Self-pay

## 2021-05-12 DIAGNOSIS — F419 Anxiety disorder, unspecified: Secondary | ICD-10-CM

## 2021-05-12 MED ORDER — PAROXETINE HCL 40 MG PO TABS: 40.0000 mg | ORAL_TABLET | Freq: Every day | ORAL | 1 refills | Status: DC

## 2021-05-12 NOTE — Telephone Encounter (Signed)
Duplicate request Requested Prescriptions  Pending Prescriptions Disp Refills   PARoxetine (PAXIL) 40 MG tablet [Pharmacy Med Name: PAROXETINE HCL 40 MG TAB] 90 tablet 1    Sig: TAKE 1 TABLET BY MOUTH DAILY     Psychiatry:  Antidepressants - SSRI Passed - 05/11/2021 11:46 AM      Passed - Valid encounter within last 6 months    Recent Outpatient Visits          4 months ago Medicare annual wellness visit, subsequent   [facility], Salvadore Oxford, NP   10 months ago Arthritis   [facility] [place], Salvadore Oxford, NP   1 year ago Depression with anxiety   [facility], Jodelle Gross, FNP   1 year ago Essential hypertension   [facility], Jodelle Gross, FNP   1 year ago Hospital discharge follow-up   [facility], Jodelle Gross, FNP      Future Appointments            In 1 month  [facility], [facility]   In 1 month Clayton, Salvadore Oxford, NP [facility], [facility]

## 2021-05-13 ENCOUNTER — Other Ambulatory Visit: Payer: Self-pay | Admitting: Internal Medicine

## 2021-05-13 NOTE — Telephone Encounter (Signed)
Requested medication (s) are due for refill today:   Provider to review ? ?Requested medication (s) are on the active medication list:   Yes ? ?Future visit scheduled:   Yes ? ? ?Last ordered: 04/16/2021 #90, 0 refills ? ?Returned because it's a non delegated refill.     ? ?Requested Prescriptions  ?Pending Prescriptions Disp Refills  ? clonazePAM (KLONOPIN) 0.5 MG tablet [Pharmacy Med Name: CLONAZEPAM 0.5 MG TAB] 90 tablet   ?  Sig: TAKE 1 TABLET BY MOUTH 3 TIMES A DAY  ?  ? Not Delegated - Psychiatry: Anxiolytics/Hypnotics 2 Failed - 05/13/2021 11:42 AM  ?  ?  Failed - This refill cannot be delegated  ?  ?  Failed - Urine Drug Screen completed in last 360 days  ?  ?  Passed - Patient is not pregnant  ?  ?  Passed - Valid encounter within last 6 months  ?  Recent Outpatient Visits   ? ?      ? 4 months ago Medicare annual wellness visit, subsequent  ? [facility], Salvadore Oxford, NP  ? 10 months ago Arthritis  ? [facility] [place], Salvadore Oxford, NP  ? 1 year ago Depression with anxiety  ? [facility], Jodelle Gross, FNP  ? 1 year ago Essential hypertension  ? [facility], Jodelle Gross, FNP  ? 1 year ago Hospital discharge follow-up  ? [facility], Jodelle Gross, FNP  ? ?  ?  ?Future Appointments   ? ?        ? In 1 month  [facility], [place]  ? In 1 month Baity, Salvadore Oxford, NP [facility], [facility]  ? ?  ? ?  ?  ?  ? ?

## 2021-05-28 ENCOUNTER — Telehealth: Payer: Medicare HMO

## 2021-05-28 ENCOUNTER — Ambulatory Visit (INDEPENDENT_AMBULATORY_CARE_PROVIDER_SITE_OTHER): Payer: Medicare HMO

## 2021-05-28 ENCOUNTER — Telehealth: Payer: Self-pay

## 2021-05-28 NOTE — Patient Instructions (Signed)
Visit Information ? ?Thank you for taking time to visit with me today. Please don't hesitate to contact me if I can be of assistance to you before our next scheduled telephone appointment. ? ?Following are the goals we discussed today:  ?RNCM Clinical Goal(s):  ?Patient will verbalize understanding of plan for management of GERD, HTN, Anxiety, Depression, and Bipolar Disorder as evidenced by following the plan of care, seeing pcp on a regular basis, calling office for changes in conditions  ?take all medications exactly as prescribed and will call provider for medication related questions as evidenced by compliance with medications regimen and calling the office for changes or questions     ?demonstrate improved and ongoing health management independence as evidenced by normalized chronic conditions without exacerbations         ?demonstrate a decrease in GERD, HTN, Anxiety, Depression, and Bipolar Disorder exacerbations  as evidenced by working with the CCM team and pcp to optimize health and well being  ?demonstrate ongoing self health care management ability for effective management of chronic conditions  as evidenced by working with the CCM team through collaboration with Consulting civil engineer, provider, and care team.  ?  ?Interventions: ?1:1 collaboration with primary care provider regarding development and update of comprehensive plan of care as evidenced by provider attestation and co-signature ?Inter-disciplinary care team collaboration (see longitudinal plan of care) ?Evaluation of current treatment plan related to  self management and patient's adherence to plan as established by provider ?  ?  ?SDOH Barriers (Status: Goal on Track (progressing): YES.) Long Term Goal  ?Patient interviewed and SDOH assessment performed ?       ?Patient interviewed and appropriate assessments performed ?Provided patient with information about resources available for help with SDOH and care guides to assist with any new needs or  concerns ?Discussed plans with patient for ongoing care management follow up and provided patient with direct contact information for care management team ?Advised patient to call the office for changes in SDOH, questions or concerns ?Reviewed with patient resources available to help with any needs that may arise for optimal management of health and well being ?  ?  ?  ?Anxiety, Depression, and Bipolar Disorder  (Status: Goal on Track (progressing): YES.) Long Term Goal  ?Evaluation of current treatment plan related to Anxiety, Depression, and Bipolar Disorder, Mental Health Concerns  self-management and patient's adherence to plan as established by provider. 04-09-2021: The patient is doing well. She has a lot of stressors but she works through it. She has learned to rest and make her health a priority. She states that it is helpful for her to work with the CCM team to talk out her anxieties. She recently had to get her drivers license renewed and she is thankful that she had "smooth sailing", with no "panic attacks". She is upbeat and positive today. She does get frustrated because her husband is gone a lot and does not want to help in the household and her son is diabetic and eats what he wants to eat. She has learned to let go of things she cannot control and focusing on her own health. Praised the patient for positive changes. 05-28-2021: The patient is doing well and states she is sticking to her routine and managing well. She denies any acute distress. States she will follow up with the pcp in April.  ?Discussed plans with patient for ongoing care management follow up and provided patient with direct contact information for care management team ?  Advised patient to call the office for changes mood, anxiety, or depression ; ?Provided education to patient re: utilizing breathing exercises when she gets upset, read devoltionals, call friends when she is having a bad day, do things that make her happy when she is  down; ?Reviewed medications with patient and discussed compliance. 01-23-2021: The patient called the RNCM and states that she is going to run out of her Klonopin before the weekend is out. She does not want to go through withdrawals. Secure chat with CMA and pcp and the patients refill has been called in today but cannot be refilled until Sunday. Instructed the patient of this. The patient states that she will not be able to pick the medication up until Monday as they are closed on Sunday. She states she will be okay. Reassurance given. The patient appreciated the RNCM talking to her and letting her know the pharmacy had been sent the refill request. Edication and support given. 05-28-2021: The patient has her medications and denies any acute findings ; ?Discussed plans with patient for ongoing care management follow up and provided patient with direct contact information for care management team; ?Advised patient to discuss concerns or changes in her thoughts, changes in her depression and anxiety levels with provider. 04-09-2021: Reflective listening with the patient today. She was able to express things that make her anxious and down. She is working through these issues with talking to a good friend, interaction with the CCM team, regular provider visits, doing her puzzles and devotions, and prayer.  She is open about her struggles and is thankful for the support she has. 05-28-2021: The patient is stable and denies any acute changes with her anxiety, depression or bipolar. The patient states that she is active and stays busy during the day. She is a "night owl" and likes to stay up a little later than usual. Education and support given. ; ?Screening for signs and symptoms of depression related to chronic disease state;  ?Assessed social determinant of health barriers;  ?  ?Hypertension: (Status: Goal on Track (progressing): YES.) ?Last practice recorded BP readings:  ?   ?BP Readings from Last 3 Encounters:   ?01/12/21 139/89  ?07/11/20 119/88  ?03/06/20 123/75  ?Most recent eGFR/CrCl: No results found for: EGFR  No components found for: CRCL ?  ?Evaluation of current treatment plan related to hypertension self management and patient's adherence to plan as established by provider. 05-28-2021: The patient states that she is doing well with management of her HTN. She denies any issues with her HTN or heart health;   ?Reviewed prescribed diet heart healthy. 05-28-2021: The patient is compliant with heart healthy diet. The patient states that she watches what she eats and the Protonix is working better for her. She knows what foods cause her to have an exacerbation of her GERD. ?Reviewed medications with patient and discussed importance of compliance. 05-28-2021: The patient is compliant with her medications. Denies any new concerns;  ?Discussed plans with patient for ongoing care management follow up and provided patient with direct contact information for care management team; ?Advised patient, providing education and rationale, to monitor blood pressure daily and record, calling PCP for findings outside established parameters;  ?Provided education on prescribed diet heart healthy. 05-28-2021: Review of heart healthy diet and maintaining healthy eating habits;  ?Discussed complications of poorly controlled blood pressure such as heart disease, stroke, circulatory complications, vision complications, kidney impairment, sexual dysfunction;  ?  ?  ?GERD  (Status: Goal on  Track (progressing): YES.) Long Term Goal  ?Evaluation of current treatment plan related to GERD,  self-management and patient's adherence to plan as established by provider. 05-28-2021: The patient is stable and denies any acute distress today. She states her medications are working well for her and she is not having any exacerbations of her GERD. ?Discussed plans with patient for ongoing care management follow up and provided patient with direct contact  information for care management team ?Advised patient to call the office for changes in GERD sx and sx, constipation, or diarrhea; ?Provided education to patient re: avoiding spicy foods, monitoring for foods that ca

## 2021-05-28 NOTE — Telephone Encounter (Signed)
?  Care Management  ? ?Follow Up Note ? ? ?05/28/2021 ?Name: Sherian ALPHONSINE MINIUM MRN: [id number] DOB: 1950/08/08 ? ? ?Referred by: Lorre Munroe, NP ?Reason for referral : Chronic Care Management (RNCM: Follow up for Chronic Disease Management and Care Coordination Needs ) ? ? ?Call made back to the patient and call completed. See new encounter. ? ?Follow Up Plan: Telephone follow up appointment with care management team member scheduled for: 07-30-2021 at 1030 pm ? ?Alto Denver RN, MSN, CCM ?Community Care Coordinator ?[place]  [facility] ?[facility] ?Mobile: [phone]  ?

## 2021-05-28 NOTE — Chronic Care Management (AMB) (Signed)
?Chronic Care Management  ? ?CCM RN Visit Note ? ?05/28/2021 ?Name: Katrina Sutton MRN: [id number] DOB: Apr 30, 1950 ? ?Subjective: ?Katrina Sutton is a 71 y.o. year old female who is a primary care patient of Lorre Munroe, NP. The care management team was consulted for assistance with disease management and care coordination needs.   ? ?Engaged with patient by telephone for follow up visit in response to provider referral for case management and/or care coordination services.  ? ?Consent to Services:  ?The patient was given information about Chronic Care Management services, agreed to services, and gave verbal consent prior to initiation of services.  Please see initial visit note for detailed documentation.  ? ?Patient agreed to services and verbal consent obtained.  ? ?Assessment: Review of patient past medical history, allergies, medications, health status, including review of consultants reports, laboratory and other test data, was performed as part of comprehensive evaluation and provision of chronic care management services.  ? ?SDOH (Social Determinants of Health) assessments and interventions performed:   ? ?CCM Care Plan ? ?Allergies  ?Allergen Reactions  ? Erythromycin Shortness Of Breath and Rash  ? Duloxetine Palpitations  ? ? ?Outpatient Encounter Medications as of 05/28/2021  ?Medication Sig Note  ? acetaminophen (TYLENOL) 500 MG tablet Take 500 mg by mouth every 8 (eight) hours as needed for headache.   ? clonazePAM (KLONOPIN) 0.5 MG tablet TAKE 1 TABLET BY MOUTH 3 TIMES A DAY   ? hydrocortisone cream 1 % Apply 1 application topically 2 (two) times daily as needed for itching.  06/19/2019: Very seldom   ? pantoprazole (PROTONIX) 40 MG tablet Take 1 tablet (40 mg total) by mouth daily.   ? PARoxetine (PAXIL) 40 MG tablet Take 1 tablet (40 mg total) by mouth daily.   ? polyethylene glycol (MIRALAX / GLYCOLAX) 17 g packet Take 17 g by mouth daily.   ? ?No facility-administered encounter medications on file as of  05/28/2021.  ? ? ?Patient Active Problem List  ? Diagnosis Date Noted  ? Aortic atherosclerosis (HCC) 01/12/2021  ? Hypertension 08/30/2019  ? Constipation 04/17/2019  ? Bipolar 1 disorder (HCC) 08/23/2017  ? GERD (gastroesophageal reflux disease) 12/27/2016  ? Arthritis 12/27/2016  ? GAD (generalized anxiety disorder) 12/27/2016  ? ? ?Conditions to be addressed/monitored:HTN, Anxiety, Depression, Bipolar Disorder, and GERD ? ?Care Plan : RNCM: General Plan of Care (Adult) for Chronic Disease Management and Care Coordination Needs  ?Updates made by Marlowe Sax, RN since 05/28/2021 12:00 AM  ?  ? ?Problem: RNCM: Development of Plan of Care for Chronic Disease Management (GERD, Anxiety, Depression, bipolar, and HTN)   ?Priority: High  ?  ? ?Long-Range Goal: RNCM: Effective Management  of Plan of Care for Chronic Disease Management (GERD, Anxiety, Depression, bipolar, and HTN)   ?Start Date: 01/12/2021  ?Expected End Date: 01/12/2022  ?Priority: High  ?Note:   ?Current Barriers:  ?Knowledge Deficits related to plan of care for management of GERD, HTN and Anxiety with Excessive Worry, ?Panic Symptoms, ?Social Anxiety,, Depression: depressed mood ?anxiety ?panic attacks, Bipolar Disorder, and Mood Instability  ?Chronic Disease Management support and education needs related to GERD,  HTN and Anxiety with Excessive Worry, ?Panic Symptoms, ?Social Anxiety,, Depression: depressed mood ?anxiety ?panic attacks, and Bipolar Disorder ?Lacks caregiver support.   ?Assist in the care of adult child      ? ?RNCM Clinical Goal(s):  ?Patient will verbalize understanding of plan for management of GERD, HTN, Anxiety, Depression, and Bipolar Disorder  as evidenced by following the plan of care, seeing pcp on a regular basis, calling office for changes in conditions  ?take all medications exactly as prescribed and will call provider for medication related questions as evidenced by compliance with medications regimen and calling the office  for changes or questions     ?demonstrate improved and ongoing health management independence as evidenced by normalized chronic conditions without exacerbations         ?demonstrate a decrease in GERD, HTN, Anxiety, Depression, and Bipolar Disorder exacerbations  as evidenced by working with the CCM team and pcp to optimize health and well being  ?demonstrate ongoing self health care management ability for effective management of chronic conditions  as evidenced by working with the CCM team through collaboration with Medical illustratorN Care manager, provider, and care team.  ? ?Interventions: ?1:1 collaboration with primary care provider regarding development and update of comprehensive plan of care as evidenced by provider attestation and co-signature ?Inter-disciplinary care team collaboration (see longitudinal plan of care) ?Evaluation of current treatment plan related to  self management and patient's adherence to plan as established by provider ? ? ?SDOH Barriers (Status: Goal on Track (progressing): YES.) Long Term Goal  ?Patient interviewed and SDOH assessment performed ?       ?Patient interviewed and appropriate assessments performed ?Provided patient with information about resources available for help with SDOH and care guides to assist with any new needs or concerns ?Discussed plans with patient for ongoing care management follow up and provided patient with direct contact information for care management team ?Advised patient to call the office for changes in SDOH, questions or concerns ?Reviewed with patient resources available to help with any needs that may arise for optimal management of health and well being ? ? ? ?Anxiety, Depression, and Bipolar Disorder  (Status: Goal on Track (progressing): YES.) Long Term Goal  ?Evaluation of current treatment plan related to Anxiety, Depression, and Bipolar Disorder, Mental Health Concerns  self-management and patient's adherence to plan as established by provider. 04-09-2021:  The patient is doing well. She has a lot of stressors but she works through it. She has learned to rest and make her health a priority. She states that it is helpful for her to work with the CCM team to talk out her anxieties. She recently had to get her drivers license renewed and she is thankful that she had "smooth sailing", with no "panic attacks". She is upbeat and positive today. She does get frustrated because her husband is gone a lot and does not want to help in the household and her son is diabetic and eats what he wants to eat. She has learned to let go of things she cannot control and focusing on her own health. Praised the patient for positive changes. 05-28-2021: The patient is doing well and states she is sticking to her routine and managing well. She denies any acute distress. States she will follow up with the pcp in April.  ?Discussed plans with patient for ongoing care management follow up and provided patient with direct contact information for care management team ?Advised patient to call the office for changes mood, anxiety, or depression ; ?Provided education to patient re: utilizing breathing exercises when she gets upset, read devoltionals, call friends when she is having a bad day, do things that make her happy when she is down; ?Reviewed medications with patient and discussed compliance. 01-23-2021: [facility] and states that she is going to run  out of her Klonopin before the weekend is out. She does not want to go through withdrawals. Secure chat with CMA and pcp and the patients refill has been called in today but cannot be refilled until Sunday. Instructed the patient of this. The patient states that she will not be able to pick the medication up until Monday as they are closed on Sunday. She states she will be okay. Reassurance given. The patient appreciated the RNCM talking to her and letting her know the pharmacy had been sent the refill request. Edication and support  given. 05-28-2021: The patient has her medications and denies any acute findings ; ?Discussed plans with patient for ongoing care management follow up and provided patient with direct contact infor

## 2021-06-12 ENCOUNTER — Other Ambulatory Visit: Payer: Self-pay | Admitting: Internal Medicine

## 2021-06-12 DIAGNOSIS — F418 Other specified anxiety disorders: Secondary | ICD-10-CM

## 2021-06-12 DIAGNOSIS — F3289 Other specified depressive episodes: Secondary | ICD-10-CM

## 2021-06-12 DIAGNOSIS — I10 Essential (primary) hypertension: Secondary | ICD-10-CM

## 2021-06-12 NOTE — Telephone Encounter (Signed)
Requested medications are due for refill today.  yes ? ?Requested medications are on the active medications list.  yes ? ?Last refill. 05/14/2021 #90 0 refills ? ?Future visit scheduled.   yes ? ?Notes to clinic.  Medication refill is not delegated. ? ? ? ?Requested Prescriptions  ?Pending Prescriptions Disp Refills  ? clonazePAM (KLONOPIN) 0.5 MG tablet [Pharmacy Med Name: CLONAZEPAM 0.5 MG TAB] 90 tablet   ?  Sig: TAKE 1 TABLET BY MOUTH 3 TIMES A DAY  ?  ? Not Delegated - Psychiatry: Anxiolytics/Hypnotics 2 Failed - 06/12/2021 11:00 AM  ?  ?  Failed - This refill cannot be delegated  ?  ?  Failed - Urine Drug Screen completed in last 360 days  ?  ?  Passed - Patient is not pregnant  ?  ?  Passed - Valid encounter within last 6 months  ?  Recent Outpatient Visits   ? ?      ? 5 months ago Medicare annual wellness visit, subsequent  ? [facility], Salvadore Oxford, NP  ? 11 months ago Arthritis  ? [facility], Salvadore Oxford, NP  ? 1 year ago Depression with anxiety  ? [facility], Jodelle Gross, FNP  ? 1 year ago Essential hypertension  ? [facility], Jodelle Gross, FNP  ? 1 year ago Hospital discharge follow-up  ? [facility], Jodelle Gross, FNP  ? ?  ?  ?Future Appointments   ? ?        ? In 2 weeks  [facility], [place]  ? In 3 weeks [place], Salvadore Oxford, NP [facility]  ? ?  ? ?  ?  ?  ?  ?

## 2021-06-16 ENCOUNTER — Ambulatory Visit: Payer: Self-pay | Admitting: *Deleted

## 2021-06-16 NOTE — Telephone Encounter (Signed)
FYI

## 2021-06-16 NOTE — Telephone Encounter (Signed)
Per agent:  ?"Pt called and was injured by a rusty object, wants advice. Please advise  ?Best contact: [phone]" ? ? ? ?Chief Complaint: finger wound ?Symptoms: Small scratch "Pencil lead tip" Bled a little while, no further bleeding. "Flap of skin, I put the flap back." ?Frequency: 2 hours ago ?Pertinent Negatives: Patient denies bleeding, swelling, pain, drainage ?Disposition: [] ED /[] Urgent Care (no appt availability in office) / [] Appointment(In office/virtual)/ []  [facility]/  Yes Home Care/ [] Refused Recommended Disposition /[] [facility]/ []  Follow-up with PCP ?Additional Notes: Pt states she immediately washed wound, applied ATB ointment and Band-Aid. Home care advise given per protocol. Pt verbalizes understanding. Advised to CB any signs of infection. Pt is up to date on tetanus. ? ? ?Reason for Disposition ? Small cut (scratch) or abrasion (scrape) is also present ? ?Answer Assessment - Initial Assessment Questions ?1. MECHANISM: "How did the injury happen?"  ?     ?2. ONSET: "When did the injury happen?" (Minutes or hours ago)  ?    2 hours ago ?3. LOCATION: "What part of the finger is injured?" "Is the nail damaged?"  ?    Small finger right hand ?4. APPEARANCE of the INJURY: "What does the injury look like?"  ?     ?5. SEVERITY: "Can you use the hand normally?"  "Can you bend your fingers into a ball and then fully open them?" ?    yes ?6. SIZE: For cuts, bruises, or swelling, ask: "How large is it?" (e.g., inches or centimeters;  entire finger)  ?    As big as a tip of lead pencil, flap of skin ?7. PAIN: "Is there pain?" If Yes, ask: "How bad is the pain?"    (e.g., Scale 1-10; or mild, moderate, severe) ? - NONE (0): no pain. ? - MILD (1-3): doesn't interfere with normal activities.  ? - MODERATE (4-7): interferes with normal activities or awakens from sleep. ? - SEVERE (8-10): excruciating pain, unable to hold a glass of water or bend finger even a little. ?    None ?8.  TETANUS: For any breaks in the skin, ask: "When was the last tetanus booster?" ?    2017 ?9. OTHER SYMPTOMS: "Do you have any other symptoms?" ?    no ? ?Protocols used: Finger Injury-A-AH ? ?

## 2021-06-16 NOTE — Telephone Encounter (Signed)
Clean daily with warm water and soap.  Apply Neosporin and cover with a Band-Aid.  Take Band-Aid off at night to dry out.  Tetanus was in 2017 ?

## 2021-06-30 ENCOUNTER — Ambulatory Visit: Payer: Medicare HMO

## 2021-07-02 ENCOUNTER — Ambulatory Visit (INDEPENDENT_AMBULATORY_CARE_PROVIDER_SITE_OTHER): Payer: Medicare HMO

## 2021-07-02 VITALS — BP 142/70 | HR 69 | Temp 97.6°F | Resp 17 | Ht 64.5 in | Wt 152.2 lb

## 2021-07-02 DIAGNOSIS — Z0000 Encounter for general adult medical examination without abnormal findings: Secondary | ICD-10-CM | POA: Diagnosis not present

## 2021-07-02 NOTE — Patient Instructions (Signed)

## 2021-07-02 NOTE — Progress Notes (Signed)
? ?Subjective:  ? Katrina Sutton is a 71 y.o. female who presents for Medicare Annual (Subsequent) preventive examination. ? ?Review of Systems    ?Per HPI unless specifically indicated below   ?Cardiac Risk Factors include: advanced age (>12men, >65 women);dyslipidemia;sedentary lifestyle ? ?   ?Objective:  ?  ?Today's Vitals  ? 07/02/21 1419  ?BP: (!) 142/76  ?Pulse: 69  ?Resp: 17  ?Temp: 97.6 ?F (36.4 ?C)  ?TempSrc: Oral  ?SpO2: 97%  ?Weight: 152 lb 3.2 oz (69 kg)  ?PainSc: 1   ? ?Body mass index is 24.57 kg/m?. ? ? ?  07/02/2021  ?  2:20 PM 06/24/2020  ?  2:59 PM 10/27/2019  ? 12:40 AM 10/26/2019  ?  9:23 PM 10/12/2019  ?  6:13 PM 10/11/2019  ?  5:24 PM 06/19/2019  ?  2:43 PM  ?Advanced Directives  ?Does Patient Have a Medical Advance Directive? No No No No  No No  ?Would patient like information on creating a medical advance directive? No - Patient declined  No - Patient declined No - Patient declined No - Patient declined    ? ? ?Current Medications (verified) ?Outpatient Encounter Medications as of 07/02/2021  ?Medication Sig  ? acetaminophen (TYLENOL) 500 MG tablet Take 500 mg by mouth every 8 (eight) hours as needed for headache.  ? clonazePAM (KLONOPIN) 0.5 MG tablet TAKE 1 TABLET BY MOUTH 3 TIMES A DAY  ? hydrocortisone cream 1 % Apply 1 application topically 2 (two) times daily as needed for itching.   ? pantoprazole (PROTONIX) 40 MG tablet Take 1 tablet (40 mg total) by mouth daily.  ? PARoxetine (PAXIL) 40 MG tablet Take 1 tablet (40 mg total) by mouth daily.  ? polyethylene glycol (MIRALAX / GLYCOLAX) 17 g packet Take 17 g by mouth daily.  ? ?No facility-administered encounter medications on file as of 07/02/2021.  ? ? ?Allergies (verified) ?Erythromycin and Duloxetine  ? ?History: ?Past Medical History:  ?Diagnosis Date  ? Allergy   ? seasonal  ? Anxiety   ? Arthritis   ? osteoarthritis  ? Depression   ? GERD (gastroesophageal reflux disease)   ? Hiatal hernia   ? ?Past Surgical History:  ?Procedure Laterality  Date  ? CHOLECYSTECTOMY N/A 05/31/2018  ? Procedure: LAPAROSCOPIC CHOLECYSTECTOMY;  Surgeon: Fredirick Maudlin, MD;  Location: [facility] ORS;  Service: General;  Laterality: N/A;  ? TONSILLECTOMY    ? UMBILICAL HERNIA REPAIR N/A 05/31/2018  ? Procedure: HERNIA REPAIR UMBILICAL ADULT;  Surgeon: Fredirick Maudlin, MD;  Location: [facility] OR[facility];  Service: General;  Laterality: N/A;  ? UMBILICAL HERNIA REPAIR N/A 10/31/2019  ? Procedure: HERNIA REPAIR UMBILICAL ADULT;  Surgeon: Fredirick Maudlin, MD;  Location: [facility] OR[facility];  Service: General;  Laterality: N/A;  ? WRIST SURGERY Left   ? ?Family History  ?Problem Relation Age of Onset  ? Mental illness Mother   ? Ulcers Father   ? Stroke Neg Hx   ? Heart attack Neg Hx   ? Cancer Neg Hx   ? ?Social History  ? ?Socioeconomic History  ? Marital status: Married  ?  Spouse name: Arleene Menting  ? Number of children: 1  ? Years of education: Not on file  ? Highest education level: Not on file  ?Occupational History  ? Occupation: retired  ?Tobacco Use  ? Smoking status: Former  ?  Types: Cigarettes  ?  Quit date: 12/27/1976  ?  Years since quitting: 44.5  ? Smokeless tobacco: Never  ?Vaping  Use  ? Vaping Use: Never used  ?Substance and Sexual Activity  ? Alcohol use: No  ? Drug use: No  ? Sexual activity: Not Currently  ?  Birth control/protection: None  ?Other Topics Concern  ? Not on file  ?Social History Narrative  ? Not on file  ? ?Social Determinants of Health  ? ?Financial Resource Strain: Low Risk   ? Difficulty of Paying Living Expenses: Not hard at all  ?Food Insecurity: No Food Insecurity  ? Worried About Programme researcher, broadcasting/film/videounning Out of Food in the Last Year: Never true  ? Ran Out of Food in the Last Year: Never true  ?Transportation Needs: No Transportation Needs  ? Lack of Transportation (Medical): No  ? Lack of Transportation (Non-Medical): No  ?Physical Activity: Inactive  ? Days of Exercise per Week: 0 days  ? Minutes of Exercise per Session: 0 min  ?Stress: No Stress Concern Present  ? Feeling of  Stress : Only a little  ?Social Connections: Moderately Integrated  ? Frequency of Communication with Friends and Family: More than three times a week  ? Frequency of Social Gatherings with Friends and Family: More than three times a week  ? Attends Religious Services: 1 to 4 times per year  ? Active Member of Clubs or Organizations: No  ? Attends BankerClub or Organization Meetings: Never  ? Marital Status: Married  ? ? ?Tobacco Counseling ?Counseling given: Not Answered ? ? ?Clinical Intake: ? ?Pre-visit preparation completed: No ? ?Pain : 0-10 ?Pain Score: 1  ?Pain Type: Chronic pain ?Pain Location: Knee ?Pain Orientation: Right ?Pain Descriptors / Indicators: Aching ?Pain Onset: More than a month ago ?Pain Frequency: Occasional ? ?  ? ?Nutritional Status: BMI 25 -29 Overweight ?Nutritional Risks: None ?Diabetes: No ? ?How often do you need to have someone help you when you read instructions, pamphlets, or other written materials from your doctor or pharmacy?: 1 - Never ?Diabetic?No  ? ?  ? ?  ? ? ?Activities of Daily Living ? ?  07/02/2021  ?  2:22 PM 01/12/2021  ?  1:37 PM  ?In your present state of health, do you have any difficulty performing the following activities:  ?Hearing? 1 1  ?Comment Rt ear   ?Vision? 1 1  ?Difficulty concentrating or making decisions? 0 0  ?Walking or climbing stairs? 1 0  ?Comment Rt knee   ?Dressing or bathing? 0 1  ?Doing errands, shopping? 0 0  ?Preparing Food and eating ? N   ?Using the Toilet? N   ?In the past six months, have you accidently leaked urine? N   ?Do you have problems with loss of bowel control? N   ?Managing your Medications? N   ?Managing your Finances? N   ?Housekeeping or managing your Housekeeping? N   ? ? ?Patient Care Team: ?Lorre MunroeBaity, Regina W, NP as PCP - General (Internal Medicine) ?Marlowe Saxate, Pamela J, RN as Case Manager (General Practice) ?Delles, Jackelyn PolingElisabeth A, RPH-CPP as Pharmacist (Pharmacist) ?Derenda MisBower, [facility]  ? ?Indicate any recent Medical Services you  may have received from other than [facility] providers in the past year (date may be approximate). ?No hospitalization in the past 12 months. ?   ?Assessment:  ? This is a routine wellness examination for Duchess. ? ?Hearing/Vision screen ?No results found. ? ?Dietary issues and exercise activities discussed: ?Current Exercise Habits: The patient does not participate in regular exercise at present, Exercise limited by: None identified ?Pt reports that she eat out a lot.  She state that she still try to make healthy choices. Her diet consist of lean meat, vegetables/fruits and a scratch.   ? Goals Addressed   ? ?  ?  ?  ?  ? This Visit's Progress  ?  Activity and Exercise Increased     ?  Evidence-based guidance:  ?Review current exercise levels.  ?Assess patient perspective on exercise or activity level, barriers to increasing activity, motivation and readiness for change.  ?Recommend or set healthy exercise goal based on individual tolerance.  ?Encourage small steps toward making change in amount of exercise or activity.  ?Urge reduction of sedentary activities or screen time.  ?Promote group activities within the community or with family or support person.  ?Consider referral to rehabiliation therapist for assessment and exercise/activity plan.   ?Notes:  ?  ? ?  ? ?Depression Screen ? ?  07/02/2021  ?  2:21 PM 01/12/2021  ?  1:36 PM 07/11/2020  ?  2:21 PM 06/24/2020  ?  2:59 PM 09/13/2019  ?  8:12 AM 08/30/2019  ?  1:46 PM 06/19/2019  ?  2:43 PM  ?PHQ 2/9 Scores  ?PHQ - 2 Score 1 3 2  0 0 1 0  ?PHQ- 9 Score 5 10 3   0 5   ?  ?Fall Risk ? ?  07/02/2021  ?  2:20 PM 07/11/2020  ?  2:23 PM 06/24/2020  ?  2:59 PM 11/20/2019  ?  9:58 AM 11/09/2019  ? 11:16 AM  ?Fall Risk   ?Falls in the past year? 0 0 0 0 0  ?Number falls in past yr: 0 0  0 0  ?Injury with Fall? 0   0 0  ?Risk for fall due to : No Fall Risks No Fall Risks Medication side effect    ?Follow up Falls evaluation completed Falls evaluation completed Falls evaluation  completed;Education provided;Falls prevention discussed    ? ? ?FALL RISK PREVENTION PERTAINING TO THE HOME: ? ?Any stairs in or around the home? No  ?If so, are there any without handrails? No  ?Home free of loose throw ru

## 2021-07-03 ENCOUNTER — Other Ambulatory Visit: Payer: Self-pay | Admitting: Internal Medicine

## 2021-07-03 NOTE — Telephone Encounter (Signed)
Requested Prescriptions  ?Pending Prescriptions Disp Refills  ?? pantoprazole (PROTONIX) 40 MG tablet [Pharmacy Med Name: PANTOPRAZOLE SODIUM 40 MG DR TAB] 90 tablet 1  ?  Sig: TAKE 1 TABLET BY MOUTH DAILY  ?  ? Gastroenterology: Proton Pump Inhibitors Passed - 07/03/2021  2:58 PM  ?  ?  Passed - Valid encounter within last 12 months  ?  Recent Outpatient Visits   ?      ? 5 months ago Medicare annual wellness visit, subsequent  ? [facility], Salvadore Oxford, NP  ? 11 months ago Arthritis  ? [facility], Salvadore Oxford, NP  ? 1 year ago Depression with anxiety  ? [facility], Jodelle Gross, FNP  ? 1 year ago Essential hypertension  ? [facility] Jodelle Gross, FNP  ? 1 year ago Hospital discharge follow-up  ? [facility], Jodelle Gross, FNP  ?  ?  ?Future Appointments   ?        ? In 6 days Baity, Salvadore Oxford, NP [facility]  ?  ? ?  ?  ?  ? ? ?

## 2021-07-09 ENCOUNTER — Ambulatory Visit (INDEPENDENT_AMBULATORY_CARE_PROVIDER_SITE_OTHER): Payer: Medicare HMO | Admitting: Internal Medicine

## 2021-07-09 ENCOUNTER — Encounter: Payer: Self-pay | Admitting: Internal Medicine

## 2021-07-09 VITALS — BP 132/78 | HR 72 | Temp 97.3°F | Wt 153.0 lb

## 2021-07-09 DIAGNOSIS — I10 Essential (primary) hypertension: Secondary | ICD-10-CM | POA: Diagnosis not present

## 2021-07-09 DIAGNOSIS — I700 Atherosclerosis of aorta: Secondary | ICD-10-CM | POA: Diagnosis not present

## 2021-07-09 DIAGNOSIS — E7800 Pure hypercholesterolemia, unspecified: Secondary | ICD-10-CM | POA: Diagnosis not present

## 2021-07-09 DIAGNOSIS — F319 Bipolar disorder, unspecified: Secondary | ICD-10-CM

## 2021-07-09 DIAGNOSIS — K5901 Slow transit constipation: Secondary | ICD-10-CM | POA: Diagnosis not present

## 2021-07-09 DIAGNOSIS — Z6825 Body mass index (BMI) 25.0-25.9, adult: Secondary | ICD-10-CM

## 2021-07-09 DIAGNOSIS — K219 Gastro-esophageal reflux disease without esophagitis: Secondary | ICD-10-CM

## 2021-07-09 DIAGNOSIS — Z6827 Body mass index (BMI) 27.0-27.9, adult: Secondary | ICD-10-CM | POA: Insufficient documentation

## 2021-07-09 DIAGNOSIS — F411 Generalized anxiety disorder: Secondary | ICD-10-CM | POA: Diagnosis not present

## 2021-07-09 DIAGNOSIS — E663 Overweight: Secondary | ICD-10-CM | POA: Insufficient documentation

## 2021-07-09 DIAGNOSIS — F419 Anxiety disorder, unspecified: Secondary | ICD-10-CM | POA: Diagnosis not present

## 2021-07-09 DIAGNOSIS — M1990 Unspecified osteoarthritis, unspecified site: Secondary | ICD-10-CM

## 2021-07-09 MED ORDER — PANTOPRAZOLE SODIUM 40 MG PO TBEC: 40.0000 mg | DELAYED_RELEASE_TABLET | Freq: Every day | ORAL | 1 refills | Status: DC

## 2021-07-09 MED ORDER — ASPIRIN 81 MG PO TBEC: 81.0000 mg | DELAYED_RELEASE_TABLET | Freq: Every day | ORAL | 1 refills | Status: AC

## 2021-07-09 MED ORDER — CLONAZEPAM 0.5 MG PO TABS: 0.5000 mg | ORAL_TABLET | Freq: Three times a day (TID) | ORAL | 0 refills | Status: DC

## 2021-07-09 MED ORDER — PAROXETINE HCL 40 MG PO TABS: 40.0000 mg | ORAL_TABLET | Freq: Every day | ORAL | 1 refills | Status: DC
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
## 2021-07-09 NOTE — Assessment & Plan Note (Signed)
Encouraged high-fiber diet and adequate water intake Continue MiraLAX 

## 2021-07-09 NOTE — Assessment & Plan Note (Signed)
Encourage weight loss as this can help reduce joint pain Continue Tylenol OTC as needed 

## 2021-07-09 NOTE — Assessment & Plan Note (Signed)
Controlled off meds Reinforced DASH diet and exercise for weight loss We will monitor 

## 2021-07-09 NOTE — Assessment & Plan Note (Signed)
Stable on Paroxetine and Clonazepam, refilled today ?Support offered ?

## 2021-07-09 NOTE — Assessment & Plan Note (Signed)
Encourage weight loss as this can help reduce reflux symptoms Continue Pantoprazole 

## 2021-07-09 NOTE — Assessment & Plan Note (Signed)
Encourage diet and exercise for weight loss 

## 2021-07-09 NOTE — Assessment & Plan Note (Signed)
Stable on Paroxetine and Clonazepam, refilled today ?Support offered ?

## 2021-07-09 NOTE — Assessment & Plan Note (Signed)
C-Met and lipid profile today ?Encouraged her to consume a low-fat diet ?She declines statin therapy in the past ?

## 2021-07-09 NOTE — Assessment & Plan Note (Signed)
C-Met and lipid profile today ?Encouraged her to consume a low-fat diet ?She declines statin therapy in the past ?We will have her start daily Aspirin ?

## 2021-07-09 NOTE — Patient Instructions (Signed)
Fiber Content in Foods Fiber is a substance that is found in plant foods, such as fruits, vegetables, whole grains, nuts, seeds, and beans. As part of your treatment and recovery plan, your health care provider may recommend that you eat foods that have specific amounts of dietary fiber. Some conditions may require a high-fiber diet while others may require a low-fiber diet. This sheet gives you information about the dietary fiber content of some common foods. Your health care provider will tell you how much fiber you need in your diet. If you have problems or questions, contact your health care provider or dietitian. What foods are high in fiber?  Fruits Blackberries or raspberries (fresh) --  cup (75 g) has 4 g of fiber. Pear (fresh) -- 1 medium (180 g) has 5.5 g of fiber. Prunes (dried) -- 6 to 8 pieces (57-76 g) has 5 g of fiber. Apple with skin -- 1 medium (182 g) has 4.8 g of fiber. Guava -- 1 cup (128 g) has 8.9 g of fiber. Vegetables Peas (frozen) --  cup (80 g) has 4.4 g of fiber. Potato with skin (baked) -- 1 medium (173 g) has 4.4 g of fiber. Pumpkin (canned) --  cup (122 g) has 5 g of fiber. Brussels sprouts (cooked) --  cup (78 g) has 4 g of fiber. Sweet potato --  cup mashed (124 g) has 4 g of fiber. Winter squash -- 1 cup cooked (205 g) has 5.7 g of fiber. Grains Bran cereal --  cup (31 g) has 8.6 g of fiber. Bulgur (cooked) --  cup (70 g) has 4 g of fiber. Quinoa (cooked) -- 1 cup (185 g) has 5.2 g of fiber. Popcorn -- 3 cups (375 g) popped has 5.8 g of fiber. Spaghetti, whole wheat -- 1 cup (140 g) has 6 g of fiber. Meats and other proteins Pinto beans (cooked) --  cup (90 g) has 7.7 g of fiber. Lentils (cooked) --  cup (90 g) has 7.8 g of fiber. Kidney beans (canned) --  cup (92.5 g) has 5.7 g of fiber. Soybeans (canned, frozen, or fresh) --  cup (92.5 g) has 5.2 g of fiber. Baked beans, plain or vegetarian (canned) --  cup (130 g) has 5.2 g of  fiber. Garbanzo beans or chickpeas (canned) --  cup (90 g) has 6.6 g of fiber. Black beans (cooked) --  cup (86 g) has 7.5 g of fiber. White beans or navy beans (cooked) --  cup (91 g) has 9.3 g of fiber. The items listed above may not be a complete list of foods with high fiber. Actual amounts of fiber may be different depending on processing. Contact a dietitian for more information. What foods are moderate in fiber?  Fruits Banana -- 1 medium (126 g) has 3.2 g of fiber. Melon -- 1 cup (155 g) has 1.4 g of fiber. Orange -- 1 small (154 g) has 3.7 g of fiber. Raisins --  cup (40 g) has 1.8 g of fiber. Applesauce, sweetened --  cup (125 g) has 1.5 g of fiber. Blueberries (fresh) --  cup (75 g) has 1.8 g of fiber. Strawberries (fresh, sliced) -- 1 cup (150 g) has 3 g of fiber. Cherries -- 1 cup (140 g) has 2.9 g of fiber. Vegetables Broccoli (cooked) --  cup (77.5 g) has 2.1 g of fiber. Carrots (cooked) --  cup (77.5 g) has 2.2 g of fiber. Corn (canned or frozen) --  cup (82.5 g)   has 2.1 g of fiber. Potatoes, mashed --  cup (105 g) has 1.6 g of fiber. Tomato -- 1 medium (62 g) has 1.5 g of fiber. Green beans (canned) --  cup (83 g) has 2 g of fiber. Squash, winter --  cup (58 g) has 1 g of fiber. Sweet potato, baked -- 1 medium (150 g) has 3 g of fiber. Cauliflower (cooked) -- 1/2 cup (90 g) has 2.3 g of fiber. Grains Long-grain brown rice (cooked) -- 1 cup (196 g) has 3.5 g of fiber. Bagel, plain -- one 4-inch (10 cm) bagel has 2 g of fiber. Instant oatmeal --  cup (120 g) has about 2 g of fiber. Macaroni noodles, enriched (cooked) -- 1 cup (140 g) has 2.5 g of fiber. Multigrain cereal --  cup (15 g) has about 2-4 g of fiber. Whole-wheat bread -- 1 slice (26 g) has 2 g of fiber. Whole-wheat spaghetti noodles --  cup (70 g) has 3.2 g of fiber. Corn tortilla -- one 6-inch (15 cm) tortilla has 1.5 g of fiber. Meats and other proteins Almonds --  cup or 1 oz (28 g) has  3.5 g of fiber. Sunflower seeds in shell --  cup or  oz (11.5 g) has 1.1 g of fiber. Vegetable or soy patty -- 1 patty (70 g) has 3.4 g of fiber. Walnuts --  cup or 1 oz (30 g) has 2 g of fiber. Flax seed -- 1 Tbsp (7 g) has 2.8 g of fiber. The items listed above may not be a complete list of foods that have moderate amounts of fiber. Actual amounts of fiber may be different depending on processing. Contact a dietitian for more information. What foods are low in fiber?  Low-fiber foods contain less than 1 g of fiber per serving. They include: Fruits Fruit juice --  cup or 4 fl oz (118 mL) has 0.5 g of fiber. Vegetables Lettuce -- 1 cup (35 g) has 0.5 g of fiber. Cucumber (slices) --  cup (60 g) has 0.3 g of fiber. Celery -- 1 stalk (40 g) has 0.1 g of fiber. Grains Flour tortilla -- one 6-inch (15 cm) tortilla has 0.5 g of fiber. White rice (cooked) --  cup (81.5 g) has 0.3 g of fiber. Meats and other proteins Egg -- 1 large (50 g) has 0 g of fiber. Meat, poultry, or fish -- 3 oz (85 g) has 0 g of fiber. Dairy Milk -- 1 cup or 8 fl oz (237 mL) has 0 g of fiber. Yogurt -- 1 cup (245 g) has 0 g of fiber. The items listed above may not be a complete list of foods that are low in fiber. Actual amounts of fiber may be different depending on processing. Contact a dietitian for more information. Summary Fiber is a substance that is found in plant foods, such as fruits, vegetables, whole grains, nuts, seeds, and beans. As part of your treatment and recovery plan, your health care provider may recommend that you eat foods that have specific amounts of dietary fiber. This information is not intended to replace advice given to you by your health care provider. Make sure you discuss any questions you have with your health care provider. Document Revised: 07/05/2019 Document Reviewed: 07/05/2019 Elsevier Patient Education  2023 Elsevier Inc.  

## 2021-07-10 LAB — COMPLETE METABOLIC PANEL WITH GFR
AG Ratio: 1.5 (calc) (ref 1.0–2.5)
ALT: 9 U/L (ref 6–29)
AST: 16 U/L (ref 10–35)
Albumin: 3.8 g/dL (ref 3.6–5.1)
Alkaline phosphatase (APISO): 112 U/L (ref 37–153)
BUN/Creatinine Ratio: 11 (calc) (ref 6–22)
BUN: 12 mg/dL (ref 7–25)
CO2: 28 mmol/L (ref 20–32)
Calcium: 9.2 mg/dL (ref 8.6–10.4)
Chloride: 104 mmol/L (ref 98–110)
Creat: 1.05 mg/dL — ABNORMAL HIGH (ref 0.60–1.00)
Globulin: 2.6 g/dL (calc) (ref 1.9–3.7)
Glucose, Bld: 90 mg/dL (ref 65–139)
Potassium: 4 mmol/L (ref 3.5–5.3)
Sodium: 139 mmol/L (ref 135–146)
Total Bilirubin: 1.1 mg/dL (ref 0.2–1.2)
Total Protein: 6.4 g/dL (ref 6.1–8.1)
eGFR: 56 mL/min/{1.73_m2} — ABNORMAL LOW (ref >=60)

## 2021-07-10 LAB — LIPID PANEL
Cholesterol: 183 mg/dL (ref <200)
HDL: 54 mg/dL (ref >=50)
LDL Cholesterol (Calc): 106 mg/dL (calc) — ABNORMAL HIGH
Non-HDL Cholesterol (Calc): 129 mg/dL (calc) (ref <130)
Total CHOL/HDL Ratio: 3.4 (calc) (ref <5.0)
Triglycerides: 131 mg/dL (ref <150)

## 2021-07-30 ENCOUNTER — Telehealth: Payer: Self-pay

## 2021-07-30 ENCOUNTER — Ambulatory Visit (INDEPENDENT_AMBULATORY_CARE_PROVIDER_SITE_OTHER): Payer: Medicare HMO

## 2021-07-30 ENCOUNTER — Telehealth: Payer: Medicare HMO

## 2021-07-30 DIAGNOSIS — K219 Gastro-esophageal reflux disease without esophagitis: Secondary | ICD-10-CM

## 2021-07-30 DIAGNOSIS — F3289 Other specified depressive episodes: Secondary | ICD-10-CM

## 2021-07-30 DIAGNOSIS — F418 Other specified anxiety disorders: Secondary | ICD-10-CM

## 2021-07-30 DIAGNOSIS — I10 Essential (primary) hypertension: Secondary | ICD-10-CM

## 2021-07-30 DIAGNOSIS — F319 Bipolar disorder, unspecified: Secondary | ICD-10-CM

## 2021-07-30 DIAGNOSIS — E7800 Pure hypercholesterolemia, unspecified: Secondary | ICD-10-CM

## 2021-07-30 DIAGNOSIS — F419 Anxiety disorder, unspecified: Secondary | ICD-10-CM

## 2021-07-30 NOTE — Telephone Encounter (Signed)
  Care Management   Follow Up Note   07/30/2021 Name: Katrina Sutton MRN: [id number] DOB: 1950-08-14   Referred by: Jearld Fenton, NP Reason for referral : Chronic Care Management (RNCM: Follow up for Chronic Disease Management and Care Coordination Needs )   The patient returned the call to the [facility] and the call was completed. See new encounter for information.   Follow Up Plan: Telephone follow up appointment with care management team member scheduled for: 10-29-2021 at 7 am  Noreene Larsson RN, MSN, [place]: [phone]

## 2021-07-30 NOTE — Patient Instructions (Signed)
Visit Information  Thank you for taking time to visit with me today. Please don't hesitate to contact me if I can be of assistance to you before our next scheduled telephone appointment.  Following are the goals we discussed today:  Anxiety, Depression, and Bipolar Disorder  (Status: Goal on Track (progressing): YES.) Long Term Goal  Evaluation of current treatment plan related to Anxiety, Depression, and Bipolar Disorder, Mental Health Concerns  self-management and patient's adherence to plan as established by provider. 04-09-2021: The patient is doing well. She has a lot of stressors but she works through it. She has learned to rest and make her health a priority. She states that it is helpful for her to work with the CCM team to talk out her anxieties. She recently had to get her drivers license renewed and she is thankful that she had "smooth sailing", with no "panic attacks". She is upbeat and positive today. She does get frustrated because her husband is gone a lot and does not want to help in the household and her son is diabetic and eats what he wants to eat. She has learned to let go of things she cannot control and focusing on her own health. Praised the patient for positive changes. 07-30-2021: The patient is doing well and states she is sticking to her routine and managing well. She is mindful of triggers that exacerbate her anxiety and denies any panic attacks. She states her and her son get out some but mostly she stays around home. She feels she is doing well with management of her mental health and well being. Reflective listening and support given.  Discussed plans with patient for ongoing care management follow up and provided patient with direct contact information for care management team Advised patient to call the office for changes mood, anxiety, or depression ; Provided education to patient re: utilizing breathing exercises when she gets upset, read devoltionals, call friends when she  is having a bad day, do things that make her happy when she is down; Reviewed medications with patient and discussed compliance. 01-23-2021: The patient called the RNCM and states that she is going to run out of her Klonopin before the weekend is out. She does not want to go through withdrawals. Secure chat with CMA and pcp and the patients refill has been called in today but cannot be refilled until Sunday. Instructed the patient of this. The patient states that she will not be able to pick the medication up until Monday as they are closed on Sunday. She states she will be okay. Reassurance given. The patient appreciated the RNCM talking to her and letting her know the pharmacy had been sent the refill request. Edication and support given. 07-30-2021: The patient has her medications and denies any acute findings. She saw the pcp recently and has refills for her medications. Denies any issues with compliance of her medications. ; Discussed plans with patient for ongoing care management follow up and provided patient with direct contact information for care management team; Advised patient to discuss concerns or changes in her thoughts, changes in her depression and anxiety levels with provider. 04-09-2021: Reflective listening with the patient today. She was able to express things that make her anxious and down. She is working through these issues with talking to a good friend, interaction with the CCM team, regular provider visits, doing her puzzles and devotions, and prayer.  She is open about her struggles and is thankful for the support she has. 05-28-2021:  The patient is stable and denies any acute changes with her anxiety, depression or bipolar. The patient states that she is active and stays busy during the day. She is a "night owl" and likes to stay up a little later than usual. Education and support given. 07-30-2021: The patient is doing well currently and states she is having a good day. She has a routine  and she usually sticks to her routine and that is helpful for her. She knows to call if there are changes in  her conditions.; Screening for signs and symptoms of depression related to chronic disease state;  Assessed social determinant of health barriers;    Hypertension: (Status: Goal on Track (progressing): YES.) Last practice recorded BP readings:     BP Readings from Last 3 Encounters:  07/09/21 132/78  07/02/21 (!) 142/70  01/12/21 139/89  Most recent eGFR/CrCl:       Lab Results  Component Value Date    EGFR 56 (L) 07/09/2021    No components found for: CRCL   Evaluation of current treatment plan related to hypertension self management and patient's adherence to plan as established by provider. 07-30-2021: The patient states that she is doing well with management of her HTN. She denies any issues with her HTN or heart health. States sometimes when she goes to the doctor that her blood pressures are a little elevated but denies any headaches or other concerns. Will continue to monitor. ;   Reviewed prescribed diet heart healthy. 05-28-2021: The patient is compliant with heart healthy diet. The patient states that she watches what she eats and the Protonix is working better for her. She knows what foods cause her to have an exacerbation of her GERD. 07-30-2021: Denies any issues with heart healthy diet. She was given handouts at her last appointment on eating foods high in fiber and following the dash diet. The patient feels she is eating well and staying hydrated. Has added fiber foods to her dietary intake.  Reviewed medications with patient and discussed importance of compliance. 07-30-2021: The patient is compliant with her medications. Denies any new concerns;  Discussed plans with patient for ongoing care management follow up and provided patient with direct contact information for care management team; Advised patient, providing education and rationale, to monitor blood pressure daily  and record, calling PCP for findings outside established parameters;  Provided education on prescribed diet heart healthy. 07-30-2021: Review of heart healthy diet and maintaining healthy eating habits. Review of DASH diet and foods high in fiber to help with her dietary health and well being;  Discussed complications of poorly controlled blood pressure such as heart disease, stroke, circulatory complications, vision complications, kidney impairment, sexual dysfunction;      GERD  (Status: Goal on Track (progressing): YES.) Long Term Goal  Evaluation of current treatment plan related to GERD,  self-management and patient's adherence to plan as established by provider. 05-28-2021: The patient is stable and denies any acute distress today. She states her medications are working well for her and she is not having any exacerbations of her GERD. 07-30-2021: The patient states that she has not had any exacerbations with her GERD. She does have some incidence of food not wanting to go down but she states usually she will drink a lot of water or beverage and it will go down. Has some concern with constipation at times but not to the point where she need disimpacting. The patient is mindful of changes in her sx and  sx and knows to call for questions or concerns. Will continue to monitor.  Discussed plans with patient for ongoing care management follow up and provided patient with direct contact information for care management team Advised patient to call the office for changes in GERD sx and sx, constipation, or diarrhea; Provided education to patient re: avoiding spicy foods, monitoring for foods that cause exacerbation of condition, sitting up when eating, not laying down at least 30 minutes after eating, and monitoring bowel habits for changes. ; Reviewed medications with patient and discussed compliance. 05-28-2021: The patient states the Protonix is working better for her and she is doing well. She has her  medications and takes as prescribed. 07-30-2021: The patient is compliant with her medications regimen. It is working well for her; Reviewed scheduled/upcoming provider appointments including next appointment with the pcp is 01-08-2022 at 120 pm; Discussed plans with patient for ongoing care management follow up and provided patient with direct contact information for care management team; Advised patient to discuss changes in bowel habits, exacerbations of GERD, and any questions and concerns related to GI health and well being with provider;      Hyperlipidemia:  (Status: New goal. Goal on Track (progressing): YES.) Long Term Goal       Lab Results  Component Value Date    CHOL 183 07/09/2021    HDL 54 07/09/2021    LDLCALC 106 (H) 07/09/2021    TRIG 131 07/09/2021    CHOLHDL 3.4 07/09/2021      Medication review performed; medication list updated in electronic medical record. 07-30-2021: The patient does not take a statin but Aspirin was added to her regimen at the last visit with the pcp. The patient ask about this today and education provided. Will continue to monitor for changes, new questions or needs Provider established cholesterol goals reviewed. 07-30-2021: Review of goal of cholesterol levels. Education and support provided.  Counseled on importance of regular laboratory monitoring as prescribed. 07-30-2021: The patient has labs done on a regular basis. Last done in April of 2023.; Provided HLD educational materials; Reviewed role and benefits of statin for ASCVD risk reduction; Discussed strategies to manage statin-induced myalgias; Reviewed importance of limiting foods high in cholesterol. 07-30-2021: Review of DASH diet and monitoring for hidden sodium and high fat content in foods. ;     Our next appointment is by telephone on 10-29-2021 at 1030 am  Please call the care guide team at [phone] if you need to cancel or reschedule your appointment.   If you are experiencing a  Mental Health or [place] or need someone to talk to, please call the Suicide and Crisis Lifeline: 988 call the [place] National Suicide Prevention Lifeline: 989-627-7089 or TTY: 564-009-2825 TTY 901-468-8184) to talk to a trained counselor call 1-800-273-TALK (toll free, 24 hour hotline)   The patient verbalized understanding of instructions, educational materials, and care plan provided today and DECLINED offer to receive copy of patient instructions, educational materials, and care plan.   Noreene Larsson RN, MSN, [place]: [phone]

## 2021-07-30 NOTE — Chronic Care Management (AMB) (Signed)
Chronic Care Management   CCM RN Visit Note  07/30/2021 Name: Katrina Sutton MRN: [id number] DOB: 05-09-1950  Subjective: Katrina Sutton is a 71 y.o. year old female who is a primary care patient of Jearld Fenton, NP. The care management team was consulted for assistance with disease management and care coordination needs.    Engaged with patient by telephone for follow up visit in response to provider referral for case management and/or care coordination services.   Consent to Services:  The patient was given information about Chronic Care Management services, agreed to services, and gave verbal consent prior to initiation of services.  Please see initial visit note for detailed documentation.   Patient agreed to services and verbal consent obtained.   Assessment: Review of patient past medical history, allergies, medications, health status, including review of consultants reports, laboratory and other test data, was performed as part of comprehensive evaluation and provision of chronic care management services.   SDOH (Social Determinants of Health) assessments and interventions performed:    CCM Care Plan  Allergies  Allergen Reactions   Erythromycin Shortness Of Breath and Rash   Duloxetine Palpitations    Outpatient Encounter Medications as of 07/30/2021  Medication Sig Note   acetaminophen (TYLENOL) 500 MG tablet Take 500 mg by mouth every 8 (eight) hours as needed for headache.    aspirin 81 MG EC tablet Take 1 tablet (81 mg total) by mouth daily. Swallow whole.    clonazePAM (KLONOPIN) 0.5 MG tablet Take 1 tablet (0.5 mg total) by mouth 3 (three) times daily.    hydrocortisone cream 1 % Apply 1 application topically 2 (two) times daily as needed for itching.  06/19/2019: Very seldom    pantoprazole (PROTONIX) 40 MG tablet Take 1 tablet (40 mg total) by mouth daily.    PARoxetine (PAXIL) 40 MG tablet Take 1 tablet (40 mg total) by mouth daily.    polyethylene glycol (MIRALAX /  GLYCOLAX) 17 g packet Take 17 g by mouth daily.    No facility-administered encounter medications on file as of 07/30/2021.    Patient Active Problem List   Diagnosis Date Noted   Pure hypercholesterolemia 07/09/2021   Overweight with body mass index (BMI) of 25 to 25.9 in adult 07/09/2021   Aortic atherosclerosis ([place]) 01/12/2021   Hypertension 08/30/2019   Constipation 04/17/2019   Bipolar 1 disorder ([place]) 08/23/2017   GERD (gastroesophageal reflux disease) 12/27/2016   Arthritis 12/27/2016   GAD (generalized anxiety disorder) 12/27/2016    Conditions to be addressed/monitored:HTN, HLD, Anxiety, Depression, Bipolar Disorder, and GERD  Care Plan : RNCM: General Plan of Care (Adult) for Chronic Disease Management and Care Coordination Needs  Updates made by Vanita Ingles, RN since 07/30/2021 12:00 AM     Problem: RNCM: Development of Plan of Care for Chronic Disease Management (GERD, Anxiety, Depression, bipolar, and HTN)   Priority: High     Long-Range Goal: RNCM: Effective Management  of Plan of Care for Chronic Disease Management (GERD, Anxiety, Depression, bipolar, and HTN)   Start Date: 01/12/2021  Expected End Date: 01/12/2022  Priority: High  Note:   Current Barriers:  Knowledge Deficits related to plan of care for management of GERD, HTN and Anxiety with Excessive Worry, Panic Symptoms, Social Anxiety,, Depression: depressed mood anxiety panic attacks, Bipolar Disorder, and Mood Instability  Chronic Disease Management support and education needs related to GERD,  HTN and Anxiety with Excessive Worry, Panic Symptoms, Social Anxiety,, Depression: depressed mood anxiety  panic attacks, and Bipolar Disorder Lacks caregiver support.   Assist in the care of adult child       RNCM Clinical Goal(s):  Patient will verbalize understanding of plan for management of GERD, HTN, Anxiety, Depression, and Bipolar Disorder as evidenced by following the plan of care, seeing pcp on  a regular basis, calling office for changes in conditions  take all medications exactly as prescribed and will call provider for medication related questions as evidenced by compliance with medications regimen and calling the office for changes or questions     demonstrate improved and ongoing health management independence as evidenced by normalized chronic conditions without exacerbations         demonstrate a decrease in GERD, HTN, Anxiety, Depression, and Bipolar Disorder exacerbations  as evidenced by working with the CCM team and pcp to optimize health and well being  demonstrate ongoing self health care management ability for effective management of chronic conditions  as evidenced by working with the CCM team through collaboration with Consulting civil engineer, provider, and care team.   Interventions: 1:1 collaboration with primary care provider regarding development and update of comprehensive plan of care as evidenced by provider attestation and co-signature Inter-disciplinary care team collaboration (see longitudinal plan of care) Evaluation of current treatment plan related to  self management and patient's adherence to plan as established by provider   SDOH Barriers (Status: Goal on Track (progressing): YES.) Long Term Goal  Patient interviewed and SDOH assessment performed        Patient interviewed and appropriate assessments performed Provided patient with information about resources available for help with SDOH and care guides to assist with any new needs or concerns Discussed plans with patient for ongoing care management follow up and provided patient with direct contact information for care management team Advised patient to call the office for changes in SDOH, questions or concerns Reviewed with patient resources available to help with any needs that may arise for optimal management of health and well being    Anxiety, Depression, and Bipolar Disorder  (Status: Goal on Track  (progressing): YES.) Long Term Goal  Evaluation of current treatment plan related to Anxiety, Depression, and Bipolar Disorder, Mental Health Concerns  self-management and patient's adherence to plan as established by provider. 04-09-2021: The patient is doing well. She has a lot of stressors but she works through it. She has learned to rest and make her health a priority. She states that it is helpful for her to work with the CCM team to talk out her anxieties. She recently had to get her drivers license renewed and she is thankful that she had "smooth sailing", with no "panic attacks". She is upbeat and positive today. She does get frustrated because her husband is gone a lot and does not want to help in the household and her son is diabetic and eats what he wants to eat. She has learned to let go of things she cannot control and focusing on her own health. Praised the patient for positive changes. 07-30-2021: The patient is doing well and states she is sticking to her routine and managing well. She is mindful of triggers that exacerbate her anxiety and denies any panic attacks. She states her and her son get out some but mostly she stays around home. She feels she is doing well with management of her mental health and well being. Reflective listening and support given.  Discussed plans with patient for ongoing care management follow up and  provided patient with direct contact information for care management team Advised patient to call the office for changes mood, anxiety, or depression ; Provided education to patient re: utilizing breathing exercises when she gets upset, read devoltionals, call friends when she is having a bad day, do things that make her happy when she is down; Reviewed medications with patient and discussed compliance. 01-23-2021: The patient called the RNCM and states that she is going to run out of her Klonopin before the weekend is out. She does not want to go through withdrawals. Secure  chat with CMA and pcp and the patients refill has been called in today but cannot be refilled until Sunday. Instructed the patient of this. The patient states that she will not be able to pick the medication up until Monday as they are closed on Sunday. She states she will be okay. Reassurance given. The patient appreciated the RNCM talking to her and letting her know the pharmacy had been sent the refill request. Edication and support given. 07-30-2021: The patient has her medications and denies any acute findings. She saw the pcp recently and has refills for her medications. Denies any issues with compliance of her medications. ; Discussed plans with patient for ongoing care management follow up and provided patient with direct contact information for care management team; Advised patient to discuss concerns or changes in her thoughts, changes in her depression and anxiety levels with provider. 04-09-2021: Reflective listening with the patient today. She was able to express things that make her anxious and down. She is working through these issues with talking to a good friend, interaction with the CCM team, regular provider visits, doing her puzzles and devotions, and prayer.  She is open about her struggles and is thankful for the support she has. 05-28-2021: The patient is stable and denies any acute changes with her anxiety, depression or bipolar. The patient states that she is active and stays busy during the day. She is a "night owl" and likes to stay up a little later than usual. Education and support given. 07-30-2021: The patient is doing well currently and states she is having a good day. She has a routine and she usually sticks to her routine and that is helpful for her. She knows to call if there are changes in  her conditions.; Screening for signs and symptoms of depression related to chronic disease state;  Assessed social determinant of health barriers;   Hypertension: (Status: Goal on Track  (progressing): YES.) Last practice recorded BP readings:  BP Readings from Last 3 Encounters:  07/09/21 132/78  07/02/21 (!) 142/70  01/12/21 139/89  Most recent eGFR/CrCl:  Lab Results  Component Value Date   EGFR 56 (L) 07/09/2021    No components found for: CRCL  Evaluation of current treatment plan related to hypertension self management and patient's adherence to plan as established by provider. 07-30-2021: The patient states that she is doing well with management of her HTN. She denies any issues with her HTN or heart health. States sometimes when she goes to the doctor that her blood pressures are a little elevated but denies any headaches or other concerns. Will continue to monitor. ;   Reviewed prescribed diet heart healthy. 05-28-2021: The patient is compliant with heart healthy diet. The patient states that she watches what she eats and the Protonix is working better for her. She knows what foods cause her to have an exacerbation of her GERD. 07-30-2021: Denies any issues with heart healthy  diet. She was given handouts at her last appointment on eating foods high in fiber and following the dash diet. The patient feels she is eating well and staying hydrated. Has added fiber foods to her dietary intake.  Reviewed medications with patient and discussed importance of compliance. 07-30-2021: The patient is compliant with her medications. Denies any new concerns;  Discussed plans with patient for ongoing care management follow up and provided patient with direct contact information for care management team; Advised patient, providing education and rationale, to monitor blood pressure daily and record, calling PCP for findings outside established parameters;  Provided education on prescribed diet heart healthy. 07-30-2021: Review of heart healthy diet and maintaining healthy eating habits. Review of DASH diet and foods high in fiber to help with her dietary health and well being;  Discussed  complications of poorly controlled blood pressure such as heart disease, stroke, circulatory complications, vision complications, kidney impairment, sexual dysfunction;    GERD  (Status: Goal on Track (progressing): YES.) Long Term Goal  Evaluation of current treatment plan related to GERD,  self-management and patient's adherence to plan as established by provider. 05-28-2021: The patient is stable and denies any acute distress today. She states her medications are working well for her and she is not having any exacerbations of her GERD. 07-30-2021: The patient states that she has not had any exacerbations with her GERD. She does have some incidence of food not wanting to go down but she states usually she will drink a lot of water or beverage and it will go down. Has some concern with constipation at times but not to the point where she need disimpacting. The patient is mindful of changes in her sx and sx and knows to call for questions or concerns. Will continue to monitor.  Discussed plans with patient for ongoing care management follow up and provided patient with direct contact information for care management team Advised patient to call the office for changes in GERD sx and sx, constipation, or diarrhea; Provided education to patient re: avoiding spicy foods, monitoring for foods that cause exacerbation of condition, sitting up when eating, not laying down at least 30 minutes after eating, and monitoring bowel habits for changes. ; Reviewed medications with patient and discussed compliance. 05-28-2021: The patient states the Protonix is working better for her and she is doing well. She has her medications and takes as prescribed. 07-30-2021: The patient is compliant with her medications regimen. It is working well for her; Reviewed scheduled/upcoming provider appointments including next appointment with the pcp is 01-08-2022 at 120 pm; Discussed plans with patient for ongoing care management follow up  and provided patient with direct contact information for care management team; Advised patient to discuss changes in bowel habits, exacerbations of GERD, and any questions and concerns related to GI health and well being with provider;    Hyperlipidemia:  (Status: New goal. Goal on Track (progressing): YES.) Long Term Goal  Lab Results  Component Value Date   CHOL 183 07/09/2021   HDL 54 07/09/2021   LDLCALC 106 (H) 07/09/2021   TRIG 131 07/09/2021   CHOLHDL 3.4 07/09/2021     Medication review performed; medication list updated in electronic medical record. 07-30-2021: The patient does not take a statin but Aspirin was added to her regimen at the last visit with the pcp. The patient ask about this today and education provided. Will continue to monitor for changes, new questions or needs Provider established cholesterol goals reviewed. 07-30-2021:  Review of goal of cholesterol levels. Education and support provided.  Counseled on importance of regular laboratory monitoring as prescribed. 07-30-2021: The patient has labs done on a regular basis. Last done in April of 2023.; Provided HLD educational materials; Reviewed role and benefits of statin for ASCVD risk reduction; Discussed strategies to manage statin-induced myalgias; Reviewed importance of limiting foods high in cholesterol. 07-30-2021: Review of DASH diet and monitoring for hidden sodium and high fat content in foods. ;   Patient Goals/Self-Care Activities: Patient will self administer medications as prescribed as evidenced by self report/primary caregiver report  Patient will attend all scheduled provider appointments as evidenced by clinician review of documented attendance to scheduled appointments and patient/caregiver report Patient will call pharmacy for medication refills as evidenced by patient report and review of pharmacy fill history as appropriate Patient will attend church or other social activities as evidenced by patient  report Patient will continue to perform ADL's independently as evidenced by patient/caregiver report Patient will continue to perform IADL's independently as evidenced by patient/caregiver report Patient will call provider office for new concerns or questions as evidenced by review of documented incoming telephone call notes and patient report Patient will work with BSW to address care coordination needs and will continue to work with the clinical team to address health care and disease management related needs as evidenced by documented adherence to scheduled care management/care coordination appointments - check blood pressure weekly - choose a place to take my blood pressure (home, clinic or office, retail store) - write blood pressure results in a log or diary - learn about high blood pressure - keep a blood pressure log - take blood pressure log to all doctor appointments - call doctor for signs and symptoms of high blood pressure - develop an action plan for high blood pressure - keep all doctor appointments - take medications for blood pressure exactly as prescribed - report new symptoms to your doctor - eat more whole grains, fruits and vegetables, lean meats and healthy fats Schedule eye exam. 04-09-2021: The patient states today that she has not had an eye exam in 2 years. Educated the patient on healthy practices and encouraged the patient to schedule an eye exam for evaluation of vision       Plan:Telephone follow up appointment with care management team member scheduled for:  10-29-2021 at 67 am  Aline, MSN, [place] Mobile: [phone]

## 2021-08-11 ENCOUNTER — Other Ambulatory Visit: Payer: Self-pay | Admitting: Internal Medicine

## 2021-08-12 ENCOUNTER — Other Ambulatory Visit: Payer: Self-pay | Admitting: Internal Medicine

## 2021-08-12 DIAGNOSIS — E785 Hyperlipidemia, unspecified: Secondary | ICD-10-CM

## 2021-08-12 DIAGNOSIS — I10 Essential (primary) hypertension: Secondary | ICD-10-CM

## 2021-08-12 DIAGNOSIS — F418 Other specified anxiety disorders: Secondary | ICD-10-CM

## 2021-08-12 DIAGNOSIS — F319 Bipolar disorder, unspecified: Secondary | ICD-10-CM

## 2021-08-12 NOTE — Telephone Encounter (Signed)
Requested medications are due for refill today.  yes  Requested medications are on the active medications list.  yes  Last refill. 07/09/2021 #90 0 refills  Future visit scheduled.   yes  Notes to clinic.  Medication refill is not delegated.    Requested Prescriptions  Pending Prescriptions Disp Refills   clonazePAM (KLONOPIN) 0.5 MG tablet [Pharmacy Med Name: CLONAZEPAM 0.5 MG TAB] 90 tablet     Sig: TAKE 1 TABLET BY MOUTH 3 TIMES A DAY     Not Delegated - Psychiatry: Anxiolytics/Hypnotics 2 Failed - 08/11/2021 10:24 AM      Failed - This refill cannot be delegated      Failed - Urine Drug Screen completed in last 360 days      Passed - Patient is not pregnant      Passed - Valid encounter within last 6 months    Recent Outpatient Visits           1 month ago Aortic atherosclerosis (HCC)   [facility], Salvadore Oxford, NP   7 months ago Medicare annual wellness visit, subsequent   [facility] [place], Salvadore Oxford, NP   1 year ago Arthritis   [facility] [place], Salvadore Oxford, NP   1 year ago Depression with anxiety   [facility], Jodelle Gross, FNP   1 year ago Essential hypertension   [facility], Jodelle Gross, FNP       Future Appointments             In 4 months Baity, Salvadore Oxford, NP [facility], [facility]

## 2021-08-13 NOTE — Telephone Encounter (Signed)
Already addressed this AM, duplicate request. Requested Prescriptions  Refused Prescriptions Disp Refills  . clonazePAM (KLONOPIN) 0.5 MG tablet [Pharmacy Med Name: CLONAZEPAM 0.5 MG TAB] 90 tablet     Sig: TAKE 1 TABLET BY MOUTH 3 TIMES DAILY     Not Delegated - Psychiatry: Anxiolytics/Hypnotics 2 Failed - 08/12/2021 10:10 AM      Failed - This refill cannot be delegated      Failed - Urine Drug Screen completed in last 360 days      Passed - Patient is not pregnant      Passed - Valid encounter within last 6 months    Recent Outpatient Visits          1 month ago Aortic atherosclerosis ([place])   [facility], Coralie Keens, NP   7 months ago Medicare annual wellness visit, subsequent   [facility] [place], Coralie Keens, NP   1 year ago [facility], Coralie Keens, NP   1 year ago Depression with anxiety   [facility], Lupita Raider, FNP   1 year ago Essential hypertension   [facility], Lupita Raider, FNP      Future Appointments            In 4 months Baity, Coralie Keens, NP [facility], [facility]

## 2021-09-07 ENCOUNTER — Other Ambulatory Visit: Payer: Self-pay | Admitting: Internal Medicine

## 2021-10-05 ENCOUNTER — Other Ambulatory Visit: Payer: Self-pay | Admitting: Internal Medicine

## 2021-10-06 NOTE — Telephone Encounter (Signed)
Requested medication (s) are due for refill today: yes  Requested medication (s) are on the active medication list: yes  Last refill:  09/08/21 #90/0  Future visit scheduled: yes  Notes to clinic:  Unable to refill per protocol, cannot delegate. Pharmacy requested fu      Requested Prescriptions  Pending Prescriptions Disp Refills   clonazePAM (KLONOPIN) 0.5 MG tablet [Pharmacy Med Name: CLONAZEPAM 0.5 MG TAB] 90 tablet     Sig: TAKE 1 TABLET BY MOUTH 3 TIMES A DAY     Not Delegated - Psychiatry: Anxiolytics/Hypnotics 2 Failed - 10/05/2021 12:29 PM      Failed - This refill cannot be delegated      Failed - Urine Drug Screen completed in last 360 days      Passed - Patient is not pregnant      Passed - Valid encounter within last 6 months    Recent Outpatient Visits           2 months ago Aortic atherosclerosis (HCC)   [facility], Salvadore Oxford, NP   8 months ago Medicare annual wellness visit, subsequent   [facility], Salvadore Oxford, NP   1 year ago Arthritis   [place], Salvadore Oxford, NP   1 year ago Depression with anxiety   [facility], Jodelle Gross, FNP   1 year ago Essential hypertension   [facility], Jodelle Gross, FNP       Future Appointments             In 3 months Baity, Salvadore Oxford, NP [facility], [facility]

## 2021-10-29 ENCOUNTER — Telehealth: Payer: Medicare HMO

## 2021-10-29 ENCOUNTER — Ambulatory Visit: Payer: Self-pay

## 2021-10-29 NOTE — Patient Instructions (Signed)
Visit Information  Thank you for taking time to visit with me today. Please don't hesitate to contact me if I can be of assistance to you.   Following are the goals we discussed today:   Goals Addressed             This Visit's Progress    RNCM: Effective Management of Anxiety       Care Coordination Interventions: Evaluation of current treatment plan related to anxiety and sinuses  and patient's adherence to plan as established by provider. 10-29-2021: The patient states that for the most part she is doing okay but still gets anxious and having a lot of issues with her sinuses with the heat. Education and support provided. Review of available resources. Advised patient to provide appropriate vaccination information to provider or CM team member at next visit Advised patient to call the office for changes in her sinuses and needing an earlier appointment and also to talk to the pcp about her klonopine and her concerns with months that have 31 days and sometimes she has withdrawal sx and sx she sometimes experiences Provided education to patient re: doing activities that keep her calm, working through periods of anxiety, monitoring for weather changes that impact her sinuses Reviewed medications with patient and discussed compliance and discussing Klonopine with her pcp Provided patient with mindfulness educational materials related to helping with anxiety and depression Reviewed scheduled/upcoming provider appointments including 01-14-2022 at 1030 am Discussed plans with patient for ongoing care management follow up and provided patient with direct contact information for care management team Advised patient to discuss questions, concerns, changes in chronic conditions with provider Screening for signs and symptoms of depression related to chronic disease state  Assessed social determinant of health barriers AWV completed on 06-30-2021            Our next appointment is by telephone on  01-14-2022 at 1030 am  Please call the care guide team at [phone] if you need to cancel or reschedule your appointment.   If you are experiencing a Mental Health or Behavioral Health Crisis or need someone to talk to, please call the Suicide and Crisis Lifeline: 988 call the [place] National Suicide Prevention Lifeline: 956-721-8264 or TTY: 845-657-1522 TTY (678)753-7701) to talk to a trained counselor call 1-800-273-TALK (toll free, 24 hour hotline)  The patient verbalized understanding of instructions, educational materials, and care plan provided today and DECLINED offer to receive copy of patient instructions, educational materials, and care plan.   Telephone follow up appointment with care management team member scheduled for: 01-14-2022 at 1030 am  Alto Denver RN, MSN, CCM [facility] Coordinator [facility]  [facility] Mobile: [phone]

## 2021-10-29 NOTE — Patient Outreach (Signed)
  Care Coordination   Follow Up Visit Note   10/29/2021 Name: Katrina Sutton MRN: [id number] DOB: 1950/07/07  Katrina Sutton is a 71 y.o. year old female who sees [place], Salvadore Oxford, NP for primary care. I spoke with  Katrina Sutton by phone today  What matters to the patients health and wellness today?  Concerns about her sinuses and anxiety at times. Wants to talk to pcp about her Klonopin and months that have 31 days in them.     Goals Addressed             This Visit's Progress    RNCM: Effective Management of Anxiety       Care Coordination Interventions: Evaluation of current treatment plan related to anxiety and sinuses  and patient's adherence to plan as established by provider. 10-29-2021: The patient states that for the most part she is doing okay but still gets anxious and having a lot of issues with her sinuses with the heat. Education and support provided. Review of available resources. Advised patient to provide appropriate vaccination information to provider or CM team member at next visit Advised patient to call the office for changes in her sinuses and needing an earlier appointment and also to talk to the pcp about her klonopine and her concerns with months that have 31 days and sometimes she has withdrawal sx and sx she sometimes experiences Provided education to patient re: doing activities that keep her calm, working through periods of anxiety, monitoring for weather changes that impact her sinuses Reviewed medications with patient and discussed compliance and discussing Klonopine with her pcp Provided patient with mindfulness educational materials related to helping with anxiety and depression Reviewed scheduled/upcoming provider appointments including 01-14-2022 at 1030 am Discussed plans with patient for ongoing care management follow up and provided patient with direct contact information for care management team Advised patient to discuss questions, concerns, changes in  chronic conditions with provider Screening for signs and symptoms of depression related to chronic disease state  Assessed social determinant of health barriers AWV completed on 06-30-2021            SDOH assessments and interventions completed:  Yes  SDOH Interventions Today    Flowsheet Row Most Recent Value  SDOH Interventions   Food Insecurity Interventions Intervention Not Indicated  Housing Interventions Intervention Not Indicated  Transportation Interventions Intervention Not Indicated        Care Coordination Interventions Activated:  Yes  Care Coordination Interventions:  Yes, provided   Follow up plan: Follow up call scheduled for 01-14-2022 at 1030 am    Encounter Outcome:  Pt. Visit Completed   Alto Denver RN, MSN, CCM Community Care Coordinator [facility]  [facility] Mobile: [phone]

## 2021-11-02 ENCOUNTER — Other Ambulatory Visit: Payer: Self-pay | Admitting: Internal Medicine

## 2021-11-03 NOTE — Telephone Encounter (Signed)
Requested medication (s) are due for refill today: yes  Requested medication (s) are on the active medication list: yes  Last refill:  10/06/21 #90/0  Future visit scheduled: yes  Notes to clinic:  Unable to refill per protocol, cannot delegate.    Requested Prescriptions  Pending Prescriptions Disp Refills   clonazePAM (KLONOPIN) 0.5 MG tablet [Pharmacy Med Name: CLONAZEPAM 0.5 MG TAB] 90 tablet     Sig: TAKE 1 TABLET BY MOUTH 3 TIMES A DAY     Not Delegated - Psychiatry: Anxiolytics/Hypnotics 2 Failed - 11/02/2021 12:26 PM      Failed - This refill cannot be delegated      Failed - Urine Drug Screen completed in last 360 days      Passed - Patient is not pregnant      Passed - Valid encounter within last 6 months    Recent Outpatient Visits           3 months ago Aortic atherosclerosis (HCC)   [facility], Salvadore Oxford, NP   9 months ago Medicare annual wellness visit, subsequent   [facility], Salvadore Oxford, NP   1 year ago Arthritis   [facility] [place], Salvadore Oxford, NP   1 year ago Depression with anxiety   [facility], Jodelle Gross, FNP   1 year ago Essential hypertension   [facility], Jodelle Gross, FNP       Future Appointments             In 2 months Baity, Salvadore Oxford, NP [facility], [facility]

## 2021-11-24 ENCOUNTER — Ambulatory Visit: Payer: Self-pay

## 2021-11-24 NOTE — Patient Outreach (Signed)
  Care Coordination   Follow Up Visit Note   11/24/2021 Name: Katrina Sutton MRN: [id number] DOB: 1950/04/10  Katrina Sutton is a 71 y.o. year old female who sees Katrina Sutton, Katrina Oxford, NP for primary care. I spoke with  Katrina Sutton by phone today.  What matters to the patients health and wellness today?  The patient does not feel her Klonopin refill is as effective as it has been and wanted to know if it is possible that something had changed.    Goals Addressed             This Visit's Progress    RNCM: Effective Management of Anxiety       Care Coordination Interventions: Evaluation of current treatment plan related to anxiety and sinuses  and patient's adherence to plan as established by provider. Incoming call, with VM with the patient asking for a call back. The patient is asking for assistance with medication help. She says that since her refills this time they have given her a different generic and it is not working as well and she has never had issues before with it. Education and support given. Will collaborate with the pharm D. Also let the patient know about an over the counter natural medication called "stress calm" that she may consider getting and trying.  Advised patient to provide appropriate vaccination information to provider or CM team member at next visit Advised patient to call the office for changes in her sinuses and needing an earlier appointment and also to talk to the pcp about her klonopine and her concerns with months that have 31 days and sometimes she has withdrawal sx and sx she sometimes experiences Provided education to patient re: doing activities that keep her calm, working through periods of anxiety, monitoring for weather changes that impact her sinuses Reviewed medications with patient and discussed compliance and discussing Klonopine with her pcp Provided patient with mindfulness educational materials related to helping with anxiety and depression Reviewed  scheduled/upcoming provider appointments including 01-14-2022 at 1030 am Discussed plans with patient for ongoing care management follow up and provided patient with direct contact information for care management team Pharmacy consult for help with concerns about her Klonopin generic medication not being as effective as past refills  Advised patient to discuss questions, concerns, changes in chronic conditions with provider Screening for signs and symptoms of depression related to chronic disease state  Assessed social determinant of health barriers AWV completed on 06-30-2021            SDOH assessments and interventions completed:  Yes  SDOH Interventions Today    Flowsheet Row Most Recent Value  SDOH Interventions   Utilities Interventions Intervention Not Indicated  Financial Strain Interventions Intervention Not Indicated  Stress Interventions Intervention Not Indicated        Care Coordination Interventions Activated:  Yes  Care Coordination Interventions:  Yes, provided   Follow up plan: Follow up call scheduled for 01-14-2022 at 1030 am    Encounter Outcome:  Pt. Visit Completed   Alto Denver RN, MSN, CCM Community Care Coordinator [facility]  [facility] Mobile: [phone]

## 2021-11-24 NOTE — Patient Instructions (Signed)
Visit Information  Thank you for taking time to visit with me today. Please don't hesitate to contact me if I can be of assistance to you.   Following are the goals we discussed today:   Goals Addressed             This Visit's Progress    RNCM: Effective Management of Anxiety       Care Coordination Interventions: Evaluation of current treatment plan related to anxiety and sinuses  and patient's adherence to plan as established by provider. Incoming call, with VM with the patient asking for a call back. The patient is asking for assistance with medication help. She says that since her refills this time they have given her a different generic and it is not working as well and she has never had issues before with it. Education and support given. Will collaborate with the pharm D. Also let the patient know about an over the counter natural medication called "stress calm" that she may consider getting and trying.  Advised patient to provide appropriate vaccination information to provider or CM team member at next visit Advised patient to call the office for changes in her sinuses and needing an earlier appointment and also to talk to the pcp about her klonopine and her concerns with months that have 31 days and sometimes she has withdrawal sx and sx she sometimes experiences Provided education to patient re: doing activities that keep her calm, working through periods of anxiety, monitoring for weather changes that impact her sinuses Reviewed medications with patient and discussed compliance and discussing Klonopine with her pcp Provided patient with mindfulness educational materials related to helping with anxiety and depression Reviewed scheduled/upcoming provider appointments including 01-14-2022 at 1030 am Discussed plans with patient for ongoing care management follow up and provided patient with direct contact information for care management team Pharmacy consult for help with concerns about  her Klonopin generic medication not being as effective as past refills  Advised patient to discuss questions, concerns, changes in chronic conditions with provider Screening for signs and symptoms of depression related to chronic disease state  Assessed social determinant of health barriers AWV completed on 06-30-2021            Our next appointment is by telephone on 01-14-2022 at 1030 am  Please call the care guide team at [phone] if you need to cancel or reschedule your appointment.   If you are experiencing a Mental Health or Behavioral Health Crisis or need someone to talk to, please call the Suicide and Crisis Lifeline: 988 call the [place] National Suicide Prevention Lifeline: (507) 050-6602 or TTY: 703-120-0765 TTY 734-265-3469) to talk to a trained counselor call 1-800-273-TALK (toll free, 24 hour hotline)  The patient verbalized understanding of instructions, educational materials, and care plan provided today and DECLINED offer to receive copy of patient instructions, educational materials, and care plan.   Telephone follow up appointment with care management team member scheduled for: 01-14-2022 at 1030 am  Alto Denver RN, MSN, CCM [facility] Coordinator [facility]  [facility] Mobile: [phone]

## 2021-11-25 ENCOUNTER — Ambulatory Visit: Payer: Medicare HMO | Admitting: Pharmacist

## 2021-11-25 DIAGNOSIS — F419 Anxiety disorder, unspecified: Secondary | ICD-10-CM

## 2021-11-25 NOTE — Chronic Care Management (AMB) (Signed)
    11/25/2021 Name: Katrina Sutton MRN: [id number] DOB: Dec 24, 1950  I connected with Laurelai T Martensen on 11/25/21 by telephone outreach and verified that I am speaking with the correct person using two identifiers.  Receive message from [facility] Nurse Case Manager. RN requests outreach to patient for question regarding her clonazepam medication.  Outreached patient to discuss medication management/address medication questions.   Outpatient Encounter Medications as of 11/25/2021  Medication Sig Note   acetaminophen (TYLENOL) 500 MG tablet Take 500 mg by mouth every 8 (eight) hours as needed for headache.    aspirin 81 MG EC tablet Take 1 tablet (81 mg total) by mouth daily. Swallow whole.    clonazePAM (KLONOPIN) 0.5 MG tablet TAKE 1 TABLET BY MOUTH 3 TIMES A DAY    hydrocortisone cream 1 % Apply 1 application topically 2 (two) times daily as needed for itching.  06/19/2019: Very seldom    pantoprazole (PROTONIX) 40 MG tablet Take 1 tablet (40 mg total) by mouth daily.    PARoxetine (PAXIL) 40 MG tablet Take 1 tablet (40 mg total) by mouth daily.    polyethylene glycol (MIRALAX / GLYCOLAX) 17 g packet Take 17 g by mouth daily.    No facility-administered encounter medications on file as of 11/25/2021.    Lab Results  Component Value Date   CREATININE 1.05 (H) 07/09/2021   BUN 12 07/09/2021   NA 139 07/09/2021   K 4.0 07/09/2021   CL 104 07/09/2021   CO2 28 07/09/2021   Anxiety: Current treatment: -paroxetine 40 mg daily -clonazepam 0.5 mg three times daily as needed  Patient reports since picked up latest refill of clonazepam (different generic brand), does not feel like it works as well as before. Reports previously has been on the same generic brand of clonazepam for years.  Reports has contacted her pharmacy to request to receive previous brand name again for upcoming refill in September. Reports working on reducing caffeine intake  Reports interested in referral to establish care with a  psychiatrist and counselor Plans to follow up with PCP regarding referral at upcoming appointment on 10/27 Will send message to PCP  Assessment/Plan: Patient to contact her [facility] Medicare to find out what providers are in network Patient to follow up with PCP at upcoming appointment as scheduled on 10/27   Follow Up Plan:  Patient denies further medication questions or concerns today Provide patient with contact information for clinic pharmacist to contact if needed in future for medication questions/concerns   Estelle Grumbles, PharmD, [facility] [phone]

## 2021-11-26 NOTE — Patient Instructions (Signed)
Thank you allowing the Care Management Team to be a part of your care! It was a pleasure speaking with you today!     Care Management Team    Alto Denver RN, MSN, CCM Nurse Care Coordinator  [phone]   Estelle Grumbles, PharmD  Clinical Pharmacist  [phone]   Verna Czech, LCSW Clinical Social Worker [phone]   Visit Information   Goals Addressed             This Visit's Progress    Pharmacy Goal       Please let me know if you have additional questions or concerns about your medications in the future  Estelle Grumbles, PharmD, BCACP Clinical Pharmacist [facility] [phone]         The patient verbalized understanding of instructions, educational materials, and care plan provided today and DECLINED offer to receive copy of patient instructions, educational materials, and care plan.   No further follow up required: medication questions addressed

## 2021-11-30 ENCOUNTER — Other Ambulatory Visit: Payer: Self-pay | Admitting: Internal Medicine

## 2021-12-01 NOTE — Telephone Encounter (Signed)
Requested medication (s) are due for refill today: no  Requested medication (s) are on the active medication list: yes  Last refill:  11/03/21  Future visit scheduled: yes  Notes to clinic:  Unable to refill per protocol, cannot delegate.      Requested Prescriptions  Pending Prescriptions Disp Refills   clonazePAM (KLONOPIN) 0.5 MG tablet [Pharmacy Med Name: CLONAZEPAM 0.5 MG TAB] 90 tablet     Sig: TAKE 1 TABLET BY MOUTH 3 TIMES A DAY     Not Delegated - Psychiatry: Anxiolytics/Hypnotics 2 Failed - 11/30/2021  9:21 AM      Failed - This refill cannot be delegated      Failed - Urine Drug Screen completed in last 360 days      Passed - Patient is not pregnant      Passed - Valid encounter within last 6 months    Recent Outpatient Visits           6 days ago [place], RPH-CPP   4 months ago Aortic atherosclerosis [facility])   [facility], Coralie Keens, NP   10 months ago Medicare annual wellness visit, subsequent   [facility], Coralie Keens, NP   1 year ago [facility] [place], Coralie Keens, NP   1 year ago Depression with anxiety   [facility], Lupita Raider, FNP       Future Appointments             In 1 month Baity, Coralie Keens, NP [facility], [facility]

## 2021-12-25 ENCOUNTER — Other Ambulatory Visit: Payer: Self-pay | Admitting: Internal Medicine

## 2021-12-25 NOTE — Telephone Encounter (Signed)
Requested medication (s) are due for refill today: no  Requested medication (s) are on the active medication list: yes  Last refill:  12/01/21  Future visit scheduled: yes  Notes to clinic:  Unable to refill per protocol, cannot delegate.      Requested Prescriptions  Pending Prescriptions Disp Refills   clonazePAM (KLONOPIN) 0.5 MG tablet [Pharmacy Med Name: CLONAZEPAM 0.5 MG TAB] 90 tablet     Sig: TAKE 1 TABLET BY MOUTH 3 TIMES A DAY     Not Delegated - Psychiatry: Anxiolytics/Hypnotics 2 Failed - 12/25/2021  1:46 PM      Failed - This refill cannot be delegated      Failed - Urine Drug Screen completed in last 360 days      Passed - Patient is not pregnant      Passed - Valid encounter within last 6 months    Recent Outpatient Visits           1 month ago [place], RPH-CPP   5 months ago Aortic atherosclerosis [facility])   [facility] [place], Coralie Keens, NP   11 months ago Medicare annual wellness visit, subsequent   [facility] [place], Coralie Keens, NP   1 year ago [facility] [place], Coralie Keens, NP   1 year ago Depression with anxiety   [facility], Lupita Raider, FNP       Future Appointments             In 2 weeks Garnette Gunner, Coralie Keens, NP [facility], [facility]

## 2022-01-07 ENCOUNTER — Other Ambulatory Visit: Payer: Self-pay | Admitting: Internal Medicine

## 2022-01-07 DIAGNOSIS — E7800 Pure hypercholesterolemia, unspecified: Secondary | ICD-10-CM

## 2022-01-07 DIAGNOSIS — I700 Atherosclerosis of aorta: Secondary | ICD-10-CM

## 2022-01-07 DIAGNOSIS — F411 Generalized anxiety disorder: Secondary | ICD-10-CM

## 2022-01-07 DIAGNOSIS — I10 Essential (primary) hypertension: Secondary | ICD-10-CM

## 2022-01-07 DIAGNOSIS — K219 Gastro-esophageal reflux disease without esophagitis: Secondary | ICD-10-CM

## 2022-01-07 DIAGNOSIS — F319 Bipolar disorder, unspecified: Secondary | ICD-10-CM

## 2022-01-07 NOTE — Progress Notes (Signed)
am

## 2022-01-08 ENCOUNTER — Encounter: Payer: Self-pay | Admitting: Internal Medicine

## 2022-01-08 ENCOUNTER — Ambulatory Visit (INDEPENDENT_AMBULATORY_CARE_PROVIDER_SITE_OTHER): Payer: Medicare HMO | Admitting: Internal Medicine

## 2022-01-08 VITALS — BP 142/94 | HR 110 | Temp 96.6°F | Ht 64.0 in | Wt 155.0 lb

## 2022-01-08 DIAGNOSIS — Z79899 Other long term (current) drug therapy: Secondary | ICD-10-CM

## 2022-01-08 DIAGNOSIS — Z0001 Encounter for general adult medical examination with abnormal findings: Secondary | ICD-10-CM

## 2022-01-08 DIAGNOSIS — E7800 Pure hypercholesterolemia, unspecified: Secondary | ICD-10-CM | POA: Diagnosis not present

## 2022-01-08 DIAGNOSIS — E663 Overweight: Secondary | ICD-10-CM

## 2022-01-08 DIAGNOSIS — I10 Essential (primary) hypertension: Secondary | ICD-10-CM | POA: Diagnosis not present

## 2022-01-08 DIAGNOSIS — Z6826 Body mass index (BMI) 26.0-26.9, adult: Secondary | ICD-10-CM

## 2022-01-08 MED ORDER — LISINOPRIL 10 MG PO TABS: 10.0000 mg | ORAL_TABLET | Freq: Every day | ORAL | 1 refills | Status: DC

## 2022-01-08 NOTE — Patient Instructions (Signed)

## 2022-01-08 NOTE — Assessment & Plan Note (Signed)
Encouraged diet and exercise for weight loss ?
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
## 2022-01-08 NOTE — Assessment & Plan Note (Addendum)
We will trial lisinopril 10 mg daily Reinforced DASH and exercise for weight loss

## 2022-01-09 LAB — CBC
HCT: 42.1 % (ref 35.0–45.0)
Hemoglobin: 14.5 g/dL (ref 11.7–15.5)
MCH: 30 pg (ref 27.0–33.0)
MCHC: 34.4 g/dL (ref 32.0–36.0)
MCV: 87.2 fL (ref 80.0–100.0)
MPV: 9.3 fL (ref 7.5–12.5)
Platelets: 254 10*3/uL (ref 140–400)
RBC: 4.83 10*6/uL (ref 3.80–5.10)
RDW: 13.4 % (ref 11.0–15.0)
WBC: 7.1 10*3/uL (ref 3.8–10.8)

## 2022-01-09 LAB — COMPLETE METABOLIC PANEL WITH GFR
AG Ratio: 1.7 (calc) (ref 1.0–2.5)
ALT: 10 U/L (ref 6–29)
AST: 16 U/L (ref 10–35)
Albumin: 4 g/dL (ref 3.6–5.1)
Alkaline phosphatase (APISO): 113 U/L (ref 37–153)
BUN/Creatinine Ratio: 7 (calc) (ref 6–22)
BUN: 8 mg/dL (ref 7–25)
CO2: 25 mmol/L (ref 20–32)
Calcium: 9 mg/dL (ref 8.6–10.4)
Chloride: 105 mmol/L (ref 98–110)
Creat: 1.16 mg/dL — ABNORMAL HIGH (ref 0.60–1.00)
Globulin: 2.4 g/dL (calc) (ref 1.9–3.7)
Glucose, Bld: 101 mg/dL (ref 65–139)
Potassium: 3.9 mmol/L (ref 3.5–5.3)
Sodium: 139 mmol/L (ref 135–146)
Total Bilirubin: 1 mg/dL (ref 0.2–1.2)
Total Protein: 6.4 g/dL (ref 6.1–8.1)
eGFR: 50 mL/min/{1.73_m2} — ABNORMAL LOW (ref >=60)

## 2022-01-09 LAB — DRUG MONITORING, PANEL 8 WITH CONFIRMATION, URINE
6 Acetylmorphine: NEGATIVE ng/mL (ref <10)
Alcohol Metabolites: NEGATIVE ng/mL (ref <500)
Amphetamines: NEGATIVE ng/mL (ref <500)
Benzodiazepines: NEGATIVE ng/mL (ref <100)
Buprenorphine, Urine: NEGATIVE ng/mL (ref <5)
Cocaine Metabolite: NEGATIVE ng/mL (ref <150)
Creatinine: 17.7 mg/dL — ABNORMAL LOW (ref >=20.0)
MDMA: NEGATIVE ng/mL (ref <500)
Marijuana Metabolite: NEGATIVE ng/mL (ref <20)
Opiates: NEGATIVE ng/mL (ref <100)
Oxidant: NEGATIVE ug/mL (ref <200)
Oxycodone: NEGATIVE ng/mL (ref <100)
Specific Gravity: 1.002 — ABNORMAL LOW (ref >=1.003)
pH: 7.1 (ref 4.5–9.0)

## 2022-01-09 LAB — LIPID PANEL
Cholesterol: 180 mg/dL (ref <200)
HDL: 55 mg/dL (ref >=50)
LDL Cholesterol (Calc): 104 mg/dL (calc) — ABNORMAL HIGH
Non-HDL Cholesterol (Calc): 125 mg/dL (calc) (ref <130)
Total CHOL/HDL Ratio: 3.3 (calc) (ref <5.0)
Triglycerides: 111 mg/dL (ref <150)

## 2022-01-09 LAB — DM TEMPLATE

## 2022-01-11 ENCOUNTER — Ambulatory Visit: Payer: Self-pay | Admitting: *Deleted

## 2022-01-11 NOTE — Telephone Encounter (Signed)
  Chief Complaint: low BP since starting lisinopril 10 mg on Friday 01/08/22 Symptoms: BP 94/77 pulse 70 then checked for 87/71 pulse 69 now . Patient reports starting lisinopril 10 mg and takes in am with breakfast. Denies dizziness lightheaded. No decrease balance no chest pain. Reports feeling sleepy in afternoons Frequency: today  Pertinent Negatives: Patient denies chest pain no difficulty breathing no feeling of passing out Disposition: [] ED /[] Urgent Care (no appt availability in office) / [] Appointment(In office/virtual)/ []  [facility]/ [] Home Care/ [] Refused Recommended Disposition /[] [facility]/ [x]  Follow-up with PCP Additional Notes:   Recommended to drink fluids and call back if BP remains low. Patient would like a call back if ED/ UC recommended.  Please advise if need to decrease medication.     Reason for Disposition  [3] Systolic BP 79-024 AND [0] taking blood pressure medications AND [3] NOT dizzy, lightheaded or weak  Answer Assessment - Initial Assessment Questions 1. BLOOD PRESSURE: "What is the blood pressure?" "Did you take at least two measurements 5 minutes apart?"     *No Answer* 2. ONSET: "When did you take your blood pressure?"     *No Answer* 3. HOW: "How did you obtain the blood pressure?" (e.g., visiting nurse, automatic home BP monitor)     *No Answer* 4. HISTORY: "Do you have a history of low blood pressure?" "What is your blood pressure normally?"     *No Answer* 5. MEDICINES: "Are you taking any medications for blood pressure?" If Yes, ask: "Have they been changed recently?"     *No Answer* 6. PULSE RATE: "Do you know what your pulse rate is?"      *No Answer* 7. OTHER SYMPTOMS: "Have you been sick recently?" "Have you had a recent injury?"     *No Answer* 8. PREGNANCY: "Is there any chance you are pregnant?" "When was your last menstrual period?"     *No Answer*  Protocols used: Blood Pressure - Low-A-AH

## 2022-01-12 NOTE — Telephone Encounter (Signed)
LMTCB 01/12/2022.  PEC please advise pt and also review her lab results.   Thanks,   -Mickel Baas

## 2022-01-13 ENCOUNTER — Telehealth: Payer: Self-pay | Admitting: *Deleted

## 2022-01-13 NOTE — Telephone Encounter (Signed)
Patient returned call for provider response to lab results and informed: Nothing that she eats or drinks will interfere with the presence of benzodiazepines being in her urine.  The fact that she takes it 3 times a day and there is no trace of clonazepam and her system is concerning.  The urine sample was also very dilute.  She can make an appointment to discuss this further if she would like but at this point I will be unable to refill her clonazepam.  Patient states she is not sure why her results were negative- she does take her medication as prescribed and she does need it for her nerves. She did speak to pharmacist and was advised to let provider know she does take Miralax at the same time as Clonazepam. Offered to make patient appointment- she does have an appointment already scheduled- she will keep that appointment and discuss with provider then.

## 2022-01-13 NOTE — Telephone Encounter (Signed)
Patient called back to get respose from provider regarding her labs- see lab notes. Patient stated she was waiting on response regarding her low BP readings. Patient states she did not take her BP medication(yesterday and today) and her BP is now back up- 111/76 P 80. Patient states she normally takes BP medication in am. Advised I will send note to provider for follow up response- patient advised she may not need to totally stop her BP medication- her BP may continue to rise- will get instruction from provider.( Patient does have appointment 11/10 and plans to keep that appointment to discuss medication/labs)

## 2022-01-14 ENCOUNTER — Ambulatory Visit (INDEPENDENT_AMBULATORY_CARE_PROVIDER_SITE_OTHER): Payer: Medicare HMO

## 2022-01-14 ENCOUNTER — Telehealth: Payer: Medicare HMO

## 2022-01-14 DIAGNOSIS — I10 Essential (primary) hypertension: Secondary | ICD-10-CM

## 2022-01-14 DIAGNOSIS — F419 Anxiety disorder, unspecified: Secondary | ICD-10-CM

## 2022-01-14 DIAGNOSIS — K219 Gastro-esophageal reflux disease without esophagitis: Secondary | ICD-10-CM

## 2022-01-14 DIAGNOSIS — F319 Bipolar disorder, unspecified: Secondary | ICD-10-CM

## 2022-01-14 NOTE — Patient Instructions (Signed)
Please call the care guide team at [phone] if you need to cancel or reschedule your appointment.   If you are experiencing a Mental Health or [place] or need someone to talk to, please call the Suicide and Crisis Lifeline: 988 call the [place] National Suicide Prevention Lifeline: 214-139-9404 or TTY: 206 684 6205 TTY 260-024-9170) to talk to a trained counselor call 1-800-273-TALK (toll free, 24 hour hotline)   Following is a copy of your full provider care plan:   Goals Addressed             This Visit's Progress    CCM Expected Outcome:  Monitor, Self-Manage and Reduce Symptoms of: GERD       Current Barriers:  Knowledge Deficits related to how to control triggers that cause exacerbation of GERD Care Coordination needs related to resources and ongoing education needs  in a patient with GERD Chronic Disease Management support and education needs related to Effective management of GERD Lacks caregiver support.   Planned Interventions: Evaluation of current treatment plan related to GERD and patient's adherence to plan as established by provider Advised patient to call the office for changes in her sx and sx of GERD Provided education to patient re: foods to avoid that will cause exacerbation, monitoring for changes, keeping stress levels down, and notifying the provider for changes in her condition Reviewed medications with patient and discussed compliance, the patient states compliance with medications Reviewed scheduled/upcoming provider appointments including 01-22-2022 at 2 pm Discussed plans with patient for ongoing care management follow up and provided patient with direct contact information for care management team Advised patient to discuss changes in her GERD, questions or concerns with provider Screening for signs and symptoms of depression related to chronic disease state  Assessed social determinant of health barriers  Symptom Management: Take  medications as prescribed   Attend all scheduled provider appointments Call provider office for new concerns or questions  call the Suicide and Crisis Lifeline: 988 call the [place] National Suicide Prevention Lifeline: (859)219-4359 or TTY: (203)276-3467 TTY (669)395-6929) to talk to a trained counselor call 1-800-273-TALK (toll free, 24 hour hotline) if experiencing a Mental Health or [place]   Follow Up Plan: Telephone follow up appointment with care management team member scheduled for: 03-11-2022 at 1145 am       CCM Expected Outcome:  Monitor, Self-Manage and Reduce Symptoms NO:[id number] disorder and anxiety       Current Barriers:  Knowledge Deficits related to not understanding why her urine test did not show positive for her klonopin as she takes it as directed.  Care Coordination needs related to ongoing education and support needed  in a patient with bipolar disorder and anxiety Chronic Disease Management support and education needs related to effective management of bipolar disorder and anxiety Lacks caregiver support.   Planned Interventions: Evaluation of current treatment plan related to bipolar disorder and anxiety and patient's adherence to plan as established by provider Advised patient to call the office for changes in her mood, anxiety, depression or mental health needs  Provided education to patient re: discussing her concerns about the validity of her urine test as it did not show that she was taking her klonopin. The patient does not understand why this happened.  Reviewed medications with patient and discussed compliance. The patient endorses compliance with medications  Reviewed scheduled/upcoming provider appointments including 01-22-2022 at 2 pm Discussed plans with patient for ongoing care management follow up and provided patient with direct contact  information for care management team Advised patient to discuss changes in her mental health needs with  provider Screening for signs and symptoms of depression related to chronic disease state  Assessed social determinant of health barriers  Symptom Management: Take medications as prescribed   Attend all scheduled provider appointments Call provider office for new concerns or questions  Work with the social worker to address care coordination needs and will continue to work with the clinical team to address health care and disease management related needs call the Suicide and Crisis Lifeline: 988 call the [place] National Suicide Prevention Lifeline: 815-763-7137 or TTY: 847-340-4312 TTY 806-024-7219) to talk to a trained counselor call 1-800-273-TALK (toll free, 24 hour hotline) if experiencing a Mental Health or Behavioral Health Crisis   Follow Up Plan: Telephone follow up appointment with care management team member scheduled for: 03-11-2022 at 1145 am       CCM Expected Outcome:  Monitor, Self-Manage, and Reduce Symptoms of Hypertension       Current Barriers:  Knowledge Deficits related to controlling blood pressures and monitoring for factors that stress her out and increase her blood pressures Care Coordination needs related to patient dealing with some hypotension since starting medications and not knowing when to take medications or not in a patient with HTN Chronic Disease Management support and education needs related to effective management of HTN Lacks caregiver support.   Planned Interventions: Evaluation of current treatment plan related to hypertension self management and patient's adherence to plan as established by provider;   Provided education to patient re: stroke prevention, s/s of heart attack and stroke; Reviewed prescribed diet heart healthy diet  Reviewed medications with patient and discussed importance of compliance;  Discussed plans with patient for ongoing care management follow up and provided patient with direct contact information for care management  team; Advised patient, providing education and rationale, to monitor blood pressure daily and record, calling PCP for findings outside established parameters;  Reviewed scheduled/upcoming provider appointments including:  Advised patient to discuss changes in her blood pressures, parameters of when to hold her medications and other questions or concerns about heart healthy with provider; Provided education on prescribed diet heart healthy;  Discussed complications of poorly controlled blood pressure such as heart disease, stroke, circulatory complications, vision complications, kidney impairment, sexual dysfunction;  Screening for signs and symptoms of depression related to chronic disease state;  Assessed social determinant of health barriers;  The patient has been having some hypotension since starting blood pressure medications. She has stopped on her own with taking the medications as she felt her blood pressure was too low. She states her blood pressure only goes up if she is stressed.   Symptom Management: Take medications as prescribed   Attend all scheduled provider appointments Call pharmacy for medication refills 3-7 days in advance of running out of medications Call provider office for new concerns or questions  call the Suicide and Crisis Lifeline: 988 call the [place] National Suicide Prevention Lifeline: (458)840-8150 or TTY: 817-803-4374 TTY 509 326 2382) to talk to a trained counselor call 1-800-273-TALK (toll free, 24 hour hotline) if experiencing a Mental Health or Behavioral Health Crisis  check blood pressure 3 times per week learn about high blood pressure keep a blood pressure log take blood pressure log to all doctor appointments call doctor for signs and symptoms of high blood pressure develop an action plan for high blood pressure keep all doctor appointments take medications for blood pressure exactly as prescribed report new symptoms  to your doctor  Follow Up  Plan: Telephone follow up appointment with care management team member scheduled for: 03-11-2022 at 1145 am       COMPLETED: RNCM: Effective Management of Anxiety       Care Coordination Interventions:closing this goal and opening in new CCM Evaluation of current treatment plan related to anxiety and sinuses  and patient's adherence to plan as established by provider. Incoming call, with VM with the patient asking for a call back. The patient is asking for assistance with medication help. She says that since her refills this time they have given her a different generic and it is not working as well and she has never had issues before with it. Education and support given. Will collaborate with the pharm D. Also let the patient know about an over the counter natural medication called "stress calm" that she may consider getting and trying.  Advised patient to provide appropriate vaccination information to provider or CM team member at next visit Advised patient to call the office for changes in her sinuses and needing an earlier appointment and also to talk to the pcp about her klonopine and her concerns with months that have 31 days and sometimes she has withdrawal sx and sx she sometimes experiences Provided education to patient re: doing activities that keep her calm, working through periods of anxiety, monitoring for weather changes that impact her sinuses Reviewed medications with patient and discussed compliance and discussing Klonopine with her pcp Provided patient with mindfulness educational materials related to helping with anxiety and depression Reviewed scheduled/upcoming provider appointments including 01-14-2022 at 1030 am Discussed plans with patient for ongoing care management follow up and provided patient with direct contact information for care management team Pharmacy consult for help with concerns about her Klonopin generic medication not being as effective as past refills  Advised  patient to discuss questions, concerns, changes in chronic conditions with provider Screening for signs and symptoms of depression related to chronic disease state  Assessed social determinant of health barriers AWV completed on 06-30-2021            The patient verbalized understanding of instructions, educational materials, and care plan provided today and DECLINED offer to receive copy of patient instructions, educational materials, and care plan.   Telephone follow up appointment with care management team member scheduled for: 03-11-2022 at 1145 am

## 2022-01-14 NOTE — Telephone Encounter (Signed)
If BP is consistently < 130/80, then she does not need BP meds. Continue to monitor BP at home and notify me if it increases.

## 2022-01-14 NOTE — Chronic Care Management (AMB) (Signed)
Chronic Care Management   CCM RN Visit Note  01/14/2022 Name: Katrina Sutton MRN: [id number] DOB: 11/10/1950  Subjective: Katrina Sutton is a 71 y.o. year old female who is a primary care patient of Jearld Fenton, NP. The patient was referred to the Chronic Care Management team for assistance with care management needs subsequent to provider initiation of CCM services and plan of care.    Today's Visit:  Engaged with patient by telephone for initial visit.        Goals Addressed             This Visit's Progress    CCM Expected Outcome:  Monitor, Self-Manage and Reduce Symptoms of: GERD       Current Barriers:  Knowledge Deficits related to how to control triggers that cause exacerbation of GERD Care Coordination needs related to resources and ongoing education needs  in a patient with GERD Chronic Disease Management support and education needs related to Effective management of GERD Lacks caregiver support.   Planned Interventions: Evaluation of current treatment plan related to GERD and patient's adherence to plan as established by provider Advised patient to call the office for changes in her sx and sx of GERD Provided education to patient re: foods to avoid that will cause exacerbation, monitoring for changes, keeping stress levels down, and notifying the provider for changes in her condition Reviewed medications with patient and discussed compliance, the patient states compliance with medications Reviewed scheduled/upcoming provider appointments including 01-22-2022 at 2 pm Discussed plans with patient for ongoing care management follow up and provided patient with direct contact information for care management team Advised patient to discuss changes in her GERD, questions or concerns with provider Screening for signs and symptoms of depression related to chronic disease state  Assessed social determinant of health barriers  Symptom Management: Take medications as prescribed    Attend all scheduled provider appointments Call provider office for new concerns or questions  call the Suicide and Crisis Lifeline: 988 call the [place] National Suicide Prevention Lifeline: 3852713688 or TTY: 919 279 8389 TTY [phone]) to talk to a trained counselor call 1-800-273-TALK (toll free, 24 hour hotline) if experiencing a Mental Health or [place]   Follow Up Plan: Telephone follow up appointment with care management team member scheduled for: 03-11-2022 at 1145 am       CCM Expected Outcome:  Monitor, Self-Manage and Reduce Symptoms VO:[id number] disorder and anxiety       Current Barriers:  Knowledge Deficits related to not understanding why her urine test did not show positive for her klonopin as she takes it as directed.  Care Coordination needs related to ongoing education and support needed  in a patient with bipolar disorder and anxiety Chronic Disease Management support and education needs related to effective management of bipolar disorder and anxiety Lacks caregiver support.   Planned Interventions: Evaluation of current treatment plan related to bipolar disorder and anxiety and patient's adherence to plan as established by provider Advised patient to call the office for changes in her mood, anxiety, depression or mental health needs  Provided education to patient re: discussing her concerns about the validity of her urine test as it did not show that she was taking her klonopin. The patient does not understand why this happened.  Reviewed medications with patient and discussed compliance. The patient endorses compliance with medications  Reviewed scheduled/upcoming provider appointments including 01-22-2022 at 2 pm Discussed plans with patient for ongoing care management follow up  and provided patient with direct contact information for care management team Advised patient to discuss changes in her mental health needs with provider Screening for  signs and symptoms of depression related to chronic disease state  Assessed social determinant of health barriers  Symptom Management: Take medications as prescribed   Attend all scheduled provider appointments Call provider office for new concerns or questions  Work with the social worker to address care coordination needs and will continue to work with the clinical team to address health care and disease management related needs call the Suicide and Crisis Lifeline: 988 call the [place] National Suicide Prevention Lifeline: (513)607-4640 or TTY: 2674223332 TTY 952 819 7784) to talk to a trained counselor call 1-800-273-TALK (toll free, 24 hour hotline) if experiencing a Mental Health or [place]   Follow Up Plan: Telephone follow up appointment with care management team member scheduled for: 03-11-2022 at 1145 am       CCM Expected Outcome:  Monitor, Self-Manage, and Reduce Symptoms of Hypertension       Current Barriers:  Knowledge Deficits related to controlling blood pressures and monitoring for factors that stress her out and increase her blood pressures Care Coordination needs related to patient dealing with some hypotension since starting medications and not knowing when to take medications or not in a patient with HTN Chronic Disease Management support and education needs related to effective management of HTN Lacks caregiver support.   Planned Interventions: Evaluation of current treatment plan related to hypertension self management and patient's adherence to plan as established by provider;   Provided education to patient re: stroke prevention, s/s of heart attack and stroke; Reviewed prescribed diet heart healthy diet  Reviewed medications with patient and discussed importance of compliance;  Discussed plans with patient for ongoing care management follow up and provided patient with direct contact information for care management team; Advised patient,  providing education and rationale, to monitor blood pressure daily and record, calling PCP for findings outside established parameters;  Reviewed scheduled/upcoming provider appointments including:  Advised patient to discuss changes in her blood pressures, parameters of when to hold her medications and other questions or concerns about heart healthy with provider; Provided education on prescribed diet heart healthy;  Discussed complications of poorly controlled blood pressure such as heart disease, stroke, circulatory complications, vision complications, kidney impairment, sexual dysfunction;  Screening for signs and symptoms of depression related to chronic disease state;  Assessed social determinant of health barriers;  The patient has been having some hypotension since starting blood pressure medications. She has stopped on her own with taking the medications as she felt her blood pressure was too low. She states her blood pressure only goes up if she is stressed.   Symptom Management: Take medications as prescribed   Attend all scheduled provider appointments Call pharmacy for medication refills 3-7 days in advance of running out of medications Call provider office for new concerns or questions  call the Suicide and Crisis Lifeline: 988 call the [place] National Suicide Prevention Lifeline: 713-667-1775 or TTY: (774)832-7736 TTY (332)423-3983) to talk to a trained counselor call 1-800-273-TALK (toll free, 24 hour hotline) if experiencing a Mental Health or [place]  check blood pressure 3 times per week learn about high blood pressure keep a blood pressure log take blood pressure log to all doctor appointments call doctor for signs and symptoms of high blood pressure develop an action plan for high blood pressure keep all doctor appointments take medications for blood pressure  exactly as prescribed report new symptoms to your doctor  Follow Up Plan: Telephone follow up  appointment with care management team member scheduled for: 03-11-2022 at 1145 am       COMPLETED: RNCM: Effective Management of Anxiety       Care Coordination Interventions:closing this goal and opening in new CCM Evaluation of current treatment plan related to anxiety and sinuses  and patient's adherence to plan as established by provider. Incoming call, with VM with the patient asking for a call back. The patient is asking for assistance with medication help. She says that since her refills this time they have given her a different generic and it is not working as well and she has never had issues before with it. Education and support given. Will collaborate with the pharm D. Also let the patient know about an over the counter natural medication called "stress calm" that she may consider getting and trying.  Advised patient to provide appropriate vaccination information to provider or CM team member at next visit Advised patient to call the office for changes in her sinuses and needing an earlier appointment and also to talk to the pcp about her klonopine and her concerns with months that have 31 days and sometimes she has withdrawal sx and sx she sometimes experiences Provided education to patient re: doing activities that keep her calm, working through periods of anxiety, monitoring for weather changes that impact her sinuses Reviewed medications with patient and discussed compliance and discussing Klonopine with her pcp Provided patient with mindfulness educational materials related to helping with anxiety and depression Reviewed scheduled/upcoming provider appointments including 01-14-2022 at 1030 am Discussed plans with patient for ongoing care management follow up and provided patient with direct contact information for care management team Pharmacy consult for help with concerns about her Klonopin generic medication not being as effective as past refills  Advised patient to discuss questions,  concerns, changes in chronic conditions with provider Screening for signs and symptoms of depression related to chronic disease state  Assessed social determinant of health barriers AWV completed on 06-30-2021            Plan:Telephone follow up appointment with care management team member scheduled for:  03-11-2022 at 1145 am  Noreene Larsson RN, MSN, CCM RN Care Manager  Chronic Care Management Direct Number: [phone]

## 2022-01-14 NOTE — Chronic Care Management (AMB) (Signed)
Chronic Care Management Provider Comprehensive Care Plan    01/14/2022 Name: Katrina Sutton MRN: [id number] DOB: 1950-08-31  Referral to Chronic Care Management (CCM) services was placed by Provider:  [employer] on Date: 01-07-2022.  Chronic Condition 1: Bipolar and Anxiety Provider Assessment and Plan  Stable on Paroxetine and Clonazepam, refilled today Support offered     Expected Outcome/Goals Addressed This Visit (Provider CCM goals/Provider Assessment and plan   CCM Expected Outcome:  Monitor, Self-Manage and Reduce Symptoms of anxiety and bipolar disorder   Symptom Management Condition 1: Take all medications as prescribed Attend all scheduled provider appointments Call provider office for new concerns or questions  Work with the social worker to address care coordination needs and will continue to work with the clinical team to address health care and disease management related needs call the Suicide and Crisis Lifeline: 988 call the [place] National Suicide Prevention Lifeline: 202-590-6494 or TTY: 954-129-9817 TTY [phone]) to talk to a trained counselor call 1-800-273-TALK (toll free, 24 hour hotline) if experiencing a Mental Health or [place] Crisis   Chronic Condition 2: HTN Provider Assessment and Plan We will trial lisinopril 10 mg daily Reinforced DASH and exercise for weight loss   Expected Outcome/Goals Addressed This Visit (Provider CCM goals/Provider Assessment and plan   CCM (HYPERTENSION)  EXPECTED OUTCOME:  MONITOR,SELF- MANAGE AND REDUCE SYMPTOMS OF HYPERTENSION   Symptom Management Condition 2: Take all medications as prescribed Attend all scheduled provider appointments Call provider office for new concerns or questions  call the Suicide and Crisis Lifeline: 988 call the [place] National Suicide Prevention Lifeline: 609 463 3393 or TTY: 970 630 5981 [place] 7655953999) to talk to a trained counselor call 1-800-273-TALK (toll free, 24  hour hotline) if experiencing a Mental Health or [place]  check blood pressure 3 times per week write blood pressure results in a log or diary learn about high blood pressure keep a blood pressure log take blood pressure log to all doctor appointments call doctor for signs and symptoms of high blood pressure keep all doctor appointments take medications for blood pressure exactly as prescribed report new symptoms to your doctor  Chronic Condition 3: GERD Provider Assessment and Plan  Encourage weight loss as this can help reduce reflux symptoms Continue Pantoprazole     Expected Outcome/Goals Addressed This Visit (Provider CCM goals/Provider Assessment and plan   CCM: Maintain, Monitor and Self-Manage Symptoms of GERD  Symptom Management Condition 3: Take all medications as prescribed Attend all scheduled provider appointments Call provider office for new concerns or questions  call the Suicide and Crisis Lifeline: 988 call the [place] National Suicide Prevention Lifeline: 765-099-1605 or TTY: 984 442 6689 TTY [phone]) to talk to a trained counselor call 1-800-273-TALK (toll free, 24 hour hotline) if experiencing a Mental Health or Behavioral Health Crisis   Problem List Patient Active Problem List   Diagnosis Date Noted   Pure hypercholesterolemia 07/09/2021   Overweight with body mass index (BMI) of 26 to 26.9 in adult 07/09/2021   Aortic atherosclerosis ([place]) 01/12/2021   Hypertension 08/30/2019   Constipation 04/17/2019   Bipolar 1 disorder ([place]) 08/23/2017   GERD (gastroesophageal reflux disease) 12/27/2016   Arthritis 12/27/2016   GAD (generalized anxiety disorder) 12/27/2016    Medication Management  Current Outpatient Medications:    acetaminophen (TYLENOL) 500 MG tablet, Take 500 mg by mouth every 8 (eight) hours as needed for headache., Disp: , Rfl:    aspirin 81 MG EC tablet, Take 1 tablet (81 mg total)  by mouth daily. Swallow whole.,  Disp: 90 tablet, Rfl: 1   clonazePAM (KLONOPIN) 0.5 MG tablet, TAKE 1 TABLET BY MOUTH 3 TIMES A DAY, Disp: 90 tablet, Rfl: 0   hydrocortisone cream 1 %, Apply 1 application topically 2 (two) times daily as needed for itching. , Disp: , Rfl:    lisinopril (ZESTRIL) 10 MG tablet, Take 1 tablet (10 mg total) by mouth daily., Disp: 90 tablet, Rfl: 1   pantoprazole (PROTONIX) 40 MG tablet, Take 1 tablet (40 mg total) by mouth daily., Disp: 90 tablet, Rfl: 1   PARoxetine (PAXIL) 40 MG tablet, Take 1 tablet (40 mg total) by mouth daily., Disp: 90 tablet, Rfl: 1   polyethylene glycol (MIRALAX / GLYCOLAX) 17 g packet, Take 17 g by mouth daily., Disp: , Rfl:   Cognitive Assessment Identity Confirmed: : Name; DOB Cognitive Status: Normal   Functional Assessment Hearing Difficulty or Deaf: no Wear Glasses or Blind: no Vision Management: wears glasses- eye exam 2 years ago encourged patient to make an appointment Concentrating, Remembering or Making Decisions Difficulty (CP): no Difficulty Communicating: no Difficulty Eating/Swallowing: no Walking or Climbing Stairs Difficulty: no Dressing/Bathing Difficulty: no Doing Errands Independently Difficulty (such as shopping) (CP): no Change in Functional Status Since Onset of Current Illness/Injury: no   Caregiver Assessment  Primary Source of Support/Comfort: child(ren); significant other Name of Support/Comfort Primary Source: husband and children People in Home: child(ren), adult; spouse Name(s) of People in Home: Casimiro Needle and son Family Caregiver if Needed: none Primary Roles/Responsibilities: retired   Planned Interventions  Evaluation of current treatment plan related to bipolar disorder and anxiety and patient's adherence to plan as established by provider Advised patient to call the office for changes in her mood, anxiety, depression or mental health needs  Provided education to patient re: discussing her concerns about the validity of her  urine test as it did not show that she was taking her klonopin. The patient does not understand why this happened.  Reviewed medications with patient and discussed compliance. The patient endorses compliance with medications  Reviewed scheduled/upcoming provider appointments including 01-22-2022 at 2 pm Discussed plans with patient for ongoing care management follow up and provided patient with direct contact information for care management team Advised patient to discuss changes in her mental health needs with provider Screening for signs and symptoms of depression related to chronic disease state  Assessed social determinant of health barriers Evaluation of current treatment plan related to GERD and patient's adherence to plan as established by provider Advised patient to call the office for changes in her sx and sx of GERD Provided education to patient re: foods to avoid that will cause exacerbation, monitoring for changes, keeping stress levels down, and notifying the provider for changes in her condition Reviewed medications with patient and discussed compliance, the patient states compliance with medications Reviewed scheduled/upcoming provider appointments including 01-22-2022 at 2 pm Discussed plans with patient for ongoing care management follow up and provided patient with direct contact information for care management team Advised patient to discuss changes in her GERD, questions or concerns with provider Screening for signs and symptoms of depression related to chronic disease state  Assessed social determinant of health barriers Evaluation of current treatment plan related to hypertension self management and patient's adherence to plan as established by provider;   Provided education to patient re: stroke prevention, s/s of heart attack and stroke; Reviewed prescribed diet heart healthy diet  Reviewed medications with patient and discussed importance of  compliance;  Discussed plans  with patient for ongoing care management follow up and provided patient with direct contact information for care management team; Advised patient, providing education and rationale, to monitor blood pressure daily and record, calling PCP for findings outside established parameters;  Reviewed scheduled/upcoming provider appointments including:  Advised patient to discuss changes in her blood pressures, parameters of when to hold her medications and other questions or concerns about heart healthy with provider; Provided education on prescribed diet heart healthy;  Discussed complications of poorly controlled blood pressure such as heart disease, stroke, circulatory complications, vision complications, kidney impairment, sexual dysfunction;  Screening for signs and symptoms of depression related to chronic disease state;  Assessed social determinant of health barriers;  The patient has been having some hypotension since starting blood pressure medications. She has stopped on her own with taking the medications as she felt her blood pressure was too low. She states her blood pressure only goes up if she is stressed.   Interaction and coordination with outside resources, practitioners, and providers See CCM Referral  Care Plan: Patient declined

## 2022-01-22 ENCOUNTER — Encounter: Payer: Self-pay | Admitting: Internal Medicine

## 2022-01-22 ENCOUNTER — Ambulatory Visit (INDEPENDENT_AMBULATORY_CARE_PROVIDER_SITE_OTHER): Payer: Medicare HMO | Admitting: Internal Medicine

## 2022-01-22 VITALS — BP 132/84 | HR 86 | Temp 96.9°F | Wt 153.0 lb

## 2022-01-22 DIAGNOSIS — I10 Essential (primary) hypertension: Secondary | ICD-10-CM

## 2022-01-22 DIAGNOSIS — Z79899 Other long term (current) drug therapy: Secondary | ICD-10-CM

## 2022-01-22 DIAGNOSIS — F411 Generalized anxiety disorder: Secondary | ICD-10-CM

## 2022-01-22 NOTE — Assessment & Plan Note (Signed)
Doing okay off meds Will monitor for now

## 2022-01-22 NOTE — Assessment & Plan Note (Signed)
Repeat UDS
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
## 2022-01-22 NOTE — Patient Instructions (Signed)

## 2022-01-23 LAB — DRUG MONITORING, PANEL 8 WITH CONFIRMATION, URINE
6 Acetylmorphine: NEGATIVE ng/mL (ref <10)
Alcohol Metabolites: NEGATIVE ng/mL (ref <500)
Amphetamines: NEGATIVE ng/mL (ref <500)
Benzodiazepines: NEGATIVE ng/mL (ref <100)
Buprenorphine, Urine: NEGATIVE ng/mL (ref <5)
Cocaine Metabolite: NEGATIVE ng/mL (ref <150)
Creatinine: 23.9 mg/dL (ref >=20.0)
MDMA: NEGATIVE ng/mL (ref <500)
Marijuana Metabolite: NEGATIVE ng/mL (ref <20)
Opiates: NEGATIVE ng/mL (ref <100)
Oxidant: NEGATIVE ug/mL (ref <200)
Oxycodone: NEGATIVE ng/mL (ref <100)
pH: 6.1 (ref 4.5–9.0)

## 2022-01-23 LAB — DM TEMPLATE

## 2022-01-25 ENCOUNTER — Other Ambulatory Visit: Payer: Self-pay | Admitting: Internal Medicine

## 2022-01-26 NOTE — Telephone Encounter (Signed)
Requested medications are due for refill today.  yes  Requested medications are on the active medications list.  yes  Last refill. 12/28/2021 #90 0 rf  Future visit scheduled.   yes  Notes to clinic.  Refill is not delegated.    Requested Prescriptions  Pending Prescriptions Disp Refills   clonazePAM (KLONOPIN) 0.5 MG tablet [Pharmacy Med Name: CLONAZEPAM 0.5 MG TAB] 90 tablet     Sig: TAKE 1 TABLET BY MOUTH 3 TIMES A DAY     Not Delegated - Psychiatry: Anxiolytics/Hypnotics 2 Failed - 01/25/2022  9:15 AM      Failed - This refill cannot be delegated      Failed - Urine Drug Screen completed in last 360 days      Passed - Patient is not pregnant      Passed - Valid encounter within last 6 months    Recent Outpatient Visits           4 days ago Primary hypertension   [facility] [place], Salvadore Oxford, NP   2 weeks ago Encounter for general adult medical examination with abnormal findings   [facility], Salvadore Oxford, NP   2 months ago Anxiety   [facility] [place], Gentry Fitz A, RPH-CPP   6 months ago Aortic atherosclerosis [facility])   [facility] [place], Salvadore Oxford, NP   1 year ago Medicare annual wellness visit, subsequent   [facility] [place], Salvadore Oxford, NP       Future Appointments             In 1 week [facility], Salvadore Oxford, NP [facility]

## 2022-02-08 ENCOUNTER — Encounter: Payer: Medicare HMO | Admitting: Internal Medicine

## 2022-02-11 DIAGNOSIS — I10 Essential (primary) hypertension: Secondary | ICD-10-CM

## 2022-02-15 ENCOUNTER — Other Ambulatory Visit: Payer: Self-pay

## 2022-02-15 DIAGNOSIS — Z1231 Encounter for screening mammogram for malignant neoplasm of breast: Secondary | ICD-10-CM

## 2022-02-15 NOTE — Progress Notes (Signed)
The pt is overdue for mammogram, and agreed today for me to place the order. She has never had a mammogram.

## 2022-02-17 ENCOUNTER — Other Ambulatory Visit: Payer: Self-pay | Admitting: Internal Medicine

## 2022-02-17 NOTE — Telephone Encounter (Signed)
Requested medication (s) are due for refill today: routing for review  Requested medication (s) are on the active medication list: yes  Last refill:  01/26/22  Future visit scheduled: yes  Notes to clinic:  Unable to refill per protocol, cannot delegate.      Requested Prescriptions  Pending Prescriptions Disp Refills   clonazePAM (KLONOPIN) 0.5 MG tablet [Pharmacy Med Name: CLONAZEPAM 0.5 MG TAB] 90 tablet     Sig: TAKE 1 TABLET BY MOUTH 3 TIMES A DAY     Not Delegated - Psychiatry: Anxiolytics/Hypnotics 2 Failed - 02/17/2022  2:07 PM      Failed - This refill cannot be delegated      Failed - Urine Drug Screen completed in last 360 days      Passed - Patient is not pregnant      Passed - Valid encounter within last 6 months    Recent Outpatient Visits           3 weeks ago Primary hypertension   [facility] [place], Salvadore Oxford, NP   1 month ago Encounter for general adult medical examination with abnormal findings   [facility] [place], Salvadore Oxford, NP   2 months ago Anxiety   [facility] [place], Gentry Fitz A, RPH-CPP   7 months ago Aortic atherosclerosis [facility])   [facility] [place], Salvadore Oxford, NP   1 year ago Medicare annual wellness visit, subsequent   [facility], Salvadore Oxford, NP

## 2022-03-11 ENCOUNTER — Telehealth: Payer: Self-pay

## 2022-03-11 ENCOUNTER — Telehealth: Payer: Medicare HMO

## 2022-03-11 NOTE — Telephone Encounter (Signed)
   CCM RN Visit Note   03-11-2022 Name: Katrina Sutton MRN: [id number]      DOB: 03/18/1950  Subjective: Katrina Sutton is a 71 y.o. year old female who is a primary care patient of Nicki Reaper, NP. The patient was referred to the Chronic Care Management team for assistance with care management needs subsequent to provider initiation of CCM services and plan of care.      An unsuccessful telephone outreach was attempted today to contact the patient about Chronic Care Management needs.    Plan:A HIPAA compliant phone message was left for the patient providing contact information and requesting a return call.  Alto Denver RN, MSN, CCM RN Care Manager  Chronic Care Management Direct Number: [phone]

## 2022-03-22 ENCOUNTER — Other Ambulatory Visit: Payer: Self-pay | Admitting: Internal Medicine

## 2022-03-23 NOTE — Telephone Encounter (Signed)
Requested medications are due for refill today.  yes  Requested medications are on the active medications list.  yes  Last refill. 02/18/2022 #90 0 rf  Future visit scheduled.   no  Notes to clinic.  Refill not delegated.    Requested Prescriptions  Pending Prescriptions Disp Refills   clonazePAM (KLONOPIN) 0.5 MG tablet [Pharmacy Med Name: CLONAZEPAM 0.5 MG TAB] 90 tablet     Sig: TAKE 1 TABLET BY MOUTH 3 TIMES A DAY     Not Delegated - Psychiatry: Anxiolytics/Hypnotics 2 Failed - 03/22/2022  8:59 AM      Failed - This refill cannot be delegated      Failed - Urine Drug Screen completed in last 360 days      Passed - Patient is not pregnant      Passed - Valid encounter within last 6 months    Recent Outpatient Visits           2 months ago Primary hypertension   [place], Coralie Keens, NP   2 months ago Encounter for general adult medical examination with abnormal findings   [facility] [place], Coralie Keens, NP   3 months ago [place], RPH-CPP   8 months ago Aortic atherosclerosis [facility])   [facility] [place], Coralie Keens, NP   1 year ago Medicare annual wellness visit, subsequent   [facility] [place], Coralie Keens, NP

## 2022-03-29 ENCOUNTER — Telehealth: Payer: Medicare HMO

## 2022-03-29 ENCOUNTER — Ambulatory Visit (INDEPENDENT_AMBULATORY_CARE_PROVIDER_SITE_OTHER): Payer: Medicare HMO

## 2022-03-29 DIAGNOSIS — F419 Anxiety disorder, unspecified: Secondary | ICD-10-CM

## 2022-03-29 DIAGNOSIS — I10 Essential (primary) hypertension: Secondary | ICD-10-CM

## 2022-03-29 DIAGNOSIS — F3289 Other specified depressive episodes: Secondary | ICD-10-CM

## 2022-03-29 DIAGNOSIS — F319 Bipolar disorder, unspecified: Secondary | ICD-10-CM

## 2022-03-29 DIAGNOSIS — K219 Gastro-esophageal reflux disease without esophagitis: Secondary | ICD-10-CM

## 2022-03-29 DIAGNOSIS — F411 Generalized anxiety disorder: Secondary | ICD-10-CM

## 2022-03-29 NOTE — Patient Instructions (Signed)
Please call the care guide team at [phone] if you need to cancel or reschedule your appointment.   If you are experiencing a Mental Health or [place] or need someone to talk to, please call the Suicide and Crisis Lifeline: 988 call the [place] National Suicide Prevention Lifeline: (217)129-9096 or TTY: 5036251375 TTY 7690090787) to talk to a trained counselor call 1-800-273-TALK (toll free, 24 hour hotline)   Following is a copy of the CCM Program Consent:  CCM service includes personalized support from designated clinical staff supervised by the physician, including individualized plan of care and coordination with other care providers 24/7 contact phone numbers for assistance for urgent and routine care needs. Service will only be billed when office clinical staff spend 20 minutes or more in a month to coordinate care. Only one practitioner may furnish and bill the service in a calendar month. The patient may stop CCM services at amy time (effective at the end of the month) by phone call to the office staff. The patient will be responsible for cost sharing (co-pay) or up to 20% of the service fee (after annual deductible is met)  Following is a copy of your full provider care plan:   Goals Addressed             This Visit's Progress    CCM Expected Outcome:  Monitor, Self-Manage and Reduce Symptoms of: GERD       Current Barriers:  Knowledge Deficits related to how to control triggers that cause exacerbation of GERD Care Coordination needs related to resources and ongoing education needs  in a patient with GERD Chronic Disease Management support and education needs related to Effective management of GERD Lacks caregiver support.   Planned Interventions: Evaluation of current treatment plan related to GERD and patient's adherence to plan as established by provider. The patient states her GERD and GI sx and sx are stable. She has gotten her bowel habits stable  and is happy about this.  Advised patient to call the office for changes in her sx and sx of GERD Provided education to patient re: foods to avoid that will cause exacerbation, monitoring for changes, keeping stress levels down, and notifying the provider for changes in her condition. Review of dietary habits Reviewed medications with patient and discussed compliance, the patient states compliance with medications Reviewed scheduled/upcoming provider appointments including 06-24-2022 at 120 pm Discussed plans with patient for ongoing care management follow up and provided patient with direct contact information for care management team Advised patient to discuss changes in her GERD, questions or concerns with provider Screening for signs and symptoms of depression related to chronic disease state  Assessed social determinant of health barriers  Symptom Management: Take medications as prescribed   Attend all scheduled provider appointments Call provider office for new concerns or questions  call the Suicide and Crisis Lifeline: 988 call the [place] National Suicide Prevention Lifeline: 5130132652 or TTY: 9414409476 TTY 720-862-0058) to talk to a trained counselor call 1-800-273-TALK (toll free, 24 hour hotline) if experiencing a Mental Health or [place]   Follow Up Plan: Telephone follow up appointment with care management team member scheduled for: 13-01-2022 at 1 pm       CCM Expected Outcome:  Monitor, Self-Manage and Reduce Symptoms [id number] disorder and anxiety       Current Barriers:  Knowledge Deficits related to not understanding why her urine test did not show positive for her klonopin as she takes it as directed.  Care Coordination needs related to ongoing education and support needed  in a patient with bipolar disorder and anxiety Chronic Disease Management support and education needs related to effective management of bipolar disorder and anxiety Lacks  caregiver support.   Planned Interventions: Evaluation of current treatment plan related to bipolar disorder and anxiety and patient's adherence to plan as established by provider. The patient is having a good day today and states that she just takes it one day at a time and does the best she can. She denies any acute changes in her anxiety, depression, or bipolar disorder. She knows to reach out for changes or new needs  Advised patient to call the office for changes in her mood, anxiety, depression or mental health needs  Provided education to patient re: The patient states she is happy that she got her medication refills. Sometimes she gets confused about test results and testing but states she is happy for the support of the [facility] and her providers.   Reviewed medications with patient and discussed compliance. The patient endorses compliance with medications  Reviewed scheduled/upcoming provider appointments including 06-24-2022 at 120 pm Discussed plans with patient for ongoing care management follow up and provided patient with direct contact information for care management team Advised patient to discuss changes in her mental health needs with provider Screening for signs and symptoms of depression related to chronic disease state  Assessed social determinant of health barriers  Symptom Management: Take medications as prescribed   Attend all scheduled provider appointments Call provider office for new concerns or questions  Work with the social worker to address care coordination needs and will continue to work with the clinical team to address health care and disease management related needs call the Suicide and Crisis Lifeline: 988 call the [place] National Suicide Prevention Lifeline: 424-872-5930 or TTY: 512 572 6268 TTY (309) 784-7364) to talk to a trained counselor call 1-800-273-TALK (toll free, 24 hour hotline) if experiencing a Mental Health or [place]   Follow Up  Plan: Telephone follow up appointment with care management team member scheduled for: 05-24-2022 at 1 pm       CCM Expected Outcome:  Monitor, Self-Manage, and Reduce Symptoms of Hypertension       Current Barriers:  Knowledge Deficits related to controlling blood pressures and monitoring for factors that stress her out and increase her blood pressures Care Coordination needs related to patient dealing with some hypotension since starting medications and not knowing when to take medications or not in a patient with HTN Chronic Disease Management support and education needs related to effective management of HTN Lacks caregiver support.  BP Readings from Last 3 Encounters:  01/22/22 132/84  01/08/22 (!) 142/94  07/09/21 132/78     Planned Interventions: Evaluation of current treatment plan related to hypertension self management and patient's adherence to plan as established by provider. The patient denies any acute changes in her HTN or heart health. She does have an elevation in her blood pressures when she gets stressed out or overwhelmed. Education and support given;   Provided education to patient re: stroke prevention, s/s of heart attack and stroke; Reviewed prescribed diet heart healthy diet  Reviewed medications with patient and discussed importance of compliance. The patient is compliant with medications.;  Discussed plans with patient for ongoing care management follow up and provided patient with direct contact information for care management team; Advised patient, providing education and rationale, to monitor blood pressure daily and record, calling PCP for findings  outside established parameters;  Reviewed scheduled/upcoming provider appointments including: 06-24-2022 at 120 pm. Knows to call for changes or needs  Advised patient to discuss changes in her blood pressures, parameters of when to hold her medications and other questions or concerns about heart healthy with  provider; Provided education on prescribed diet heart healthy;  Discussed complications of poorly controlled blood pressure such as heart disease, stroke, circulatory complications, vision complications, kidney impairment, sexual dysfunction;  Screening for signs and symptoms of depression related to chronic disease state;  Assessed social determinant of health barriers;  The patient has been having some hypotension since starting blood pressure medications. She has stopped on her own with taking the medications as she felt her blood pressure was too low. She states her blood pressure only goes up if she is stressed.   Symptom Management: Take medications as prescribed   Attend all scheduled provider appointments Call pharmacy for medication refills 3-7 days in advance of running out of medications Call provider office for new concerns or questions  call the Suicide and Crisis Lifeline: 988 call the [place] National Suicide Prevention Lifeline: 769-720-8332 or TTY: (458)103-7974 TTY 308 121 2845) to talk to a trained counselor call 1-800-273-TALK (toll free, 24 hour hotline) if experiencing a Mental Health or Behavioral Health Crisis  check blood pressure 3 times per week learn about high blood pressure keep a blood pressure log take blood pressure log to all doctor appointments call doctor for signs and symptoms of high blood pressure develop an action plan for high blood pressure keep all doctor appointments take medications for blood pressure exactly as prescribed report new symptoms to your doctor  Follow Up Plan: Telephone follow up appointment with care management team member scheduled for: 05-24-2022 at 1 pm          The patient verbalized understanding of instructions, educational materials, and care plan provided today and DECLINED offer to receive copy of patient instructions, educational materials, and care plan.   An initial telephone outreach has been scheduled for:   05-23-2021 at 1 pm

## 2022-03-29 NOTE — Chronic Care Management (AMB) (Signed)
Chronic Care Management   CCM RN Visit Note  03/29/2022 Name: Katrina Sutton MRN: [id number] DOB: [birth year]  Subjective: Katrina Sutton is a 72 y.o. year old female who is a primary care patient of Katrina Fenton, NP. The patient was referred to the Chronic Care Management team for assistance with care management needs subsequent to provider initiation of CCM services and plan of care.    Today's Visit:  Engaged with patient by telephone for follow up visit.        Goals Addressed             This Visit's Progress    CCM Expected Outcome:  Monitor, Self-Manage and Reduce Symptoms of: GERD       Current Barriers:  Knowledge Deficits related to how to control triggers that cause exacerbation of GERD Care Coordination needs related to resources and ongoing education needs  in a patient with GERD Chronic Disease Management support and education needs related to Effective management of GERD Lacks caregiver support.   Planned Interventions: Evaluation of current treatment plan related to GERD and patient's adherence to plan as established by provider. The patient states her GERD and GI sx and sx are stable. She has gotten her bowel habits stable and is happy about this.  Advised patient to call the office for changes in her sx and sx of GERD Provided education to patient re: foods to avoid that will cause exacerbation, monitoring for changes, keeping stress levels down, and notifying the provider for changes in her condition. Review of dietary habits Reviewed medications with patient and discussed compliance, the patient states compliance with medications Reviewed scheduled/upcoming provider appointments including 06-24-2022 at 120 pm Discussed plans with patient for ongoing care management follow up and provided patient with direct contact information for care management team Advised patient to discuss changes in her GERD, questions or concerns with provider Screening for signs and symptoms  of depression related to chronic disease state  Assessed social determinant of health barriers  Symptom Management: Take medications as prescribed   Attend all scheduled provider appointments Call provider office for new concerns or questions  call the Suicide and Crisis Lifeline: 988 call the [place] National Suicide Prevention Lifeline: (870)216-7737 or TTY: 717-880-5036 TTY 985-345-7953) to talk to a trained counselor call 1-800-273-TALK (toll free, 24 hour hotline) if experiencing a Mental Health or [place]   Follow Up Plan: Telephone follow up appointment with care management team member scheduled for: 13-01-2022 at 1 pm       CCM Expected Outcome:  Monitor, Self-Manage and Reduce Symptoms [id number] disorder and anxiety       Current Barriers:  Knowledge Deficits related to not understanding why her urine test did not show positive for her klonopin as she takes it as directed.  Care Coordination needs related to ongoing education and support needed  in a patient with bipolar disorder and anxiety Chronic Disease Management support and education needs related to effective management of bipolar disorder and anxiety Lacks caregiver support.   Planned Interventions: Evaluation of current treatment plan related to bipolar disorder and anxiety and patient's adherence to plan as established by provider. The patient is having a good day today and states that she just takes it one day at a time and does the best she can. She denies any acute changes in her anxiety, depression, or bipolar disorder. She knows to reach out for changes or new needs  Advised patient to call the office for  changes in her mood, anxiety, depression or mental health needs  Provided education to patient re: The patient states she is happy that she got her medication refills. Sometimes she gets confused about test results and testing but states she is happy for the support of the [facility] and her providers.    Reviewed medications with patient and discussed compliance. The patient endorses compliance with medications  Reviewed scheduled/upcoming provider appointments including 06-24-2022 at 120 pm Discussed plans with patient for ongoing care management follow up and provided patient with direct contact information for care management team Advised patient to discuss changes in her mental health needs with provider Screening for signs and symptoms of depression related to chronic disease state  Assessed social determinant of health barriers  Symptom Management: Take medications as prescribed   Attend all scheduled provider appointments Call provider office for new concerns or questions  Work with the social worker to address care coordination needs and will continue to work with the clinical team to address health care and disease management related needs call the Suicide and Crisis Lifeline: 988 call the [place] National Suicide Prevention Lifeline: (562)053-0558 or TTY: 4701262494 TTY 3191366697) to talk to a trained counselor call 1-800-273-TALK (toll free, 24 hour hotline) if experiencing a Mental Health or [place]   Follow Up Plan: Telephone follow up appointment with care management team member scheduled for: 05-24-2022 at 1 pm       CCM Expected Outcome:  Monitor, Self-Manage, and Reduce Symptoms of Hypertension       Current Barriers:  Knowledge Deficits related to controlling blood pressures and monitoring for factors that stress her out and increase her blood pressures Care Coordination needs related to patient dealing with some hypotension since starting medications and not knowing when to take medications or not in a patient with HTN Chronic Disease Management support and education needs related to effective management of HTN Lacks caregiver support.  BP Readings from Last 3 Encounters:  01/22/22 132/84  01/08/22 (!) 142/94  07/09/21 132/78     Planned  Interventions: Evaluation of current treatment plan related to hypertension self management and patient's adherence to plan as established by provider. The patient denies any acute changes in her HTN or heart health. She does have an elevation in her blood pressures when she gets stressed out or overwhelmed. Education and support given;   Provided education to patient re: stroke prevention, s/s of heart attack and stroke; Reviewed prescribed diet heart healthy diet  Reviewed medications with patient and discussed importance of compliance. The patient is compliant with medications.;  Discussed plans with patient for ongoing care management follow up and provided patient with direct contact information for care management team; Advised patient, providing education and rationale, to monitor blood pressure daily and record, calling PCP for findings outside established parameters;  Reviewed scheduled/upcoming provider appointments including: 06-24-2022 at 120 pm. Knows to call for changes or needs  Advised patient to discuss changes in her blood pressures, parameters of when to hold her medications and other questions or concerns about heart healthy with provider; Provided education on prescribed diet heart healthy;  Discussed complications of poorly controlled blood pressure such as heart disease, stroke, circulatory complications, vision complications, kidney impairment, sexual dysfunction;  Screening for signs and symptoms of depression related to chronic disease state;  Assessed social determinant of health barriers;  The patient has been having some hypotension since starting blood pressure medications. She has stopped on her own with taking the medications  as she felt her blood pressure was too low. She states her blood pressure only goes up if she is stressed.   Symptom Management: Take medications as prescribed   Attend all scheduled provider appointments Call pharmacy for medication refills 3-7  days in advance of running out of medications Call provider office for new concerns or questions  call the Suicide and Crisis Lifeline: 988 call the [place] National Suicide Prevention Lifeline: 6053259381 or TTY: 484-584-6285 TTY (859)817-5344) to talk to a trained counselor call 1-800-273-TALK (toll free, 24 hour hotline) if experiencing a Mental Health or Behavioral Health Crisis  check blood pressure 3 times per week learn about high blood pressure keep a blood pressure log take blood pressure log to all doctor appointments call doctor for signs and symptoms of high blood pressure develop an action plan for high blood pressure keep all doctor appointments take medications for blood pressure exactly as prescribed report new symptoms to your doctor  Follow Up Plan: Telephone follow up appointment with care management team member scheduled for: 05-24-2022 at 1 pm          Plan:Telephone follow up appointment with care management team member scheduled for:  05-24-2022 at 1 pm  Alto [place] RN, MSN, CCM RN Care Manager  Chronic Care Management Direct Number: [phone]

## 2022-04-14 DIAGNOSIS — I10 Essential (primary) hypertension: Secondary | ICD-10-CM

## 2022-04-14 DIAGNOSIS — F3289 Other specified depressive episodes: Secondary | ICD-10-CM

## 2022-04-19 ENCOUNTER — Other Ambulatory Visit: Payer: Self-pay | Admitting: Internal Medicine

## 2022-04-20 NOTE — Telephone Encounter (Signed)
Requested medication (s) are due for refill today: yes  Requested medication (s) are on the active medication list: yes  Last refill:  03/23/22  Future visit scheduled: yes  Notes to clinic:  Unable to refill per protocol, cannot delegate.      Requested Prescriptions  Pending Prescriptions Disp Refills   clonazePAM (KLONOPIN) 0.5 MG tablet [Pharmacy Med Name: CLONAZEPAM 0.5 MG TAB] 90 tablet     Sig: TAKE 1 TABLET BY MOUTH 3 TIMES A DAY     Not Delegated - Psychiatry: Anxiolytics/Hypnotics 2 Failed - 04/19/2022  8:50 AM      Failed - This refill cannot be delegated      Failed - Urine Drug Screen completed in last 360 days      Passed - Patient is not pregnant      Passed - Valid encounter within last 6 months    Recent Outpatient Visits           2 months ago Primary hypertension   [place], Coralie Keens, NP   3 months ago Encounter for general adult medical examination with abnormal findings   [facility] [place], Coralie Keens, NP   4 months ago [place], RPH-CPP   9 months ago Aortic atherosclerosis [facility])   [facility] [place], Coralie Keens, NP   1 year ago Medicare annual wellness visit, subsequent   [facility] [place], Coralie Keens, [place]

## 2022-04-30 ENCOUNTER — Other Ambulatory Visit: Payer: Self-pay | Admitting: Internal Medicine

## 2022-04-30 DIAGNOSIS — F419 Anxiety disorder, unspecified: Secondary | ICD-10-CM

## 2022-05-03 NOTE — Telephone Encounter (Signed)
Requested Prescriptions  Pending Prescriptions Disp Refills   PARoxetine (PAXIL) 40 MG tablet [Pharmacy Med Name: PAROXETINE HCL 40 MG TAB] 90 tablet 0    Sig: TAKE 1 TABLET BY MOUTH DAILY     Psychiatry:  Antidepressants - SSRI Passed - 04/30/2022  4:37 PM      Passed - Valid encounter within last 6 months    Recent Outpatient Visits           3 months ago Primary hypertension   [facility] [place], Coralie Keens, NP   3 months ago Encounter for general adult medical examination with abnormal findings   [facility] [place], Coralie Keens, NP   5 months ago [place], RPH-CPP   9 months ago Aortic atherosclerosis [facility])   [facility] [place], Coralie Keens, NP   1 year ago Medicare annual wellness visit, subsequent   [facility] [place], Coralie Keens, [place]

## 2022-05-18 ENCOUNTER — Other Ambulatory Visit: Payer: Self-pay | Admitting: Internal Medicine

## 2022-05-19 ENCOUNTER — Other Ambulatory Visit: Payer: Self-pay | Admitting: Internal Medicine

## 2022-05-19 NOTE — Telephone Encounter (Signed)
Medication Refill - Medication: clonazePAM (KLONOPIN) 0.5 MG tablet   Has the patient contacted their pharmacy? No.  Preferred Pharmacy (with phone number or street name):  [place], [place] Phone: [phone]  Fax: [phone]     Has the patient been seen for an appointment in the last year OR does the patient have an upcoming appointment? Yes.    Agent: Please be advised that RX refills may take up to 3 business days. We ask that you follow-up with your pharmacy.

## 2022-05-19 NOTE — Telephone Encounter (Signed)
Requested medication (s) are due for refill today - yes  Requested medication (s) are on the active medication list -yes  Future visit scheduled -no  Last refill: 04/20/22 #90  Notes to clinic: non delegated Rx  Requested Prescriptions  Pending Prescriptions Disp Refills   clonazePAM (KLONOPIN) 0.5 MG tablet 90 tablet 0    Sig: Take 1 tablet (0.5 mg total) by mouth 3 (three) times daily.     Not Delegated - Psychiatry: Anxiolytics/Hypnotics 2 Failed - 05/19/2022 10:41 AM      Failed - This refill cannot be delegated      Failed - Urine Drug Screen completed in last 360 days      Passed - Patient is not pregnant      Passed - Valid encounter within last 6 months    Recent Outpatient Visits           3 months ago Primary hypertension   [facility] [place], Coralie Keens, NP   4 months ago Encounter for general adult medical examination with abnormal findings   [facility] [place], Coralie Keens, NP   5 months ago [facility] [place], [place] A, RPH-CPP   10 months ago Aortic atherosclerosis [facility])   [facility] [place], Coralie Keens, NP   1 year ago Medicare annual wellness visit, subsequent   [facility] [place], Coralie Keens, NP                 Requested Prescriptions  Pending Prescriptions Disp Refills   clonazePAM (KLONOPIN) 0.5 MG tablet 90 tablet 0    Sig: Take 1 tablet (0.5 mg total) by mouth 3 (three) times daily.     Not Delegated - Psychiatry: Anxiolytics/Hypnotics 2 Failed - 05/19/2022 10:41 AM      Failed - This refill cannot be delegated      Failed - Urine Drug Screen completed in last 360 days      Passed - Patient is not pregnant      Passed - Valid encounter within last 6 months    Recent Outpatient Visits           3 months ago Primary hypertension   [place], Coralie Keens, NP   4 months  ago Encounter for general adult medical examination with abnormal findings   [facility] [place], Coralie Keens, NP   5 months ago [place], RPH-CPP   10 months ago Aortic atherosclerosis [facility])   [facility] [place], Coralie Keens, NP   1 year ago Medicare annual wellness visit, subsequent   [facility] [place], Coralie Keens, [place]

## 2022-05-24 ENCOUNTER — Ambulatory Visit (INDEPENDENT_AMBULATORY_CARE_PROVIDER_SITE_OTHER): Payer: Medicare HMO

## 2022-05-24 ENCOUNTER — Telehealth: Payer: Medicare HMO

## 2022-05-24 DIAGNOSIS — F419 Anxiety disorder, unspecified: Secondary | ICD-10-CM

## 2022-05-24 DIAGNOSIS — F3289 Other specified depressive episodes: Secondary | ICD-10-CM

## 2022-05-24 DIAGNOSIS — K219 Gastro-esophageal reflux disease without esophagitis: Secondary | ICD-10-CM

## 2022-05-24 DIAGNOSIS — I10 Essential (primary) hypertension: Secondary | ICD-10-CM

## 2022-05-24 DIAGNOSIS — F319 Bipolar disorder, unspecified: Secondary | ICD-10-CM

## 2022-05-24 DIAGNOSIS — F411 Generalized anxiety disorder: Secondary | ICD-10-CM

## 2022-05-24 NOTE — Chronic Care Management (AMB) (Signed)
Chronic Care Management   CCM RN Visit Note  05/24/2022 Name: Katrina Sutton MRN: [id number] DOB: Jun 07, 1950  Subjective: Katrina Sutton is a 72 y.o. year old female who is a primary care patient of Jearld Fenton, NP. The patient was referred to the Chronic Care Management team for assistance with care management needs subsequent to provider initiation of CCM services and plan of care.    Today's Visit:  Engaged with patient by telephone for follow up visit.        Goals Addressed             This Visit's Progress    CCM Expected Outcome:  Monitor, Self-Manage and Reduce Symptoms of: GERD       Current Barriers:  Knowledge Deficits related to how to control triggers that cause exacerbation of GERD Care Coordination needs related to resources and ongoing education needs  in a patient with GERD Chronic Disease Management support and education needs related to Effective management of GERD Lacks caregiver support.   Planned Interventions: Evaluation of current treatment plan related to GERD and patient's adherence to plan as established by provider. The patient states her GERD and GI sx and sx are stable. She has gotten her bowel habits stable and is happy about this. She did have a stomach virus but is over this now. Is monitoring what she eats and is stable. Feels good.  Advised patient to call the office for changes in her sx and sx of GERD Provided education to patient re: foods to avoid that will cause exacerbation, monitoring for changes, keeping stress levels down, and notifying the provider for changes in her condition. Review of dietary habits Reviewed medications with patient and discussed compliance, the patient states compliance with medications Reviewed scheduled/upcoming provider appointments including 06-24-2022 at 120 pm Discussed plans with patient for ongoing care management follow up and provided patient with direct contact information for care management team Advised  patient to discuss changes in her GERD, questions or concerns with provider Screening for signs and symptoms of depression related to chronic disease state  Assessed social determinant of health barriers  Symptom Management: Take medications as prescribed   Attend all scheduled provider appointments Call provider office for new concerns or questions  call the Suicide and Crisis Lifeline: 988 call the [place] National Suicide Prevention Lifeline: 680-769-5931 or TTY: 234-608-4083 TTY 539-043-9310) to talk to a trained counselor call 1-800-273-TALK (toll free, 24 hour hotline) if experiencing a Mental Health or [place]   Follow Up Plan: Telephone follow up appointment with care management team member scheduled for: 07-27-2022  at 1 pm       CCM Expected Outcome:  Monitor, Self-Manage and Reduce Symptoms [id number] disorder and anxiety       Current Barriers:  Knowledge Deficits related to not understanding why her urine test did not show positive for her klonopin as she takes it as directed.  Care Coordination needs related to ongoing education and support needed  in a patient with bipolar disorder and anxiety Chronic Disease Management support and education needs related to effective management of bipolar disorder and anxiety Lacks caregiver support.   Planned Interventions: Evaluation of current treatment plan related to bipolar disorder and anxiety and patient's adherence to plan as established by provider. The patient is having a good day today and states that she just takes it one day at a time and does the best she can. She states sometimes she gets easily overwhelmed as  she often does at times. She had a bad experience on Friday at a restaurant but she left and she feels fine now. She believes this was meant to be so she would not go back there. Reflective listening and support given. Will continue to monitor for changes. She says things have settled down a lot with her  husband. He is working at [employer] and is at the [employer] on the weekends.   Advised patient to call the office for changes in her mood, anxiety, depression or mental health needs  Provided education to patient re: The patient states she is happy that she got her medication refills. Sometimes she gets confused about test results and testing but states she is happy for the support of the [facility] and her providers.   Reviewed medications with patient and discussed compliance. The patient endorses compliance with medications  Reviewed scheduled/upcoming provider appointments including 06-24-2022 at 120 pm Discussed plans with patient for ongoing care management follow up and provided patient with direct contact information for care management team Advised patient to discuss changes in her mental health needs with provider Screening for signs and symptoms of depression related to chronic disease state  Assessed social determinant of health barriers  Symptom Management: Take medications as prescribed   Attend all scheduled provider appointments Call provider office for new concerns or questions  Work with the social worker to address care coordination needs and will continue to work with the clinical team to address health care and disease management related needs call the Suicide and Crisis Lifeline: 988 call the [place] National Suicide Prevention Lifeline: (873) 099-0831 or TTY: (519)175-4866 TTY 916-613-7377) to talk to a trained counselor call 1-800-273-TALK (toll free, 24 hour hotline) if experiencing a Mental Health or [place]   Follow Up Plan: Telephone follow up appointment with care management team member scheduled for: 07-27-2022 at 1 pm       CCM Expected Outcome:  Monitor, Self-Manage, and Reduce Symptoms of Hypertension       Current Barriers:  Knowledge Deficits related to controlling blood pressures and monitoring for factors that stress her out and increase her blood  pressures Care Coordination needs related to patient dealing with some hypotension since starting medications and not knowing when to take medications or not in a patient with HTN Chronic Disease Management support and education needs related to effective management of HTN Lacks caregiver support.  BP Readings from Last 3 Encounters:  01/22/22 132/84  01/08/22 (!) 142/94  07/09/21 132/78     Planned Interventions: Evaluation of current treatment plan related to hypertension self management and patient's adherence to plan as established by provider. The patient denies any acute changes in her HTN or heart health. She does have an elevation in her blood pressures when she gets stressed out or overwhelmed. She feels like her blood pressure was a little elevated on Friday when she had an incident at a place she goes to eat but after she left and decided she would not ever go back she feels so much better. She feels like that is her way of being protected.    Provided education to patient re: stroke prevention, s/s of heart attack and stroke; Reviewed prescribed diet heart healthy diet. The patient is compliant with heart healthy diet. Eating well and denies any acute changes in her dietary habits.  Reviewed medications with patient and discussed importance of compliance. The patient is compliant with medications.;  Discussed plans with patient for ongoing care  management follow up and provided patient with direct contact information for care management team; Advised patient, providing education and rationale, to monitor blood pressure daily and record, calling PCP for findings outside established parameters;  Reviewed scheduled/upcoming provider appointments including: 06-24-2022 at 120 pm. Knows to call for changes or needs  Advised patient to discuss changes in her blood pressures, parameters of when to hold her medications and other questions or concerns about heart healthy with provider; Provided  education on prescribed diet heart healthy;  Discussed complications of poorly controlled blood pressure such as heart disease, stroke, circulatory complications, vision complications, kidney impairment, sexual dysfunction;  Screening for signs and symptoms of depression related to chronic disease state;  Assessed social determinant of health barriers;  The patient has been having some hypotension since starting blood pressure medications. She has stopped on her own with taking the medications as she felt her blood pressure was too low. She states her blood pressure only goes up if she is stressed.   Symptom Management: Take medications as prescribed   Attend all scheduled provider appointments Call pharmacy for medication refills 3-7 days in advance of running out of medications Call provider office for new concerns or questions  call the Suicide and Crisis Lifeline: 988 call the [place] National Suicide Prevention Lifeline: (469) 330-0063 or TTY: 210-354-0186 TTY (209)235-6570) to talk to a trained counselor call 1-800-273-TALK (toll free, 24 hour hotline) if experiencing a Mental Health or [place]  check blood pressure 3 times per week learn about high blood pressure keep a blood pressure log take blood pressure log to all doctor appointments call doctor for signs and symptoms of high blood pressure develop an action plan for high blood pressure keep all doctor appointments take medications for blood pressure exactly as prescribed report new symptoms to your doctor  Follow Up Plan: Telephone follow up appointment with care management team member scheduled for: 07-27-2022 at 1 pm          Plan:Telephone follow up appointment with care management team member scheduled for:  07-27-2022 at 1 pm  [place], MSN, CCM RN Care Manager  Chronic Care Management Direct Number: [phone]

## 2022-05-24 NOTE — Patient Instructions (Signed)
Please call the care guide team at [phone] if you need to cancel or reschedule your appointment.   If you are experiencing a Mental Health or [place] or need someone to talk to, please call the Suicide and Crisis Lifeline: 988 call the [place] National Suicide Prevention Lifeline: 323-772-0427 or TTY: 437-676-4388 TTY 612 196 0278) to talk to a trained counselor call 1-800-273-TALK (toll free, 24 hour hotline)   Following is a copy of the CCM Program Consent:  CCM service includes personalized support from designated clinical staff supervised by the physician, including individualized plan of care and coordination with other care providers 24/7 contact phone numbers for assistance for urgent and routine care needs. Service will only be billed when office clinical staff spend 20 minutes or more in a month to coordinate care. Only one practitioner may furnish and bill the service in a calendar month. The patient may stop CCM services at amy time (effective at the end of the month) by phone call to the office staff. The patient will be responsible for cost sharing (co-pay) or up to 20% of the service fee (after annual deductible is met)  Following is a copy of your full provider care plan:   Goals Addressed             This Visit's Progress    CCM Expected Outcome:  Monitor, Self-Manage and Reduce Symptoms of: GERD       Current Barriers:  Knowledge Deficits related to how to control triggers that cause exacerbation of GERD Care Coordination needs related to resources and ongoing education needs  in a patient with GERD Chronic Disease Management support and education needs related to Effective management of GERD Lacks caregiver support.   Planned Interventions: Evaluation of current treatment plan related to GERD and patient's adherence to plan as established by provider. The patient states her GERD and GI sx and sx are stable. She has gotten her bowel habits stable  and is happy about this. She did have a stomach virus but is over this now. Is monitoring what she eats and is stable. Feels good.  Advised patient to call the office for changes in her sx and sx of GERD Provided education to patient re: foods to avoid that will cause exacerbation, monitoring for changes, keeping stress levels down, and notifying the provider for changes in her condition. Review of dietary habits Reviewed medications with patient and discussed compliance, the patient states compliance with medications Reviewed scheduled/upcoming provider appointments including 06-24-2022 at 120 pm Discussed plans with patient for ongoing care management follow up and provided patient with direct contact information for care management team Advised patient to discuss changes in her GERD, questions or concerns with provider Screening for signs and symptoms of depression related to chronic disease state  Assessed social determinant of health barriers  Symptom Management: Take medications as prescribed   Attend all scheduled provider appointments Call provider office for new concerns or questions  call the Suicide and Crisis Lifeline: 988 call the [place] National Suicide Prevention Lifeline: (413) 093-2140 or TTY: 2188833210 TTY 4370556937) to talk to a trained counselor call 1-800-273-TALK (toll free, 24 hour hotline) if experiencing a Mental Health or [place]   Follow Up Plan: Telephone follow up appointment with care management team member scheduled for: 07-27-2022  at 1 pm       CCM Expected Outcome:  Monitor, Self-Manage and Reduce Symptoms [id number] disorder and anxiety       Current Barriers:  Knowledge Deficits  related to not understanding why her urine test did not show positive for her klonopin as she takes it as directed.  Care Coordination needs related to ongoing education and support needed  in a patient with bipolar disorder and anxiety Chronic Disease  Management support and education needs related to effective management of bipolar disorder and anxiety Lacks caregiver support.   Planned Interventions: Evaluation of current treatment plan related to bipolar disorder and anxiety and patient's adherence to plan as established by provider. The patient is having a good day today and states that she just takes it one day at a time and does the best she can. She states sometimes she gets easily overwhelmed as she often does at times. She had a bad experience on Friday at a restaurant but she left and she feels fine now. She believes this was meant to be so she would not go back there. Reflective listening and support given. Will continue to monitor for changes. She says things have settled down a lot with her husband. He is working at [employer] and is at the [employer] on the weekends.   Advised patient to call the office for changes in her mood, anxiety, depression or mental health needs  Provided education to patient re: The patient states she is happy that she got her medication refills. Sometimes she gets confused about test results and testing but states she is happy for the support of the [facility] and her providers.   Reviewed medications with patient and discussed compliance. The patient endorses compliance with medications  Reviewed scheduled/upcoming provider appointments including 06-24-2022 at 120 pm Discussed plans with patient for ongoing care management follow up and provided patient with direct contact information for care management team Advised patient to discuss changes in her mental health needs with provider Screening for signs and symptoms of depression related to chronic disease state  Assessed social determinant of health barriers  Symptom Management: Take medications as prescribed   Attend all scheduled provider appointments Call provider office for new concerns or questions  Work with the social worker to address care coordination needs  and will continue to work with the clinical team to address health care and disease management related needs call the Suicide and Crisis Lifeline: 988 call the [place] National Suicide Prevention Lifeline: 3465070267 or TTY: 416-721-7862 TTY 770 092 2614) to talk to a trained counselor call 1-800-273-TALK (toll free, 24 hour hotline) if experiencing a Mental Health or [place]   Follow Up Plan: Telephone follow up appointment with care management team member scheduled for: 07-27-2022 at 1 pm       CCM Expected Outcome:  Monitor, Self-Manage, and Reduce Symptoms of Hypertension       Current Barriers:  Knowledge Deficits related to controlling blood pressures and monitoring for factors that stress her out and increase her blood pressures Care Coordination needs related to patient dealing with some hypotension since starting medications and not knowing when to take medications or not in a patient with HTN Chronic Disease Management support and education needs related to effective management of HTN Lacks caregiver support.  BP Readings from Last 3 Encounters:  01/22/22 132/84  01/08/22 (!) 142/94  07/09/21 132/78     Planned Interventions: Evaluation of current treatment plan related to hypertension self management and patient's adherence to plan as established by provider. The patient denies any acute changes in her HTN or heart health. She does have an elevation in her blood pressures when she gets stressed out  or overwhelmed. She feels like her blood pressure was a little elevated on Friday when she had an incident at a place she goes to eat but after she left and decided she would not ever go back she feels so much better. She feels like that is her way of being protected.    Provided education to patient re: stroke prevention, s/s of heart attack and stroke; Reviewed prescribed diet heart healthy diet. The patient is compliant with heart healthy diet. Eating well and denies  any acute changes in her dietary habits.  Reviewed medications with patient and discussed importance of compliance. The patient is compliant with medications.;  Discussed plans with patient for ongoing care management follow up and provided patient with direct contact information for care management team; Advised patient, providing education and rationale, to monitor blood pressure daily and record, calling PCP for findings outside established parameters;  Reviewed scheduled/upcoming provider appointments including: 06-24-2022 at 120 pm. Knows to call for changes or needs  Advised patient to discuss changes in her blood pressures, parameters of when to hold her medications and other questions or concerns about heart healthy with provider; Provided education on prescribed diet heart healthy;  Discussed complications of poorly controlled blood pressure such as heart disease, stroke, circulatory complications, vision complications, kidney impairment, sexual dysfunction;  Screening for signs and symptoms of depression related to chronic disease state;  Assessed social determinant of health barriers;  The patient has been having some hypotension since starting blood pressure medications. She has stopped on her own with taking the medications as she felt her blood pressure was too low. She states her blood pressure only goes up if she is stressed.   Symptom Management: Take medications as prescribed   Attend all scheduled provider appointments Call pharmacy for medication refills 3-7 days in advance of running out of medications Call provider office for new concerns or questions  call the Suicide and Crisis Lifeline: 988 call the [place] National Suicide Prevention Lifeline: 731-032-8980 or TTY: 575-346-7215 TTY 724 635 5551) to talk to a trained counselor call 1-800-273-TALK (toll free, 24 hour hotline) if experiencing a Mental Health or [place]  check blood pressure 3 times per  week learn about high blood pressure keep a blood pressure log take blood pressure log to all doctor appointments call doctor for signs and symptoms of high blood pressure develop an action plan for high blood pressure keep all doctor appointments take medications for blood pressure exactly as prescribed report new symptoms to your doctor  Follow Up Plan: Telephone follow up appointment with care management team member scheduled for: 07-27-2022 at 1 pm          The patient verbalized understanding of instructions, educational materials, and care plan provided today and DECLINED offer to receive copy of patient instructions, educational materials, and care plan.   Telephone follow up appointment with care management team member scheduled for: 07-27-2022 at 1 pm

## 2022-06-04 ENCOUNTER — Telehealth: Payer: Self-pay | Admitting: Internal Medicine

## 2022-06-04 NOTE — Telephone Encounter (Signed)
Contacted Katrina Sutton to schedule their annual wellness visit. Appointment made for 07/08/2022.  Sherol Dade; Care Guide Ambulatory Clinical [facility] Direct Dial: [phone]

## 2022-06-04 NOTE — Telephone Encounter (Signed)
Called patient to schedule Medicare Annual Wellness Visit (AWV). Left message for patient to call back and schedule Medicare Annual Wellness Visit (AWV).  Last date of AWV: 07/02/21  Please schedule an appointment at any time with Kirke Shaggy, LPN after [phone] on AWV schedule. .  If any questions, please contact me.  Thank you ,  Sherol Dade; [place] Direct Dial: [phone]

## 2022-06-10 ENCOUNTER — Other Ambulatory Visit: Payer: Self-pay | Admitting: Internal Medicine

## 2022-06-10 NOTE — Telephone Encounter (Signed)
Requested medication (s) are due for refill today: yes  Requested medication (s) are on the active medication list: yes  Last refill:  04/20/22 #90/0  Future visit scheduled: no  Notes to clinic:  Unable to refill per protocol, cannot delegate.    Requested Prescriptions  Pending Prescriptions Disp Refills   clonazePAM (KLONOPIN) 0.5 MG tablet [Pharmacy Med Name: CLONAZEPAM 0.5 MG TAB] 90 tablet     Sig: TAKE 1 TABLET BY MOUTH 3 TIMES A DAY AS NEEDED     Not Delegated - Psychiatry: Anxiolytics/Hypnotics 2 Failed - 06/10/2022  1:08 PM      Failed - This refill cannot be delegated      Failed - Urine Drug Screen completed in last 360 days      Passed - Patient is not pregnant      Passed - Valid encounter within last 6 months    Recent Outpatient Visits           4 months ago Primary hypertension   [place], Coralie Keens, NP   5 months ago Encounter for general adult medical examination with abnormal findings   [facility] [place], Coralie Keens, NP   6 months ago [place], RPH-CPP   11 months ago Aortic atherosclerosis [facility])   [facility] [place], Coralie Keens, NP   1 year ago Medicare annual wellness visit, subsequent   [facility] [place], Coralie Keens, [place]

## 2022-06-13 DIAGNOSIS — I10 Essential (primary) hypertension: Secondary | ICD-10-CM | POA: Diagnosis not present

## 2022-06-13 DIAGNOSIS — F3189 Other bipolar disorder: Secondary | ICD-10-CM | POA: Diagnosis not present

## 2022-06-24 ENCOUNTER — Ambulatory Visit (INDEPENDENT_AMBULATORY_CARE_PROVIDER_SITE_OTHER): Payer: Medicare HMO | Admitting: Internal Medicine

## 2022-06-24 ENCOUNTER — Encounter: Payer: Self-pay | Admitting: Internal Medicine

## 2022-06-24 VITALS — BP 126/74 | HR 98 | Temp 96.7°F | Wt 155.0 lb

## 2022-06-24 DIAGNOSIS — F319 Bipolar disorder, unspecified: Secondary | ICD-10-CM

## 2022-06-24 DIAGNOSIS — K219 Gastro-esophageal reflux disease without esophagitis: Secondary | ICD-10-CM

## 2022-06-24 DIAGNOSIS — E7800 Pure hypercholesterolemia, unspecified: Secondary | ICD-10-CM | POA: Diagnosis not present

## 2022-06-24 DIAGNOSIS — K5901 Slow transit constipation: Secondary | ICD-10-CM

## 2022-06-24 DIAGNOSIS — R7309 Other abnormal glucose: Secondary | ICD-10-CM

## 2022-06-24 DIAGNOSIS — I700 Atherosclerosis of aorta: Secondary | ICD-10-CM | POA: Diagnosis not present

## 2022-06-24 DIAGNOSIS — I10 Essential (primary) hypertension: Secondary | ICD-10-CM

## 2022-06-24 DIAGNOSIS — F411 Generalized anxiety disorder: Secondary | ICD-10-CM

## 2022-06-24 DIAGNOSIS — M1990 Unspecified osteoarthritis, unspecified site: Secondary | ICD-10-CM | POA: Diagnosis not present

## 2022-06-24 DIAGNOSIS — R739 Hyperglycemia, unspecified: Secondary | ICD-10-CM

## 2022-06-24 DIAGNOSIS — E663 Overweight: Secondary | ICD-10-CM

## 2022-06-24 DIAGNOSIS — Z6826 Body mass index (BMI) 26.0-26.9, adult: Secondary | ICD-10-CM

## 2022-06-24 MED ORDER — PANTOPRAZOLE SODIUM 40 MG PO TBEC: 40.0000 mg | DELAYED_RELEASE_TABLET | Freq: Two times a day (BID) | ORAL | 1 refills | Status: DC

## 2022-06-24 NOTE — Assessment & Plan Note (Signed)
Encourage diet and exercise for weight loss 

## 2022-06-24 NOTE — Assessment & Plan Note (Signed)
Continue Tylenol as needed 

## 2022-06-24 NOTE — Assessment & Plan Note (Signed)
Continue paroxetine and clonazepam Support offered 

## 2022-06-24 NOTE — Assessment & Plan Note (Signed)
Continue MiraLAX as needed Encouraged high-fiber diet and adequate water intake

## 2022-06-24 NOTE — Assessment & Plan Note (Signed)
C-Met and lipid profile today Encouraged her to consume low-fat diet She is not currently taking a statin but is taking aspirin

## 2022-06-24 NOTE — Assessment & Plan Note (Signed)
C-Met and lipid profile today Encouraged her to consume low-fat diet 

## 2022-06-24 NOTE — Patient Instructions (Signed)

## 2022-06-24 NOTE — Assessment & Plan Note (Signed)
Controlled off meds.

## 2022-06-24 NOTE — Assessment & Plan Note (Signed)
Continue paroxetine and clonazepam Support offered

## 2022-06-24 NOTE — Assessment & Plan Note (Signed)
Increase pantoprazole to 40 mg twice daily
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
## 2022-06-25 LAB — COMPLETE METABOLIC PANEL WITH GFR
AG Ratio: 1.6 (calc) (ref 1.0–2.5)
ALT: 10 U/L (ref 6–29)
AST: 18 U/L (ref 10–35)
Albumin: 4.2 g/dL (ref 3.6–5.1)
Alkaline phosphatase (APISO): 117 U/L (ref 37–153)
BUN/Creatinine Ratio: 9 (calc) (ref 6–22)
BUN: 10 mg/dL (ref 7–25)
CO2: 28 mmol/L (ref 20–32)
Calcium: 9.7 mg/dL (ref 8.6–10.4)
Chloride: 102 mmol/L (ref 98–110)
Creat: 1.15 mg/dL — ABNORMAL HIGH (ref 0.60–1.00)
Globulin: 2.7 g/dL (calc) (ref 1.9–3.7)
Glucose, Bld: 106 mg/dL — ABNORMAL HIGH (ref 65–99)
Potassium: 4.2 mmol/L (ref 3.5–5.3)
Sodium: 140 mmol/L (ref 135–146)
Total Bilirubin: 1.2 mg/dL (ref 0.2–1.2)
Total Protein: 6.9 g/dL (ref 6.1–8.1)
eGFR: 50 mL/min/{1.73_m2} — ABNORMAL LOW (ref >=60)

## 2022-06-25 LAB — CBC
HCT: 43.5 % (ref 35.0–45.0)
Hemoglobin: 14.7 g/dL (ref 11.7–15.5)
MCH: 29.8 pg (ref 27.0–33.0)
MCHC: 33.8 g/dL (ref 32.0–36.0)
MCV: 88.2 fL (ref 80.0–100.0)
MPV: 9.3 fL (ref 7.5–12.5)
Platelets: 334 10*3/uL (ref 140–400)
RBC: 4.93 10*6/uL (ref 3.80–5.10)
RDW: 13.3 % (ref 11.0–15.0)
WBC: 9.9 10*3/uL (ref 3.8–10.8)

## 2022-06-25 LAB — LIPID PANEL
Cholesterol: 202 mg/dL — ABNORMAL HIGH (ref <200)
HDL: 57 mg/dL (ref >=50)
LDL Cholesterol (Calc): 122 mg/dL (calc) — ABNORMAL HIGH
Non-HDL Cholesterol (Calc): 145 mg/dL (calc) — ABNORMAL HIGH (ref <130)
Total CHOL/HDL Ratio: 3.5 (calc) (ref <5.0)
Triglycerides: 118 mg/dL (ref <150)

## 2022-06-25 LAB — HEMOGLOBIN A1C
Hgb A1c MFr Bld: 5.7 % of total Hgb — ABNORMAL HIGH (ref <5.7)
Mean Plasma Glucose: 117 mg/dL
eAG (mmol/L): 6.5 mmol/L

## 2022-06-28 MED ORDER — SIMVASTATIN 10 MG PO TABS: 10.0000 mg | ORAL_TABLET | Freq: Every day | ORAL | 1 refills | Status: DC

## 2022-06-28 NOTE — Addendum Note (Signed)
Addended by: Lorre Munroe on: 06/28/2022 10:54 AM   Modules accepted: Orders

## 2022-07-07 ENCOUNTER — Other Ambulatory Visit: Payer: Self-pay | Admitting: Internal Medicine

## 2022-07-08 ENCOUNTER — Ambulatory Visit (INDEPENDENT_AMBULATORY_CARE_PROVIDER_SITE_OTHER): Payer: Medicare HMO

## 2022-07-08 VITALS — Ht 64.0 in | Wt 155.0 lb

## 2022-07-08 DIAGNOSIS — Z0000 Encounter for general adult medical examination without abnormal findings: Secondary | ICD-10-CM | POA: Diagnosis not present

## 2022-07-08 NOTE — Patient Instructions (Signed)
Katrina Sutton , Thank you for taking time to come for your Medicare Wellness Visit. I appreciate your ongoing commitment to your health goals. Please review the following plan we discussed and let me know if I can assist you in the future.   These are the goals we discussed:  Goals      Activity and Exercise Increased     Evidence-based guidance:  Review current exercise levels.  Assess patient perspective on exercise or activity level, barriers to increasing activity, motivation and readiness for change.  Recommend or set healthy exercise goal based on individual tolerance.  Encourage small steps toward making change in amount of exercise or activity.  Urge reduction of sedentary activities or screen time.  Promote group activities within the community or with family or support person.  Consider referral to rehabiliation therapist for assessment and exercise/activity plan.   Notes:      CCM Expected Outcome:  Monitor, Self-Manage and Reduce Symptoms of: GERD     Current Barriers:  Knowledge Deficits related to how to control triggers that cause exacerbation of GERD Care Coordination needs related to resources and ongoing education needs  in a patient with GERD Chronic Disease Management support and education needs related to Effective management of GERD Lacks caregiver support.   Planned Interventions: Evaluation of current treatment plan related to GERD and patient's adherence to plan as established by provider. The patient states her GERD and GI sx and sx are stable. She has gotten her bowel habits stable and is happy about this. She did have a stomach virus but is over this now. Is monitoring what she eats and is stable. Feels good.  Advised patient to call the office for changes in her sx and sx of GERD Provided education to patient re: foods to avoid that will cause exacerbation, monitoring for changes, keeping stress levels down, and notifying the provider for changes in her condition.  Review of dietary habits Reviewed medications with patient and discussed compliance, the patient states compliance with medications Reviewed scheduled/upcoming provider appointments including 06-24-2022 at 120 pm Discussed plans with patient for ongoing care management follow up and provided patient with direct contact information for care management team Advised patient to discuss changes in her GERD, questions or concerns with provider Screening for signs and symptoms of depression related to chronic disease state  Assessed social determinant of health barriers  Symptom Management: Take medications as prescribed   Attend all scheduled provider appointments Call provider office for new concerns or questions  call the Suicide and Crisis Lifeline: 988 call the [place] National Suicide Prevention Lifeline: (878)284-1263 or TTY: (205) 300-0889 TTY 515-098-0022) to talk to a trained counselor call 1-800-273-TALK (toll free, 24 hour hotline) if experiencing a Mental Health or Behavioral Health Crisis   Follow Up Plan: Telephone follow up appointment with care management team member scheduled for: 07-27-2022  at 1 pm       CCM Expected Outcome:  Monitor, Self-Manage and Reduce Symptoms [id number] disorder and anxiety     Current Barriers:  Knowledge Deficits related to not understanding why her urine test did not show positive for her klonopin as she takes it as directed.  Care Coordination needs related to ongoing education and support needed  in a patient with bipolar disorder and anxiety Chronic Disease Management support and education needs related to effective management of bipolar disorder and anxiety Lacks caregiver support.   Planned Interventions: Evaluation of current treatment plan related to bipolar disorder and anxiety and patient's  adherence to plan as established by provider. The patient is having a good day today and states that she just takes it one day at a time and does the best  she can. She states sometimes she gets easily overwhelmed as she often does at times. She had a bad experience on Friday at a restaurant but she left and she feels fine now. She believes this was meant to be so she would not go back there. Reflective listening and support given. Will continue to monitor for changes. She says things have settled down a lot with her husband. He is working at [employer] and is at the [employer] on the weekends.   Advised patient to call the office for changes in her mood, anxiety, depression or mental health needs  Provided education to patient re: The patient states she is happy that she got her medication refills. Sometimes she gets confused about test results and testing but states she is happy for the support of the [facility] and her providers.   Reviewed medications with patient and discussed compliance. The patient endorses compliance with medications  Reviewed scheduled/upcoming provider appointments including 06-24-2022 at 120 pm Discussed plans with patient for ongoing care management follow up and provided patient with direct contact information for care management team Advised patient to discuss changes in her mental health needs with provider Screening for signs and symptoms of depression related to chronic disease state  Assessed social determinant of health barriers  Symptom Management: Take medications as prescribed   Attend all scheduled provider appointments Call provider office for new concerns or questions  Work with the social worker to address care coordination needs and will continue to work with the clinical team to address health care and disease management related needs call the Suicide and Crisis Lifeline: 988 call the [place] National Suicide Prevention Lifeline: 302 679 7826 or TTY: 815-782-1362 TTY 220-281-4435) to talk to a trained counselor call 1-800-273-TALK (toll free, 24 hour hotline) if experiencing a Mental Health or Behavioral Health Crisis    Follow Up Plan: Telephone follow up appointment with care management team member scheduled for: 07-27-2022 at 1 pm       CCM Expected Outcome:  Monitor, Self-Manage, and Reduce Symptoms of Hypertension     Current Barriers:  Knowledge Deficits related to controlling blood pressures and monitoring for factors that stress her out and increase her blood pressures Care Coordination needs related to patient dealing with some hypotension since starting medications and not knowing when to take medications or not in a patient with HTN Chronic Disease Management support and education needs related to effective management of HTN Lacks caregiver support.  BP Readings from Last 3 Encounters:  01/22/22 132/84  01/08/22 (!) 142/94  07/09/21 132/78     Planned Interventions: Evaluation of current treatment plan related to hypertension self management and patient's adherence to plan as established by provider. The patient denies any acute changes in her HTN or heart health. She does have an elevation in her blood pressures when she gets stressed out or overwhelmed. She feels like her blood pressure was a little elevated on Friday when she had an incident at a place she goes to eat but after she left and decided she would not ever go back she feels so much better. She feels like that is her way of being protected.    Provided education to patient re: stroke prevention, s/s of heart attack and stroke; Reviewed prescribed diet heart healthy diet. The patient is compliant  with heart healthy diet. Eating well and denies any acute changes in her dietary habits.  Reviewed medications with patient and discussed importance of compliance. The patient is compliant with medications.;  Discussed plans with patient for ongoing care management follow up and provided patient with direct contact information for care management team; Advised patient, providing education and rationale, to monitor blood pressure daily and  record, calling PCP for findings outside established parameters;  Reviewed scheduled/upcoming provider appointments including: 06-24-2022 at 120 pm. Knows to call for changes or needs  Advised patient to discuss changes in her blood pressures, parameters of when to hold her medications and other questions or concerns about heart healthy with provider; Provided education on prescribed diet heart healthy;  Discussed complications of poorly controlled blood pressure such as heart disease, stroke, circulatory complications, vision complications, kidney impairment, sexual dysfunction;  Screening for signs and symptoms of depression related to chronic disease state;  Assessed social determinant of health barriers;  The patient has been having some hypotension since starting blood pressure medications. She has stopped on her own with taking the medications as she felt her blood pressure was too low. She states her blood pressure only goes up if she is stressed.   Symptom Management: Take medications as prescribed   Attend all scheduled provider appointments Call pharmacy for medication refills 3-7 days in advance of running out of medications Call provider office for new concerns or questions  call the Suicide and Crisis Lifeline: 988 call the [place] National Suicide Prevention Lifeline: 8576663835 or TTY: (918) 061-4368 TTY 651-599-5833) to talk to a trained counselor call 1-800-273-TALK (toll free, 24 hour hotline) if experiencing a Mental Health or Behavioral Health Crisis  check blood pressure 3 times per week learn about high blood pressure keep a blood pressure log take blood pressure log to all doctor appointments call doctor for signs and symptoms of high blood pressure develop an action plan for high blood pressure keep all doctor appointments take medications for blood pressure exactly as prescribed report new symptoms to your doctor  Follow Up Plan: Telephone follow up appointment with  care management team member scheduled for: 07-27-2022 at 1 pm       DIET - EAT MORE FRUITS AND VEGETABLES     Patient Stated     06/24/2020, stay healthy     Pharmacy Goal     Please let me know if you have additional questions or concerns about your medications in the future  Estelle Grumbles, PharmD, BCACP Clinical Pharmacist [facility] [phone]         This is a list of the screening recommended for you and due dates:  Health Maintenance  Topic Date Due   Zoster (Shingles) Vaccine (1 of 2) Never done   Colon Cancer Screening  Never done   Mammogram  Never done   DEXA scan (bone density measurement)  Never done   Flu Shot  10/14/2022   Medicare Annual Wellness Visit  07/08/2023   DTaP/Tdap/Td vaccine (2 - Td or Tdap) 09/12/2025   Hepatitis C Screening: USPSTF Recommendation to screen - Ages 18-79 yo.  Completed   HPV Vaccine  Aged Out   Pneumonia Vaccine  Discontinued   COVID-19 Vaccine  Discontinued    Advanced directives: no  Conditions/risks identified: none  Next appointment: Follow up in one year for your annual wellness visit 07/14/23 @ 2:30 PM BY PHONE   Preventive Care 65 Years and Older, Female Preventive care refers  to lifestyle choices and visits with your health care provider that can promote health and wellness. What does preventive care include? A yearly physical exam. This is also called an annual well check. Dental exams once or twice a year. Routine eye exams. Ask your health care provider how often you should have your eyes checked. Personal lifestyle choices, including: Daily care of your teeth and gums. Regular physical activity. Eating a healthy diet. Avoiding tobacco and drug use. Limiting alcohol use. Practicing safe sex. Taking low-dose aspirin every day. Taking vitamin and mineral supplements as recommended by your health care provider. What happens during an annual well check? The services and  screenings done by your health care provider during your annual well check will depend on your age, overall health, lifestyle risk factors, and family history of disease. Counseling  Your health care provider may ask you questions about your: Alcohol use. Tobacco use. Drug use. Emotional well-being. Home and relationship well-being. Sexual activity. Eating habits. History of falls. Memory and ability to understand (cognition). Work and work Astronomer. Reproductive health. Screening  You may have the following tests or measurements: Height, weight, and BMI. Blood pressure. Lipid and cholesterol levels. These may be checked every 5 years, or more frequently if you are over 24 years old. Skin check. Lung cancer screening. You may have this screening every year starting at age 108 if you have a 30-pack-year history of smoking and currently smoke or have quit within the past 15 years. Fecal occult blood test (FOBT) of the stool. You may have this test every year starting at age 48. Flexible sigmoidoscopy or colonoscopy. You may have a sigmoidoscopy every 5 years or a colonoscopy every 10 years starting at age 10. Hepatitis C blood test. Hepatitis B blood test. Sexually transmitted disease (STD) testing. Diabetes screening. This is done by checking your blood sugar (glucose) after you have not eaten for a while (fasting). You may have this done every 1-3 years. Bone density scan. This is done to screen for osteoporosis. You may have this done starting at age 33. Mammogram. This may be done every 1-2 years. Talk to your health care provider about how often you should have regular mammograms. Talk with your health care provider about your test results, treatment options, and if necessary, the need for more tests. Vaccines  Your health care provider may recommend certain vaccines, such as: Influenza vaccine. This is recommended every year. Tetanus, diphtheria, and acellular pertussis (Tdap,  Td) vaccine. You may need a Td booster every 10 years. Zoster vaccine. You may need this after age 46. Pneumococcal 13-valent conjugate (PCV13) vaccine. One dose is recommended after age 58. Pneumococcal polysaccharide (PPSV23) vaccine. One dose is recommended after age 90. Talk to your health care provider about which screenings and vaccines you need and how often you need them. This information is not intended to replace advice given to you by your health care provider. Make sure you discuss any questions you have with your health care provider. Document Released: 03/28/2015 Document Revised: 11/19/2015 Document Reviewed: 12/31/2014 Elsevier Interactive Patient Education  2017 [employer].  Fall Prevention in the Home Falls can cause injuries. They can happen to people of all ages. There are many things you can do to make your home safe and to help prevent falls. What can I do on the outside of my home? Regularly fix the edges of walkways and driveways and fix any cracks. Remove anything that might make you trip as you walk through  a door, such as a raised step or threshold. Trim any bushes or trees on the path to your home. Use bright outdoor lighting. Clear any walking paths of anything that might make someone trip, such as rocks or tools. Regularly check to see if handrails are loose or broken. Make sure that both sides of any steps have handrails. Any raised decks and porches should have guardrails on the edges. Have any leaves, snow, or ice cleared regularly. Use sand or salt on walking paths during winter. Clean up any spills in your garage right away. This includes oil or grease spills. What can I do in the bathroom? Use night lights. Install grab bars by the toilet and in the tub and shower. Do not use towel bars as grab bars. Use non-skid mats or decals in the tub or shower. If you need to sit down in the shower, use a plastic, non-slip stool. Keep the floor dry. Clean up any  water that spills on the floor as soon as it happens. Remove soap buildup in the tub or shower regularly. Attach bath mats securely with double-sided non-slip rug tape. Do not have throw rugs and other things on the floor that can make you trip. What can I do in the bedroom? Use night lights. Make sure that you have a light by your bed that is easy to reach. Do not use any sheets or blankets that are too big for your bed. They should not hang down onto the floor. Have a firm chair that has side arms. You can use this for support while you get dressed. Do not have throw rugs and other things on the floor that can make you trip. What can I do in the kitchen? Clean up any spills right away. Avoid walking on wet floors. Keep items that you use a lot in easy-to-reach places. If you need to reach something above you, use a strong step stool that has a grab bar. Keep electrical cords out of the way. Do not use floor polish or wax that makes floors slippery. If you must use wax, use non-skid floor wax. Do not have throw rugs and other things on the floor that can make you trip. What can I do with my stairs? Do not leave any items on the stairs. Make sure that there are handrails on both sides of the stairs and use them. Fix handrails that are broken or loose. Make sure that handrails are as long as the stairways. Check any carpeting to make sure that it is firmly attached to the stairs. Fix any carpet that is loose or worn. Avoid having throw rugs at the top or bottom of the stairs. If you do have throw rugs, attach them to the floor with carpet tape. Make sure that you have a light switch at the top of the stairs and the bottom of the stairs. If you do not have them, ask someone to add them for you. What else can I do to help prevent falls? Wear shoes that: Do not have high heels. Have rubber bottoms. Are comfortable and fit you well. Are closed at the toe. Do not wear sandals. If you use a  stepladder: Make sure that it is fully opened. Do not climb a closed stepladder. Make sure that both sides of the stepladder are locked into place. Ask someone to hold it for you, if possible. Clearly mark and make sure that you can see: Any grab bars or handrails. First and last  steps. Where the edge of each step is. Use tools that help you move around (mobility aids) if they are needed. These include: Canes. Walkers. Scooters. Crutches. Turn on the lights when you go into a dark area. Replace any light bulbs as soon as they burn out. Set up your furniture so you have a clear path. Avoid moving your furniture around. If any of your floors are uneven, fix them. If there are any pets around you, be aware of where they are. Review your medicines with your doctor. Some medicines can make you feel dizzy. This can increase your chance of falling. Ask your doctor what other things that you can do to help prevent falls. This information is not intended to replace advice given to you by your health care provider. Make sure you discuss any questions you have with your health care provider. Document Released: 12/26/2008 Document Revised: 08/07/2015 Document Reviewed: 04/05/2014 Elsevier Interactive Patient Education  2017 [employer].

## 2022-07-08 NOTE — Telephone Encounter (Signed)
Requested medication (s) are due for refill today:   Provider to review  Requested medication (s) are on the active medication list:   Yes  Future visit scheduled:   Yes 01/13/2023   Last ordered: 06/11/2022 #90, 0 refills  Pt asked for a refill to be refilled at the appropriate time per pharmacy  Non delegated refill reason returned     Requested Prescriptions  Pending Prescriptions Disp Refills   clonazePAM (KLONOPIN) 0.5 MG tablet [Pharmacy Med Name: CLONAZEPAM 0.5 MG TAB] 90 tablet     Sig: TAKE 1 TABLET BY MOUTH 3 TIMES A DAY AS NEEDED     Not Delegated - Psychiatry: Anxiolytics/Hypnotics 2 Failed - 07/07/2022  5:14 PM      Failed - This refill cannot be delegated      Failed - Urine Drug Screen completed in last 360 days      Passed - Patient is not pregnant      Passed - Valid encounter within last 6 months    Recent Outpatient Visits           2 weeks ago Aortic atherosclerosis   [place] [facility] [place], Salvadore Oxford, NP   5 months ago Primary hypertension   [place] [facility] [place], Salvadore Oxford, NP   6 months ago Encounter for general adult medical examination with abnormal findings   [place] [facility] [place], Salvadore Oxford, NP   7 months ago Anxiety   [place] [facility] [place], Gentry Fitz A, RPH-CPP   12 months ago Aortic atherosclerosis [facility])   [place] [facility] [place], Salvadore Oxford, NP       Future Appointments             In 6 months [place], Salvadore Oxford, NP [place] [facility], [facility]

## 2022-07-08 NOTE — Progress Notes (Signed)
I connected with  Paeton T Kooi on 07/08/22 by a audio enabled telemedicine application and verified that I am speaking with the correct person using two identifiers.  Patient Location: Home  Provider Location: Office/Clinic  I discussed the limitations of evaluation and management by telemedicine. The patient expressed understanding and agreed to proceed.  Subjective:   Katrina Sutton is a 72 y.o. female who presents for Medicare Annual (Subsequent) preventive examination.  Review of Systems     Cardiac Risk Factors include: advanced age (>65men, >85 women);dyslipidemia;sedentary lifestyle;hypertension     Objective:    There were no vitals filed for this visit. There is no height or weight on file to calculate BMI.     07/08/2022    3:08 PM 07/02/2021    2:20 PM 06/24/2020    2:59 PM 10/27/2019   12:40 AM 10/26/2019    9:23 PM 10/12/2019    6:13 PM 10/11/2019    5:24 PM  Advanced Directives  Does Patient Have a Medical Advance Directive? Yes No No No No  No  Type of Estate agent of [place];Living will        Does patient want to make changes to medical advance directive? No - Patient declined        Copy of Healthcare Power of Attorney in Chart? Yes - validated most recent copy scanned in chart (See row information)        Would patient like information on creating a medical advance directive?  No - Patient declined  No - Patient declined No - Patient declined No - Patient declined     Current Medications (verified) Outpatient Encounter Medications as of 07/08/2022  Medication Sig   acetaminophen (TYLENOL) 500 MG tablet Take 500 mg by mouth every 8 (eight) hours as needed for headache.   aspirin 81 MG EC tablet Take 1 tablet (81 mg total) by mouth daily. Swallow whole.   clonazePAM (KLONOPIN) 0.5 MG tablet TAKE 1 TABLET BY MOUTH 3 TIMES A DAY AS NEEDED   hydrocortisone cream 1 % Apply 1 application topically 2 (two) times daily as needed for itching.     pantoprazole (PROTONIX) 40 MG tablet Take 1 tablet (40 mg total) by mouth 2 (two) times daily.   PARoxetine (PAXIL) 40 MG tablet TAKE 1 TABLET BY MOUTH DAILY   polyethylene glycol (MIRALAX / GLYCOLAX) 17 g packet Take 17 g by mouth daily.   simvastatin (ZOCOR) 10 MG tablet Take 1 tablet (10 mg total) by mouth at bedtime.   No facility-administered encounter medications on file as of 07/08/2022.    Allergies (verified) Erythromycin and Duloxetine   History: Past Medical History:  Diagnosis Date   Allergy    seasonal   Anxiety    Arthritis    osteoarthritis   Depression    GERD (gastroesophageal reflux disease)    Hiatal hernia    Past Surgical History:  Procedure Laterality Date   CHOLECYSTECTOMY N/A 05/31/2018   Procedure: LAPAROSCOPIC CHOLECYSTECTOMY;  Surgeon: Duanne Guess, MD;  Location: [facility] ORS;  Service: General;  Laterality: N/A;   TONSILLECTOMY     UMBILICAL HERNIA REPAIR N/A 05/31/2018   Procedure: HERNIA REPAIR UMBILICAL ADULT;  Surgeon: Duanne Guess, MD;  Location: [facility] ORS;  Service: General;  Laterality: N/A;   UMBILICAL HERNIA REPAIR N/A 10/31/2019   Procedure: HERNIA REPAIR UMBILICAL ADULT;  Surgeon: Duanne Guess, MD;  Location: [facility] OR[facility];  Service: General;  Laterality: N/A;   WRIST SURGERY Left  Family History  Problem Relation Age of Onset   Mental illness Mother    Ulcers Father    Stroke Neg Hx    Heart attack Neg Hx    Cancer Neg Hx    Social History   Socioeconomic History   Marital status: Married    Spouse name: Bronwen Pendergraft   Number of children: 1   Years of education: Not on file   Highest education level: Not on file  Occupational History   Occupation: retired  Tobacco Use   Smoking status: Former    Types: Cigarettes    Quit date: 12/27/1976    Years since quitting: 45.5   Smokeless tobacco: Never  Vaping Use   Vaping Use: Never used  Substance and Sexual Activity   Alcohol use: No   Drug use: No   Sexual activity: Not  Currently    Birth control/protection: None  Other Topics Concern   Not on file  Social History Narrative   Not on file   Social Determinants of Health   Financial Resource Strain: Low Risk  (07/08/2022)   Overall Financial Resource Strain (CARDIA)    Difficulty of Paying Living Expenses: Not hard at all  Food Insecurity: No Food Insecurity (07/08/2022)   Hunger Vital Sign    Worried About Running Out of Food in the Last Year: Never true    Ran Out of Food in the Last Year: Never true  Transportation Needs: No Transportation Needs (07/08/2022)   PRAPARE - Administrator, Civil Service (Medical): No    Lack of Transportation (Non-Medical): No  Physical Activity: Insufficiently Active (07/08/2022)   Exercise Vital Sign    Days of Exercise per Week: 3 days    Minutes of Exercise per Session: 20 min  Stress: No Stress Concern Present (07/08/2022)   [employer] of Occupational Health - Occupational Stress Questionnaire    Feeling of Stress : Not at all  Social Connections: Moderately Isolated (07/08/2022)   Social Connection and Isolation Panel [NHANES]    Frequency of Communication with Friends and Family: Twice a week    Frequency of Social Gatherings with Friends and Family: Once a week    Attends Religious Services: Never    Database administrator or Organizations: No    Attends Engineer, structural: Never    Marital Status: Married    Tobacco Counseling Counseling given: Not Answered   Clinical Intake:  Pre-visit preparation completed: Yes  Pain : No/denies pain     Nutritional Risks: None Diabetes: No  How often do you need to have someone help you when you read instructions, pamphlets, or other written materials from your doctor or pharmacy?: 1 - Never  Diabetic?no  Interpreter Needed?: No  Information entered by :: Kennedy Bucker, LPN   Activities of Daily Living    07/08/2022    3:10 PM 06/24/2022    1:20 PM  In your present state  of health, do you have any difficulty performing the following activities:  Hearing? 1 1  Vision? 0 1  Difficulty concentrating or making decisions? 0 0  Walking or climbing stairs? 0 0  Dressing or bathing? 0 0  Doing errands, shopping? 0 0  Preparing Food and eating ? N   Using the Toilet? N   In the past six months, have you accidently leaked urine? N   Do you have problems with loss of bowel control? N   Managing your Medications? N  Managing your Finances? N   Housekeeping or managing your Housekeeping? N     Patient Care Team: Lorre Munroe, NP as PCP - General (Internal Medicine) Marlowe Sax, RN as Case Manager (General Practice)  Indicate any recent Medical Services you may have received from other than [facility] providers in the past year (date may be approximate).     Assessment:   This is a routine wellness examination for Vergia.  Hearing/Vision screen Hearing Screening - Comments:: No aids, deaf in right ear Vision Screening - Comments:: Wears glasses- Dr.in [place]  Dietary issues and exercise activities discussed: Current Exercise Habits: Home exercise routine, Time (Minutes): 20, Frequency (Times/Week): 3, Weekly Exercise (Minutes/Week): 60, Intensity: Mild   Goals Addressed             This Visit's Progress    DIET - EAT MORE FRUITS AND VEGETABLES         Depression Screen    07/08/2022    3:06 PM 06/24/2022    1:20 PM 01/22/2022    2:00 PM 01/08/2022    1:16 PM 07/02/2021    2:21 PM 01/12/2021    1:36 PM 07/11/2020    2:21 PM  PHQ 2/9 Scores  PHQ - 2 Score 0 0 0 3 1 3 2   PHQ- 9 Score 0   10 5 10 3     Fall Risk    07/08/2022    3:10 PM 06/24/2022    1:20 PM 03/29/2022    1:34 PM 01/22/2022    2:01 PM 01/14/2022   12:32 PM  Fall Risk   Falls in the past year? 0 0 0 0 0  Number falls in past yr: 0  0  0  Injury with Fall? 0 0 0 0 0  Risk for fall due to : No Fall Risks No Fall Risks No Fall Risks  No Fall Risks  Follow up Falls  prevention discussed;Falls evaluation completed  Falls evaluation completed;Education provided  Falls evaluation completed    FALL RISK PREVENTION PERTAINING TO THE HOME:  Any stairs in or around the home? No  If so, are there any without handrails? No  Home free of loose throw rugs in walkways, pet beds, electrical cords, etc? Yes  Adequate lighting in your home to reduce risk of falls? Yes   ASSISTIVE DEVICES UTILIZED TO PREVENT FALLS:  Life alert? No  Use of a cane, walker or w/c? No  Grab bars in the bathroom? No  Shower chair or bench in shower? No  Elevated toilet seat or a handicapped toilet? No    Cognitive Function:        07/08/2022    3:13 PM 07/02/2021    2:24 PM 06/24/2020    3:02 PM  6CIT Screen  What Year? 0 points 0 points 0 points  What month? 0 points 0 points 0 points  What time? 0 points 0 points 0 points  Count back from 20 0 points 0 points 0 points  Months in reverse 0 points 0 points 0 points  Repeat phrase 0 points 2 points 0 points  Total Score 0 points 2 points 0 points    Immunizations Immunization History  Administered Date(s) Administered   Tdap 09/13/2015    TDAP status: Up to date  Flu Vaccine status: Declined, Education has been provided regarding the importance of this vaccine but patient still declined. Advised may receive this vaccine at local pharmacy or Health Dept. Aware to provide a copy of  the vaccination record if obtained from local pharmacy or Health Dept. Verbalized acceptance and understanding.  Pneumococcal vaccine status: Declined,  Education has been provided regarding the importance of this vaccine but patient still declined. Advised may receive this vaccine at local pharmacy or Health Dept. Aware to provide a copy of the vaccination record if obtained from local pharmacy or Health Dept. Verbalized acceptance and understanding.   Covid-19 vaccine status: Declined, Education has been provided regarding the importance of  this vaccine but patient still declined. Advised may receive this vaccine at local pharmacy or Health Dept.or vaccine clinic. Aware to provide a copy of the vaccination record if obtained from local pharmacy or Health Dept. Verbalized acceptance and understanding.  Qualifies for Shingles Vaccine? Yes   Zostavax completed No   Shingrix Completed?: No.    Education has been provided regarding the importance of this vaccine. Patient has been advised to call insurance company to determine out of pocket expense if they have not yet received this vaccine. Advised may also receive vaccine at local pharmacy or Health Dept. Verbalized acceptance and understanding.  Screening Tests Health Maintenance  Topic Date Due   Zoster Vaccines- Shingrix (1 of 2) Never done   COLONOSCOPY (Pts 45-36yrs Insurance coverage will need to be confirmed)  Never done   MAMMOGRAM  Never done   DEXA SCAN  Never done   INFLUENZA VACCINE  10/14/2022   Medicare Annual Wellness (AWV)  07/08/2023   DTaP/Tdap/Td (2 - Td or Tdap) 09/12/2025   Hepatitis C Screening  Completed   HPV VACCINES  Aged Out   Pneumonia Vaccine 74+ Years old  Discontinued   COVID-19 Vaccine  Discontinued    Health Maintenance  Health Maintenance Due  Topic Date Due   Zoster Vaccines- Shingrix (1 of 2) Never done   COLONOSCOPY (Pts 45-40yrs Insurance coverage will need to be confirmed)  Never done   MAMMOGRAM  Never done   DEXA SCAN  Never done    Colorectal cancer screening: No longer required.   Mammogram status: No longer required due to age.  Declined referral for BDS  Lung Cancer Screening: (Low Dose CT Chest recommended if Age 58-80 years, 30 pack-year currently smoking OR have quit w/in 15years.) does not qualify.   Additional Screening:  Hepatitis C Screening: does qualify; Completed 01/12/21  Vision Screening: Recommended annual ophthalmology exams for early detection of glaucoma and other disorders of the eye. Is the patient  up to date with their annual eye exam?  Yes  Who is the provider or what is the name of the office in which the patient attends annual eye exams? [facility] [place] If pt is not established with a provider, would they like to be referred to a provider to establish care? No .   Dental Screening: Recommended annual dental exams for proper oral hygiene  Community Resource Referral / Chronic Care Management: CRR required this visit?  No   CCM required this visit?  No      Plan:     I have personally reviewed and noted the following in the patient's chart:   Medical and social history Use of alcohol, tobacco or illicit drugs  Current medications and supplements including opioid prescriptions. Patient is not currently taking opioid prescriptions. Functional ability and status Nutritional status Physical activity Advanced directives List of other physicians Hospitalizations, surgeries, and ER visits in previous 12 months Vitals Screenings to include cognitive, depression, and falls Referrals and appointments  In addition, I have  reviewed and discussed with patient certain preventive protocols, quality metrics, and best practice recommendations. A written personalized care plan for preventive services as well as general preventive health recommendations were provided to patient.     Hal Hope, LPN   1/61/0960   Nurse Notes: none

## 2022-07-23 ENCOUNTER — Other Ambulatory Visit: Payer: Self-pay | Admitting: Internal Medicine

## 2022-07-23 DIAGNOSIS — F419 Anxiety disorder, unspecified: Secondary | ICD-10-CM

## 2022-07-23 NOTE — Telephone Encounter (Signed)
Requested Prescriptions  Pending Prescriptions Disp Refills   PARoxetine (PAXIL) 40 MG tablet [Pharmacy Med Name: PAROXETINE HCL 40 MG TAB] 90 tablet 1    Sig: TAKE 1 TABLET BY MOUTH DAILY     Psychiatry:  Antidepressants - SSRI Passed - 07/23/2022  4:42 PM      Passed - Valid encounter within last 6 months    Recent Outpatient Visits           4 weeks ago Aortic atherosclerosis [facility])   [place] [facility] [place], Salvadore Oxford, NP   6 months ago Primary hypertension   [place] [facility] [place], Salvadore Oxford, NP   6 months ago Encounter for general adult medical examination with abnormal findings   [place] [facility] [place], Salvadore Oxford, NP   8 months ago Anxiety   [place] [facility] [place], Jackelyn Poling, RPH-CPP   1 year ago Aortic atherosclerosis [facility])   [place] [facility] [place], Salvadore Oxford, NP       Future Appointments             In 5 months Ba[place], Salvadore Oxford, NP [place] [facility], [facility]

## 2022-07-27 ENCOUNTER — Ambulatory Visit (INDEPENDENT_AMBULATORY_CARE_PROVIDER_SITE_OTHER): Payer: Medicare HMO

## 2022-07-27 ENCOUNTER — Telehealth: Payer: Medicare HMO

## 2022-07-27 ENCOUNTER — Telehealth: Payer: Self-pay

## 2022-07-27 DIAGNOSIS — I10 Essential (primary) hypertension: Secondary | ICD-10-CM

## 2022-07-27 DIAGNOSIS — F419 Anxiety disorder, unspecified: Secondary | ICD-10-CM

## 2022-07-27 DIAGNOSIS — F319 Bipolar disorder, unspecified: Secondary | ICD-10-CM

## 2022-07-27 DIAGNOSIS — K219 Gastro-esophageal reflux disease without esophagitis: Secondary | ICD-10-CM

## 2022-07-27 DIAGNOSIS — F3289 Other specified depressive episodes: Secondary | ICD-10-CM

## 2022-07-27 DIAGNOSIS — F411 Generalized anxiety disorder: Secondary | ICD-10-CM

## 2022-07-27 NOTE — Telephone Encounter (Signed)
   CCM RN Visit Note   07-27-2022 Name: [name] MRN: [id number]      DOB: 1950/12/18  Subjective: Katrina Sutton is a 72 y.o. year old female who is a primary care patient of @PCP . The patient was referred to the Chronic Care Management team for assistance with care management needs subsequent to provider initiation of CCM services and plan of care.      The patient called the RNCM back. See new encounter.   Plan:The care management team will reach out to the patient again over the next 30-60 days.  Alto Denver RN, MSN, CCM RN Care Manager  Chronic Care Management Direct Number: [phone]

## 2022-07-27 NOTE — Chronic Care Management (AMB) (Signed)
Chronic Care Management   CCM RN Visit Note  07/27/2022 Name: Katrina Sutton MRN: [id number] DOB: [birth year]  Subjective: Katrina Sutton is a 72 y.o. year old female who is a primary care patient of Lorre Munroe, NP. The patient was referred to the Chronic Care Management team for assistance with care management needs subsequent to provider initiation of CCM services and plan of care.    Today's Visit:  Engaged with patient by telephone for follow up visit.        Goals Addressed             This Visit's Progress    CCM Expected Outcome:  Monitor, Self-Manage and Reduce Symptoms of: GERD       Current Barriers:  Knowledge Deficits related to how to control triggers that cause exacerbation of GERD Care Coordination needs related to resources and ongoing education needs  in a patient with GERD Chronic Disease Management support and education needs related to Effective management of GERD Lacks caregiver support.   Planned Interventions: Evaluation of current treatment plan related to GERD and patient's adherence to plan as established by provider. The patient states her GERD and GI sx and sx are stable. Is still having some concerns with constipation. Discussed the stool softener use and miralax. Education on making sure she monitors for acute changes and to call the  provider if needed.  Advised patient to call the office for changes in her sx and sx of GERD Provided education to patient re: foods to avoid that will cause exacerbation, monitoring for changes, keeping stress levels down, and notifying the provider for changes in her condition. Review of dietary habits. The patient is eating fruits and vegetables. Discussed hydration and monitoring for acute changes in dietary habits that may cause constipation. The patient saw provider recently and discussed her concerns Reviewed medications with patient and discussed compliance, the patient states compliance with medications. Has had an  increase in her Protonix and this is working well for her at this time.  Reviewed scheduled/upcoming provider appointments including 01-13-2023 at 3 pm Discussed plans with patient for ongoing care management follow up and provided patient with direct contact information for care management team Advised patient to discuss changes in her GERD, questions or concerns with provider Screening for signs and symptoms of depression related to chronic disease state  Assessed social determinant of health barriers  Symptom Management: Take medications as prescribed   Attend all scheduled provider appointments Call provider office for new concerns or questions  call the Suicide and Crisis Lifeline: 988 call the [place] National Suicide Prevention Lifeline: 718-783-5679 or TTY: 774-807-4681 TTY 912-876-2670) to talk to a trained counselor call 1-800-273-TALK (toll free, 24 hour hotline) if experiencing a Mental Health or Behavioral Health Crisis   Follow Up Plan: Telephone follow up appointment with care management team member scheduled for: 09-21-2022  at 1 pm       CCM Expected Outcome:  Monitor, Self-Manage and Reduce Symptoms [id number] disorder and anxiety       Current Barriers:  Knowledge Deficits related to not understanding why her urine test did not show positive for her klonopin as she takes it as directed.  Care Coordination needs related to ongoing education and support needed  in a patient with bipolar disorder and anxiety Chronic Disease Management support and education needs related to effective management of bipolar disorder and anxiety Lacks caregiver support.   Planned Interventions: Evaluation of current treatment plan related to bipolar  disorder and anxiety and patient's adherence to plan as established by provider. The patient is having a good day today. She denies any acute changes in her mental health. Reflected on mothers day and being thankful for her husband and her son.  Reflective listening and support. Feels she is in a good place right now.   Advised patient to call the office for changes in her mood, anxiety, depression or mental health needs  Provided education to patient re: The patient states she is happy that she got her medication refills. Sometimes she gets confused about test results and testing but states she is happy for the support of the [facility] and her providers.   Reviewed medications with patient and discussed compliance. The patient endorses compliance with medications  Reviewed scheduled/upcoming provider appointments including 01-13-2023 at 3pm Discussed plans with patient for ongoing care management follow up and provided patient with direct contact information for care management team Advised patient to discuss changes in her mental health needs with provider Screening for signs and symptoms of depression related to chronic disease state  Assessed social determinant of health barriers  Symptom Management: Take medications as prescribed   Attend all scheduled provider appointments Call provider office for new concerns or questions  Work with the social worker to address care coordination needs and will continue to work with the clinical team to address health care and disease management related needs call the Suicide and Crisis Lifeline: 988 call the [place] National Suicide Prevention Lifeline: 878 510 7052 or TTY: 714-820-7295 TTY 438-839-0624) to talk to a trained counselor call 1-800-273-TALK (toll free, 24 hour hotline) if experiencing a Mental Health or Behavioral Health Crisis   Follow Up Plan: Telephone follow up appointment with care management team member scheduled for: 09-21-2022 at 1 pm       CCM Expected Outcome:  Monitor, Self-Manage, and Reduce Symptoms of Hypertension       Current Barriers:  Knowledge Deficits related to controlling blood pressures and monitoring for factors that stress her out and increase her blood  pressures Care Coordination needs related to patient dealing with some hypotension since starting medications and not knowing when to take medications or not in a patient with HTN Chronic Disease Management support and education needs related to effective management of HTN Lacks caregiver support.  BP Readings from Last 3 Encounters:  06/24/22 126/74  01/22/22 132/84  01/08/22 (!) 142/94     Planned Interventions: Evaluation of current treatment plan related to hypertension self management and patient's adherence to plan as established by provider. The patient denies any acute changes in her HTN or heart health. She does have an elevation in her blood pressures when she gets stressed out or overwhelmed. The patient saw the pcp recently and blood pressures are more stable. She denies any acute findings  Provided education to patient re: stroke prevention, s/s of heart attack and stroke; Reviewed prescribed diet heart healthy diet. The patient is compliant with heart healthy diet. Eating well and denies any acute changes in her dietary habits. She states she is monitoring sweets and salts as she found out she is pre-DM. Education and support given.  Reviewed medications with patient and discussed importance of compliance. The patient is compliant with medications. Is compliant with medications. States she is now taking medications for her cholesterol. Denies any new concerns with her medications;  Discussed plans with patient for ongoing care management follow up and provided patient with direct contact information for care management team; Advised patient,  providing education and rationale, to monitor blood pressure daily and record, calling PCP for findings outside established parameters;  Reviewed scheduled/upcoming provider appointments including: 01-13-2023 at 3 pm. Knows to call for changes or needs  Advised patient to discuss changes in her blood pressures, parameters of when to hold her  medications and other questions or concerns about heart healthy with provider; Provided education on prescribed diet heart healthy;  Discussed complications of poorly controlled blood pressure such as heart disease, stroke, circulatory complications, vision complications, kidney impairment, sexual dysfunction;  Screening for signs and symptoms of depression related to chronic disease state;  Assessed social determinant of health barriers;  The patient has been having some hypotension since starting blood pressure medications. She has stopped on her own with taking the medications as she felt her blood pressure was too low. She states her blood pressure only goes up if she is stressed.   Symptom Management: Take medications as prescribed   Attend all scheduled provider appointments Call pharmacy for medication refills 3-7 days in advance of running out of medications Call provider office for new concerns or questions  call the Suicide and Crisis Lifeline: 988 call the [place] National Suicide Prevention Lifeline: (908)718-1549 or TTY: (810)450-2696 TTY 2601163119) to talk to a trained counselor call 1-800-273-TALK (toll free, 24 hour hotline) if experiencing a Mental Health or Behavioral Health Crisis  check blood pressure 3 times per week learn about high blood pressure keep a blood pressure log take blood pressure log to all doctor appointments call doctor for signs and symptoms of high blood pressure develop an action plan for high blood pressure keep all doctor appointments take medications for blood pressure exactly as prescribed report new symptoms to your doctor  Follow Up Plan: Telephone follow up appointment with care management team member scheduled for: 09-21-2022 at 1 pm          Plan:Telephone follow up appointment with care management team member scheduled for:  09-21-2022 at 1 pm  Alto Denver RN, MSN, CCM RN Care Manager  Chronic Care Management Direct Number: [phone]

## 2022-07-27 NOTE — Patient Instructions (Signed)
Please call the care guide team at [phone] if you need to cancel or reschedule your appointment.   If you are experiencing a Mental Health or Behavioral Health Crisis or need someone to talk to, please call the Suicide and Crisis Lifeline: 988 call the [place] National Suicide Prevention Lifeline: 203-630-8065 or TTY: 623-808-8364 TTY 802-491-6341) to talk to a trained counselor call 1-800-273-TALK (toll free, 24 hour hotline)   Following is a copy of the CCM Program Consent:  CCM service includes personalized support from designated clinical staff supervised by the physician, including individualized plan of care and coordination with other care providers 24/7 contact phone numbers for assistance for urgent and routine care needs. Service will only be billed when office clinical staff spend 20 minutes or more in a month to coordinate care. Only one practitioner may furnish and bill the service in a calendar month. The patient may stop CCM services at amy time (effective at the end of the month) by phone call to the office staff. The patient will be responsible for cost sharing (co-pay) or up to 20% of the service fee (after annual deductible is met)  Following is a copy of your full provider care plan:   Goals Addressed             This Visit's Progress    CCM Expected Outcome:  Monitor, Self-Manage and Reduce Symptoms of: GERD       Current Barriers:  Knowledge Deficits related to how to control triggers that cause exacerbation of GERD Care Coordination needs related to resources and ongoing education needs  in a patient with GERD Chronic Disease Management support and education needs related to Effective management of GERD Lacks caregiver support.   Planned Interventions: Evaluation of current treatment plan related to GERD and patient's adherence to plan as established by provider. The patient states her GERD and GI sx and sx are stable. Is still having some concerns with  constipation. Discussed the stool softener use and miralax. Education on making sure she monitors for acute changes and to call the  provider if needed.  Advised patient to call the office for changes in her sx and sx of GERD Provided education to patient re: foods to avoid that will cause exacerbation, monitoring for changes, keeping stress levels down, and notifying the provider for changes in her condition. Review of dietary habits. The patient is eating fruits and vegetables. Discussed hydration and monitoring for acute changes in dietary habits that may cause constipation. The patient saw provider recently and discussed her concerns Reviewed medications with patient and discussed compliance, the patient states compliance with medications. Has had an increase in her Protonix and this is working well for her at this time.  Reviewed scheduled/upcoming provider appointments including 01-13-2023 at 3 pm Discussed plans with patient for ongoing care management follow up and provided patient with direct contact information for care management team Advised patient to discuss changes in her GERD, questions or concerns with provider Screening for signs and symptoms of depression related to chronic disease state  Assessed social determinant of health barriers  Symptom Management: Take medications as prescribed   Attend all scheduled provider appointments Call provider office for new concerns or questions  call the Suicide and Crisis Lifeline: 988 call the [place] National Suicide Prevention Lifeline: 708-693-8715 or TTY: 854-381-7103 TTY 818-616-4822) to talk to a trained counselor call 1-800-273-TALK (toll free, 24 hour hotline) if experiencing a Mental Health or Behavioral Health Crisis   Follow Up Plan:  Telephone follow up appointment with care management team member scheduled for: 09-21-2022  at 1 pm       CCM Expected Outcome:  Monitor, Self-Manage and Reduce Symptoms [id number] disorder and  anxiety       Current Barriers:  Knowledge Deficits related to not understanding why her urine test did not show positive for her klonopin as she takes it as directed.  Care Coordination needs related to ongoing education and support needed  in a patient with bipolar disorder and anxiety Chronic Disease Management support and education needs related to effective management of bipolar disorder and anxiety Lacks caregiver support.   Planned Interventions: Evaluation of current treatment plan related to bipolar disorder and anxiety and patient's adherence to plan as established by provider. The patient is having a good day today. She denies any acute changes in her mental health. Reflected on mothers day and being thankful for her husband and her son. Reflective listening and support. Feels she is in a good place right now.   Advised patient to call the office for changes in her mood, anxiety, depression or mental health needs  Provided education to patient re: The patient states she is happy that she got her medication refills. Sometimes she gets confused about test results and testing but states she is happy for the support of the [facility] and her providers.   Reviewed medications with patient and discussed compliance. The patient endorses compliance with medications  Reviewed scheduled/upcoming provider appointments including 01-13-2023 at 3pm Discussed plans with patient for ongoing care management follow up and provided patient with direct contact information for care management team Advised patient to discuss changes in her mental health needs with provider Screening for signs and symptoms of depression related to chronic disease state  Assessed social determinant of health barriers  Symptom Management: Take medications as prescribed   Attend all scheduled provider appointments Call provider office for new concerns or questions  Work with the social worker to address care coordination needs and  will continue to work with the clinical team to address health care and disease management related needs call the Suicide and Crisis Lifeline: 988 call the [place] National Suicide Prevention Lifeline: 936-344-9258 or TTY: 289-120-5439 TTY (928)055-5307) to talk to a trained counselor call 1-800-273-TALK (toll free, 24 hour hotline) if experiencing a Mental Health or Behavioral Health Crisis   Follow Up Plan: Telephone follow up appointment with care management team member scheduled for: 09-21-2022 at 1 pm       CCM Expected Outcome:  Monitor, Self-Manage, and Reduce Symptoms of Hypertension       Current Barriers:  Knowledge Deficits related to controlling blood pressures and monitoring for factors that stress her out and increase her blood pressures Care Coordination needs related to patient dealing with some hypotension since starting medications and not knowing when to take medications or not in a patient with HTN Chronic Disease Management support and education needs related to effective management of HTN Lacks caregiver support.  BP Readings from Last 3 Encounters:  06/24/22 126/74  01/22/22 132/84  01/08/22 (!) 142/94     Planned Interventions: Evaluation of current treatment plan related to hypertension self management and patient's adherence to plan as established by provider. The patient denies any acute changes in her HTN or heart health. She does have an elevation in her blood pressures when she gets stressed out or overwhelmed. The patient saw the pcp recently and blood pressures are more stable. She denies any acute findings  Provided education to patient re: stroke prevention, s/s of heart attack and stroke; Reviewed prescribed diet heart healthy diet. The patient is compliant with heart healthy diet. Eating well and denies any acute changes in her dietary habits. She states she is monitoring sweets and salts as she found out she is pre-DM. Education and support given.  Reviewed  medications with patient and discussed importance of compliance. The patient is compliant with medications. Is compliant with medications. States she is now taking medications for her cholesterol. Denies any new concerns with her medications;  Discussed plans with patient for ongoing care management follow up and provided patient with direct contact information for care management team; Advised patient, providing education and rationale, to monitor blood pressure daily and record, calling PCP for findings outside established parameters;  Reviewed scheduled/upcoming provider appointments including: 01-13-2023 at 3 pm. Knows to call for changes or needs  Advised patient to discuss changes in her blood pressures, parameters of when to hold her medications and other questions or concerns about heart healthy with provider; Provided education on prescribed diet heart healthy;  Discussed complications of poorly controlled blood pressure such as heart disease, stroke, circulatory complications, vision complications, kidney impairment, sexual dysfunction;  Screening for signs and symptoms of depression related to chronic disease state;  Assessed social determinant of health barriers;  The patient has been having some hypotension since starting blood pressure medications. She has stopped on her own with taking the medications as she felt her blood pressure was too low. She states her blood pressure only goes up if she is stressed.   Symptom Management: Take medications as prescribed   Attend all scheduled provider appointments Call pharmacy for medication refills 3-7 days in advance of running out of medications Call provider office for new concerns or questions  call the Suicide and Crisis Lifeline: 988 call the [place] National Suicide Prevention Lifeline: 520-553-2048 or TTY: 939-571-7985 TTY (912)331-6661) to talk to a trained counselor call 1-800-273-TALK (toll free, 24 hour hotline) if experiencing a  Mental Health or Behavioral Health Crisis  check blood pressure 3 times per week learn about high blood pressure keep a blood pressure log take blood pressure log to all doctor appointments call doctor for signs and symptoms of high blood pressure develop an action plan for high blood pressure keep all doctor appointments take medications for blood pressure exactly as prescribed report new symptoms to your doctor  Follow Up Plan: Telephone follow up appointment with care management team member scheduled for: 09-21-2022 at 1 pm          The patient verbalized understanding of instructions, educational materials, and care plan provided today and DECLINED offer to receive copy of patient instructions, educational materials, and care plan.  Telephone follow up appointment with care management team member scheduled for: 09-21-2022 at 1 pm

## 2022-07-30 ENCOUNTER — Ambulatory Visit: Payer: Self-pay | Admitting: *Deleted

## 2022-07-30 NOTE — Telephone Encounter (Signed)
Reason for Disposition  [1] Caller has URGENT medicine question about med that PCP or specialist prescribed AND [2] triager unable to answer question  Answer Assessment - Initial Assessment Questions 1. NAME of MEDICINE: "What medicine(s) are you calling about?"     Simvastatin 10 mg 2. QUESTION: "What is your question?" (e.g., double dose of medicine, side effect)     Blurred vision, cramps in legs and weakness in my legs since I started taking this.   My foot cramps sometimes too on the accelerator when I'm driving sometimes.   I've been on this a month now and am noticing these things happening.   3. PRESCRIBER: "Who prescribed the medicine?" Reason: if prescribed by specialist, call should be referred to that group.     Nicki Reaper, NP 4. SYMPTOMS: "Do you have any symptoms?" If Yes, ask: "What symptoms are you having?"  "How bad are the symptoms (e.g., mild, moderate, severe)     See above 5. PREGNANCY:  "Is there any chance that you are pregnant?" "When was your last menstrual period?"     N/A due to age  Protocols used: Medication Question Call-A-AH  Chief Complaint: Side effects from the Simvastatin 10 mg Symptoms: Blurred vision, cramps and weakness in her legs with cramping in her foot when driving Frequency: Been on the simvastatin for a month now and these symptoms have developed over the last month since taking the medication. Pertinent Negatives: Patient denies having tried any other statin drugs in the past. Disposition: [] ED /[] Urgent Care (no appt availability in office) / [] Appointment(In office/virtual)/ []  [facility]/ [] Home Care/ [] Refused Recommended Disposition /[]  Mobile Bus/ [x]  Follow-up with PCP Additional Notes: Message sent to Nicki Reaper, NP.    Pt. Agreeable to someone calling her back with Regina's recommendation.   She is not going to take any more simvastatin until she hears from [place].

## 2022-07-30 NOTE — Telephone Encounter (Signed)
I want her to hold simvastatin for 2 weeks and lets see if symptoms resolve.  If they do, I would like her to restart simvastatin taken only 3 times weekly and let me know how she does.

## 2022-07-30 NOTE — Telephone Encounter (Signed)
Message from Cheri Guppy sent at 07/30/2022 10:49 AM EDT  Summary: Medication Side Effects   Pt is calling in because she says the medication simvastatin (ZOCOR) 10 MG tablet [[id number]] is causing blurred vision, cramps in legs, and weakness in her legs as well. Pt says she didn't get these symptoms until she started taking the medication a month ago.          Call History   Type Contact Phone/Fax User  07/30/2022 10:48 AM EDT Phone (Incoming) [place], Katrina T (Self) [phone] Rexene Edison) Sutton, Katrina Sutton

## 2022-07-30 NOTE — Telephone Encounter (Signed)
Summary: Medication Side Effects   Pt is calling in because she says the medication simvastatin (ZOCOR) 10 MG tablet [[id number]] is causing blurred vision, cramps in legs, and weakness in her legs as well. Pt says she didn't get these symptoms until she started taking the medication a month ago    Called pt - Left message on machine to return call.

## 2022-07-30 NOTE — Telephone Encounter (Signed)
Patient returned call, given information as below as per Rene Kocher, patient verbalized understanding. Advised to start taking simvastatin on 08/16/22, she says she will take on each M-W-F.

## 2022-07-30 NOTE — Telephone Encounter (Signed)
Attempted to return her call.   Left a voicemail to call back to discuss symptoms with a nurse. 

## 2022-07-30 NOTE — Telephone Encounter (Signed)
LMTCB 07/30/2022.  PEC please advise pt when she calls back.   Thanks,   -Vernona Rieger

## 2022-08-10 ENCOUNTER — Other Ambulatory Visit: Payer: Self-pay | Admitting: Internal Medicine

## 2022-08-11 NOTE — Telephone Encounter (Signed)
Requested medication (s) are due for refill today -yes  Requested medication (s) are on the active medication list -yes  Future visit scheduled -yes  Last refill: 07/08/22 #90  Notes to clinic: non delegated Rx  Requested Prescriptions  Pending Prescriptions Disp Refills   clonazePAM (KLONOPIN) 0.5 MG tablet [Pharmacy Med Name: CLONAZEPAM 0.5 MG TAB] 90 tablet     Sig: TAKE 1 TABLET BY MOUTH 3 TIMES A DAY AS NEEDED     Not Delegated - Psychiatry: Anxiolytics/Hypnotics 2 Failed - 08/10/2022  9:44 AM      Failed - This refill cannot be delegated      Failed - Urine Drug Screen completed in last 360 days      Passed - Patient is not pregnant      Passed - Valid encounter within last 6 months    Recent Outpatient Visits           1 month ago Aortic atherosclerosis (HCC)   [place] [facility] [place], Salvadore Oxford, NP   6 months ago Primary hypertension   [place] [facility] [place], Salvadore Oxford, NP   7 months ago Encounter for general adult medical examination with abnormal findings   [place] [facility] [place], Salvadore Oxford, NP   8 months ago Anxiety   [place] [facility] [place], Gentry Fitz A, RPH-CPP   1 year ago Aortic atherosclerosis [facility])   [place] [facility] [place], Salvadore Oxford, NP       Future Appointments             In 5 months Baity, Salvadore Oxford, NP [place] [facility], [facility]               Requested Prescriptions  Pending Prescriptions Disp Refills   clonazePAM (KLONOPIN) 0.5 MG tablet [Pharmacy Med Name: CLONAZEPAM 0.5 MG TAB] 90 tablet     Sig: TAKE 1 TABLET BY MOUTH 3 TIMES A DAY AS NEEDED     Not Delegated - Psychiatry: Anxiolytics/Hypnotics 2 Failed - 08/10/2022  9:44 AM      Failed - This refill cannot be delegated      Failed - Urine Drug Screen completed in last 360 days      Passed - Patient is not pregnant      Passed - Valid encounter within  last 6 months    Recent Outpatient Visits           1 month ago Aortic atherosclerosis [facility])   [place] [facility] [place], Salvadore Oxford, NP   6 months ago Primary hypertension   [place] [facility] [place], Salvadore Oxford, NP   7 months ago Encounter for general adult medical examination with abnormal findings   [place] [facility] [place], Salvadore Oxford, NP   8 months ago Anxiety   [place] [facility] [place], Gentry Fitz A, RPH-CPP   1 year ago Aortic atherosclerosis [facility])   [place] [facility] [place], Salvadore Oxford, NP       Future Appointments             In 5 months Baity, Salvadore Oxford, NP [place] [facility], [facility]

## 2022-08-13 DIAGNOSIS — F3289 Other specified depressive episodes: Secondary | ICD-10-CM

## 2022-08-13 DIAGNOSIS — I10 Essential (primary) hypertension: Secondary | ICD-10-CM

## 2022-09-06 ENCOUNTER — Other Ambulatory Visit: Payer: Self-pay | Admitting: Internal Medicine

## 2022-09-07 NOTE — Telephone Encounter (Signed)
Requested medication (s) are due for refill today: yes  Requested medication (s) are on the active medication list: yes  Last refill:  08/11/22  Future visit scheduled: yes  Notes to clinic:  Unable to refill per protocol, cannot delegate.      Requested Prescriptions  Pending Prescriptions Disp Refills   clonazePAM (KLONOPIN) 0.5 MG tablet [Pharmacy Med Name: CLONAZEPAM 0.5 MG TAB] 90 tablet     Sig: TAKE 1 TABLET BY MOUTH 3 TIMES A DAY AS NEEDED     Not Delegated - Psychiatry: Anxiolytics/Hypnotics 2 Failed - 09/06/2022  9:32 AM      Failed - This refill cannot be delegated      Failed - Urine Drug Screen completed in last 360 days      Passed - Patient is not pregnant      Passed - Valid encounter within last 6 months    Recent Outpatient Visits           2 months ago Aortic atherosclerosis [facility])   [place] [facility] [place], Salvadore Oxford, NP   7 months ago Primary hypertension   [place] [facility], Salvadore Oxford, NP   8 months ago Encounter for general adult medical examination with abnormal findings   [facility] [place], Salvadore Oxford, NP   9 months ago Anxiety   [place] [facility] [place], Jackelyn Poling, RPH-CPP   1 year ago Aortic atherosclerosis [facility])   [place] [facility] [place], Salvadore Oxford, NP       Future Appointments             In 4 months Baity, Salvadore Oxford, NP [place] [facility], [facility]

## 2022-09-21 ENCOUNTER — Ambulatory Visit (INDEPENDENT_AMBULATORY_CARE_PROVIDER_SITE_OTHER): Payer: Medicare HMO

## 2022-09-21 ENCOUNTER — Telehealth: Payer: Medicare HMO

## 2022-09-21 ENCOUNTER — Telehealth: Payer: Self-pay

## 2022-09-21 DIAGNOSIS — F3289 Other specified depressive episodes: Secondary | ICD-10-CM

## 2022-09-21 DIAGNOSIS — K219 Gastro-esophageal reflux disease without esophagitis: Secondary | ICD-10-CM

## 2022-09-21 DIAGNOSIS — I10 Essential (primary) hypertension: Secondary | ICD-10-CM

## 2022-09-21 DIAGNOSIS — F319 Bipolar disorder, unspecified: Secondary | ICD-10-CM

## 2022-09-21 DIAGNOSIS — F419 Anxiety disorder, unspecified: Secondary | ICD-10-CM

## 2022-09-21 DIAGNOSIS — F411 Generalized anxiety disorder: Secondary | ICD-10-CM

## 2022-09-21 NOTE — Telephone Encounter (Signed)
   CCM RN Visit Note   09-21-2022 Name: Katrina Sutton MRN: [id number]      DOB: Oct 23, 1950  Subjective: Katrina Sutton is a 72 y.o. year old female who is a primary care patient of Nicki Reaper, NP. The patient was referred to the Chronic Care Management team for assistance with care management needs subsequent to provider initiation of CCM services and plan of care.      An unsuccessful telephone outreach was attempted today to contact the patient about Chronic Care Management needs.    Plan:A HIPAA compliant phone message was left for the patient providing contact information and requesting a return call.  Alto Denver RN, MSN, CCM RN Care Manager  Chronic Care Management Direct Number: [phone]

## 2022-09-21 NOTE — Chronic Care Management (AMB) (Signed)
Chronic Care Management   CCM RN Visit Note  09/21/2022 Name: Katrina Sutton MRN: [id number] DOB: May 14, 1950  Subjective: Katrina Sutton is a 72 y.o. year old female who is a primary care patient of Katrina Munroe, NP. The patient was referred to the Chronic Care Management team for assistance with care management needs subsequent to provider initiation of CCM services and plan of care.    Today's Visit:  Engaged with patient by telephone for follow up visit.        Goals Addressed             This Visit's Progress    CCM Expected Outcome:  Monitor, Self-Manage and Reduce Symptoms of: GERD       Current Barriers:  Knowledge Deficits related to how to control triggers that cause exacerbation of GERD Care Coordination needs related to resources and ongoing education needs  in a patient with GERD Chronic Disease Management support and education needs related to Effective management of GERD Lacks caregiver support.   Planned Interventions: Evaluation of current treatment plan related to GERD and patient's adherence to plan as established by provider. The patient states her GERD and GI sx and sx are stable. She states her constipation is better. She is sleeping on more than one pillow now and this is helping her breath better and get better rest. Advised patient to call the office for changes in her sx and sx of GERD Provided education to patient re: foods to avoid that will cause exacerbation, monitoring for changes, keeping stress levels down, and notifying the provider for changes in her condition. Review of dietary habits. The patient is eating fruits and vegetables. Discussed hydration and monitoring for acute changes in dietary habits that may cause constipation. The patient saw provider recently and discussed her concerns. The patient is staying hydrated and denies any acute changes in her dietary habits Reviewed medications with patient and discussed compliance, the patient states  compliance with medications. Has had an increase in her Protonix and this is working well for her at this time.  Reviewed scheduled/upcoming provider appointments including 01-13-2023 at 3 pm Discussed plans with patient for ongoing care management follow up and provided patient with direct contact information for care management team Advised patient to discuss changes in her GERD, questions or concerns with provider Screening for signs and symptoms of depression related to chronic disease state  Assessed social determinant of health barriers  Symptom Management: Take medications as prescribed   Attend all scheduled provider appointments Call provider office for new concerns or questions  call the Suicide and Crisis Lifeline: 988 call the [place] National Suicide Prevention Lifeline: (214)240-9580 or TTY: 226-158-6805 TTY 561-875-9258) to talk to a trained counselor call 1-800-273-TALK (toll free, 24 hour hotline) if experiencing a Mental Health or Behavioral Health Crisis   Follow Up Plan: Telephone follow up appointment with care management team member scheduled for: 11-23-2022  at 1 pm       CCM Expected Outcome:  Monitor, Self-Manage and Reduce Symptoms [id number] disorder and anxiety       Current Barriers:  Knowledge Deficits related to not understanding why her urine test did not show positive for her klonopin as she takes it as directed.  Care Coordination needs related to ongoing education and support needed  in a patient with bipolar disorder and anxiety Chronic Disease Management support and education needs related to effective management of bipolar disorder and anxiety Lacks caregiver support.   Planned Interventions:  Evaluation of current treatment plan related to bipolar disorder and anxiety and patient's adherence to plan as established by provider. The patient is having a good day today. She denies any acute changes in her mental health. She missed the [facility] call earlier  because she had to go get her tire fixed. It had a big bolt in it. She states that she is glad she could have it plugged. She is doing well and staying out of the hot. Denies any acute changes in her mental health and well being. Knows to call for changes or new needs.  Advised patient to call the office for changes in her mood, anxiety, depression or mental health needs  Provided education to patient re: The patient states she is happy that she got her medication refills. Sometimes she gets confused about test results and testing but states she is happy for the support of the [facility] and her providers.   Reviewed medications with patient and discussed compliance. The patient endorses compliance with medications  Reviewed scheduled/upcoming provider appointments including 01-13-2023 at 3pm Discussed plans with patient for ongoing care management follow up and provided patient with direct contact information for care management team Advised patient to discuss changes in her mental health needs with provider Screening for signs and symptoms of depression related to chronic disease state  Assessed social determinant of health barriers  Symptom Management: Take medications as prescribed   Attend all scheduled provider appointments Call provider office for new concerns or questions  Work with the social worker to address care coordination needs and will continue to work with the clinical team to address health care and disease management related needs call the Suicide and Crisis Lifeline: 988 call the [place] National Suicide Prevention Lifeline: (916)203-8181 or TTY: 779-729-2168 TTY 780-496-3790) to talk to a trained counselor call 1-800-273-TALK (toll free, 24 hour hotline) if experiencing a Mental Health or Behavioral Health Crisis   Follow Up Plan: Telephone follow up appointment with care management team member scheduled for: 11-23-2022 at 1 pm       CCM Expected Outcome:  Monitor, Self-Manage, and  Reduce Symptoms of Hypertension       Current Barriers:  Knowledge Deficits related to controlling blood pressures and monitoring for factors that stress her out and increase her blood pressures Care Coordination needs related to patient dealing with some hypotension since starting medications and not knowing when to take medications or not in a patient with HTN Chronic Disease Management support and education needs related to effective management of HTN Lacks caregiver support.  BP Readings from Last 3 Encounters:  06/24/22 126/74  01/22/22 132/84  01/08/22 (!) 142/94     Planned Interventions: Evaluation of current treatment plan related to hypertension self management and patient's adherence to plan as established by provider. The patient denies any acute changes in her HTN or heart health. She does have an elevation in her blood pressures when she gets stressed out or overwhelmed. The patient with more stable blood pressures at this time. The patient denies any changes in her health and well being.  Provided education to patient re: stroke prevention, s/s of heart attack and stroke; Reviewed prescribed diet heart healthy diet. The patient is compliant with heart healthy diet. Eating well and denies any acute changes in her dietary habits. She states she is monitoring sweets and salts as she found out she is pre-DM. Education and support given.  Reviewed medications with patient and discussed importance of compliance. The patient is  compliant with medications. Is compliant with medications. States she is now taking medications for her cholesterol. Denies any new concerns with her medications;  Discussed plans with patient for ongoing care management follow up and provided patient with direct contact information for care management team; Advised patient, providing education and rationale, to monitor blood pressure daily and record, calling PCP for findings outside established parameters;   Reviewed scheduled/upcoming provider appointments including: 01-13-2023 at 3 pm. Knows to call for changes or needs  Advised patient to discuss changes in her blood pressures, parameters of when to hold her medications and other questions or concerns about heart healthy with provider; Provided education on prescribed diet heart healthy;  Discussed complications of poorly controlled blood pressure such as heart disease, stroke, circulatory complications, vision complications, kidney impairment, sexual dysfunction;  Screening for signs and symptoms of depression related to chronic disease state;  Assessed social determinant of health barriers;  The patient has been having some hypotension since starting blood pressure medications. She has stopped on her own with taking the medications as she felt her blood pressure was too low. She states her blood pressure only goes up if she is stressed.   Symptom Management: Take medications as prescribed   Attend all scheduled provider appointments Call pharmacy for medication refills 3-7 days in advance of running out of medications Call provider office for new concerns or questions  call the Suicide and Crisis Lifeline: 988 call the [place] National Suicide Prevention Lifeline: 754-820-0350 or TTY: 636-640-3302 TTY (510)426-4324) to talk to a trained counselor call 1-800-273-TALK (toll free, 24 hour hotline) if experiencing a Mental Health or Behavioral Health Crisis  check blood pressure 3 times per week learn about high blood pressure keep a blood pressure log take blood pressure log to all doctor appointments call doctor for signs and symptoms of high blood pressure develop an action plan for high blood pressure keep all doctor appointments take medications for blood pressure exactly as prescribed report new symptoms to your doctor  Follow Up Plan: Telephone follow up appointment with care management team member scheduled for: 11-23-2022 at 1 pm           Plan:Telephone follow up appointment with care management team member scheduled for:  11-23-2022 at 1 pm  Alto Denver RN, MSN, CCM RN Care Manager  Chronic Care Management Direct Number: [phone]

## 2022-09-21 NOTE — Patient Instructions (Signed)
Please call the care guide team at [phone] if you need to cancel or reschedule your appointment.   If you are experiencing a Mental Health or Behavioral Health Crisis or need someone to talk to, please call the Suicide and Crisis Lifeline: 988 call the [place] National Suicide Prevention Lifeline: (386)446-9491 or TTY: 479-679-8938 TTY 870 720 6378) to talk to a trained counselor call 1-800-273-TALK (toll free, 24 hour hotline)   Following is a copy of the CCM Program Consent:  CCM service includes personalized support from designated clinical staff supervised by the physician, including individualized plan of care and coordination with other care providers 24/7 contact phone numbers for assistance for urgent and routine care needs. Service will only be billed when office clinical staff spend 20 minutes or more in a month to coordinate care. Only one practitioner may furnish and bill the service in a calendar month. The patient may stop CCM services at amy time (effective at the end of the month) by phone call to the office staff. The patient will be responsible for cost sharing (co-pay) or up to 20% of the service fee (after annual deductible is met)  Following is a copy of your full provider care plan:   Goals Addressed             This Visit's Progress    CCM Expected Outcome:  Monitor, Self-Manage and Reduce Symptoms of: GERD       Current Barriers:  Knowledge Deficits related to how to control triggers that cause exacerbation of GERD Care Coordination needs related to resources and ongoing education needs  in a patient with GERD Chronic Disease Management support and education needs related to Effective management of GERD Lacks caregiver support.   Planned Interventions: Evaluation of current treatment plan related to GERD and patient's adherence to plan as established by provider. The patient states her GERD and GI sx and sx are stable. She states her constipation is better.  She is sleeping on more than one pillow now and this is helping her breath better and get better rest. Advised patient to call the office for changes in her sx and sx of GERD Provided education to patient re: foods to avoid that will cause exacerbation, monitoring for changes, keeping stress levels down, and notifying the provider for changes in her condition. Review of dietary habits. The patient is eating fruits and vegetables. Discussed hydration and monitoring for acute changes in dietary habits that may cause constipation. The patient saw provider recently and discussed her concerns. The patient is staying hydrated and denies any acute changes in her dietary habits Reviewed medications with patient and discussed compliance, the patient states compliance with medications. Has had an increase in her Protonix and this is working well for her at this time.  Reviewed scheduled/upcoming provider appointments including 01-13-2023 at 3 pm Discussed plans with patient for ongoing care management follow up and provided patient with direct contact information for care management team Advised patient to discuss changes in her GERD, questions or concerns with provider Screening for signs and symptoms of depression related to chronic disease state  Assessed social determinant of health barriers  Symptom Management: Take medications as prescribed   Attend all scheduled provider appointments Call provider office for new concerns or questions  call the Suicide and Crisis Lifeline: 988 call the [place] National Suicide Prevention Lifeline: 418 297 2111 or TTY: 531-623-4414 TTY (940) 811-0004) to talk to a trained counselor call 1-800-273-TALK (toll free, 24 hour hotline) if experiencing a Mental Health or  Behavioral Health Crisis   Follow Up Plan: Telephone follow up appointment with care management team member scheduled for: 11-23-2022  at 1 pm       CCM Expected Outcome:  Monitor, Self-Manage and Reduce  Symptoms JX:BJYNWGN disorder and anxiety       Current Barriers:  Knowledge Deficits related to not understanding why her urine test did not show positive for her klonopin as she takes it as directed.  Care Coordination needs related to ongoing education and support needed  in a patient with bipolar disorder and anxiety Chronic Disease Management support and education needs related to effective management of bipolar disorder and anxiety Lacks caregiver support.   Planned Interventions: Evaluation of current treatment plan related to bipolar disorder and anxiety and patient's adherence to plan as established by provider. The patient is having a good day today. She denies any acute changes in her mental health. She missed the [facility] call earlier because she had to go get her tire fixed. It had a big bolt in it. She states that she is glad she could have it plugged. She is doing well and staying out of the hot. Denies any acute changes in her mental health and well being. Knows to call for changes or new needs.  Advised patient to call the office for changes in her mood, anxiety, depression or mental health needs  Provided education to patient re: The patient states she is happy that she got her medication refills. Sometimes she gets confused about test results and testing but states she is happy for the support of the [facility] and her providers.   Reviewed medications with patient and discussed compliance. The patient endorses compliance with medications  Reviewed scheduled/upcoming provider appointments including 01-13-2023 at 3pm Discussed plans with patient for ongoing care management follow up and provided patient with direct contact information for care management team Advised patient to discuss changes in her mental health needs with provider Screening for signs and symptoms of depression related to chronic disease state  Assessed social determinant of health barriers  Symptom Management: Take  medications as prescribed   Attend all scheduled provider appointments Call provider office for new concerns or questions  Work with the social worker to address care coordination needs and will continue to work with the clinical team to address health care and disease management related needs call the Suicide and Crisis Lifeline: 988 call the [place] National Suicide Prevention Lifeline: 318-199-2132 or TTY: 807-372-2498 TTY (765)327-9352) to talk to a trained counselor call 1-800-273-TALK (toll free, 24 hour hotline) if experiencing a Mental Health or Behavioral Health Crisis   Follow Up Plan: Telephone follow up appointment with care management team member scheduled for: 11-23-2022 at 1 pm       CCM Expected Outcome:  Monitor, Self-Manage, and Reduce Symptoms of Hypertension       Current Barriers:  Knowledge Deficits related to controlling blood pressures and monitoring for factors that stress her out and increase her blood pressures Care Coordination needs related to patient dealing with some hypotension since starting medications and not knowing when to take medications or not in a patient with HTN Chronic Disease Management support and education needs related to effective management of HTN Lacks caregiver support.  BP Readings from Last 3 Encounters:  06/24/22 126/74  01/22/22 132/84  01/08/22 (!) 142/94     Planned Interventions: Evaluation of current treatment plan related to hypertension self management and patient's adherence to plan as established by provider. The patient  denies any acute changes in her HTN or heart health. She does have an elevation in her blood pressures when she gets stressed out or overwhelmed. The patient with more stable blood pressures at this time. The patient denies any changes in her health and well being.  Provided education to patient re: stroke prevention, s/s of heart attack and stroke; Reviewed prescribed diet heart healthy diet. The patient is  compliant with heart healthy diet. Eating well and denies any acute changes in her dietary habits. She states she is monitoring sweets and salts as she found out she is pre-DM. Education and support given.  Reviewed medications with patient and discussed importance of compliance. The patient is compliant with medications. Is compliant with medications. States she is now taking medications for her cholesterol. Denies any new concerns with her medications;  Discussed plans with patient for ongoing care management follow up and provided patient with direct contact information for care management team; Advised patient, providing education and rationale, to monitor blood pressure daily and record, calling PCP for findings outside established parameters;  Reviewed scheduled/upcoming provider appointments including: 01-13-2023 at 3 pm. Knows to call for changes or needs  Advised patient to discuss changes in her blood pressures, parameters of when to hold her medications and other questions or concerns about heart healthy with provider; Provided education on prescribed diet heart healthy;  Discussed complications of poorly controlled blood pressure such as heart disease, stroke, circulatory complications, vision complications, kidney impairment, sexual dysfunction;  Screening for signs and symptoms of depression related to chronic disease state;  Assessed social determinant of health barriers;  The patient has been having some hypotension since starting blood pressure medications. She has stopped on her own with taking the medications as she felt her blood pressure was too low. She states her blood pressure only goes up if she is stressed.   Symptom Management: Take medications as prescribed   Attend all scheduled provider appointments Call pharmacy for medication refills 3-7 days in advance of running out of medications Call provider office for new concerns or questions  call the Suicide and Crisis  Lifeline: 988 call the [place] National Suicide Prevention Lifeline: (613)684-4003 or TTY: 805-385-6225 TTY (548) 162-2582) to talk to a trained counselor call 1-800-273-TALK (toll free, 24 hour hotline) if experiencing a Mental Health or Behavioral Health Crisis  check blood pressure 3 times per week learn about high blood pressure keep a blood pressure log take blood pressure log to all doctor appointments call doctor for signs and symptoms of high blood pressure develop an action plan for high blood pressure keep all doctor appointments take medications for blood pressure exactly as prescribed report new symptoms to your doctor  Follow Up Plan: Telephone follow up appointment with care management team member scheduled for: 11-23-2022 at 1 pm          The patient verbalized understanding of instructions, educational materials, and care plan provided today and DECLINED offer to receive copy of patient instructions, educational materials, and care plan.  Telephone follow up appointment with care management team member scheduled for: 11-23-2022 at 1 pm

## 2022-09-28 ENCOUNTER — Other Ambulatory Visit: Payer: Medicare HMO

## 2022-10-04 ENCOUNTER — Other Ambulatory Visit: Payer: Self-pay | Admitting: Internal Medicine

## 2022-10-05 NOTE — Telephone Encounter (Signed)
Requested medication (s) are due for refill today: yes  Requested medication (s) are on the active medication list: yes  Last refill:  09/07/22  Future visit scheduled: yes  Notes to clinic:  Unable to refill per protocol, cannot delegate.      Requested Prescriptions  Pending Prescriptions Disp Refills   clonazePAM (KLONOPIN) 0.5 MG tablet [Pharmacy Med Name: CLONAZEPAM 0.5 MG TAB] 90 tablet     Sig: TAKE 1 TABLET BY MOUTH 3 TIMES A DAY AS NEEDED     Not Delegated - Psychiatry: Anxiolytics/Hypnotics 2 Failed - 10/04/2022  2:46 PM      Failed - This refill cannot be delegated      Failed - Urine Drug Screen completed in last 360 days      Passed - Patient is not pregnant      Passed - Valid encounter within last 6 months    Recent Outpatient Visits           3 months ago Aortic atherosclerosis [facility])   [place] [facility] [place], Salvadore Oxford, NP   8 months ago Primary hypertension   [place] [facility] [place], Salvadore Oxford, NP   9 months ago Encounter for general adult medical examination with abnormal findings   [place] [facility] [place], Salvadore Oxford, NP   10 months ago Anxiety   [place] [facility] [place], Jackelyn Poling, RPH-CPP   1 year ago Aortic atherosclerosis [facility])   [place] [facility] [place], Salvadore Oxford, NP       Future Appointments             In 3 months Baity, Salvadore Oxford, NP [place] [facility], [facility]

## 2022-10-07 ENCOUNTER — Other Ambulatory Visit: Payer: Medicare HMO

## 2022-10-13 ENCOUNTER — Telehealth: Payer: Medicare HMO

## 2022-10-13 DIAGNOSIS — F3289 Other specified depressive episodes: Secondary | ICD-10-CM

## 2022-10-13 DIAGNOSIS — I10 Essential (primary) hypertension: Secondary | ICD-10-CM

## 2022-10-19 ENCOUNTER — Other Ambulatory Visit: Payer: Medicare HMO

## 2022-10-29 ENCOUNTER — Other Ambulatory Visit: Payer: Medicare HMO

## 2022-11-01 ENCOUNTER — Other Ambulatory Visit: Payer: Self-pay | Admitting: Internal Medicine

## 2022-11-02 NOTE — Telephone Encounter (Signed)
Requested medication (s) are due for refill today - yes  Requested medication (s) are on the active medication list -yes  Future visit scheduled -yes  Last refill: -10/06/22 #90  Notes to clinic: non delegated Rx  Requested Prescriptions  Pending Prescriptions Disp Refills   clonazePAM (KLONOPIN) 0.5 MG tablet [Pharmacy Med Name: CLONAZEPAM 0.5 MG TAB] 90 tablet     Sig: TAKE 1 TABLET BY MOUTH 3 TIMES A DAY AS NEEDED     Not Delegated - Psychiatry: Anxiolytics/Hypnotics 2 Failed - 11/01/2022  2:07 PM      Failed - This refill cannot be delegated      Failed - Urine Drug Screen completed in last 360 days      Passed - Patient is not pregnant      Passed - Valid encounter within last 6 months    Recent Outpatient Visits           4 months ago Aortic atherosclerosis (HCC)   [place] [facility] [place], Salvadore Oxford, NP   9 months ago Primary hypertension   [place] [facility] [place], Salvadore Oxford, NP   9 months ago Encounter for general adult medical examination with abnormal findings   [place] [facility] [place], Salvadore Oxford, NP   11 months ago Anxiety   [place] [facility] [place], Gentry Fitz A, RPH-CPP   1 year ago Aortic atherosclerosis [facility])   [place] [facility] [place], Salvadore Oxford, NP       Future Appointments             In 2 months Baity, Salvadore Oxford, NP [facility], [facility]               Requested Prescriptions  Pending Prescriptions Disp Refills   clonazePAM (KLONOPIN) 0.5 MG tablet [Pharmacy Med Name: CLONAZEPAM 0.5 MG TAB] 90 tablet     Sig: TAKE 1 TABLET BY MOUTH 3 TIMES A DAY AS NEEDED     Not Delegated - Psychiatry: Anxiolytics/Hypnotics 2 Failed - 11/01/2022  2:07 PM      Failed - This refill cannot be delegated      Failed - Urine Drug Screen completed in last 360 days      Passed - Patient is not pregnant      Passed - Valid encounter  within last 6 months    Recent Outpatient Visits           4 months ago Aortic atherosclerosis [facility])   [place] [facility] [place], Salvadore Oxford, NP   9 months ago Primary hypertension   [place] [facility] [place], Salvadore Oxford, NP   9 months ago Encounter for general adult medical examination with abnormal findings   [place] [facility] [place], Salvadore Oxford, NP   11 months ago Anxiety   [place] [facility] [place], Gentry Fitz A, RPH-CPP   1 year ago Aortic atherosclerosis [facility])   [place] [facility] [place], Salvadore Oxford, NP       Future Appointments             In 2 months Baity, Salvadore Oxford, NP  [facility], [facility]

## 2022-11-10 ENCOUNTER — Telehealth: Payer: Self-pay | Admitting: Internal Medicine

## 2022-11-10 NOTE — Telephone Encounter (Signed)
Pt requests that a nurse return her call to advise if the labs that are ordered require her to fast. Cb# [phone]

## 2022-11-10 NOTE — Telephone Encounter (Signed)
Called patient to advise her that she needs to fast for up coming lab work. No answer, left VM.

## 2022-11-10 NOTE — Telephone Encounter (Signed)
LMTCB 11/10/2022.  PEC please advise pt when she calls back.   Thanks,   -Vernona Rieger

## 2022-11-12 ENCOUNTER — Other Ambulatory Visit: Payer: Medicare HMO

## 2022-11-23 ENCOUNTER — Other Ambulatory Visit: Payer: Medicare HMO

## 2022-11-23 ENCOUNTER — Other Ambulatory Visit: Payer: Self-pay

## 2022-11-23 NOTE — Patient Outreach (Signed)
Care Management   Visit Note  11/23/2022 Name: Katrina Sutton MRN: [id number] DOB: 1950-08-04  Subjective: Katrina Sutton is a 72 y.o. year old female who is a primary care patient of Lorre Munroe, NP. The Care Management team was consulted for assistance.      Engaged with patient spoke with patient by telephone.    Goals Addressed             This Visit's Progress    RNCM Care Management  Expected Outcome:  Monitor, Self-Manage and Reduce Symptoms HQ:[id number] disorder and anxiety       Current Barriers:  Knowledge Deficits related to not understanding why her urine test did not show positive for her klonopin as she takes it as directed.  Care Coordination needs related to ongoing education and support needed  in a patient with bipolar disorder and anxiety Chronic Disease Management support and education needs related to effective management of bipolar disorder and anxiety Lacks caregiver support.   Planned Interventions: Evaluation of current treatment plan related to bipolar disorder and anxiety and patient's adherence to plan as established by provider. The patient is having a good day today. She denies any acute changes in her mental health. She is having some stress due to her husband being sick and "ill". She feels that she has the responsibility of making sure the household stays intact and running. Reflective listening and support.  Advised patient to call the office for changes in her mood, anxiety, depression or mental health needs   Reviewed medications with patient and discussed compliance. The patient endorses compliance with medications. Denies any new medication needs at this time. Has labs coming up soon. Reviewed scheduled/upcoming provider appointments including 01-13-2023 at 3pm Discussed plans with patient for ongoing care management follow up and provided patient with direct contact information for care management team Advised patient to discuss changes in her mental  health needs with provider Screening for signs and symptoms of depression related to chronic disease state  Assessed social determinant of health barriers  Symptom Management: Take medications as prescribed   Attend all scheduled provider appointments Call provider office for new concerns or questions  Work with the social worker to address care coordination needs and will continue to work with the clinical team to address health care and disease management related needs call the Suicide and Crisis Lifeline: 988 call the [place] National Suicide Prevention Lifeline: 813-139-9782 or TTY: 4370217330 TTY (260)154-4177) to talk to a trained counselor call 1-800-273-TALK (toll free, 24 hour hotline) if experiencing a Mental Health or Behavioral Health Crisis   Follow Up Plan: Telephone follow up appointment with care management team member scheduled for: 01-07-2023 at 1 pm       RNCM Care Management  Expected Outcome:  Monitor, Self-Manage, and Reduce Symptoms of Hypertension       Current Barriers:  Knowledge Deficits related to controlling blood pressures and monitoring for factors that stress her out and increase her blood pressures Care Coordination needs related to patient dealing with some hypotension since starting medications and not knowing when to take medications or not in a patient with HTN Chronic Disease Management support and education needs related to effective management of HTN Lacks caregiver support.  BP Readings from Last 3 Encounters:  06/24/22 126/74  01/22/22 132/84  01/08/22 (!) 142/94     Planned Interventions: Evaluation of current treatment plan related to hypertension self management and patient's adherence to plan as established by provider. The patient  denies any acute changes in her HTN or heart health. She does have an elevation in her blood pressures when she gets stressed out or overwhelmed. The patient with more stable blood pressures at this time.  She  takes her blood pressures at home about 3 times a week. The patient denies any changes in her health and well being.  Provided education to patient re: stroke prevention, s/s of heart attack and stroke; Reviewed prescribed diet heart healthy diet. The patient is compliant with heart healthy diet. Eating well and denies any acute changes in her dietary habits. She states she is monitoring sweets and salts as she found out she is pre-DM. Education and support given.  Reviewed medications with patient and discussed importance of compliance. The patient is compliant with medications. Is compliant with medications. States she is now taking medications for her cholesterol. Denies any new concerns with her medications;  Discussed plans with patient for ongoing care management follow up and provided patient with direct contact information for care management team; Advised patient, providing education and rationale, to monitor blood pressure daily and record, calling PCP for findings outside established parameters;  Reviewed scheduled/upcoming provider appointments including: 01-13-2023 at 3 pm. Knows to call for changes or needs  Advised patient to discuss changes in her blood pressures, parameters of when to hold her medications and other questions or concerns about heart healthy with provider; Provided education on prescribed diet heart healthy;  Discussed complications of poorly controlled blood pressure such as heart disease, stroke, circulatory complications, vision complications, kidney impairment, sexual dysfunction;  Screening for signs and symptoms of depression related to chronic disease state;  Assessed social determinant of health barriers;  The patient has been having some hypotension since starting blood pressure medications. She has stopped on her own with taking the medications as she felt her blood pressure was too low. She states her blood pressure only goes up if she is stressed.   Symptom  Management: Take medications as prescribed   Attend all scheduled provider appointments Call pharmacy for medication refills 3-7 days in advance of running out of medications Call provider office for new concerns or questions  call the Suicide and Crisis Lifeline: 988 call the [place] National Suicide Prevention Lifeline: 769-402-4674 or TTY: 3477206596 TTY (618)488-8056) to talk to a trained counselor call 1-800-273-TALK (toll free, 24 hour hotline) if experiencing a Mental Health or Behavioral Health Crisis  check blood pressure 3 times per week learn about high blood pressure keep a blood pressure log take blood pressure log to all doctor appointments call doctor for signs and symptoms of high blood pressure develop an action plan for high blood pressure keep all doctor appointments take medications for blood pressure exactly as prescribed report new symptoms to your doctor  Follow Up Plan: Telephone follow up appointment with care management team member scheduled for: 01-07-2023 at 1 pm       RNM Care Management Expected Outcome:  Monitor, Self-Manage and Reduce Symptoms of: GERD       Current Barriers:  Knowledge Deficits related to how to control triggers that cause exacerbation of GERD Care Coordination needs related to resources and ongoing education needs  in a patient with GERD Chronic Disease Management support and education needs related to Effective management of GERD Lacks caregiver support.   Planned Interventions: Evaluation of current treatment plan related to GERD and patient's adherence to plan as established by provider. The patient states her GERD and GI sx and sx are  stable. She states her constipation is better, still some concerns. Review and education. She is sleeping on more than one pillow now and this is helping her breath better and get better rest. Advised patient to call the office for changes in her sx and sx of GERD Provided education to patient re:  foods to avoid that will cause exacerbation, monitoring for changes, keeping stress levels down, and notifying the provider for changes in her condition. Review of dietary habits. The patient is eating fruits and vegetables. Discussed hydration and monitoring for acute changes in dietary habits that may cause constipation. The patient saw provider recently and discussed her concerns. The patient is staying hydrated and denies any acute changes in her dietary habits Reviewed medications with patient and discussed compliance, the patient states compliance with medications. Has had an increase in her Protonix and this is working well for her at this time.  Reviewed scheduled/upcoming provider appointments including 01-13-2023 at 3 pm, has upcoming lab work on 11-25-2022 Discussed plans with patient for ongoing care management follow up and provided patient with direct contact information for care management team Advised patient to discuss changes in her GERD, questions or concerns with provider Screening for signs and symptoms of depression related to chronic disease state  Assessed social determinant of health barriers  Symptom Management: Take medications as prescribed   Attend all scheduled provider appointments Call provider office for new concerns or questions  call the Suicide and Crisis Lifeline: 988 call the [place] National Suicide Prevention Lifeline: 4043098107 or TTY: 704-172-5954 TTY 3106164556) to talk to a trained counselor call 1-800-273-TALK (toll free, 24 hour hotline) if experiencing a Mental Health or Behavioral Health Crisis   Follow Up Plan: Telephone follow up appointment with care management team member scheduled for: 01-07-2023  at 1 pm           Consent to Services:  Patient was given information about care management services, agreed to services, and gave verbal consent to participate.   Plan: Telephone follow up appointment with care management team member  scheduled for: 01-07-2023 at 1 pm  Alto Denver RN, MSN, CCM RN Care Manager  [facility]  Ambulatory Care Management  Direct Number: [phone]

## 2022-11-23 NOTE — Patient Instructions (Signed)
Visit Information  Thank you for taking time to visit with me today. Please don't hesitate to contact me if I can be of assistance to you before our next scheduled telephone appointment.  Following are the goals we discussed today:   Goals Addressed             This Visit's Progress    RNCM Care Management  Expected Outcome:  Monitor, Self-Manage and Reduce Symptoms XL:[id number] disorder and anxiety       Current Barriers:  Knowledge Deficits related to not understanding why her urine test did not show positive for her klonopin as she takes it as directed.  Care Coordination needs related to ongoing education and support needed  in a patient with bipolar disorder and anxiety Chronic Disease Management support and education needs related to effective management of bipolar disorder and anxiety Lacks caregiver support.   Planned Interventions: Evaluation of current treatment plan related to bipolar disorder and anxiety and patient's adherence to plan as established by provider. The patient is having a good day today. She denies any acute changes in her mental health. She is having some stress due to her husband being sick and "ill". She feels that she has the responsibility of making sure the household stays intact and running. Reflective listening and support.  Advised patient to call the office for changes in her mood, anxiety, depression or mental health needs   Reviewed medications with patient and discussed compliance. The patient endorses compliance with medications. Denies any new medication needs at this time. Has labs coming up soon. Reviewed scheduled/upcoming provider appointments including 01-13-2023 at 3pm Discussed plans with patient for ongoing care management follow up and provided patient with direct contact information for care management team Advised patient to discuss changes in her mental health needs with provider Screening for signs and symptoms of depression related to  chronic disease state  Assessed social determinant of health barriers  Symptom Management: Take medications as prescribed   Attend all scheduled provider appointments Call provider office for new concerns or questions  Work with the social worker to address care coordination needs and will continue to work with the clinical team to address health care and disease management related needs call the Suicide and Crisis Lifeline: 988 call the [place] National Suicide Prevention Lifeline: 860 028 3532 or TTY: (747)033-9904 TTY (605) 751-4690) to talk to a trained counselor call 1-800-273-TALK (toll free, 24 hour hotline) if experiencing a Mental Health or Behavioral Health Crisis   Follow Up Plan: Telephone follow up appointment with care management team member scheduled for: 01-07-2023 at 1 pm       RNCM Care Management  Expected Outcome:  Monitor, Self-Manage, and Reduce Symptoms of Hypertension       Current Barriers:  Knowledge Deficits related to controlling blood pressures and monitoring for factors that stress her out and increase her blood pressures Care Coordination needs related to patient dealing with some hypotension since starting medications and not knowing when to take medications or not in a patient with HTN Chronic Disease Management support and education needs related to effective management of HTN Lacks caregiver support.  BP Readings from Last 3 Encounters:  06/24/22 126/74  01/22/22 132/84  01/08/22 (!) 142/94     Planned Interventions: Evaluation of current treatment plan related to hypertension self management and patient's adherence to plan as established by provider. The patient denies any acute changes in her HTN or heart health. She does have an elevation in her blood pressures when  she gets stressed out or overwhelmed. The patient with more stable blood pressures at this time.  She takes her blood pressures at home about 3 times a week. The patient denies any changes in  her health and well being.  Provided education to patient re: stroke prevention, s/s of heart attack and stroke; Reviewed prescribed diet heart healthy diet. The patient is compliant with heart healthy diet. Eating well and denies any acute changes in her dietary habits. She states she is monitoring sweets and salts as she found out she is pre-DM. Education and support given.  Reviewed medications with patient and discussed importance of compliance. The patient is compliant with medications. Is compliant with medications. States she is now taking medications for her cholesterol. Denies any new concerns with her medications;  Discussed plans with patient for ongoing care management follow up and provided patient with direct contact information for care management team; Advised patient, providing education and rationale, to monitor blood pressure daily and record, calling PCP for findings outside established parameters;  Reviewed scheduled/upcoming provider appointments including: 01-13-2023 at 3 pm. Knows to call for changes or needs  Advised patient to discuss changes in her blood pressures, parameters of when to hold her medications and other questions or concerns about heart healthy with provider; Provided education on prescribed diet heart healthy;  Discussed complications of poorly controlled blood pressure such as heart disease, stroke, circulatory complications, vision complications, kidney impairment, sexual dysfunction;  Screening for signs and symptoms of depression related to chronic disease state;  Assessed social determinant of health barriers;  The patient has been having some hypotension since starting blood pressure medications. She has stopped on her own with taking the medications as she felt her blood pressure was too low. She states her blood pressure only goes up if she is stressed.   Symptom Management: Take medications as prescribed   Attend all scheduled provider  appointments Call pharmacy for medication refills 3-7 days in advance of running out of medications Call provider office for new concerns or questions  call the Suicide and Crisis Lifeline: 988 call the [place] National Suicide Prevention Lifeline: 754-708-4828 or TTY: 727-680-9522 TTY 505-066-5881) to talk to a trained counselor call 1-800-273-TALK (toll free, 24 hour hotline) if experiencing a Mental Health or Behavioral Health Crisis  check blood pressure 3 times per week learn about high blood pressure keep a blood pressure log take blood pressure log to all doctor appointments call doctor for signs and symptoms of high blood pressure develop an action plan for high blood pressure keep all doctor appointments take medications for blood pressure exactly as prescribed report new symptoms to your doctor  Follow Up Plan: Telephone follow up appointment with care management team member scheduled for: 01-07-2023 at 1 pm       RNM Care Management Expected Outcome:  Monitor, Self-Manage and Reduce Symptoms of: GERD       Current Barriers:  Knowledge Deficits related to how to control triggers that cause exacerbation of GERD Care Coordination needs related to resources and ongoing education needs  in a patient with GERD Chronic Disease Management support and education needs related to Effective management of GERD Lacks caregiver support.   Planned Interventions: Evaluation of current treatment plan related to GERD and patient's adherence to plan as established by provider. The patient states her GERD and GI sx and sx are stable. She states her constipation is better, still some concerns. Review and education. She is sleeping on more than one  pillow now and this is helping her breath better and get better rest. Advised patient to call the office for changes in her sx and sx of GERD Provided education to patient re: foods to avoid that will cause exacerbation, monitoring for changes, keeping  stress levels down, and notifying the provider for changes in her condition. Review of dietary habits. The patient is eating fruits and vegetables. Discussed hydration and monitoring for acute changes in dietary habits that may cause constipation. The patient saw provider recently and discussed her concerns. The patient is staying hydrated and denies any acute changes in her dietary habits Reviewed medications with patient and discussed compliance, the patient states compliance with medications. Has had an increase in her Protonix and this is working well for her at this time.  Reviewed scheduled/upcoming provider appointments including 01-13-2023 at 3 pm, has upcoming lab work on 11-25-2022 Discussed plans with patient for ongoing care management follow up and provided patient with direct contact information for care management team Advised patient to discuss changes in her GERD, questions or concerns with provider Screening for signs and symptoms of depression related to chronic disease state  Assessed social determinant of health barriers  Symptom Management: Take medications as prescribed   Attend all scheduled provider appointments Call provider office for new concerns or questions  call the Suicide and Crisis Lifeline: 988 call the [place] National Suicide Prevention Lifeline: 586-642-3706 or TTY: (475)868-7903 TTY (607)071-1729) to talk to a trained counselor call 1-800-273-TALK (toll free, 24 hour hotline) if experiencing a Mental Health or Behavioral Health Crisis   Follow Up Plan: Telephone follow up appointment with care management team member scheduled for: 01-07-2023  at 1 pm           Our next appointment is by telephone on 01-07-2023 at 1 pm  Please call the care guide team at [phone] if you need to cancel or reschedule your appointment.   If you are experiencing a Mental Health or Behavioral Health Crisis or need someone to talk to, please call the Suicide and Crisis  Lifeline: 988 call the [place] National Suicide Prevention Lifeline: 6047834929 or TTY: (424) 423-5855 TTY 867-601-4856) to talk to a trained counselor call 1-800-273-TALK (toll free, 24 hour hotline) go to [facility] [address], [place] [id number])   The patient verbalized understanding of instructions, educational materials, and care plan provided today and DECLINED offer to receive copy of patient instructions, educational materials, and care plan.   Telephone follow up appointment with care management team member scheduled for: 01-07-2023 at 1 pm  Alto Denver RN, MSN, CCM RN Care Manager  [facility]  Ambulatory Care Management  Direct Number: [phone]

## 2022-11-25 ENCOUNTER — Other Ambulatory Visit: Payer: Medicare HMO

## 2022-11-29 ENCOUNTER — Other Ambulatory Visit: Payer: Self-pay | Admitting: Internal Medicine

## 2022-11-30 NOTE — Telephone Encounter (Signed)
Requested medication (s) are due for refill today na   Requested medication (s) are on the active medication list: yes   Last refill:  11/02/22 #90 0 refills  Future visit scheduled: yes in 1 month   Notes to clinic:  not delegated per protocol. Do you want to refill Rx?     Requested Prescriptions  Pending Prescriptions Disp Refills   clonazePAM (KLONOPIN) 0.5 MG tablet [Pharmacy Med Name: CLONAZEPAM 0.5 MG TAB] 90 tablet     Sig: TAKE 1 TABLET BY MOUTH 3 TIMES A DAY AS NEEDED     Not Delegated - Psychiatry: Anxiolytics/Hypnotics 2 Failed - 11/29/2022  9:56 AM      Failed - This refill cannot be delegated      Failed - Urine Drug Screen completed in last 360 days      Passed - Patient is not pregnant      Passed - Valid encounter within last 6 months    Recent Outpatient Visits           5 months ago Aortic atherosclerosis [facility])   [place] [facility] [place], Salvadore Oxford, NP   10 months ago Primary hypertension   [place] [facility] [place], Salvadore Oxford, NP   10 months ago Encounter for general adult medical examination with abnormal findings   [place] [facility] [place], Salvadore Oxford, NP   1 year ago Anxiety   [place] [facility] [place], Jackelyn Poling, RPH-CPP   1 year ago Aortic atherosclerosis [facility])   [place] [facility] [place], Salvadore Oxford, NP       Future Appointments             In 1 month [place], Salvadore Oxford, NP [place] [facility], [facility]

## 2022-12-09 ENCOUNTER — Other Ambulatory Visit: Payer: Medicare HMO

## 2022-12-15 ENCOUNTER — Other Ambulatory Visit: Payer: Self-pay | Admitting: Internal Medicine

## 2022-12-15 NOTE — Telephone Encounter (Signed)
Requested Prescriptions  Pending Prescriptions Disp Refills   pantoprazole (PROTONIX) 40 MG tablet [Pharmacy Med Name: PANTOPRAZOLE SODIUM 40 MG DR TAB] 180 tablet 0    Sig: TAKE 1 TABLET BY MOUTH TWICE A DAY     Gastroenterology: Proton Pump Inhibitors Passed - 12/15/2022  9:49 AM      Passed - Valid encounter within last 12 months    Recent Outpatient Visits           5 months ago Aortic atherosclerosis [facility])   [place] [facility] [place], Salvadore Oxford, NP   10 months ago Primary hypertension   [place] [facility] [place], Salvadore Oxford, NP   11 months ago Encounter for general adult medical examination with abnormal findings   [place] [facility] [place], Salvadore Oxford, NP   1 year ago Anxiety   [place] [facility] [place], Jackelyn Poling, RPH-CPP   1 year ago Aortic atherosclerosis [facility])   [place] [facility] [place], Salvadore Oxford, NP       Future Appointments             In 4 weeks [place], Salvadore Oxford, NP [place] [facility], [facility]

## 2022-12-17 ENCOUNTER — Other Ambulatory Visit: Payer: Self-pay

## 2022-12-17 DIAGNOSIS — E7800 Pure hypercholesterolemia, unspecified: Secondary | ICD-10-CM

## 2022-12-20 ENCOUNTER — Other Ambulatory Visit: Payer: Medicare HMO

## 2022-12-27 ENCOUNTER — Other Ambulatory Visit: Payer: Self-pay | Admitting: Internal Medicine

## 2022-12-28 ENCOUNTER — Other Ambulatory Visit: Payer: Self-pay | Admitting: Internal Medicine

## 2022-12-28 NOTE — Telephone Encounter (Signed)
Medication Refill - Medication: clonazePAM (KLONOPIN) 0.5 MG tablet [409  Has the patient contacted their pharmacy? Yes.   ( Preferred Pharmacy (with phone number or street name):  [facility] - [place], [place] - [address]  [address] [place], [place] [address]  Phone:  [phone]  Fax:  [phone]  DEA #:  -- [id number] Reason: --    Has the patient been seen for an appointment in the last year OR does the patient have an upcoming appointment? Yes.    Agent: Please be advised that RX refills may take up to 3 business days. We ask that you follow-up with your pharmacy.

## 2022-12-28 NOTE — Telephone Encounter (Signed)
Requested medication (s) are due for refill today: yes  Requested medication (s) are on the active medication list: yes  Last refill:  11/30/22 #90  Future visit scheduled: yes  Notes to clinic:  med not delegated to NT to RF   Requested Prescriptions  Pending Prescriptions Disp Refills   clonazePAM (KLONOPIN) 0.5 MG tablet [Pharmacy Med Name: CLONAZEPAM 0.5 MG TAB] 90 tablet     Sig: TAKE 1 TABLET BY MOUTH 3 TIMES A DAY AS NEEDED     Not Delegated - Psychiatry: Anxiolytics/Hypnotics 2 Failed - 12/27/2022  9:38 AM      Failed - This refill cannot be delegated      Failed - Urine Drug Screen completed in last 360 days      Passed - Patient is not pregnant      Passed - Valid encounter within last 6 months    Recent Outpatient Visits           6 months ago Aortic atherosclerosis [facility])   [place] [facility] [place], Salvadore Oxford, NP   11 months ago Primary hypertension   [place] [facility] [place], Salvadore Oxford, NP   11 months ago Encounter for general adult medical examination with abnormal findings   [place] [facility] [place], Salvadore Oxford, NP   1 year ago Anxiety   [place] [facility] D[place], Gentry Fitz A, RPH-CPP   1 year ago Aortic atherosclerosis [facility])   [place] [facility] [place], Salvadore Oxford, NP       Future Appointments             In 2 weeks Sampson Si, Salvadore Oxford, NP  [facility], [facility]

## 2022-12-29 NOTE — Telephone Encounter (Signed)
Requested medication (s) are due for refill today: No  Requested medication (s) are on the active medication list: Yes  Last refill:  12/29/22  Future visit scheduled: Yes  Notes to clinic:  Not delegated, duplicate request.    Requested Prescriptions  Pending Prescriptions Disp Refills   clonazePAM (KLONOPIN) 0.5 MG tablet 90 tablet 0    Sig: Take 1 tablet (0.5 mg total) by mouth 3 (three) times daily as needed.     Not Delegated - Psychiatry: Anxiolytics/Hypnotics 2 Failed - 12/28/2022  3:44 PM      Failed - This refill cannot be delegated      Failed - Urine Drug Screen completed in last 360 days      Passed - Patient is not pregnant      Passed - Valid encounter within last 6 months    Recent Outpatient Visits           6 months ago Aortic atherosclerosis [facility])   [place] [facility] [place], Salvadore Oxford, NP   11 months ago Primary hypertension   [place] [facility] [place], Salvadore Oxford, NP   11 months ago Encounter for general adult medical examination with abnormal findings   [place] [facility] [place], Salvadore Oxford, NP   1 year ago Anxiety   [place] [facility] Delles, Jackelyn Poling, RPH-CPP   1 year ago Aortic atherosclerosis [facility])   [place] [facility] [place], Salvadore Oxford, NP       Future Appointments             In 2 weeks Sampson Si, Salvadore Oxford, NP [place] [facility], [facility]

## 2022-12-30 ENCOUNTER — Other Ambulatory Visit: Payer: Self-pay

## 2022-12-30 DIAGNOSIS — E7800 Pure hypercholesterolemia, unspecified: Secondary | ICD-10-CM

## 2022-12-30 DIAGNOSIS — E663 Overweight: Secondary | ICD-10-CM

## 2022-12-30 DIAGNOSIS — I10 Essential (primary) hypertension: Secondary | ICD-10-CM

## 2022-12-31 ENCOUNTER — Other Ambulatory Visit: Payer: Medicare HMO

## 2022-12-31 DIAGNOSIS — I10 Essential (primary) hypertension: Secondary | ICD-10-CM

## 2022-12-31 DIAGNOSIS — E7800 Pure hypercholesterolemia, unspecified: Secondary | ICD-10-CM

## 2022-12-31 DIAGNOSIS — E663 Overweight: Secondary | ICD-10-CM

## 2023-01-07 ENCOUNTER — Other Ambulatory Visit: Payer: Medicare HMO

## 2023-01-07 ENCOUNTER — Other Ambulatory Visit: Payer: Self-pay | Admitting: *Deleted

## 2023-01-07 ENCOUNTER — Other Ambulatory Visit: Payer: Self-pay

## 2023-01-07 ENCOUNTER — Telehealth: Payer: Self-pay | Admitting: *Deleted

## 2023-01-07 NOTE — Patient Outreach (Signed)
Care Management   Follow Up Note   01/07/2023 Name: Katrina Sutton MRN: [id number] DOB: [birth year]   Referred by: Lorre Munroe, NP Reason for referral : Care Management (RNCM: Attempt to follow up for Chronic Disease Management & Care Coordination Needs)   An unsuccessful telephone outreach was attempted today. The patient was referred to the case management team for assistance with care management and care coordination.   Follow Up Plan:  RNCM will attempt call again due to being unable to leave voicemail  Danise Edge, BSN RN RN Care Manager  [facility]  Ambulatory Care Management  Direct Number: [phone]

## 2023-01-07 NOTE — Patient Instructions (Signed)
Visit Information  Thank you for taking time to visit with me today. Please don't hesitate to contact me if I can be of assistance to you before our next scheduled telephone appointment.  Following are the goals we discussed today:   Goals Addressed             This Visit's Progress    RNCM Care Management  Expected Outcome:  Monitor, Self-Manage and Reduce Symptoms UE:[id number] disorder and anxiety       Current Barriers:  Knowledge Deficits related to not understanding why her urine test did not show positive for her klonopin as she takes it as directed.  Care Coordination needs related to ongoing education and support needed  in a patient with bipolar disorder and anxiety Chronic Disease Management support and education needs related to effective management of bipolar disorder and anxiety Lacks caregiver support.   Planned Interventions: Evaluation of current treatment plan related to bipolar disorder and anxiety and patient's adherence to plan as established by provider. The patient is having a good day today. She denies any acute changes in her mental health. Reflective listening and support.  Advised patient to call the office for changes in her mood, anxiety, depression or mental health needs   Reviewed medications with patient and discussed compliance. The patient endorses compliance with medications. Denies any new medication needs at this time. Had labs drawn this morning. Reviewed scheduled/upcoming provider appointments including 01-13-2023 at 3pm Discussed plans with patient for ongoing care management follow up and provided patient with direct contact information for care management team Advised patient to discuss changes in her mental health needs with provider Screening for signs and symptoms of depression related to chronic disease state  Assessed social determinant of health barriers  Symptom Management: Take medications as prescribed   Attend all scheduled provider  appointments Call provider office for new concerns or questions  Work with the social worker to address care coordination needs and will continue to work with the clinical team to address health care and disease management related needs call the Suicide and Crisis Lifeline: 988 call the [place] National Suicide Prevention Lifeline: 8480253136 or TTY: (530) 531-7270 TTY 640-590-6416) to talk to a trained counselor call 1-800-273-TALK (toll free, 24 hour hotline) if experiencing a Mental Health or Behavioral Health Crisis   Follow Up Plan: Telephone follow up appointment with care management team member scheduled for: 03-04-2023 at 1 pm       RNCM Care Management  Expected Outcome:  Monitor, Self-Manage, and Reduce Symptoms of Hypertension       Current Barriers:  Knowledge Deficits related to controlling blood pressures and monitoring for factors that stress her out and increase her blood pressures Care Coordination needs related to patient dealing with some hypotension since starting medications and not knowing when to take medications or not in a patient with HTN Chronic Disease Management support and education needs related to effective management of HTN Lacks caregiver support.  BP Readings from Last 3 Encounters:  06/24/22 126/74  01/22/22 132/84  01/08/22 (!) 142/94     Planned Interventions: Evaluation of current treatment plan related to hypertension self management and patient's adherence to plan as established by provider. The patient denies any acute changes in her HTN or heart health.  The patient with more stable blood pressures at this time.  She takes states her BP ranges from 110-115/70's. The patient denies any changes in her health and well being.  Provided education to patient re: stroke prevention,  s/s of heart attack and stroke; Reviewed prescribed diet heart healthy diet. The patient is compliant with heart healthy diet. Eating well and denies any acute changes in her  dietary habits. She states she is monitoring sweets and salts as she found out she is pre-DM. Education and support given.  Reviewed medications with patient and discussed importance of compliance. The patient is compliant with medications. Is compliant with medications. States she is now taking medications for her cholesterol. Denies any new concerns with her medications;  Discussed plans with patient for ongoing care management follow up and provided patient with direct contact information for care management team; Advised patient, providing education and rationale, to monitor blood pressure daily and record, calling PCP for findings outside established parameters;  Reviewed scheduled/upcoming provider appointments including: 01-13-2023 at 3 pm. Knows to call for changes or needs  Advised patient to discuss changes in her blood pressures, parameters of when to hold her medications and other questions or concerns about heart healthy with provider; Provided education on prescribed diet heart healthy;  Discussed complications of poorly controlled blood pressure such as heart disease, stroke, circulatory complications, vision complications, kidney impairment, sexual dysfunction;  Screening for signs and symptoms of depression related to chronic disease state;  Assessed social determinant of health barriers;    Symptom Management: Take medications as prescribed   Attend all scheduled provider appointments Call pharmacy for medication refills 3-7 days in advance of running out of medications Call provider office for new concerns or questions  call the Suicide and Crisis Lifeline: 988 call the [place] National Suicide Prevention Lifeline: (614) 618-0975 or TTY: (249)776-5005 TTY (774)760-7586) to talk to a trained counselor call 1-800-273-TALK (toll free, 24 hour hotline) if experiencing a Mental Health or Behavioral Health Crisis  check blood pressure 3 times per week learn about high blood  pressure keep a blood pressure log take blood pressure log to all doctor appointments call doctor for signs and symptoms of high blood pressure develop an action plan for high blood pressure keep all doctor appointments take medications for blood pressure exactly as prescribed report new symptoms to your doctor  Follow Up Plan: Telephone follow up appointment with care management team member scheduled for: 03-04-2023 at 1 pm       RNM Care Management Expected Outcome:  Monitor, Self-Manage and Reduce Symptoms of: GERD       Current Barriers:  Knowledge Deficits related to how to control triggers that cause exacerbation of GERD Care Coordination needs related to resources and ongoing education needs  in a patient with GERD Chronic Disease Management support and education needs related to Effective management of GERD Lacks caregiver support.   Planned Interventions: Evaluation of current treatment plan related to GERD and patient's adherence to plan as established by provider. The patient states her GERD and GI sx and sx are stable. Advised patient to call the office for changes in her sx and sx of GERD Provided education to patient re: foods to avoid that will cause exacerbation, monitoring for changes, keeping stress levels down, and notifying the provider for changes in her condition. Review of dietary habits. The patient is eating fruits and vegetables. Discussed hydration and monitoring for acute changes in dietary habits that may cause constipation.  Reviewed medications with patient and discussed compliance, the patient states compliance with medications. Has had an increase in her Protonix to BID and this is working well for her at this time.  Reviewed scheduled/upcoming provider appointments including 01-13-2023 at 3  pm, lab work drawn this morning Discussed plans with patient for ongoing care management follow up and provided patient with direct contact information for care management  team Advised patient to discuss changes in her GERD, questions or concerns with provider Screening for signs and symptoms of depression related to chronic disease state  Assessed social determinant of health barriers  Symptom Management: Take medications as prescribed   Attend all scheduled provider appointments Call provider office for new concerns or questions  call the Suicide and Crisis Lifeline: 988 call the [place] National Suicide Prevention Lifeline: (873)656-4636 or TTY: 830-406-2322 TTY (215) 756-4295) to talk to a trained counselor call 1-800-273-TALK (toll free, 24 hour hotline) if experiencing a Mental Health or Behavioral Health Crisis   Follow Up Plan: Telephone follow up appointment with care management team member scheduled for: 03-04-2023  at 1 pm           Our next appointment is by telephone on 03-04-2023 at 1:00 pm  Please call the care guide team at [phone] if you need to cancel or reschedule your appointment.   If you are experiencing a Mental Health or Behavioral Health Crisis or need someone to talk to, please call the Suicide and Crisis Lifeline: 988 call the [place] National Suicide Prevention Lifeline: (303)291-3988 or TTY: 650-755-7286 TTY 867-346-3025) to talk to a trained counselor call 1-800-273-TALK (toll free, 24 hour hotline) call 911   The patient verbalized understanding of instructions, educational materials, and care plan provided today and DECLINED offer to receive copy of patient instructions, educational materials, and care plan.   Telephone follow up appointment with care management team member scheduled for: 03-04-2023 at 1:00 pm  Danise Edge, BSN RN RN Care Manager  [facility]  Ambulatory Care Management  Direct Number: [phone]

## 2023-01-07 NOTE — Patient Outreach (Signed)
Care Management   Visit Note  01/07/2023 Name: Katrina Sutton MRN: [id number] DOB: Jul 26, 1950  Subjective: Katrina Sutton is a 72 y.o. year old female who is a primary care patient of Lorre Munroe, NP. The Care Management team was consulted for assistance.      Engaged with patient spoke with patient by telephone.    Goals Addressed             This Visit's Progress    RNCM Care Management  Expected Outcome:  Monitor, Self-Manage and Reduce Symptoms EX:BMWUXLK disorder and anxiety       Current Barriers:  Knowledge Deficits related to not understanding why her urine test did not show positive for her klonopin as she takes it as directed.  Care Coordination needs related to ongoing education and support needed  in a patient with bipolar disorder and anxiety Chronic Disease Management support and education needs related to effective management of bipolar disorder and anxiety Lacks caregiver support.   Planned Interventions: Evaluation of current treatment plan related to bipolar disorder and anxiety and patient's adherence to plan as established by provider. The patient is having a good day today. She denies any acute changes in her mental health. Reflective listening and support.  Advised patient to call the office for changes in her mood, anxiety, depression or mental health needs   Reviewed medications with patient and discussed compliance. The patient endorses compliance with medications. Denies any new medication needs at this time. Had labs drawn this morning. Reviewed scheduled/upcoming provider appointments including 01-13-2023 at 3pm Discussed plans with patient for ongoing care management follow up and provided patient with direct contact information for care management team Advised patient to discuss changes in her mental health needs with provider Screening for signs and symptoms of depression related to chronic disease state  Assessed social determinant of health  barriers  Symptom Management: Take medications as prescribed   Attend all scheduled provider appointments Call provider office for new concerns or questions  Work with the social worker to address care coordination needs and will continue to work with the clinical team to address health care and disease management related needs call the Suicide and Crisis Lifeline: 988 call the [place] National Suicide Prevention Lifeline: 7690970134 or TTY: 930-704-0994 TTY 332-650-9769) to talk to a trained counselor call 1-800-273-TALK (toll free, 24 hour hotline) if experiencing a Mental Health or Behavioral Health Crisis   Follow Up Plan: Telephone follow up appointment with care management team member scheduled for: 03-04-2023 at 1 pm       RNCM Care Management  Expected Outcome:  Monitor, Self-Manage, and Reduce Symptoms of Hypertension       Current Barriers:  Knowledge Deficits related to controlling blood pressures and monitoring for factors that stress her out and increase her blood pressures Care Coordination needs related to patient dealing with some hypotension since starting medications and not knowing when to take medications or not in a patient with HTN Chronic Disease Management support and education needs related to effective management of HTN Lacks caregiver support.  BP Readings from Last 3 Encounters:  06/24/22 126/74  01/22/22 132/84  01/08/22 (!) 142/94     Planned Interventions: Evaluation of current treatment plan related to hypertension self management and patient's adherence to plan as established by provider. The patient denies any acute changes in her HTN or heart health.  The patient with more stable blood pressures at this time.  She takes states her BP ranges from  110-115/70's. The patient denies any changes in her health and well being.  Provided education to patient re: stroke prevention, s/s of heart attack and stroke; Reviewed prescribed diet heart healthy diet. The  patient is compliant with heart healthy diet. Eating well and denies any acute changes in her dietary habits. She states she is monitoring sweets and salts as she found out she is pre-DM. Education and support given.  Reviewed medications with patient and discussed importance of compliance. The patient is compliant with medications. Is compliant with medications. States she is now taking medications for her cholesterol. Denies any new concerns with her medications;  Discussed plans with patient for ongoing care management follow up and provided patient with direct contact information for care management team; Advised patient, providing education and rationale, to monitor blood pressure daily and record, calling PCP for findings outside established parameters;  Reviewed scheduled/upcoming provider appointments including: 01-13-2023 at 3 pm. Knows to call for changes or needs  Advised patient to discuss changes in her blood pressures, parameters of when to hold her medications and other questions or concerns about heart healthy with provider; Provided education on prescribed diet heart healthy;  Discussed complications of poorly controlled blood pressure such as heart disease, stroke, circulatory complications, vision complications, kidney impairment, sexual dysfunction;  Screening for signs and symptoms of depression related to chronic disease state;  Assessed social determinant of health barriers;    Symptom Management: Take medications as prescribed   Attend all scheduled provider appointments Call pharmacy for medication refills 3-7 days in advance of running out of medications Call provider office for new concerns or questions  call the Suicide and Crisis Lifeline: 988 call the [place] National Suicide Prevention Lifeline: 309-803-3317 or TTY: 959-841-5497 TTY 361-626-4821) to talk to a trained counselor call 1-800-273-TALK (toll free, 24 hour hotline) if experiencing a Mental Health or  Behavioral Health Crisis  check blood pressure 3 times per week learn about high blood pressure keep a blood pressure log take blood pressure log to all doctor appointments call doctor for signs and symptoms of high blood pressure develop an action plan for high blood pressure keep all doctor appointments take medications for blood pressure exactly as prescribed report new symptoms to your doctor  Follow Up Plan: Telephone follow up appointment with care management team member scheduled for: 03-04-2023 at 1 pm       RNM Care Management Expected Outcome:  Monitor, Self-Manage and Reduce Symptoms of: GERD       Current Barriers:  Knowledge Deficits related to how to control triggers that cause exacerbation of GERD Care Coordination needs related to resources and ongoing education needs  in a patient with GERD Chronic Disease Management support and education needs related to Effective management of GERD Lacks caregiver support.   Planned Interventions: Evaluation of current treatment plan related to GERD and patient's adherence to plan as established by provider. The patient states her GERD and GI sx and sx are stable. Advised patient to call the office for changes in her sx and sx of GERD Provided education to patient re: foods to avoid that will cause exacerbation, monitoring for changes, keeping stress levels down, and notifying the provider for changes in her condition. Review of dietary habits. The patient is eating fruits and vegetables. Discussed hydration and monitoring for acute changes in dietary habits that may cause constipation.  Reviewed medications with patient and discussed compliance, the patient states compliance with medications. Has had an increase in her Protonix to  BID and this is working well for her at this time.  Reviewed scheduled/upcoming provider appointments including 01-13-2023 at 3 pm, lab work drawn this morning Discussed plans with patient for ongoing care  management follow up and provided patient with direct contact information for care management team Advised patient to discuss changes in her GERD, questions or concerns with provider Screening for signs and symptoms of depression related to chronic disease state  Assessed social determinant of health barriers  Symptom Management: Take medications as prescribed   Attend all scheduled provider appointments Call provider office for new concerns or questions  call the Suicide and Crisis Lifeline: 988 call the [place] National Suicide Prevention Lifeline: (984)501-5950 or TTY: 412-710-5496 TTY (716)161-4976) to talk to a trained counselor call 1-800-273-TALK (toll free, 24 hour hotline) if experiencing a Mental Health or Behavioral Health Crisis   Follow Up Plan: Telephone follow up appointment with care management team member scheduled for: 03-04-2023  at 1 pm             Consent to Services:  Patient was given information about care management services, agreed to services, and gave verbal consent to participate.   Plan: Telephone follow up appointment with care management team member scheduled for: 03-04-2023 at 1:00 pm  Danise Edge, BSN RN RN Care Manager  [facility]  Ambulatory Care Management  Direct Number: [phone]

## 2023-01-13 ENCOUNTER — Ambulatory Visit (INDEPENDENT_AMBULATORY_CARE_PROVIDER_SITE_OTHER): Payer: Medicare HMO | Admitting: Internal Medicine

## 2023-01-13 ENCOUNTER — Encounter: Payer: Self-pay | Admitting: Internal Medicine

## 2023-01-13 VITALS — BP 100/66 | HR 92 | Ht 63.0 in | Wt 154.0 lb

## 2023-01-13 DIAGNOSIS — E663 Overweight: Secondary | ICD-10-CM

## 2023-01-13 DIAGNOSIS — E7800 Pure hypercholesterolemia, unspecified: Secondary | ICD-10-CM | POA: Diagnosis not present

## 2023-01-13 DIAGNOSIS — Z1211 Encounter for screening for malignant neoplasm of colon: Secondary | ICD-10-CM

## 2023-01-13 DIAGNOSIS — R739 Hyperglycemia, unspecified: Secondary | ICD-10-CM

## 2023-01-13 DIAGNOSIS — Z6827 Body mass index (BMI) 27.0-27.9, adult: Secondary | ICD-10-CM | POA: Diagnosis not present

## 2023-01-13 DIAGNOSIS — Z0001 Encounter for general adult medical examination with abnormal findings: Secondary | ICD-10-CM

## 2023-01-13 NOTE — Progress Notes (Signed)
Subjective:    Patient ID: Katrina Sutton, female    DOB: [birth year], 72 y.o.   MRN: [id number]  HPI  Patient presents to clinic today for her annual exam.  Flu: Never Tetanus: 09/2015 COVID: Never Pneumovax: Never Prevnar: Never Shingrix: Never Pap smear: >5 years ago Mammogram: > 2 years ago Bone density: Never Colon screening: Never Vision screening: as needed Dentist: as needed  Diet: She does eat some meat. She consumes fruits and veggies. She tries to avoid fried foods. She drinks mostly water, tea. Exercise: None   Review of Systems     Past Medical History:  Diagnosis Date   Allergy    seasonal   Anxiety    Arthritis    osteoarthritis   Depression    GERD (gastroesophageal reflux disease)    Hiatal hernia     Current Outpatient Medications  Medication Sig Dispense Refill   acetaminophen (TYLENOL) 500 MG tablet Take 500 mg by mouth every 8 (eight) hours as needed for headache.     aspirin 81 MG EC tablet Take 1 tablet (81 mg total) by mouth daily. Swallow whole. 90 tablet 1   clonazePAM (KLONOPIN) 0.5 MG tablet TAKE 1 TABLET BY MOUTH 3 TIMES A DAY AS NEEDED 90 tablet 0   hydrocortisone cream 1 % Apply 1 application topically 2 (two) times daily as needed for itching.      pantoprazole (PROTONIX) 40 MG tablet TAKE 1 TABLET BY MOUTH TWICE A DAY 180 tablet 0   PARoxetine (PAXIL) 40 MG tablet TAKE 1 TABLET BY MOUTH DAILY 90 tablet 1   polyethylene glycol (MIRALAX / GLYCOLAX) 17 g packet Take 17 g by mouth daily.     simvastatin (ZOCOR) 10 MG tablet Take 1 tablet (10 mg total) by mouth at bedtime. 90 tablet 1   No current facility-administered medications for this visit.    Allergies  Allergen Reactions   Erythromycin Shortness Of Breath and Rash   Duloxetine Palpitations    Family History  Problem Relation Age of Onset   Mental illness Mother    Ulcers Father    Stroke Neg Hx    Heart attack Neg Hx    Cancer Neg Hx     Social History    Socioeconomic History   Marital status: Married    Spouse name: Alysandra Losi   Number of children: 1   Years of education: Not on file   Highest education level: Not on file  Occupational History   Occupation: retired  Tobacco Use   Smoking status: Former    Current packs/day: 0.00    Types: Cigarettes    Quit date: 12/27/1976    Years since quitting: 46.0   Smokeless tobacco: Never  Vaping Use   Vaping status: Never Used  Substance and Sexual Activity   Alcohol use: No   Drug use: No   Sexual activity: Not Currently    Birth control/protection: None  Other Topics Concern   Not on file  Social History Narrative   Not on file   Social Determinants of Health   Financial Resource Strain: Low Risk  (11/23/2022)   Overall Financial Resource Strain (CARDIA)    Difficulty of Paying Living Expenses: Not very hard  Food Insecurity: No Food Insecurity (11/23/2022)   Hunger Vital Sign    Worried About Running Out of Food in the Last Year: Never true    Ran Out of Food in the Last Year: Never true  Transportation Needs: No  Transportation Needs (11/23/2022)   PRAPARE - Administrator, Civil Service (Medical): No    Lack of Transportation (Non-Medical): No  Physical Activity: Insufficiently Active (11/23/2022)   Exercise Vital Sign    Days of Exercise per Week: 4 days    Minutes of Exercise per Session: 20 min  Stress: No Stress Concern Present (11/23/2022)   [employer] of Occupational Health - Occupational Stress Questionnaire    Feeling of Stress : Only a little  Social Connections: Moderately Isolated (11/23/2022)   Social Connection and Isolation Panel [NHANES]    Frequency of Communication with Friends and Family: Three times a week    Frequency of Social Gatherings with Friends and Family: Never    Attends Religious Services: Never    Database administrator or Organizations: No    Attends Banker Meetings: Never    Marital Status: Married   Catering manager Violence: Not At Risk (11/23/2022)   Humiliation, Afraid, Rape, and Kick questionnaire    Fear of Current or Ex-Partner: No    Emotionally Abused: No    Physically Abused: No    Sexually Abused: No     Constitutional: Denies fever, malaise, fatigue, headache or abrupt weight changes.  HEENT: Denies eye pain, eye redness, ear pain, ringing in the ears, wax buildup, runny nose, nasal congestion, bloody nose, or sore throat. Respiratory: Denies difficulty breathing, shortness of breath, cough or sputum production.   Cardiovascular: Denies chest pain, chest tightness, palpitations or swelling in the hands or feet.  Gastrointestinal: Patient reports constipation.  Denies abdominal pain, bloating, diarrhea or blood in the stool.  GU: Denies urgency, frequency, pain with urination, burning sensation, blood in urine, odor or discharge. Musculoskeletal: Patient reports joint pain.  Denies decrease in range of motion, difficulty with gait, muscle pain or joint swelling.  Skin: Denies redness, rashes, lesions or ulcercations.  Neurological: Denies dizziness, difficulty with memory, difficulty with speech or problems with balance and coordination.  Psych: Patient has a history of anxiety and depression.  Denies SI/HI.  No other specific complaints in a complete review of systems (except as listed in HPI above).  Objective:   Physical Exam  BP 100/66   Pulse 92   Ht 5\' 3"  (1.6 m)   Wt 154 lb (69.9 kg)   SpO2 95%   BMI 27.28 kg/m   Wt Readings from Last 3 Encounters:  07/08/22 155 lb (70.3 kg)  06/24/22 155 lb (70.3 kg)  01/22/22 153 lb (69.4 kg)    General: Appears her stated age, overweight, in NAD. Skin: Warm, dry and intact.  HEENT: Head: normal shape and size; Eyes: sclera white, no icterus, conjunctiva pink, PERRLA and EOMs intact;  Neck:  Neck supple, trachea midline. No masses, lumps or thyromegaly present.  Cardiovascular: Normal rate and rhythm. S1,S2 noted.   No murmur, rubs or gallops noted. No JVD or BLE edema. No carotid bruits noted. Pulmonary/Chest: Normal effort and positive vesicular breath sounds. No respiratory distress. No wheezes, rales or ronchi noted.  Abdomen: Soft and nontender. Normal bowel sounds.  Musculoskeletal: Strength 5/5BUE/BLE. No difficulty with gait.  Neurological: Alert and oriented. Cranial nerves II-XII grossly intact. Coordination normal.  Psychiatric: Mood and affect normal. Behavior is normal. Judgment and thought content normal.    BMET    Component Value Date/Time   NA 140 06/24/2022 1316   NA 142 10/11/2012 2133   K 4.2 06/24/2022 1316   K 3.5 10/11/2012 2133   CL  102 06/24/2022 1316   CL 107 10/11/2012 2133   CO2 28 06/24/2022 1316   CO2 26 10/11/2012 2133   GLUCOSE 106 (H) 06/24/2022 1316   GLUCOSE 105 (H) 10/11/2012 2133   BUN 10 06/24/2022 1316   BUN 12 10/11/2012 2133   CREATININE 1.15 (H) 06/24/2022 1316   CALCIUM 9.7 06/24/2022 1316   CALCIUM 9.7 10/11/2012 2133   GFRNONAA >60 11/01/2019 0431   GFRNONAA 62 10/25/2019 1003   GFRAA >60 11/01/2019 0431   GFRAA 72 10/25/2019 1003    Lipid Panel     Component Value Date/Time   CHOL 202 (H) 06/24/2022 1316   TRIG 118 06/24/2022 1316   HDL 57 06/24/2022 1316   CHOLHDL 3.5 06/24/2022 1316   LDLCALC 122 (H) 06/24/2022 1316    CBC    Component Value Date/Time   WBC 9.9 06/24/2022 1316   RBC 4.93 06/24/2022 1316   HGB 14.7 06/24/2022 1316   HGB 14.9 10/11/2012 2133   HCT 43.5 06/24/2022 1316   HCT 41.7 10/11/2012 2133   PLT 334 06/24/2022 1316   PLT 249 10/11/2012 2133   MCV 88.2 06/24/2022 1316   MCV 87 10/11/2012 2133   MCH 29.8 06/24/2022 1316   MCHC 33.8 06/24/2022 1316   RDW 13.3 06/24/2022 1316   RDW 13.6 10/11/2012 2133   LYMPHSABS 1.4 10/29/2019 0548   LYMPHSABS 2.0 06/28/2011 1412   MONOABS 0.9 10/29/2019 0548   MONOABS 0.7 06/28/2011 1412   EOSABS 0.1 10/29/2019 0548   EOSABS 0.0 06/28/2011 1412   BASOSABS 0.0  10/29/2019 0548   BASOSABS 0.0 06/28/2011 1412    Hgb A1C Lab Results  Component Value Date   HGBA1C 5.7 (H) 06/24/2022            Assessment & Plan:   Preventative health maintenance:  Flu shot declined Tetanus UTD Encouraged her to get her COVID-vaccine She declines Pneumovax or Prevnar at this time Discussed Shingrix vaccine, she will check coverage with her insurance company and schedule visit if she would like to have this done She no longer wants to screen for cervical cancer She declines mammogram She declines bone density Cologuard ordered Encouraged her to consume a balanced diet and exercise regimen Advised her to see an eye doctor and dentist annually We will check CBC, c-Met, lipid, A1c today  RTC in 6 months, follow-up chronic conditions Nicki Reaper, NP

## 2023-01-13 NOTE — Patient Instructions (Signed)
Health Maintenance for Postmenopausal Women Menopause is a normal process in which your ability to get pregnant comes to an end. This process happens slowly over many months or years, usually between the ages of 48 and 55. Menopause is complete when you have missed your menstrual period for 12 months. It is important to talk with your health care provider about some of the most common conditions that affect women after menopause (postmenopausal women). These include heart disease, cancer, and bone loss (osteoporosis). Adopting a healthy lifestyle and getting preventive care can help to promote your health and wellness. The actions you take can also lower your chances of developing some of these common conditions. What are the signs and symptoms of menopause? During menopause, you may have the following symptoms: Hot flashes. These can be moderate or severe. Night sweats. Decrease in sex drive. Mood swings. Headaches. Tiredness (fatigue). Irritability. Memory problems. Problems falling asleep or staying asleep. Talk with your health care provider about treatment options for your symptoms. Do I need hormone replacement therapy? Hormone replacement therapy is effective in treating symptoms that are caused by menopause, such as hot flashes and night sweats. Hormone replacement carries certain risks, especially as you become older. If you are thinking about using estrogen or estrogen with progestin, discuss the benefits and risks with your health care provider. How can I reduce my risk for heart disease and stroke? The risk of heart disease, heart attack, and stroke increases as you age. One of the causes may be a change in the body's hormones during menopause. This can affect how your body uses dietary fats, triglycerides, and cholesterol. Heart attack and stroke are medical emergencies. There are many things that you can do to help prevent heart disease and stroke. Watch your blood pressure High  blood pressure causes heart disease and increases the risk of stroke. This is more likely to develop in people who have high blood pressure readings or are overweight. Have your blood pressure checked: Every 3-5 years if you are 18-39 years of age. Every year if you are 40 years old or older. Eat a healthy diet  Eat a diet that includes plenty of vegetables, fruits, low-fat dairy products, and lean protein. Do not eat a lot of foods that are high in solid fats, added sugars, or sodium. Get regular exercise Get regular exercise. This is one of the most important things you can do for your health. Most adults should: Try to exercise for at least 150 minutes each week. The exercise should increase your heart rate and make you sweat (moderate-intensity exercise). Try to do strengthening exercises at least twice each week. Do these in addition to the moderate-intensity exercise. Spend less time sitting. Even light physical activity can be beneficial. Other tips Work with your health care provider to achieve or maintain a healthy weight. Do not use any products that contain nicotine or tobacco. These products include cigarettes, chewing tobacco, and vaping devices, such as e-cigarettes. If you need help quitting, ask your health care provider. Know your numbers. Ask your health care provider to check your cholesterol and your blood sugar (glucose). Continue to have your blood tested as directed by your health care provider. Do I need screening for cancer? Depending on your health history and family history, you may need to have cancer screenings at different stages of your life. This may include screening for: Breast cancer. Cervical cancer. Lung cancer. Colorectal cancer. What is my risk for osteoporosis? After menopause, you may be   at increased risk for osteoporosis. Osteoporosis is a condition in which bone destruction happens more quickly than new bone creation. To help prevent osteoporosis or  the bone fractures that can happen because of osteoporosis, you may take the following actions: If you are 19-50 years old, get at least 1,000 mg of calcium and at least 600 international units (IU) of vitamin D per day. If you are older than age 50 but younger than age 70, get at least 1,200 mg of calcium and at least 600 international units (IU) of vitamin D per day. If you are older than age 70, get at least 1,200 mg of calcium and at least 800 international units (IU) of vitamin D per day. Smoking and drinking excessive alcohol increase the risk of osteoporosis. Eat foods that are rich in calcium and vitamin D, and do weight-bearing exercises several times each week as directed by your health care provider. How does menopause affect my mental health? Depression may occur at any age, but it is more common as you become older. Common symptoms of depression include: Feeling depressed. Changes in sleep patterns. Changes in appetite or eating patterns. Feeling an overall lack of motivation or enjoyment of activities that you previously enjoyed. Frequent crying spells. Talk with your health care provider if you think that you are experiencing any of these symptoms. General instructions See your health care provider for regular wellness exams and vaccines. This may include: Scheduling regular health, dental, and eye exams. Getting and maintaining your vaccines. These include: Influenza vaccine. Get this vaccine each year before the flu season begins. Pneumonia vaccine. Shingles vaccine. Tetanus, diphtheria, and pertussis (Tdap) booster vaccine. Your health care provider may also recommend other immunizations. Tell your health care provider if you have ever been abused or do not feel safe at home. Summary Menopause is a normal process in which your ability to get pregnant comes to an end. This condition causes hot flashes, night sweats, decreased interest in sex, mood swings, headaches, or lack  of sleep. Treatment for this condition may include hormone replacement therapy. Take actions to keep yourself healthy, including exercising regularly, eating a healthy diet, watching your weight, and checking your blood pressure and blood sugar levels. Get screened for cancer and depression. Make sure that you are up to date with all your vaccines. This information is not intended to replace advice given to you by your health care provider. Make sure you discuss any questions you have with your health care provider. Document Revised: 07/21/2020 Document Reviewed: 07/21/2020 Elsevier Patient Education  2024 Elsevier Inc.  

## 2023-01-13 NOTE — Assessment & Plan Note (Signed)
Encouraged diet and exercise for weight loss ?

## 2023-01-14 ENCOUNTER — Telehealth: Payer: Self-pay

## 2023-01-14 LAB — CBC
HCT: 45.4 % — ABNORMAL HIGH (ref 35.0–45.0)
Hemoglobin: 15.1 g/dL (ref 11.7–15.5)
MCH: 29.3 pg (ref 27.0–33.0)
MCHC: 33.3 g/dL (ref 32.0–36.0)
MCV: 88 fL (ref 80.0–100.0)
MPV: 9.4 fL (ref 7.5–12.5)
Platelets: 299 10*3/uL (ref 140–400)
RBC: 5.16 10*6/uL — ABNORMAL HIGH (ref 3.80–5.10)
RDW: 13.4 % (ref 11.0–15.0)
WBC: 7.8 10*3/uL (ref 3.8–10.8)

## 2023-01-14 LAB — COMPLETE METABOLIC PANEL WITH GFR
AG Ratio: 1.6 (calc) (ref 1.0–2.5)
ALT: 11 U/L (ref 6–29)
AST: 17 U/L (ref 10–35)
Albumin: 4.3 g/dL (ref 3.6–5.1)
Alkaline phosphatase (APISO): 121 U/L (ref 37–153)
BUN/Creatinine Ratio: 11 (calc) (ref 6–22)
BUN: 12 mg/dL (ref 7–25)
CO2: 30 mmol/L (ref 20–32)
Calcium: 9.5 mg/dL (ref 8.6–10.4)
Chloride: 101 mmol/L (ref 98–110)
Creat: 1.11 mg/dL — ABNORMAL HIGH (ref 0.60–1.00)
Globulin: 2.7 g/dL (ref 1.9–3.7)
Glucose, Bld: 91 mg/dL (ref 65–99)
Potassium: 4.4 mmol/L (ref 3.5–5.3)
Sodium: 139 mmol/L (ref 135–146)
Total Bilirubin: 1.3 mg/dL — ABNORMAL HIGH (ref 0.2–1.2)
Total Protein: 7 g/dL (ref 6.1–8.1)
eGFR: 52 mL/min/{1.73_m2} — ABNORMAL LOW (ref >=60)

## 2023-01-14 LAB — HEMOGLOBIN A1C
Hgb A1c MFr Bld: 5.8 %{Hb} — ABNORMAL HIGH (ref <5.7)
Mean Plasma Glucose: 120 mg/dL
eAG (mmol/L): 6.6 mmol/L

## 2023-01-14 LAB — LIPID PANEL
Cholesterol: 203 mg/dL — ABNORMAL HIGH (ref <200)
HDL: 61 mg/dL (ref >=50)
LDL Cholesterol (Calc): 120 mg/dL — ABNORMAL HIGH
Non-HDL Cholesterol (Calc): 142 mg/dL — ABNORMAL HIGH (ref <130)
Total CHOL/HDL Ratio: 3.3 (calc) (ref <5.0)
Triglycerides: 108 mg/dL (ref <150)

## 2023-01-14 MED ORDER — ATORVASTATIN CALCIUM 20 MG PO TABS: 20.0000 mg | ORAL_TABLET | Freq: Every day | ORAL | 1 refills | Status: DC

## 2023-01-14 NOTE — Telephone Encounter (Signed)
Medication sent to pharmacy  

## 2023-01-14 NOTE — Telephone Encounter (Signed)
Left message for patient to call back to discuss results and recommendations. °

## 2023-01-14 NOTE — Telephone Encounter (Signed)
-----   Message from Judson sent at 01/14/2023 12:43 PM EDT ----- Hematocrit is marginally elevated but in the setting of a normal hemoglobin, this is not concerning but something we will monitor.  Blood counts otherwise normal.  Kidney function slightly decreased but stable.  Liver function is normal.  Cholesterol remains elevated despite simvastatin.  Would she be agreeable to changing this to atorvastatin 20 mg?  A1c is 5.8%, still within the prediabetic range.  She should try to decrease her carbohydrate and sugar intake.

## 2023-01-14 NOTE — Telephone Encounter (Signed)
Pt given lab results per notes of PCP on 01/14/23. Pt verbalized understanding. Pt is willing to change cholesterol med to atorvastatin.

## 2023-01-14 NOTE — Addendum Note (Signed)
Addended by: Lorre Munroe on: 01/14/2023 03:53 PM   Modules accepted: Orders

## 2023-01-14 NOTE — Telephone Encounter (Signed)
This encounter was created in error - please disregard.

## 2023-01-18 NOTE — Progress Notes (Signed)
Patient aware of results.

## 2023-01-19 ENCOUNTER — Other Ambulatory Visit: Payer: Self-pay | Admitting: Internal Medicine

## 2023-01-19 DIAGNOSIS — F419 Anxiety disorder, unspecified: Secondary | ICD-10-CM

## 2023-01-20 NOTE — Telephone Encounter (Signed)
Requested Prescriptions  Pending Prescriptions Disp Refills   PARoxetine (PAXIL) 40 MG tablet [Pharmacy Med Name: PAROXETINE HCL 40 MG TAB] 90 tablet 1    Sig: TAKE 1 TABLET BY MOUTH DAILY     Psychiatry:  Antidepressants - SSRI Passed - 01/19/2023 10:15 AM      Passed - Valid encounter within last 6 months    Recent Outpatient Visits           1 week ago Encounter for general adult medical examination with abnormal findings   [place] [facility] [place], Salvadore Oxford, NP   7 months ago Aortic atherosclerosis [facility])   [place] [facility] [place], Salvadore Oxford, NP   12 months ago Primary hypertension   [place] [facility] [place], Salvadore Oxford, NP   1 year ago Encounter for general adult medical examination with abnormal findings   [place] [facility] [place], Salvadore Oxford, NP   1 year ago Anxiety   [place] [facility] [place], Jackelyn Poling, RPH-CPP       Future Appointments             In 5 months Baity, Salvadore Oxford, NP [place] [facility], [facility]

## 2023-01-24 ENCOUNTER — Other Ambulatory Visit: Payer: Self-pay | Admitting: Internal Medicine

## 2023-01-25 NOTE — Telephone Encounter (Signed)
Requested medication (s) are due for refill today - yes  Requested medication (s) are on the active medication list -yes  Future visit scheduled -yes  Last refill: 12/28/22 #90  Notes to clinic: non delegated Rx  Requested Prescriptions  Pending Prescriptions Disp Refills   clonazePAM (KLONOPIN) 0.5 MG tablet [Pharmacy Med Name: CLONAZEPAM 0.5 MG TAB] 90 tablet     Sig: TAKE 1 TABLET BY MOUTH 3 TIMES A DAY AS NEEDED     Not Delegated - Psychiatry: Anxiolytics/Hypnotics 2 Failed - 01/24/2023  9:53 AM      Failed - This refill cannot be delegated      Failed - Urine Drug Screen completed in last 360 days      Passed - Patient is not pregnant      Passed - Valid encounter within last 6 months    Recent Outpatient Visits           1 week ago Encounter for general adult medical examination with abnormal findings   [place] [facility] [place], Salvadore Oxford, NP   7 months ago Aortic atherosclerosis [facility])   [place] [facility] [place], Salvadore Oxford, NP   1 year ago Primary hypertension   [place] [facility] [place], Salvadore Oxford, NP   1 year ago Encounter for general adult medical examination with abnormal findings   [facility] [place], Salvadore Oxford, NP   1 year ago Anxiety   [place] [facility] [place], Jackelyn Poling, RPH-CPP       Future Appointments             In 5 months Baity, Salvadore Oxford, NP [place] [facility], [facility]               Requested Prescriptions  Pending Prescriptions Disp Refills   clonazePAM (KLONOPIN) 0.5 MG tablet [Pharmacy Med Name: CLONAZEPAM 0.5 MG TAB] 90 tablet     Sig: TAKE 1 TABLET BY MOUTH 3 TIMES A DAY AS NEEDED     Not Delegated - Psychiatry: Anxiolytics/Hypnotics 2 Failed - 01/24/2023  9:53 AM      Failed - This refill cannot be delegated      Failed - Urine Drug Screen completed in last 360 days      Passed - Patient is not pregnant       Passed - Valid encounter within last 6 months    Recent Outpatient Visits           1 week ago Encounter for general adult medical examination with abnormal findings   [place] [facility] [place], Salvadore Oxford, NP   7 months ago Aortic atherosclerosis [facility])   [place] [facility] [place], Salvadore Oxford, NP   1 year ago Primary hypertension   [place] [facility] [place], Salvadore Oxford, NP   1 year ago Encounter for general adult medical examination with abnormal findings   [place] [facility] [place], Salvadore Oxford, NP   1 year ago Anxiety   [place] [facility] [place], Jackelyn Poling, RPH-CPP       Future Appointments             In 5 months Baity, Salvadore Oxford, NP  [facility], [facility]

## 2023-02-21 ENCOUNTER — Other Ambulatory Visit: Payer: Self-pay | Admitting: Internal Medicine

## 2023-02-22 NOTE — Telephone Encounter (Signed)
Requested medication (s) are due for refill today: Yes  Requested medication (s) are on the active medication list: Yes  Last refill:  01/26/23  Future visit scheduled: Yes  Notes to clinic:  Unable to refill per protocol, cannot delegate.      Requested Prescriptions  Pending Prescriptions Disp Refills   clonazePAM (KLONOPIN) 0.5 MG tablet [Pharmacy Med Name: CLONAZEPAM 0.5 MG TAB] 90 tablet     Sig: TAKE 1 TABLET BY MOUTH 3 TIMES A DAY AS NEEDED     Not Delegated - Psychiatry: Anxiolytics/Hypnotics 2 Failed - 02/21/2023 10:37 AM      Failed - This refill cannot be delegated      Failed - Urine Drug Screen completed in last 360 days      Passed - Patient is not pregnant      Passed - Valid encounter within last 6 months    Recent Outpatient Visits           1 month ago Encounter for general adult medical examination with abnormal findings   [place] [facility] [place], Salvadore Oxford, NP   8 months ago Aortic atherosclerosis [facility])   [place] [facility] [place], Salvadore Oxford, NP   1 year ago Primary hypertension   [place] [facility] [place], Salvadore Oxford, NP   1 year ago Encounter for general adult medical examination with abnormal findings   [place] [facility] [place], Salvadore Oxford, NP   1 year ago Anxiety   [place] [facility] [place], Jackelyn Poling, RPH-CPP       Future Appointments             In 4 months Baity, Salvadore Oxford, NP [place] [facility], [facility]

## 2023-03-04 ENCOUNTER — Telehealth: Payer: Self-pay

## 2023-03-04 ENCOUNTER — Other Ambulatory Visit: Payer: Self-pay

## 2023-03-04 NOTE — Patient Outreach (Signed)
  Care Management   Outreach Note  03/04/2023 Name: Katrina Sutton MRN: [id number] DOB: [birth year]  An unsuccessful outreach attempt was made today for a scheduled Care Management visit.    Follow Up Plan:  A HIPAA compliant phone message was left for the patient providing contact information and requesting a return call.    [facility]  [facility], [facility] RN Care Manager Direct Dial: [phone] Website: [email or web address]

## 2023-03-18 ENCOUNTER — Other Ambulatory Visit: Payer: Self-pay | Admitting: Internal Medicine

## 2023-03-21 NOTE — Telephone Encounter (Signed)
 Requested medication (s) are due for refill today - yes  Requested medication (s) are on the active medication list -yes  Future visit scheduled -yes  Last refill: 02/23/23 #90  Notes to clinic: non delegated Rx  Requested Prescriptions  Pending Prescriptions Disp Refills   clonazePAM  (KLONOPIN ) 0.5 MG tablet [Pharmacy Med Name: CLONAZEPAM  0.5 MG TAB] 90 tablet     Sig: TAKE ONE TABLET BY THREE TIMES A DAY AS NEEDED     Not Delegated - Psychiatry: Anxiolytics/Hypnotics 2 Failed - 03/21/2023  9:29 AM      Failed - This refill cannot be delegated      Failed - Urine Drug Screen completed in last 360 days      Passed - Patient is not pregnant      Passed - Valid encounter within last 6 months    Recent Outpatient Visits           2 months ago Encounter for general adult medical examination with abnormal findings   [place] [facility] [place], Angeline ORN, NP   9 months ago Aortic atherosclerosis [facility])   [place] [facility] [place], Angeline ORN, NP   1 year ago Primary hypertension   [place] [facility] [place], Angeline ORN, NP   1 year ago Encounter for general adult medical examination with abnormal findings   [facility] [place], Angeline ORN, NP   1 year ago Anxiety   [place] [facility] Delles, Sharyle LABOR, RPH-CPP       Future Appointments             In 3 months Baity, Angeline ORN, NP [place] [facility], [facility]               Requested Prescriptions  Pending Prescriptions Disp Refills   clonazePAM  (KLONOPIN ) 0.5 MG tablet [Pharmacy Med Name: CLONAZEPAM  0.5 MG TAB] 90 tablet     Sig: TAKE ONE TABLET BY THREE TIMES A DAY AS NEEDED     Not Delegated - Psychiatry: Anxiolytics/Hypnotics 2 Failed - 03/21/2023  9:29 AM      Failed - This refill cannot be delegated      Failed - Urine Drug Screen completed in last 360 days      Passed - Patient is not pregnant       Passed - Valid encounter within last 6 months    Recent Outpatient Visits           2 months ago Encounter for general adult medical examination with abnormal findings   [facility] [place], Angeline ORN, NP   9 months ago Aortic atherosclerosis [facility])   [place] [facility] [place], Angeline ORN, NP   1 year ago Primary hypertension   [place] [facility] [place], Angeline ORN, NP   1 year ago Encounter for general adult medical examination with abnormal findings   [place] [facility] [place], Angeline ORN, NP   1 year ago Anxiety   [place] [facility] Delles, Sharyle LABOR, RPH-CPP       Future Appointments             In 3 months Baity, Angeline ORN, NP  [facility], [facility]

## 2023-03-25 ENCOUNTER — Other Ambulatory Visit: Payer: Self-pay | Admitting: Internal Medicine

## 2023-03-28 NOTE — Telephone Encounter (Signed)
 Requested Prescriptions  Pending Prescriptions Disp Refills   pantoprazole  (PROTONIX ) 40 MG tablet [Pharmacy Med Name: PANTOPRAZOLE  SODIUM 40 MG DR TAB] 180 tablet 0    Sig: TAKE 1 TABLET BY MOUTH TWICE A DAY     Gastroenterology: Proton Pump Inhibitors Passed - 03/28/2023 11:42 AM      Passed - Valid encounter within last 12 months    Recent Outpatient Visits           2 months ago Encounter for general adult medical examination with abnormal findings   [place] [facility] [place], Angeline ORN, NP   9 months ago Aortic atherosclerosis [facility])   [place] [facility] [place], Angeline ORN, NP   1 year ago Primary hypertension   [place] [facility] [place], Angeline ORN, NP   1 year ago Encounter for general adult medical examination with abnormal findings   [place] [facility] [place], Angeline ORN, NP   1 year ago Anxiety   [place] [facility] Delles, Sharyle LABOR, RPH-CPP       Future Appointments             In 3 months Baity, Angeline ORN, NP [place] [facility], [facility]

## 2023-04-13 ENCOUNTER — Other Ambulatory Visit: Payer: Self-pay | Admitting: Internal Medicine

## 2023-04-14 NOTE — Telephone Encounter (Signed)
Requested medication (s) are due for refill today: yes  Requested medication (s) are on the active medication list: yes  Last refill:  03/21/23  Future visit scheduled: yes  Notes to clinic:  Unable to refill per protocol, cannot delegate.      Requested Prescriptions  Pending Prescriptions Disp Refills   clonazePAM (KLONOPIN) 0.5 MG tablet [Pharmacy Med Name: CLONAZEPAM 0.5 MG TAB] 90 tablet     Sig: TAKE 1 TABLET BY MOUTH 3 TIMES DAILY AS NEEDED     Not Delegated - Psychiatry: Anxiolytics/Hypnotics 2 Failed - 04/14/2023  3:31 PM      Failed - This refill cannot be delegated      Failed - Urine Drug Screen completed in last 360 days      Passed - Patient is not pregnant      Passed - Valid encounter within last 6 months    Recent Outpatient Visits           3 months ago Encounter for general adult medical examination with abnormal findings   [place] [place], Salvadore Oxford, NP   9 months ago Aortic atherosclerosis [facility])   [place] [facility] [place], Salvadore Oxford, NP   1 year ago Primary hypertension   [facility] [place], Salvadore Oxford, NP   1 year ago Encounter for general adult medical examination with abnormal findings   [place] [facility] [place], Salvadore Oxford, NP   1 year ago Anxiety   [place] [facility] Delles, Jackelyn Poling, RPH-CPP       Future Appointments             In 2 months Baity, Salvadore Oxford, NP [place] [facility], [facility]

## 2023-05-10 ENCOUNTER — Other Ambulatory Visit: Payer: Self-pay | Admitting: Internal Medicine

## 2023-05-10 NOTE — Telephone Encounter (Signed)
 Requested medication (s) are due for refill today: Yes  Requested medication (s) are on the active medication list: Yes  Last refill:  04/15/23  Future visit scheduled: Yes  Notes to clinic:  Unable to refill per protocol, cannot delegate.      Requested Prescriptions  Pending Prescriptions Disp Refills   clonazePAM (KLONOPIN) 0.5 MG tablet [Pharmacy Med Name: CLONAZEPAM 0.5 MG TAB] 90 tablet     Sig: TAKE 1 TABLET BY MOUTH 3 TIMES DAILY AS NEEDED     Not Delegated - Psychiatry: Anxiolytics/Hypnotics 2 Failed - 05/10/2023  5:22 PM      Failed - This refill cannot be delegated      Failed - Urine Drug Screen completed in last 360 days      Passed - Patient is not pregnant      Passed - Valid encounter within last 6 months    Recent Outpatient Visits           3 months ago Encounter for general adult medical examination with abnormal findings   [place] [facility] [place], Salvadore Oxford, NP   10 months ago Aortic atherosclerosis [facility])   [place] [facility] [place], Salvadore Oxford, NP   1 year ago Primary hypertension   [place] [facility] [place], Salvadore Oxford, NP   1 year ago Encounter for general adult medical examination with abnormal findings   [place] [facility] [place], Salvadore Oxford, NP   1 year ago Anxiety   [place] [facility] Delles, Jackelyn Poling, RPH-CPP       Future Appointments             In 2 months Baity, Salvadore Oxford, NP [place] [facility], [facility]

## 2023-06-09 ENCOUNTER — Other Ambulatory Visit: Payer: Self-pay | Admitting: Internal Medicine

## 2023-06-09 NOTE — Telephone Encounter (Signed)
 Requested medication (s) are due for refill today: Yes  Requested medication (s) are on the active medication list: Yes  Last refill:  05/11/23  Future visit scheduled: Yes  Notes to clinic:  Unable to refill per protocol, cannot delegate.      Requested Prescriptions  Pending Prescriptions Disp Refills   clonazePAM (KLONOPIN) 0.5 MG tablet [Pharmacy Med Name: CLONAZEPAM 0.5 MG TAB] 90 tablet     Sig: TAKE 1 TABLET BY MOUTH 3 TIMES DAILY AS NEEDED     Not Delegated - Psychiatry: Anxiolytics/Hypnotics 2 Failed - 06/09/2023  2:12 PM      Failed - This refill cannot be delegated      Failed - Urine Drug Screen completed in last 360 days      Failed - Valid encounter within last 6 months    Recent Outpatient Visits   None     Future Appointments             In 1 month Baity, Salvadore Oxford, NP [place] [facility], [facility]            Passed - Patient is not pregnant

## 2023-06-20 ENCOUNTER — Other Ambulatory Visit: Payer: Self-pay | Admitting: Internal Medicine

## 2023-06-21 ENCOUNTER — Other Ambulatory Visit: Payer: Self-pay | Admitting: Internal Medicine

## 2023-06-21 NOTE — Telephone Encounter (Signed)
 Requested Prescriptions  Pending Prescriptions Disp Refills   pantoprazole (PROTONIX) 40 MG tablet [Pharmacy Med Name: PANTOPRAZOLE SODIUM 40 MG DR TAB] 180 tablet 0    Sig: TAKE 1 TABLET BY MOUTH TWICE A DAY     Gastroenterology: Proton Pump Inhibitors Failed - 06/21/2023 10:24 AM      Failed - Valid encounter within last 12 months    Recent Outpatient Visits   None     Future Appointments             In 3 weeks Baity, Salvadore Oxford, NP [facility] [facility], [facility]

## 2023-06-22 NOTE — Telephone Encounter (Signed)
 Duplicate request, lasted refilled 06/09/23 for 30 days.  Requested Prescriptions  Pending Prescriptions Disp Refills   clonazePAM (KLONOPIN) 0.5 MG tablet [Pharmacy Med Name: CLONAZEPAM 0.5 MG TAB] 90 tablet     Sig: TAKE 1 TABLET BY MOUTH 3 TIMES DAILY AS NEEDED     Not Delegated - Psychiatry: Anxiolytics/Hypnotics 2 Failed - 06/22/2023 11:29 AM      Failed - This refill cannot be delegated      Failed - Urine Drug Screen completed in last 360 days      Failed - Valid encounter within last 6 months    Recent Outpatient Visits   None     Future Appointments             In 2 weeks Baity, Salvadore Oxford, NP [place] [facility], [facility]            Passed - Patient is not pregnant

## 2023-07-12 ENCOUNTER — Encounter: Payer: Self-pay | Admitting: Internal Medicine

## 2023-07-12 ENCOUNTER — Ambulatory Visit (INDEPENDENT_AMBULATORY_CARE_PROVIDER_SITE_OTHER): Payer: Self-pay | Admitting: Internal Medicine

## 2023-07-12 ENCOUNTER — Other Ambulatory Visit: Payer: Self-pay | Admitting: Internal Medicine

## 2023-07-12 VITALS — BP 138/80 | HR 83 | Ht 63.0 in | Wt 154.0 lb

## 2023-07-12 DIAGNOSIS — K219 Gastro-esophageal reflux disease without esophagitis: Secondary | ICD-10-CM

## 2023-07-12 DIAGNOSIS — E7800 Pure hypercholesterolemia, unspecified: Secondary | ICD-10-CM | POA: Diagnosis not present

## 2023-07-12 DIAGNOSIS — Z6827 Body mass index (BMI) 27.0-27.9, adult: Secondary | ICD-10-CM

## 2023-07-12 DIAGNOSIS — M1990 Unspecified osteoarthritis, unspecified site: Secondary | ICD-10-CM

## 2023-07-12 DIAGNOSIS — I700 Atherosclerosis of aorta: Secondary | ICD-10-CM

## 2023-07-12 DIAGNOSIS — K5901 Slow transit constipation: Secondary | ICD-10-CM

## 2023-07-12 DIAGNOSIS — F419 Anxiety disorder, unspecified: Secondary | ICD-10-CM

## 2023-07-12 DIAGNOSIS — I10 Essential (primary) hypertension: Secondary | ICD-10-CM

## 2023-07-12 DIAGNOSIS — F411 Generalized anxiety disorder: Secondary | ICD-10-CM

## 2023-07-12 DIAGNOSIS — F319 Bipolar disorder, unspecified: Secondary | ICD-10-CM | POA: Diagnosis not present

## 2023-07-12 DIAGNOSIS — R7303 Prediabetes: Secondary | ICD-10-CM | POA: Insufficient documentation

## 2023-07-12 DIAGNOSIS — E663 Overweight: Secondary | ICD-10-CM

## 2023-07-12 MED ORDER — CLONAZEPAM 0.5 MG PO TABS: 0.5000 mg | ORAL_TABLET | Freq: Three times a day (TID) | ORAL | 0 refills | Status: DC | PRN

## 2023-07-12 MED ORDER — AMLODIPINE BESYLATE 2.5 MG PO TABS: 2.5000 mg | ORAL_TABLET | Freq: Every day | ORAL | 0 refills | Status: DC

## 2023-07-12 NOTE — Patient Instructions (Signed)

## 2023-07-12 NOTE — Assessment & Plan Note (Signed)
 C-Met and lipid profile today Encouraged her to consume low-fat diet She is unsure if she is taking her statin, advised her to make sure she is taking this daily

## 2023-07-12 NOTE — Assessment & Plan Note (Signed)
 Uncontrolled off meds Failed lisinopril  in the past We will trial amlodipine  2.5 mg daily Reinforced DASH diet

## 2023-07-12 NOTE — Assessment & Plan Note (Signed)
Continue paroxetine and clonazepam Support offered 

## 2023-07-12 NOTE — Progress Notes (Signed)
 Subjective:    Patient ID: Katrina Sutton, female    DOB: 1951/03/11, 73 y.o.   MRN: [id number]  HPI  Patient presents to clinic today for follow-up of chronic conditions.  HTN: Her BP today is 142/88.  She is not taking any antihypertensive medications at this time but she was on lisinopril  in the past.  ECG from 10/2019 reviewed.  GERD: History of esophageal stricture and hiatal hernia. She reports this has been worse lately. She denies breakthrough on pantoprazole .  There is no upper GI on file.  She follows with GI.  Anxiety and Bipolar Depression: Chronic, managed on paroxetine  and clonazepam .  She is not currently seeing a therapist or psychiatrist.  She denies SI/HI.  OA: Mainly in her right knee.  She takes tylenol  as needed with good relief of symptoms.  She follows with orthopedics as needed.  Chronic constipation: Managed with colace.  There is no colonoscopy on file.  HLD with aortic atherosclerosis: Her last LDL was 120, triglycerides 108, 12/2022.  She denies myalgias on atorvastatin .  She is not taking aspirin .  She tries to consume a low-fat diet.  Prediabetes: Her last A1c was 5.8%, 12/2022.  She is not taking any oral diabetic medication at this time.  She does not check her sugars.  Review of Systems     Past Medical History:  Diagnosis Date   Allergy    seasonal   Anxiety    Arthritis    osteoarthritis   Depression    GERD (gastroesophageal reflux disease)    Hiatal hernia     Current Outpatient Medications  Medication Sig Dispense Refill   acetaminophen  (TYLENOL ) 500 MG tablet Take 500 mg by mouth every 8 (eight) hours as needed for headache.     aspirin  81 MG EC tablet Take 1 tablet (81 mg total) by mouth daily. Swallow whole. 90 tablet 1   atorvastatin  (LIPITOR) 20 MG tablet Take 1 tablet (20 mg total) by mouth daily. 90 tablet 1   clonazePAM  (KLONOPIN ) 0.5 MG tablet TAKE 1 TABLET BY MOUTH 3 TIMES DAILY AS NEEDED 90 tablet 0   hydrocortisone cream 1 %  Apply 1 application topically 2 (two) times daily as needed for itching.      pantoprazole  (PROTONIX ) 40 MG tablet TAKE 1 TABLET BY MOUTH TWICE A DAY 180 tablet 0   PARoxetine  (PAXIL ) 40 MG tablet TAKE 1 TABLET BY MOUTH DAILY 90 tablet 1   polyethylene glycol (MIRALAX  / GLYCOLAX ) 17 g packet Take 17 g by mouth daily.     No current facility-administered medications for this visit.    Allergies  Allergen Reactions   Erythromycin Shortness Of Breath and Rash   Duloxetine  Palpitations    Family History  Problem Relation Age of Onset   Mental illness Mother    Ulcers Father    Stroke Neg Hx    Heart attack Neg Hx    Cancer Neg Hx     Social History   Socioeconomic History   Marital status: Married    Spouse name: Katrina Sutton   Number of children: 1   Years of education: Not on file   Highest education level: Not on file  Occupational History   Occupation: retired  Tobacco Use   Smoking status: Former    Current packs/day: 0.00    Types: Cigarettes    Quit date: 12/27/1976    Years since quitting: 46.5   Smokeless tobacco: Never  Vaping Use   Vaping  status: Never Used  Substance and Sexual Activity   Alcohol  use: No   Drug use: No   Sexual activity: Not Currently    Birth control/protection: None  Other Topics Concern   Not on file  Social History Narrative   Not on file   Social Drivers of Health   Financial Resource Strain: Low Risk  (11/23/2022)   Overall Financial Resource Strain (CARDIA)    Difficulty of Paying Living Expenses: Not very hard  Food Insecurity: No Food Insecurity (11/23/2022)   Hunger Vital Sign    Worried About Running Out of Food in the Last Year: Never true    Ran Out of Food in the Last Year: Never true  Transportation Needs: No Transportation Needs (11/23/2022)   PRAPARE - Administrator, Civil Service (Medical): No    Lack of Transportation (Non-Medical): No  Physical Activity: Insufficiently Active (11/23/2022)   Exercise  Vital Sign    Days of Exercise per Week: 4 days    Minutes of Exercise per Session: 20 min  Stress: No Stress Concern Present (11/23/2022)   [employer] of Occupational Health - Occupational Stress Questionnaire    Feeling of Stress : Only a little  Social Connections: Moderately Isolated (11/23/2022)   Social Connection and Isolation Panel [NHANES]    Frequency of Communication with Friends and Family: Three times a week    Frequency of Social Gatherings with Friends and Family: Never    Attends Religious Services: Never    Database administrator or Organizations: No    Attends Banker Meetings: Never    Marital Status: Married  Catering manager Violence: Not At Risk (11/23/2022)   Humiliation, Afraid, Rape, and Kick questionnaire    Fear of Current or Ex-Partner: No    Emotionally Abused: No    Physically Abused: No    Sexually Abused: No     Constitutional: Denies fever, malaise, fatigue, headache or abrupt weight changes.  HEENT: Denies eye pain, eye redness, ear pain, ringing in the ears, wax buildup, runny nose, nasal congestion, bloody nose, or sore throat. Respiratory: Denies difficulty breathing, shortness of breath, cough or sputum production.   Cardiovascular: Denies chest pain, chest tightness, palpitations or swelling in the hands or feet.  Gastrointestinal: Patient reports constipation.  Denies abdominal pain, bloating, diarrhea or blood in the stool.  GU: Denies urgency, frequency, pain with urination, burning sensation, blood in urine, odor or discharge. Musculoskeletal: Patient reports intermittent knee pain.  Denies decrease in range of motion, difficulty with gait, muscle pain or joint swelling.  Skin: Denies redness, rashes, lesions or ulcercations.  Neurological: Denies dizziness, difficulty with memory, difficulty with speech or problems with balance and coordination.  Psych: Patient has a history of anxiety and depression.  Denies SI/HI.  No  other specific complaints in a complete review of systems (except as listed in HPI above).  Objective:   Physical Exam BP 138/80   Pulse 83   Ht 5\' 3"  (1.6 m)   Wt 154 lb (69.9 kg)   SpO2 91%   BMI 27.28 kg/m    Wt Readings from Last 3 Encounters:  01/13/23 154 lb (69.9 kg)  07/08/22 155 lb (70.3 kg)  06/24/22 155 lb (70.3 kg)    General: Appears her stated age, overweight, in NAD. Skin: Warm, dry and intact.  HEENT: Head: normal shape and size; Eyes: sclera white, no icterus, conjunctiva pink, PERRLA and EOMs intact;  Cardiovascular: Normal rate and rhythm. S1,S2  noted.  No murmur, rubs or gallops noted. No JVD or BLE edema. No carotid bruits noted. Pulmonary/Chest: Normal effort and positive vesicular breath sounds. No respiratory distress. No wheezes, rales or ronchi noted.  Abdomen: Soft and nontender. Normal bowel sounds.  Musculoskeletal:  No difficulty with gait.  Neurological: Alert and oriented. Coordination normal.  Psychiatric: Mood and affect normal.  Mildly anxious appearing today. Judgment and thought content normal.     BMET    Component Value Date/Time   NA 139 01/13/2023 1423   NA 142 10/11/2012 2133   K 4.4 01/13/2023 1423   K 3.5 10/11/2012 2133   CL 101 01/13/2023 1423   CL 107 10/11/2012 2133   CO2 30 01/13/2023 1423   CO2 26 10/11/2012 2133   GLUCOSE 91 01/13/2023 1423   GLUCOSE 105 (H) 10/11/2012 2133   BUN 12 01/13/2023 1423   BUN 12 10/11/2012 2133   CREATININE 1.11 (H) 01/13/2023 1423   CALCIUM  9.5 01/13/2023 1423   CALCIUM  9.7 10/11/2012 2133   GFRNONAA >60 11/01/2019 0431   GFRNONAA 62 10/25/2019 1003   GFRAA >60 11/01/2019 0431   GFRAA 72 10/25/2019 1003    Lipid Panel     Component Value Date/Time   CHOL 203 (H) 01/13/2023 1423   TRIG 108 01/13/2023 1423   HDL 61 01/13/2023 1423   CHOLHDL 3.3 01/13/2023 1423   LDLCALC 120 (H) 01/13/2023 1423    CBC    Component Value Date/Time   WBC 7.8 01/13/2023 1423   RBC 5.16 (H)  01/13/2023 1423   HGB 15.1 01/13/2023 1423   HGB 14.9 10/11/2012 2133   HCT 45.4 (H) 01/13/2023 1423   HCT 41.7 10/11/2012 2133   PLT 299 01/13/2023 1423   PLT 249 10/11/2012 2133   MCV 88.0 01/13/2023 1423   MCV 87 10/11/2012 2133   MCH 29.3 01/13/2023 1423   MCHC 33.3 01/13/2023 1423   RDW 13.4 01/13/2023 1423   RDW 13.6 10/11/2012 2133   LYMPHSABS 1.4 10/29/2019 0548   LYMPHSABS 2.0 06/28/2011 1412   MONOABS 0.9 10/29/2019 0548   MONOABS 0.7 06/28/2011 1412   EOSABS 0.1 10/29/2019 0548   EOSABS 0.0 06/28/2011 1412   BASOSABS 0.0 10/29/2019 0548   BASOSABS 0.0 06/28/2011 1412    Hgb A1C Lab Results  Component Value Date   HGBA1C 5.8 (H) 01/13/2023            Assessment & Plan:     RTC in 2 weeks, follow-up HTN 6 months for your annual exam Helayne Lo, NP

## 2023-07-12 NOTE — Assessment & Plan Note (Signed)
 Continue Tylenol as needed

## 2023-07-12 NOTE — Assessment & Plan Note (Signed)
 Encouraged diet and exercise for weight loss ?

## 2023-07-12 NOTE — Assessment & Plan Note (Signed)
 Continue pantoprazole  Encourage weight loss as this can help reduce reflux symptoms

## 2023-07-12 NOTE — Assessment & Plan Note (Signed)
A1c today Encourage low-carb diet 

## 2023-07-12 NOTE — Assessment & Plan Note (Signed)
 C-Met and lipid profile today Encouraged her to consume low-fat diet She is unsure if she is actually taking a statin and she is not taking the aspirin .  Advised her to start taking both of these.

## 2023-07-12 NOTE — Assessment & Plan Note (Signed)
Continue Colace as needed Encouraged high fiber diet and adequate water intake

## 2023-07-13 LAB — HEMOGLOBIN A1C
Hgb A1c MFr Bld: 5.7 % — ABNORMAL HIGH (ref <5.7)
Mean Plasma Glucose: 117 mg/dL
eAG (mmol/L): 6.5 mmol/L

## 2023-07-13 LAB — LIPID PANEL
Cholesterol: 193 mg/dL (ref <200)
HDL: 65 mg/dL (ref >=50)
LDL Cholesterol (Calc): 108 mg/dL — ABNORMAL HIGH
Non-HDL Cholesterol (Calc): 128 mg/dL (ref <130)
Total CHOL/HDL Ratio: 3 (calc) (ref <5.0)
Triglycerides: 102 mg/dL (ref <150)

## 2023-07-13 LAB — CBC
HCT: 41.8 % (ref 35.0–45.0)
Hemoglobin: 14.1 g/dL (ref 11.7–15.5)
MCH: 29.4 pg (ref 27.0–33.0)
MCHC: 33.7 g/dL (ref 32.0–36.0)
MCV: 87.1 fL (ref 80.0–100.0)
MPV: 9.1 fL (ref 7.5–12.5)
Platelets: 260 10*3/uL (ref 140–400)
RBC: 4.8 10*6/uL (ref 3.80–5.10)
RDW: 13.8 % (ref 11.0–15.0)
WBC: 7.3 10*3/uL (ref 3.8–10.8)

## 2023-07-13 LAB — COMPREHENSIVE METABOLIC PANEL WITH GFR
AG Ratio: 1.8 (calc) (ref 1.0–2.5)
ALT: 12 U/L (ref 6–29)
AST: 16 U/L (ref 10–35)
Albumin: 4.2 g/dL (ref 3.6–5.1)
Alkaline phosphatase (APISO): 112 U/L (ref 37–153)
BUN/Creatinine Ratio: 10 (calc) (ref 6–22)
BUN: 10 mg/dL (ref 7–25)
CO2: 29 mmol/L (ref 20–32)
Calcium: 9.4 mg/dL (ref 8.6–10.4)
Chloride: 104 mmol/L (ref 98–110)
Creat: 1.03 mg/dL — ABNORMAL HIGH (ref 0.60–1.00)
Globulin: 2.3 g/dL (ref 1.9–3.7)
Glucose, Bld: 97 mg/dL (ref 65–139)
Potassium: 4.1 mmol/L (ref 3.5–5.3)
Sodium: 141 mmol/L (ref 135–146)
Total Bilirubin: 0.9 mg/dL (ref 0.2–1.2)
Total Protein: 6.5 g/dL (ref 6.1–8.1)
eGFR: 57 mL/min/{1.73_m2} — ABNORMAL LOW (ref >=60)

## 2023-07-14 NOTE — Telephone Encounter (Signed)
 OV 07/12/23 Requested Prescriptions  Pending Prescriptions Disp Refills   PARoxetine  (PAXIL ) 40 MG tablet [Pharmacy Med Name: PAROXETINE  HCL 40 MG TAB] 90 tablet 1    Sig: TAKE 1 TABLET BY MOUTH DAILY     Psychiatry:  Antidepressants - SSRI Passed - 07/14/2023  3:09 PM      Passed - Valid encounter within last 6 months    Recent Outpatient Visits           2 days ago Pure hypercholesterolemia   [place] [facility] [place], [place], [place]

## 2023-07-15 ENCOUNTER — Telehealth: Payer: Self-pay

## 2023-07-15 ENCOUNTER — Ambulatory Visit (INDEPENDENT_AMBULATORY_CARE_PROVIDER_SITE_OTHER): Payer: Medicare HMO

## 2023-07-15 VITALS — Ht 63.0 in

## 2023-07-15 DIAGNOSIS — Z1211 Encounter for screening for malignant neoplasm of colon: Secondary | ICD-10-CM

## 2023-07-15 DIAGNOSIS — Z0000 Encounter for general adult medical examination without abnormal findings: Secondary | ICD-10-CM | POA: Diagnosis not present

## 2023-07-15 MED ORDER — ATORVASTATIN CALCIUM 20 MG PO TABS: 20.0000 mg | ORAL_TABLET | Freq: Every day | ORAL | 1 refills | Status: DC

## 2023-07-15 NOTE — Telephone Encounter (Signed)
 Copied from CRM [id number]. Topic: Clinical - Lab/Test Results >> Jul 15, 2023 10:03 AM Elle L wrote: Reason for CRM: The patient returned a call regarding their lab results. I read the note verbatim and they expressed understanding. She states she has not been taking the atorvastatin  but states she will contact the pharmacy today.

## 2023-07-15 NOTE — Progress Notes (Signed)
 Subjective:   Katrina Sutton is a 73 y.o. who presents for a Medicare Wellness preventive visit.  Visit Complete: Virtual I connected with  [name] on 07/15/23 by a audio enabled telemedicine application and verified that I am speaking with the correct person using two identifiers.  Patient Location: Home  Provider Location: Office/Clinic  I discussed the limitations of evaluation and management by telemedicine. The patient expressed understanding and agreed to proceed.  Vital Signs: Because this visit was a virtual/telehealth visit, some criteria may be missing or patient reported. Any vitals not documented were not able to be obtained and vitals that have been documented are patient reported.  VideoDeclined- This patient declined Librarian, academic. Therefore the visit was completed with audio only.  Persons Participating in Visit: Patient.  AWV Questionnaire: No: Patient Medicare AWV questionnaire was not completed prior to this visit.  Cardiac Risk Factors include: advanced age (>54men, >34 women);hypertension;dyslipidemia     Objective:    Today's Vitals   07/15/23 1540  Height: 5\' 3"  (1.6 m)   Body mass index is 27.28 kg/m.     07/15/2023    4:03 PM 07/08/2022    3:08 PM 07/02/2021    2:20 PM 06/24/2020    2:59 PM 10/27/2019   12:40 AM 10/26/2019    9:23 PM 10/12/2019    6:13 PM  Advanced Directives  Does Patient Have a Medical Advance Directive? Yes Yes No No No No   Type of Estate agent of [place];Living will Healthcare Power of [place];Living will       Does patient want to make changes to medical advance directive? No - Patient declined No - Patient declined       Copy of Healthcare Power of Attorney in Chart? Yes - validated most recent copy scanned in chart (See row information) Yes - validated most recent copy scanned in chart (See row information)       Would patient like information on creating a medical  advance directive?   No - Patient declined  No - Patient declined No - Patient declined No - Patient declined    Current Medications (verified) Outpatient Encounter Medications as of 07/15/2023  Medication Sig   acetaminophen  (TYLENOL ) 500 MG tablet Take 500 mg by mouth every 8 (eight) hours as needed for headache.   amLODipine  (NORVASC ) 2.5 MG tablet Take 1 tablet (2.5 mg total) by mouth daily.   aspirin  81 MG EC tablet Take 1 tablet (81 mg total) by mouth daily. Swallow whole.   cetirizine (ZYRTEC) 10 MG tablet Take 10 mg by mouth daily.   clonazePAM  (KLONOPIN ) 0.5 MG tablet Take 1 tablet (0.5 mg total) by mouth 3 (three) times daily as needed.   docusate sodium  (COLACE) 100 MG capsule Take 100 mg by mouth 2 (two) times daily.   hydrocortisone cream 1 % Apply 1 application topically 2 (two) times daily as needed for itching.    pantoprazole  (PROTONIX ) 40 MG tablet TAKE 1 TABLET BY MOUTH TWICE A DAY   PARoxetine  (PAXIL ) 40 MG tablet TAKE 1 TABLET BY MOUTH DAILY   atorvastatin  (LIPITOR) 20 MG tablet Take 1 tablet (20 mg total) by mouth daily. (Patient not taking: Reported on 07/15/2023)   polyethylene glycol (MIRALAX  / GLYCOLAX ) 17 g packet Take 17 g by mouth daily. (Patient not taking: Reported on 07/15/2023)   [DISCONTINUED] atorvastatin  (LIPITOR) 20 MG tablet Take 1 tablet (20 mg total) by mouth daily.   No facility-administered  encounter medications on file as of 07/15/2023.    Allergies (verified) Erythromycin and Duloxetine    History: Past Medical History:  Diagnosis Date   Allergy    seasonal   Anxiety    Arthritis    osteoarthritis   Depression    GERD (gastroesophageal reflux disease)    Hiatal hernia    Past Surgical History:  Procedure Laterality Date   CHOLECYSTECTOMY N/A 05/31/2018   Procedure: LAPAROSCOPIC CHOLECYSTECTOMY;  Surgeon: Mercy Stall, MD;  Location: [facility] ORS;  Service: General;  Laterality: N/A;   TONSILLECTOMY     UMBILICAL HERNIA REPAIR N/A 05/31/2018    Procedure: HERNIA REPAIR UMBILICAL ADULT;  Surgeon: Mercy Stall, MD;  Location: [facility] OR[facility];  Service: General;  Laterality: N/A;   UMBILICAL HERNIA REPAIR N/A 10/31/2019   Procedure: HERNIA REPAIR UMBILICAL ADULT;  Surgeon: Mercy Stall, MD;  Location: [facility];  Service: General;  Laterality: N/A;   WRIST SURGERY Left    Family History  Problem Relation Age of Onset   Mental illness Mother    Ulcers Father    Stroke Neg Hx    Heart attack Neg Hx    Cancer Neg Hx    Social History   Socioeconomic History   Marital status: Married    Spouse name: Kanesia Vantrease   Number of children: 1   Years of education: Not on file   Highest education level: Not on file  Occupational History   Occupation: retired  Tobacco Use   Smoking status: Former    Current packs/day: 0.00    Types: Cigarettes    Quit date: 12/27/1976    Years since quitting: 46.5   Smokeless tobacco: Never  Vaping Use   Vaping status: Never Used  Substance and Sexual Activity   Alcohol  use: No   Drug use: No   Sexual activity: Not Currently    Birth control/protection: None  Other Topics Concern   Not on file  Social History Narrative   Not on file   Social Drivers of Health   Financial Resource Strain: Low Risk  (07/15/2023)   Overall Financial Resource Strain (CARDIA)    Difficulty of Paying Living Expenses: Not hard at all  Food Insecurity: No Food Insecurity (07/15/2023)   Hunger Vital Sign    Worried About Running Out of Food in the Last Year: Never true    Ran Out of Food in the Last Year: Never true  Transportation Needs: No Transportation Needs (07/15/2023)   PRAPARE - Administrator, Civil Service (Medical): No    Lack of Transportation (Non-Medical): No  Physical Activity: Insufficiently Active (07/15/2023)   Exercise Vital Sign    Days of Exercise per Week: 3 days    Minutes of Exercise per Session: 20 min  Stress: No Stress Concern Present (07/15/2023)   [employer] of  Occupational Health - Occupational Stress Questionnaire    Feeling of Stress : Not at all  Social Connections: Moderately Isolated (07/15/2023)   Social Connection and Isolation Panel [NHANES]    Frequency of Communication with Friends and Family: Three times a week    Frequency of Social Gatherings with Friends and Family: Never    Attends Religious Services: Never    Database administrator or Organizations: No    Attends Engineer, structural: Never    Marital Status: Married    Tobacco Counseling Counseling given: Not Answered    Clinical Intake:  Pre-visit preparation completed: Yes  Pain : No/denies pain  BMI - recorded: 27.3 Nutritional Status: BMI 25 -29 Overweight Nutritional Risks: None Diabetes: No  Lab Results  Component Value Date   HGBA1C 5.7 (H) 07/12/2023   HGBA1C 5.8 (H) 01/13/2023   HGBA1C 5.7 (H) 06/24/2022     How often do you need to have someone help you when you read instructions, pamphlets, or other written materials from your doctor or pharmacy?: 1 - Never  Interpreter Needed?: No  Information entered by :: Dellie Fergusson, LPN   Activities of Daily Living    07/15/2023    4:04 PM 01/13/2023    2:11 PM  In your present state of health, do you have any difficulty performing the following activities:  Hearing? 1 1  Vision? 0 0  Difficulty concentrating or making decisions? 0 0  Walking or climbing stairs? 1 0  Dressing or bathing? 0 0  Doing errands, shopping? 0 0  Preparing Food and eating ? N   Using the Toilet? N   In the past six months, have you accidently leaked urine? Y   Do you have problems with loss of bowel control? N   Managing your Medications? N   Managing your Finances? N   Housekeeping or managing your Housekeeping? N     Patient Care Team: Carollynn Cirri, NP as PCP - General (Internal Medicine) Roxie Cord, RN as Registered Nurse (General Practice)  Indicate any recent Medical Services you may have  received from other than [facility] providers in the past year (date may be approximate).     Assessment:   This is a routine wellness examination for Katrina Sutton.  Hearing/Vision screen Hearing Screening - Comments:: NO AIDS, DEAF IN RIGHT EAR Vision Screening - Comments:: WEARS GLASSES ALL DAY- MEWBORN   Goals Addressed             This Visit's Progress    DIET - INCREASE WATER INTAKE         Depression Screen     07/15/2023    4:00 PM 07/12/2023    1:49 PM 01/13/2023    2:11 PM 11/23/2022    2:05 PM 07/08/2022    3:06 PM 06/24/2022    1:20 PM 01/22/2022    2:00 PM  PHQ 2/9 Scores  PHQ - 2 Score 0 2 0 0 0 0 0  PHQ- 9 Score 0 11 4  0      Fall Risk     07/15/2023    4:04 PM 07/12/2023    1:49 PM 01/13/2023    2:11 PM 07/08/2022    3:10 PM 06/24/2022    1:20 PM  Fall Risk   Falls in the past year? 0 0 0 0 0  Number falls in past yr: 0  0 0   Injury with Fall? 0  0 0 0  Risk for fall due to : No Fall Risks   No Fall Risks No Fall Risks  Follow up Falls prevention discussed;Falls evaluation completed   Falls prevention discussed;Falls evaluation completed     MEDICARE RISK AT HOME:  Medicare Risk at Home Any stairs in or around the home?: Yes If so, are there any without handrails?: Yes Home free of loose throw rugs in walkways, pet beds, electrical cords, etc?: Yes Adequate lighting in your home to reduce risk of falls?: Yes Life alert?: No Use of a cane, walker or w/c?: No Grab bars in the bathroom?: No Shower chair or bench in shower?: No Elevated toilet seat or a handicapped  toilet?: No  TIMED UP AND GO:  Was the test performed?  No  Cognitive Function: 6CIT completed        07/15/2023    4:06 PM 07/08/2022    3:13 PM 07/02/2021    2:24 PM 06/24/2020    3:02 PM  6CIT Screen  What Year? 0 points 0 points 0 points 0 points  What month? 0 points 0 points 0 points 0 points  What time? 0 points 0 points 0 points 0 points  Count back from 20 0 points 0 points 0 points 0  points  Months in reverse 4 points 0 points 0 points 0 points  Repeat phrase 0 points 0 points 2 points 0 points  Total Score 4 points 0 points 2 points 0 points    Immunizations Immunization History  Administered Date(s) Administered   Tdap 09/13/2015    Screening Tests Health Maintenance  Topic Date Due   Zoster Vaccines- Shingrix (1 of 2) Never done   Fecal DNA (Cologuard)  Never done   DEXA SCAN  01/13/2024 (Originally 03/22/2013)   INFLUENZA VACCINE  10/14/2023   Medicare Annual Wellness (AWV)  07/14/2024   DTaP/Tdap/Td (2 - Td or Tdap) 09/12/2025   Hepatitis C Screening  Completed   HPV VACCINES  Aged Out   Meningococcal B Vaccine  Aged Out   Pneumonia Vaccine 57+ Years old  Discontinued   COVID-19 Vaccine  Discontinued    Health Maintenance  Health Maintenance Due  Topic Date Due   Zoster Vaccines- Shingrix (1 of 2) Never done   Fecal DNA (Cologuard)  Never done   Health Maintenance Items Addressed: COLOGUARD ORDERED, DECLINES MAMMOGRAM & BONE DENSITY; DECLINES ALL SHOTS, BUT UP TO DATE ON TDAP  Additional Screening:  Vision Screening: Recommended annual ophthalmology exams for early detection of glaucoma and other disorders of the eye.  Dental Screening: Recommended annual dental exams for proper oral hygiene  Community Resource Referral / Chronic Care Management: CRR required this visit?  No   CCM required this visit?  No     Plan:     I have personally reviewed and noted the following in the patient's chart:   Medical and social history Use of alcohol , tobacco or illicit drugs  Current medications and supplements including opioid prescriptions. Patient is not currently taking opioid prescriptions. Functional ability and status Nutritional status Physical activity Advanced directives List of other physicians Hospitalizations, surgeries, and ER visits in previous 12 months Vitals Screenings to include cognitive, depression, and falls Referrals  and appointments  In addition, I have reviewed and discussed with patient certain preventive protocols, quality metrics, and best practice recommendations. A written personalized care plan for preventive services as well as general preventive health recommendations were provided to patient.     Pinky Bright, LPN   03/20/1094   After Visit Summary: (MyChart) Due to this being a telephonic visit, the after visit summary with patients personalized plan was offered to patient via MyChart   Notes: COLOGUARD ORDERED

## 2023-07-15 NOTE — Patient Instructions (Signed)
 Katrina Sutton , Thank you for taking time to come for your Medicare Wellness Visit. I appreciate your ongoing commitment to your health goals. Please review the following plan we discussed and let me know if I can assist you in the future.   Referrals/Orders/Follow-Ups/Clinician Recommendations: COLOGUARD ORDERED   This is a list of the screening recommended for you and due dates:  Health Maintenance  Topic Date Due   Zoster (Shingles) Vaccine (1 of 2) Never done   Cologuard (Stool DNA test)  Never done   DEXA scan (bone density measurement)  01/13/2024*   Flu Shot  10/14/2023   Medicare Annual Wellness Visit  07/14/2024   DTaP/Tdap/Td vaccine (2 - Td or Tdap) 09/12/2025   Hepatitis C Screening  Completed   HPV Vaccine  Aged Out   Meningitis B Vaccine  Aged Out   Pneumonia Vaccine  Discontinued   COVID-19 Vaccine  Discontinued  *Topic was postponed. The date shown is not the original due date.    Advanced directives: (ACP Link)Information on Advanced Care Planning can be found at [place]  Secretary of [facility] Advance Health Care Directives Advance Health Care Directives. [email or web address]   Next Medicare Annual Wellness Visit scheduled for next year: Yes  07/20/24 @ 2:40 PM BY PHONE  Have you seen your provider in the last 6 months (3 months if uncontrolled diabetes)? Yes

## 2023-08-04 ENCOUNTER — Encounter: Payer: Self-pay | Admitting: Internal Medicine

## 2023-08-04 ENCOUNTER — Ambulatory Visit (INDEPENDENT_AMBULATORY_CARE_PROVIDER_SITE_OTHER): Admitting: Internal Medicine

## 2023-08-04 VITALS — BP 128/84 | Ht 63.0 in | Wt 153.4 lb

## 2023-08-04 DIAGNOSIS — E663 Overweight: Secondary | ICD-10-CM | POA: Diagnosis not present

## 2023-08-04 DIAGNOSIS — Z6827 Body mass index (BMI) 27.0-27.9, adult: Secondary | ICD-10-CM | POA: Diagnosis not present

## 2023-08-04 DIAGNOSIS — I10 Essential (primary) hypertension: Secondary | ICD-10-CM

## 2023-08-04 MED ORDER — AMLODIPINE BESYLATE 2.5 MG PO TABS: 2.5000 mg | ORAL_TABLET | Freq: Every day | ORAL | 0 refills | Status: DC

## 2023-08-04 NOTE — Assessment & Plan Note (Signed)
 Controlled on amlodipine , refilled today Encouraged DASH diet and exercise for weight loss

## 2023-08-04 NOTE — Assessment & Plan Note (Signed)
 Encouraged diet and exercise for weight loss ?

## 2023-08-04 NOTE — Progress Notes (Signed)
 Subjective:    Patient ID: Katrina Sutton, female    DOB: January 03, 1951, 73 y.o.   MRN: [id number]  HPI  Patient presents to clinic today for 2-week follow-up of HTN.  At her last visit, she was started on amlodipine .  She has been taking the medication as prescribed.  Her BP today is 128/84.  She has failed lisinopril  in the past.  ECG from 10/2019 reviewed.    Review of Systems     Past Medical History:  Diagnosis Date   Allergy    seasonal   Anxiety    Arthritis    osteoarthritis   Depression    GERD (gastroesophageal reflux disease)    Hiatal hernia     Current Outpatient Medications  Medication Sig Dispense Refill   acetaminophen  (TYLENOL ) 500 MG tablet Take 500 mg by mouth every 8 (eight) hours as needed for headache.     amLODipine  (NORVASC ) 2.5 MG tablet Take 1 tablet (2.5 mg total) by mouth daily. 90 tablet 0   aspirin  81 MG EC tablet Take 1 tablet (81 mg total) by mouth daily. Swallow whole. 90 tablet 1   atorvastatin  (LIPITOR) 20 MG tablet Take 1 tablet (20 mg total) by mouth daily. (Patient not taking: Reported on 07/15/2023) 90 tablet 1   cetirizine (ZYRTEC) 10 MG tablet Take 10 mg by mouth daily.     clonazePAM  (KLONOPIN ) 0.5 MG tablet Take 1 tablet (0.5 mg total) by mouth 3 (three) times daily as needed. 90 tablet 0   docusate sodium  (COLACE) 100 MG capsule Take 100 mg by mouth 2 (two) times daily.     hydrocortisone cream 1 % Apply 1 application topically 2 (two) times daily as needed for itching.      pantoprazole  (PROTONIX ) 40 MG tablet TAKE 1 TABLET BY MOUTH TWICE A DAY 180 tablet 0   PARoxetine  (PAXIL ) 40 MG tablet TAKE 1 TABLET BY MOUTH DAILY 90 tablet 1   polyethylene glycol (MIRALAX  / GLYCOLAX ) 17 g packet Take 17 g by mouth daily. (Patient not taking: Reported on 07/15/2023)     No current facility-administered medications for this visit.    Allergies  Allergen Reactions   Erythromycin Shortness Of Breath and Rash   Duloxetine  Palpitations    Family  History  Problem Relation Age of Onset   Mental illness Mother    Ulcers Father    Stroke Neg Hx    Heart attack Neg Hx    Cancer Neg Hx     Social History   Socioeconomic History   Marital status: Married    Spouse name: Tamatha Gadbois   Number of children: 1   Years of education: Not on file   Highest education level: Not on file  Occupational History   Occupation: retired  Tobacco Use   Smoking status: Former    Current packs/day: 0.00    Types: Cigarettes    Quit date: 12/27/1976    Years since quitting: 46.6   Smokeless tobacco: Never  Vaping Use   Vaping status: Never Used  Substance and Sexual Activity   Alcohol  use: No   Drug use: No   Sexual activity: Not Currently    Birth control/protection: None  Other Topics Concern   Not on file  Social History Narrative   Not on file   Social Drivers of Health   Financial Resource Strain: Low Risk  (07/15/2023)   Overall Financial Resource Strain (CARDIA)    Difficulty of Paying Living Expenses: Not  hard at all  Food Insecurity: No Food Insecurity (07/15/2023)   Hunger Vital Sign    Worried About Running Out of Food in the Last Year: Never true    Ran Out of Food in the Last Year: Never true  Transportation Needs: No Transportation Needs (07/15/2023)   PRAPARE - Administrator, Civil Service (Medical): No    Lack of Transportation (Non-Medical): No  Physical Activity: Insufficiently Active (07/15/2023)   Exercise Vital Sign    Days of Exercise per Week: 3 days    Minutes of Exercise per Session: 20 min  Stress: No Stress Concern Present (07/15/2023)   [employer] of Occupational Health - Occupational Stress Questionnaire    Feeling of Stress : Not at all  Social Connections: Moderately Isolated (07/15/2023)   Social Connection and Isolation Panel [NHANES]    Frequency of Communication with Friends and Family: Three times a week    Frequency of Social Gatherings with Friends and Family: Never    Attends  Religious Services: Never    Database administrator or Organizations: No    Attends Banker Meetings: Never    Marital Status: Married  Catering manager Violence: Not At Risk (07/15/2023)   Humiliation, Afraid, Rape, and Kick questionnaire    Fear of Current or Ex-Partner: No    Emotionally Abused: No    Physically Abused: No    Sexually Abused: No     Constitutional: Denies fever, malaise, fatigue, headache or abrupt weight changes.  Respiratory: Denies difficulty breathing, shortness of breath, cough or sputum production.   Cardiovascular: Denies chest pain, chest tightness, palpitations or swelling in the hands or feet.  Neurological: Denies dizziness, difficulty with memory, difficulty with speech or problems with balance and coordination.   No other specific complaints in a complete review of systems (except as listed in HPI above).  Objective:   Physical Exam BP 128/84 (BP Location: Left Arm, Patient Position: Sitting, Cuff Size: Normal)   Ht 5\' 3"  (1.6 m)   Wt 153 lb 6.4 oz (69.6 kg)   BMI 27.17 kg/m     Wt Readings from Last 3 Encounters:  07/12/23 154 lb (69.9 kg)  01/13/23 154 lb (69.9 kg)  07/08/22 155 lb (70.3 kg)    General: Appears her stated age, overweight, in NAD. Cardiovascular: Normal rate and rhythm. S1,S2 noted.  No murmur, rubs or gallops noted. No JVD or BLE edema.  Pulmonary/Chest: Normal effort and positive vesicular breath sounds. No respiratory distress. No wheezes, rales or ronchi noted.  Musculoskeletal:  No difficulty with gait.  Neurological: Alert and oriented. Coordination normal.      BMET    Component Value Date/Time   NA 141 07/12/2023 1350   NA 142 10/11/2012 2133   K 4.1 07/12/2023 1350   K 3.5 10/11/2012 2133   CL 104 07/12/2023 1350   CL 107 10/11/2012 2133   CO2 29 07/12/2023 1350   CO2 26 10/11/2012 2133   GLUCOSE 97 07/12/2023 1350   GLUCOSE 105 (H) 10/11/2012 2133   BUN 10 07/12/2023 1350   BUN 12  10/11/2012 2133   CREATININE 1.03 (H) 07/12/2023 1350   CALCIUM  9.4 07/12/2023 1350   CALCIUM  9.7 10/11/2012 2133   GFRNONAA >60 11/01/2019 0431   GFRNONAA 62 10/25/2019 1003   GFRAA >60 11/01/2019 0431   GFRAA 72 10/25/2019 1003    Lipid Panel     Component Value Date/Time   CHOL 193 07/12/2023 1350  TRIG 102 07/12/2023 1350   HDL 65 07/12/2023 1350   CHOLHDL 3.0 07/12/2023 1350   LDLCALC 108 (H) 07/12/2023 1350    CBC    Component Value Date/Time   WBC 7.3 07/12/2023 1350   RBC 4.80 07/12/2023 1350   HGB 14.1 07/12/2023 1350   HGB 14.9 10/11/2012 2133   HCT 41.8 07/12/2023 1350   HCT 41.7 10/11/2012 2133   PLT 260 07/12/2023 1350   PLT 249 10/11/2012 2133   MCV 87.1 07/12/2023 1350   MCV 87 10/11/2012 2133   MCH 29.4 07/12/2023 1350   MCHC 33.7 07/12/2023 1350   RDW 13.8 07/12/2023 1350   RDW 13.6 10/11/2012 2133   LYMPHSABS 1.4 10/29/2019 0548   LYMPHSABS 2.0 06/28/2011 1412   MONOABS 0.9 10/29/2019 0548   MONOABS 0.7 06/28/2011 1412   EOSABS 0.1 10/29/2019 0548   EOSABS 0.0 06/28/2011 1412   BASOSABS 0.0 10/29/2019 0548   BASOSABS 0.0 06/28/2011 1412    Hgb A1C Lab Results  Component Value Date   HGBA1C 5.7 (H) 07/12/2023            Assessment & Plan:     RTC in 6 months for your annual exam Helayne Lo, NP

## 2023-08-04 NOTE — Patient Instructions (Signed)
 Hypertension, Adult Hypertension is another name for high blood pressure. High blood pressure forces your heart to work harder to pump blood. This can cause problems over time. There are two numbers in a blood pressure reading. There is a top number (systolic) over a bottom number (diastolic). It is best to have a blood pressure that is below 120/80. What are the causes? The cause of this condition is not known. Some other conditions can lead to high blood pressure. What increases the risk? Some lifestyle factors can make you more likely to develop high blood pressure: Smoking. Not getting enough exercise or physical activity. Being overweight. Having too much fat, sugar, calories, or salt (sodium) in your diet. Drinking too much alcohol. Other risk factors include: Having any of these conditions: Heart disease. Diabetes. High cholesterol. Kidney disease. Obstructive sleep apnea. Having a family history of high blood pressure and high cholesterol. Age. The risk increases with age. Stress. What are the signs or symptoms? High blood pressure may not cause symptoms. Very high blood pressure (hypertensive crisis) may cause: Headache. Fast or uneven heartbeats (palpitations). Shortness of breath. Nosebleed. Vomiting or feeling like you may vomit (nauseous). Changes in how you see. Very bad chest pain. Feeling dizzy. Seizures. How is this treated? This condition is treated by making healthy lifestyle changes, such as: Eating healthy foods. Exercising more. Drinking less alcohol. Your doctor may prescribe medicine if lifestyle changes do not help enough and if: Your top number is above 130. Your bottom number is above 80. Your personal target blood pressure may vary. Follow these instructions at home: Eating and drinking  If told, follow the DASH eating plan. To follow this plan: Fill one half of your plate at each meal with fruits and vegetables. Fill one fourth of your plate  at each meal with whole grains. Whole grains include whole-wheat pasta, brown rice, and whole-grain bread. Eat or drink low-fat dairy products, such as skim milk or low-fat yogurt. Fill one fourth of your plate at each meal with low-fat (lean) proteins. Low-fat proteins include fish, chicken without skin, eggs, beans, and tofu. Avoid fatty meat, cured and processed meat, or chicken with skin. Avoid pre-made or processed food. Limit the amount of salt in your diet to less than 1,500 mg each day. Do not drink alcohol if: Your doctor tells you not to drink. You are pregnant, may be pregnant, or are planning to become pregnant. If you drink alcohol: Limit how much you have to: 0-1 drink a day for women. 0-2 drinks a day for men. Know how much alcohol is in your drink. In the [place] one drink equals one 12 oz bottle of beer (355 mL), one 5 oz glass of wine (148 mL), or one 1 oz glass of hard liquor (44 mL). Lifestyle  Work with your doctor to stay at a healthy weight or to lose weight. Ask your doctor what the best weight is for you. Get at least 30 minutes of exercise that causes your heart to beat faster (aerobic exercise) most days of the week. This may include walking, swimming, or biking. Get at least 30 minutes of exercise that strengthens your muscles (resistance exercise) at least 3 days a week. This may include lifting weights or doing Pilates. Do not smoke or use any products that contain nicotine or tobacco. If you need help quitting, ask your doctor. Check your blood pressure at home as told by your doctor. Keep all follow-up visits. Medicines Take over-the-counter and prescription medicines  only as told by your doctor. Follow directions carefully. Do not skip doses of blood pressure medicine. The medicine does not work as well if you skip doses. Skipping doses also puts you at risk for problems. Ask your doctor about side effects or reactions to medicines that you should watch  for. Contact a doctor if: You think you are having a reaction to the medicine you are taking. You have headaches that keep coming back. You feel dizzy. You have swelling in your ankles. You have trouble with your vision. Get help right away if: You get a very bad headache. You start to feel mixed up (confused). You feel weak or numb. You feel faint. You have very bad pain in your: Chest. Belly (abdomen). You vomit more than once. You have trouble breathing. These symptoms may be an emergency. Get help right away. Call 911. Do not wait to see if the symptoms will go away. Do not drive yourself to the hospital. Summary Hypertension is another name for high blood pressure. High blood pressure forces your heart to work harder to pump blood. For most people, a normal blood pressure is less than 120/80. Making healthy choices can help lower blood pressure. If your blood pressure does not get lower with healthy choices, you may need to take medicine. This information is not intended to replace advice given to you by your health care provider. Make sure you discuss any questions you have with your health care provider. Document Revised: 12/18/2020 Document Reviewed: 12/18/2020 Elsevier Patient Education  2024 [employer].

## 2023-08-09 ENCOUNTER — Other Ambulatory Visit: Payer: Self-pay | Admitting: Internal Medicine

## 2023-08-09 NOTE — Telephone Encounter (Unsigned)
 Copied from CRM [id number]. Topic: Clinical - Medication Refill >> Aug 09, 2023  9:39 AM Dewanda Foots wrote: Medication: clonazePAM  (KLONOPIN ) 0.5 MG tablet  Has the patient contacted their pharmacy? Yes (Agent: If no, request that the patient contact the pharmacy for the refill. If patient does not wish to contact the pharmacy document the reason why and proceed with request.) (Agent: If yes, when and what did the pharmacy advise?)  This is the patient's preferred pharmacy:  [facility] - [place], [place] - [address] [place] [address] Phone: [phone] Fax: [phone]  Is this the correct pharmacy for this prescription? Yes If no, delete pharmacy and type the correct one.   Has the prescription been filled recently? No  Is the patient out of the medication? No, runs out of medication on 6/2  Has the patient been seen for an appointment in the last year OR does the patient have an upcoming appointment? Yes  Can we respond through MyChart? No, please call [phone]  Agent: Please be advised that Rx refills may take up to 3 business days. We ask that you follow-up with your pharmacy.

## 2023-08-10 ENCOUNTER — Other Ambulatory Visit: Payer: Self-pay | Admitting: Internal Medicine

## 2023-08-11 ENCOUNTER — Other Ambulatory Visit: Payer: Self-pay | Admitting: Internal Medicine

## 2023-08-11 NOTE — Telephone Encounter (Signed)
 Copied from CRM [id number]. Topic: Clinical - Medication Refill >> Aug 11, 2023  1:22 PM Minus Amel G wrote: Medication: clonazePAM  (KLONOPIN ) 0.5 MG tablet  Has the patient contacted their pharmacy? Yes (Agent: If no, request that the patient contact the pharmacy for the refill. If patient does not wish to contact the pharmacy document the reason why and proceed with request.) (Agent: If yes, when and what did the pharmacy advise?)  This is the patient's preferred pharmacy:  [facility] - [place], [place] - [address] [place] [address] Phone: [phone] Fax: [phone]   Is this the correct pharmacy for this prescription? Yes If no, delete pharmacy and type the correct one.   Has the prescription been filled recently? No  Is the patient out of the medication? No 4 days left  Has the patient been seen for an appointment in the last year OR does the patient have an upcoming appointment? Yes  Can we respond through MyChart? No  Agent: Please be advised that Rx refills may take up to 3 business days. We ask that you follow-up with your pharmacy.

## 2023-08-11 NOTE — Telephone Encounter (Signed)
 Requested medication (s) are due for refill today: Yes  Requested medication (s) are on the active medication list: Yes  Last refill:  07/12/23  Future visit scheduled: Yes  Notes to clinic:  Unable to refill per protocol, cannot delegate.      Requested Prescriptions  Pending Prescriptions Disp Refills   clonazePAM  (KLONOPIN ) 0.5 MG tablet 90 tablet 0    Sig: Take 1 tablet (0.5 mg total) by mouth 3 (three) times daily as needed.     Not Delegated - Psychiatry: Anxiolytics/Hypnotics 2 Failed - 08/11/2023  2:40 PM      Failed - This refill cannot be delegated      Failed - Urine Drug Screen completed in last 360 days      Passed - Patient is not pregnant      Passed - Valid encounter within last 6 months    Recent Outpatient Visits           1 week ago Primary hypertension   [place] [facility] [place], Rankin Buzzard, NP   1 month ago Pure hypercholesterolemia   [place] [facility] [place], Rankin Buzzard, [place]

## 2023-08-11 NOTE — Telephone Encounter (Signed)
 Requested medication (s) are due for refill today: Yes  Requested medication (s) are on the active medication list: Yes  Last refill:  07/12/23  Future visit scheduled: Yes  Notes to clinic:  Unable to refill per protocol, cannot delegate.      Requested Prescriptions  Pending Prescriptions Disp Refills   clonazePAM  (KLONOPIN ) 0.5 MG tablet [Pharmacy Med Name: CLONAZEPAM  0.5 MG TAB] 90 tablet     Sig: TAKE 1 TABLET BY MOUTH 3 TIMES DAILY     Not Delegated - Psychiatry: Anxiolytics/Hypnotics 2 Failed - 08/11/2023  2:42 PM      Failed - This refill cannot be delegated      Failed - Urine Drug Screen completed in last 360 days      Passed - Patient is not pregnant      Passed - Valid encounter within last 6 months    Recent Outpatient Visits           1 week ago Primary hypertension   [place] [facility] [place], Rankin Buzzard, NP   1 month ago Pure hypercholesterolemia   [place] [facility] [place], Rankin Buzzard, [place]

## 2023-08-11 NOTE — Telephone Encounter (Signed)
 This is the 3rd request, initial on 08/09/23 (routed to the office on 08/11/23).

## 2023-08-22 ENCOUNTER — Ambulatory Visit: Payer: Self-pay

## 2023-08-22 NOTE — Telephone Encounter (Signed)
 Left message for patient to return call OK to advise

## 2023-08-22 NOTE — Telephone Encounter (Signed)
 I would encourage her to make sure that she is consuming at least 48 ounces of water daily and on hot days at least 64 ounces.  This will help keep her hydrated and keep her blood pressure where it should be.  If she continues to feel like her blood pressure is low, would recommend she make an appointment with me for further evaluation

## 2023-08-22 NOTE — Telephone Encounter (Signed)
 Patient advised and verbalized understanding

## 2023-08-22 NOTE — Telephone Encounter (Signed)
  FYI Only or Action Required?: FYI only for provider  Patient was last seen in primary care on 08/04/2023 by Carollynn Cirri, NP. Called Nurse Triage reporting Blood Pressure Check. Symptoms began several days ago. Interventions attempted: Nothing. Symptoms are: stable.  Triage Disposition: See PCP When Office is Open (Within 3 Days)  Patient/caregiver understands and will follow disposition?: Yes    Message from Zipporah Him sent at 08/22/2023  2:15 PM EDT  Copied From CRM #[id number]. Reason for Triage: Patient states her BP is running lower than normal, she is worried about being dehydrated. States when she gets in the heat she starts to feel restless. Lookin to get advice on how she should proceed.   Reason for Disposition  Wants doctor to measure BP  Answer Assessment - Initial Assessment Questions 1. BLOOD PRESSURE: "What is the blood pressure?" "Did you take at least two measurements 5 minutes apart?"     95/68 2. ONSET: "When did you take your blood pressure?"     today 3. HOW: "How did you obtain the blood pressure?" (e.g., visiting nurse, automatic home BP monitor)     Wrist cuff at home 4. HISTORY: "Do you have a history of low blood pressure?" "What is your blood pressure normally?"     yes 5. MEDICINES: "Are you taking any medications for blood pressure?" If Yes, ask: "Have they been changed recently?"     denies 6. PULSE RATE: "Do you know what your pulse rate is?"      no 7. OTHER SYMPTOMS: "Have you been sick recently?" "Have you had a recent injury?"     No but states blood pressure is low when in the heat.  Denies numbness, weakness, dizziness 8. PREGNANCY: "Is there any chance you are pregnant?" "When was your last menstrual period?"     na  Protocols used: Blood Pressure - Low-A-AH Declined apt this week due to husband having knee surgery this week.

## 2023-08-30 ENCOUNTER — Telehealth: Payer: Self-pay

## 2023-08-30 ENCOUNTER — Ambulatory Visit: Admitting: Internal Medicine

## 2023-08-30 NOTE — Telephone Encounter (Signed)
 Copied from CRM [id number]. Topic: Appointments - Appointment Cancel/Reschedule >> Aug 30, 2023  2:18 PM Kevelyn M wrote: Patient felt bad about missing her appointment and wanted to apologize to [facility]. She said she is good on medication until her next appointment, which she rescheduled.

## 2023-08-30 NOTE — Progress Notes (Deleted)
 Subjective:    Patient ID: Katrina Sutton, female    DOB: 1950-08-07, 73 y.o.   MRN: [id number]  HPI     Review of Systems     Past Medical History:  Diagnosis Date   Allergy    seasonal   Anxiety    Arthritis    osteoarthritis   Depression    GERD (gastroesophageal reflux disease)    Hiatal hernia     Current Outpatient Medications  Medication Sig Dispense Refill   acetaminophen  (TYLENOL ) 500 MG tablet Take 500 mg by mouth every 8 (eight) hours as needed for headache.     amLODipine  (NORVASC ) 2.5 MG tablet Take 1 tablet (2.5 mg total) by mouth daily. 90 tablet 0   aspirin  81 MG EC tablet Take 1 tablet (81 mg total) by mouth daily. Swallow whole. (Patient not taking: Reported on 08/04/2023) 90 tablet 1   atorvastatin  (LIPITOR) 20 MG tablet Take 1 tablet (20 mg total) by mouth daily. 90 tablet 1   cetirizine (ZYRTEC) 10 MG tablet Take 10 mg by mouth daily.     clonazePAM  (KLONOPIN ) 0.5 MG tablet TAKE 1 TABLET BY MOUTH 3 TIMES DAILY 90 tablet 0   docusate sodium  (COLACE) 100 MG capsule Take 100 mg by mouth 2 (two) times daily.     hydrocortisone cream 1 % Apply 1 application topically 2 (two) times daily as needed for itching.      pantoprazole  (PROTONIX ) 40 MG tablet TAKE 1 TABLET BY MOUTH TWICE A DAY 180 tablet 0   PARoxetine  (PAXIL ) 40 MG tablet TAKE 1 TABLET BY MOUTH DAILY 90 tablet 1   polyethylene glycol (MIRALAX  / GLYCOLAX ) 17 g packet Take 17 g by mouth daily.     No current facility-administered medications for this visit.    Allergies  Allergen Reactions   Erythromycin Shortness Of Breath and Rash   Duloxetine  Palpitations    Family History  Problem Relation Age of Onset   Mental illness Mother    Ulcers Father    Stroke Neg Hx    Heart attack Neg Hx    Cancer Neg Hx     Social History   Socioeconomic History   Marital status: Married    Spouse name: Velencia Lenart   Number of children: 1   Years of education: Not on file   Highest education level: Not  on file  Occupational History   Occupation: retired  Tobacco Use   Smoking status: Former    Current packs/day: 0.00    Types: Cigarettes    Quit date: 12/27/1976    Years since quitting: 46.7   Smokeless tobacco: Never  Vaping Use   Vaping status: Never Used  Substance and Sexual Activity   Alcohol  use: No   Drug use: No   Sexual activity: Not Currently    Birth control/protection: None  Other Topics Concern   Not on file  Social History Narrative   Not on file   Social Drivers of Health   Financial Resource Strain: Low Risk  (07/15/2023)   Overall Financial Resource Strain (CARDIA)    Difficulty of Paying Living Expenses: Not hard at all  Food Insecurity: No Food Insecurity (07/15/2023)   Hunger Vital Sign    Worried About Running Out of Food in the Last Year: Never true    Ran Out of Food in the Last Year: Never true  Transportation Needs: No Transportation Needs (07/15/2023)   PRAPARE - Transportation    Lack of  Transportation (Medical): No    Lack of Transportation (Non-Medical): No  Physical Activity: Insufficiently Active (07/15/2023)   Exercise Vital Sign    Days of Exercise per Week: 3 days    Minutes of Exercise per Session: 20 min  Stress: No Stress Concern Present (07/15/2023)   [employer] of Occupational Health - Occupational Stress Questionnaire    Feeling of Stress : Not at all  Social Connections: Moderately Isolated (07/15/2023)   Social Connection and Isolation Panel    Frequency of Communication with Friends and Family: Three times a week    Frequency of Social Gatherings with Friends and Family: Never    Attends Religious Services: Never    Database administrator or Organizations: No    Attends Banker Meetings: Never    Marital Status: Married  Catering manager Violence: Not At Risk (07/15/2023)   Humiliation, Afraid, Rape, and Kick questionnaire    Fear of Current or Ex-Partner: No    Emotionally Abused: No    Physically Abused: No     Sexually Abused: No     Constitutional: Denies fever, malaise, fatigue, headache or abrupt weight changes.  Respiratory: Denies difficulty breathing, shortness of breath, cough or sputum production.   Cardiovascular: Denies chest pain, chest tightness, palpitations or swelling in the hands or feet.  Neurological: Denies dizziness, difficulty with memory, difficulty with speech or problems with balance and coordination.   No other specific complaints in a complete review of systems (except as listed in HPI above).  Objective:   Physical Exam There were no vitals taken for this visit.    Wt Readings from Last 3 Encounters:  08/04/23 153 lb 6.4 oz (69.6 kg)  07/12/23 154 lb (69.9 kg)  01/13/23 154 lb (69.9 kg)    General: Appears her stated age, overweight, in NAD. Cardiovascular: Normal rate and rhythm. S1,S2 noted.  No murmur, rubs or gallops noted. No JVD or BLE edema.  Pulmonary/Chest: Normal effort and positive vesicular breath sounds. No respiratory distress. No wheezes, rales or ronchi noted.  Musculoskeletal:  No difficulty with gait.  Neurological: Alert and oriented. Coordination normal.      BMET    Component Value Date/Time   NA 141 07/12/2023 1350   NA 142 10/11/2012 2133   K 4.1 07/12/2023 1350   K 3.5 10/11/2012 2133   CL 104 07/12/2023 1350   CL 107 10/11/2012 2133   CO2 29 07/12/2023 1350   CO2 26 10/11/2012 2133   GLUCOSE 97 07/12/2023 1350   GLUCOSE 105 (H) 10/11/2012 2133   BUN 10 07/12/2023 1350   BUN 12 10/11/2012 2133   CREATININE 1.03 (H) 07/12/2023 1350   CALCIUM  9.4 07/12/2023 1350   CALCIUM  9.7 10/11/2012 2133   GFRNONAA >60 11/01/2019 0431   GFRNONAA 62 10/25/2019 1003   GFRAA >60 11/01/2019 0431   GFRAA 72 10/25/2019 1003    Lipid Panel     Component Value Date/Time   CHOL 193 07/12/2023 1350   TRIG 102 07/12/2023 1350   HDL 65 07/12/2023 1350   CHOLHDL 3.0 07/12/2023 1350   LDLCALC 108 (H) 07/12/2023 1350    CBC     Component Value Date/Time   WBC 7.3 07/12/2023 1350   RBC 4.80 07/12/2023 1350   HGB 14.1 07/12/2023 1350   HGB 14.9 10/11/2012 2133   HCT 41.8 07/12/2023 1350   HCT 41.7 10/11/2012 2133   PLT 260 07/12/2023 1350   PLT 249 10/11/2012 2133   MCV 87.1  07/12/2023 1350   MCV 87 10/11/2012 2133   MCH 29.4 07/12/2023 1350   MCHC 33.7 07/12/2023 1350   RDW 13.8 07/12/2023 1350   RDW 13.6 10/11/2012 2133   LYMPHSABS 1.4 10/29/2019 0548   LYMPHSABS 2.0 06/28/2011 1412   MONOABS 0.9 10/29/2019 0548   MONOABS 0.7 06/28/2011 1412   EOSABS 0.1 10/29/2019 0548   EOSABS 0.0 06/28/2011 1412   BASOSABS 0.0 10/29/2019 0548   BASOSABS 0.0 06/28/2011 1412    Hgb A1C Lab Results  Component Value Date   HGBA1C 5.7 (H) 07/12/2023            Assessment & Plan:     RTC in 6 months for your annual exam Helayne Lo, NP

## 2023-08-30 NOTE — Telephone Encounter (Signed)
 That is okay, no worries.  She is not one that typically no-shows.  I will see her at her next visit.

## 2023-09-02 ENCOUNTER — Other Ambulatory Visit: Payer: Self-pay | Admitting: Internal Medicine

## 2023-09-06 NOTE — Telephone Encounter (Signed)
 Requested medication (s) are due for refill today: yes  Requested medication (s) are on the active medication list: yes  Last refill:  08/11/23 #90  Future visit scheduled: yes  Notes to clinic:  med not delegated to NT to RF   Requested Prescriptions  Pending Prescriptions Disp Refills   clonazePAM  (KLONOPIN ) 0.5 MG tablet [Pharmacy Med Name: CLONAZEPAM  0.5 MG TAB] 90 tablet     Sig: TAKE 1 TABLET BY MOUTH 3 TIMES DAILY     Not Delegated - Psychiatry: Anxiolytics/Hypnotics 2 Failed - 09/06/2023 10:38 AM      Failed - This refill cannot be delegated      Failed - Urine Drug Screen completed in last 360 days      Passed - Patient is not pregnant      Passed - Valid encounter within last 6 months    Recent Outpatient Visits           1 month ago Primary hypertension   [place] [facility] [place], Angeline ORN, NP   1 month ago Pure hypercholesterolemia   [place] [facility] [place], Angeline ORN, [place]

## 2023-09-08 ENCOUNTER — Ambulatory Visit: Admitting: Internal Medicine

## 2023-09-29 ENCOUNTER — Ambulatory Visit: Admitting: Internal Medicine

## 2023-10-03 ENCOUNTER — Other Ambulatory Visit: Payer: Self-pay | Admitting: Internal Medicine

## 2023-10-04 ENCOUNTER — Encounter: Payer: Self-pay | Admitting: Internal Medicine

## 2023-10-04 ENCOUNTER — Ambulatory Visit (INDEPENDENT_AMBULATORY_CARE_PROVIDER_SITE_OTHER): Admitting: Internal Medicine

## 2023-10-04 VITALS — BP 124/70 | Ht 63.0 in | Wt 158.0 lb

## 2023-10-04 DIAGNOSIS — I10 Essential (primary) hypertension: Secondary | ICD-10-CM | POA: Diagnosis not present

## 2023-10-04 DIAGNOSIS — E663 Overweight: Secondary | ICD-10-CM

## 2023-10-04 DIAGNOSIS — Z6827 Body mass index (BMI) 27.0-27.9, adult: Secondary | ICD-10-CM | POA: Diagnosis not present

## 2023-10-04 MED ORDER — AMLODIPINE BESYLATE 2.5 MG PO TABS: 2.5000 mg | ORAL_TABLET | Freq: Every day | ORAL | 0 refills | Status: DC

## 2023-10-04 NOTE — Assessment & Plan Note (Signed)
 Controlled on amlodipine  2.5 mg daily, refilled today Encouraged DASH diet and exercise for weight loss

## 2023-10-04 NOTE — Patient Instructions (Signed)
 Hypertension, Adult Hypertension is another name for high blood pressure. High blood pressure forces your heart to work harder to pump blood. This can cause problems over time. There are two numbers in a blood pressure reading. There is a top number (systolic) over a bottom number (diastolic). It is best to have a blood pressure that is below 120/80. What are the causes? The cause of this condition is not known. Some other conditions can lead to high blood pressure. What increases the risk? Some lifestyle factors can make you more likely to develop high blood pressure: Smoking. Not getting enough exercise or physical activity. Being overweight. Having too much fat, sugar, calories, or salt (sodium) in your diet. Drinking too much alcohol. Other risk factors include: Having any of these conditions: Heart disease. Diabetes. High cholesterol. Kidney disease. Obstructive sleep apnea. Having a family history of high blood pressure and high cholesterol. Age. The risk increases with age. Stress. What are the signs or symptoms? High blood pressure may not cause symptoms. Very high blood pressure (hypertensive crisis) may cause: Headache. Fast or uneven heartbeats (palpitations). Shortness of breath. Nosebleed. Vomiting or feeling like you may vomit (nauseous). Changes in how you see. Very bad chest pain. Feeling dizzy. Seizures. How is this treated? This condition is treated by making healthy lifestyle changes, such as: Eating healthy foods. Exercising more. Drinking less alcohol. Your doctor may prescribe medicine if lifestyle changes do not help enough and if: Your top number is above 130. Your bottom number is above 80. Your personal target blood pressure may vary. Follow these instructions at home: Eating and drinking  If told, follow the DASH eating plan. To follow this plan: Fill one half of your plate at each meal with fruits and vegetables. Fill one fourth of your plate  at each meal with whole grains. Whole grains include whole-wheat pasta, brown rice, and whole-grain bread. Eat or drink low-fat dairy products, such as skim milk or low-fat yogurt. Fill one fourth of your plate at each meal with low-fat (lean) proteins. Low-fat proteins include fish, chicken without skin, eggs, beans, and tofu. Avoid fatty meat, cured and processed meat, or chicken with skin. Avoid pre-made or processed food. Limit the amount of salt in your diet to less than 1,500 mg each day. Do not drink alcohol if: Your doctor tells you not to drink. You are pregnant, may be pregnant, or are planning to become pregnant. If you drink alcohol: Limit how much you have to: 0-1 drink a day for women. 0-2 drinks a day for men. Know how much alcohol is in your drink. In the [place] one drink equals one 12 oz bottle of beer (355 mL), one 5 oz glass of wine (148 mL), or one 1 oz glass of hard liquor (44 mL). Lifestyle  Work with your doctor to stay at a healthy weight or to lose weight. Ask your doctor what the best weight is for you. Get at least 30 minutes of exercise that causes your heart to beat faster (aerobic exercise) most days of the week. This may include walking, swimming, or biking. Get at least 30 minutes of exercise that strengthens your muscles (resistance exercise) at least 3 days a week. This may include lifting weights or doing Pilates. Do not smoke or use any products that contain nicotine or tobacco. If you need help quitting, ask your doctor. Check your blood pressure at home as told by your doctor. Keep all follow-up visits. Medicines Take over-the-counter and prescription medicines  only as told by your doctor. Follow directions carefully. Do not skip doses of blood pressure medicine. The medicine does not work as well if you skip doses. Skipping doses also puts you at risk for problems. Ask your doctor about side effects or reactions to medicines that you should watch  for. Contact a doctor if: You think you are having a reaction to the medicine you are taking. You have headaches that keep coming back. You feel dizzy. You have swelling in your ankles. You have trouble with your vision. Get help right away if: You get a very bad headache. You start to feel mixed up (confused). You feel weak or numb. You feel faint. You have very bad pain in your: Chest. Belly (abdomen). You vomit more than once. You have trouble breathing. These symptoms may be an emergency. Get help right away. Call 911. Do not wait to see if the symptoms will go away. Do not drive yourself to the hospital. Summary Hypertension is another name for high blood pressure. High blood pressure forces your heart to work harder to pump blood. For most people, a normal blood pressure is less than 120/80. Making healthy choices can help lower blood pressure. If your blood pressure does not get lower with healthy choices, you may need to take medicine. This information is not intended to replace advice given to you by your health care provider. Make sure you discuss any questions you have with your health care provider. Document Revised: 12/18/2020 Document Reviewed: 12/18/2020 Elsevier Patient Education  2024 [employer].

## 2023-10-04 NOTE — Progress Notes (Signed)
 Subjective:    Patient ID: Katrina Sutton, female    DOB: 11-04-1950, 73 y.o.   MRN: [id number]  HPI  Patient presents the clinic today for follow-up of HTN.  She is currently taking amlodipine  2.5 mg daily.  Her BP today is 124/70.  She reports she is getting similar readings at home.  She reports mild swelling in the legs and chronic shortness of breath with laying down but attributes this to her hiatal hernia more than anything else.  She has not had any chest pain.   Review of Systems     Past Medical History:  Diagnosis Date   Allergy    seasonal   Anxiety    Arthritis    osteoarthritis   Depression    GERD (gastroesophageal reflux disease)    Hiatal hernia     Current Outpatient Medications  Medication Sig Dispense Refill   acetaminophen  (TYLENOL ) 500 MG tablet Take 500 mg by mouth every 8 (eight) hours as needed for headache.     amLODipine  (NORVASC ) 2.5 MG tablet Take 1 tablet (2.5 mg total) by mouth daily. 90 tablet 0   aspirin  81 MG EC tablet Take 1 tablet (81 mg total) by mouth daily. Swallow whole. (Patient not taking: Reported on 08/04/2023) 90 tablet 1   atorvastatin  (LIPITOR) 20 MG tablet Take 1 tablet (20 mg total) by mouth daily. 90 tablet 1   cetirizine (ZYRTEC) 10 MG tablet Take 10 mg by mouth daily.     clonazePAM  (KLONOPIN ) 0.5 MG tablet TAKE 1 TABLET BY MOUTH 3 TIMES DAILY 90 tablet 0   docusate sodium  (COLACE) 100 MG capsule Take 100 mg by mouth 2 (two) times daily.     hydrocortisone cream 1 % Apply 1 application topically 2 (two) times daily as needed for itching.      pantoprazole  (PROTONIX ) 40 MG tablet TAKE 1 TABLET BY MOUTH TWICE A DAY 180 tablet 0   PARoxetine  (PAXIL ) 40 MG tablet TAKE 1 TABLET BY MOUTH DAILY 90 tablet 1   polyethylene glycol (MIRALAX  / GLYCOLAX ) 17 g packet Take 17 g by mouth daily.     No current facility-administered medications for this visit.    Allergies  Allergen Reactions   Erythromycin Shortness Of Breath and Rash    Duloxetine  Palpitations    Family History  Problem Relation Age of Onset   Mental illness Mother    Ulcers Father    Stroke Neg Hx    Heart attack Neg Hx    Cancer Neg Hx     Social History   Socioeconomic History   Marital status: Married    Spouse name: Amarylis Rovito   Number of children: 1   Years of education: Not on file   Highest education level: Not on file  Occupational History   Occupation: retired  Tobacco Use   Smoking status: Former    Current packs/day: 0.00    Types: Cigarettes    Quit date: 12/27/1976    Years since quitting: 46.8   Smokeless tobacco: Never  Vaping Use   Vaping status: Never Used  Substance and Sexual Activity   Alcohol  use: No   Drug use: No   Sexual activity: Not Currently    Birth control/protection: None  Other Topics Concern   Not on file  Social History Narrative   Not on file   Social Drivers of Health   Financial Resource Strain: Low Risk  (07/15/2023)   Overall Financial Resource Strain (CARDIA)  Difficulty of Paying Living Expenses: Not hard at all  Food Insecurity: No Food Insecurity (07/15/2023)   Hunger Vital Sign    Worried About Running Out of Food in the Last Year: Never true    Ran Out of Food in the Last Year: Never true  Transportation Needs: No Transportation Needs (07/15/2023)   PRAPARE - Administrator, Civil Service (Medical): No    Lack of Transportation (Non-Medical): No  Physical Activity: Insufficiently Active (07/15/2023)   Exercise Vital Sign    Days of Exercise per Week: 3 days    Minutes of Exercise per Session: 20 min  Stress: No Stress Concern Present (07/15/2023)   [employer] of Occupational Health - Occupational Stress Questionnaire    Feeling of Stress : Not at all  Social Connections: Moderately Isolated (07/15/2023)   Social Connection and Isolation Panel    Frequency of Communication with Friends and Family: Three times a week    Frequency of Social Gatherings with Friends and  Family: Never    Attends Religious Services: Never    Database administrator or Organizations: No    Attends Banker Meetings: Never    Marital Status: Married  Catering manager Violence: Not At Risk (07/15/2023)   Humiliation, Afraid, Rape, and Kick questionnaire    Fear of Current or Ex-Partner: No    Emotionally Abused: No    Physically Abused: No    Sexually Abused: No     Constitutional: Denies fever, malaise, fatigue, headache or abrupt weight changes.  Respiratory: Denies difficulty breathing, shortness of breath, cough or sputum production.   Cardiovascular: Patient reports swelling in legs.  Denies chest pain, chest tightness, palpitations or swelling in the hands.  Neurological: Denies dizziness, difficulty with memory, difficulty with speech or problems with balance and coordination.   No other specific complaints in a complete review of systems (except as listed in HPI above).  Objective:   Physical Exam BP 124/70 (BP Location: Left Arm, Patient Position: Sitting, Cuff Size: Normal)   Ht 5' 3 (1.6 m)   Wt 158 lb (71.7 kg)   BMI 27.99 kg/m     Wt Readings from Last 3 Encounters:  08/04/23 153 lb 6.4 oz (69.6 kg)  07/12/23 154 lb (69.9 kg)  01/13/23 154 lb (69.9 kg)    General: Appears her stated age, overweight, in NAD. Cardiovascular: Normal rate and rhythm. S1,S2 noted.  No murmur, rubs or gallops noted. No JVD.  Trace nonpitting BLE edema.  Pulmonary/Chest: Normal effort and positive vesicular breath sounds. No respiratory distress. No wheezes, rales or ronchi noted.  Musculoskeletal:  No difficulty with gait.  Neurological: Alert and oriented. Coordination normal.      BMET    Component Value Date/Time   NA 141 07/12/2023 1350   NA 142 10/11/2012 2133   K 4.1 07/12/2023 1350   K 3.5 10/11/2012 2133   CL 104 07/12/2023 1350   CL 107 10/11/2012 2133   CO2 29 07/12/2023 1350   CO2 26 10/11/2012 2133   GLUCOSE 97 07/12/2023 1350    GLUCOSE 105 (H) 10/11/2012 2133   BUN 10 07/12/2023 1350   BUN 12 10/11/2012 2133   CREATININE 1.03 (H) 07/12/2023 1350   CALCIUM  9.4 07/12/2023 1350   CALCIUM  9.7 10/11/2012 2133   GFRNONAA >60 11/01/2019 0431   GFRNONAA 62 10/25/2019 1003   GFRAA >60 11/01/2019 0431   GFRAA 72 10/25/2019 1003    Lipid Panel  Component Value Date/Time   CHOL 193 07/12/2023 1350   TRIG 102 07/12/2023 1350   HDL 65 07/12/2023 1350   CHOLHDL 3.0 07/12/2023 1350   LDLCALC 108 (H) 07/12/2023 1350    CBC    Component Value Date/Time   WBC 7.3 07/12/2023 1350   RBC 4.80 07/12/2023 1350   HGB 14.1 07/12/2023 1350   HGB 14.9 10/11/2012 2133   HCT 41.8 07/12/2023 1350   HCT 41.7 10/11/2012 2133   PLT 260 07/12/2023 1350   PLT 249 10/11/2012 2133   MCV 87.1 07/12/2023 1350   MCV 87 10/11/2012 2133   MCH 29.4 07/12/2023 1350   MCHC 33.7 07/12/2023 1350   RDW 13.8 07/12/2023 1350   RDW 13.6 10/11/2012 2133   LYMPHSABS 1.4 10/29/2019 0548   LYMPHSABS 2.0 06/28/2011 1412   MONOABS 0.9 10/29/2019 0548   MONOABS 0.7 06/28/2011 1412   EOSABS 0.1 10/29/2019 0548   EOSABS 0.0 06/28/2011 1412   BASOSABS 0.0 10/29/2019 0548   BASOSABS 0.0 06/28/2011 1412    Hgb A1C Lab Results  Component Value Date   HGBA1C 5.7 (H) 07/12/2023            Assessment & Plan:     RTC in 4 months for your annual exam Angeline Laura, NP

## 2023-10-04 NOTE — Assessment & Plan Note (Signed)
 Encouraged diet and exercise for weight loss ?

## 2023-10-05 NOTE — Telephone Encounter (Signed)
 Requested medication (s) are due for refill today: yes  Requested medication (s) are on the active medication list: yes  Last refill:  09/06/23  Future visit scheduled: yes  Notes to clinic:  Unable to refill per protocol, cannot delegate.      Requested Prescriptions  Pending Prescriptions Disp Refills   clonazePAM  (KLONOPIN ) 0.5 MG tablet [Pharmacy Med Name: CLONAZEPAM  0.5 MG TAB] 90 tablet     Sig: TAKE 1 TABLET BY MOUTH 3 TIMES DAILY     Not Delegated - Psychiatry: Anxiolytics/Hypnotics 2 Failed - 10/05/2023 12:01 PM      Failed - This refill cannot be delegated      Failed - Urine Drug Screen completed in last 360 days      Passed - Patient is not pregnant      Passed - Valid encounter within last 6 months    Recent Outpatient Visits           Yesterday Primary hypertension   [place] [facility] [place], Angeline ORN, NP   2 months ago Primary hypertension   [place] [facility] [place], Angeline ORN, NP   2 months ago Pure hypercholesterolemia   [place] [facility] [place], Angeline ORN, [place]

## 2023-10-17 ENCOUNTER — Other Ambulatory Visit: Payer: Self-pay | Admitting: Internal Medicine

## 2023-10-18 NOTE — Telephone Encounter (Signed)
 Requested Prescriptions  Pending Prescriptions Disp Refills   pantoprazole  (PROTONIX ) 40 MG tablet [Pharmacy Med Name: PANTOPRAZOLE  SODIUM 40 MG DR TAB] 180 tablet 1    Sig: TAKE 1 TABLET BY MOUTH TWICE A DAY     Gastroenterology: Proton Pump Inhibitors Passed - 10/18/2023  1:50 PM      Passed - Valid encounter within last 12 months    Recent Outpatient Visits           2 weeks ago Primary hypertension   [place] [facility] [place], Angeline ORN, NP   2 months ago Primary hypertension   [place] [facility] [place], Angeline ORN, NP   3 months ago Pure hypercholesterolemia   [place] [facility] [place], Angeline ORN, [place]

## 2023-11-02 ENCOUNTER — Other Ambulatory Visit: Payer: Self-pay | Admitting: Internal Medicine

## 2023-11-03 NOTE — Telephone Encounter (Signed)
 Requested medications are due for refill today.  yes  Requested medications are on the active medications list.  yes  Last refill. 10/06/2023 #90 0 rf  Future visit scheduled.   yes  Notes to clinic.  Refill not delegated.    Requested Prescriptions  Pending Prescriptions Disp Refills   clonazePAM  (KLONOPIN ) 0.5 MG tablet [Pharmacy Med Name: CLONAZEPAM  0.5 MG TAB] 90 tablet     Sig: TAKE 1 TABLET BY MOUTH 3 TIMES DAILY     Not Delegated - Psychiatry: Anxiolytics/Hypnotics 2 Failed - 11/03/2023  2:01 PM      Failed - This refill cannot be delegated      Failed - Urine Drug Screen completed in last 360 days      Passed - Patient is not pregnant      Passed - Valid encounter within last 6 months    Recent Outpatient Visits           1 month ago Primary hypertension   [place] [facility] [place], Angeline ORN, NP   3 months ago Primary hypertension   [place] [facility] [place], Angeline ORN, NP   3 months ago Pure hypercholesterolemia   [place] [facility] [place], Angeline ORN, [place]

## 2023-12-01 ENCOUNTER — Other Ambulatory Visit: Payer: Self-pay | Admitting: Internal Medicine

## 2023-12-02 NOTE — Telephone Encounter (Signed)
 Requested medication (s) are due for refill today: na  Requested medication (s) are on the active medication list: yes  Last refill:  11/03/23 #90 0 refills  Future visit scheduled: yes 01/19/24  Notes to clinic:  not delegated per protocol. Do you want to refill Rx?     Requested Prescriptions  Pending Prescriptions Disp Refills   clonazePAM  (KLONOPIN ) 0.5 MG tablet [Pharmacy Med Name: CLONAZEPAM  0.5 MG TAB] 90 tablet     Sig: TAKE 1 TABLET BY MOUTH 3 TIMES DAILY     Not Delegated - Psychiatry: Anxiolytics/Hypnotics 2 Failed - 12/02/2023 11:34 AM      Failed - This refill cannot be delegated      Failed - Urine Drug Screen completed in last 360 days      Passed - Patient is not pregnant      Passed - Valid encounter within last 6 months    Recent Outpatient Visits           1 month ago Primary hypertension   [place] [facility] [place], Angeline ORN, NP   4 months ago Primary hypertension   [place] [facility] [place], Angeline ORN, NP   4 months ago Pure hypercholesterolemia   [place] [facility] [place], Angeline ORN, [place]

## 2023-12-30 ENCOUNTER — Other Ambulatory Visit: Payer: Self-pay | Admitting: Internal Medicine

## 2024-01-02 NOTE — Telephone Encounter (Signed)
 Requested medications are due for refill today.  yes  Requested medications are on the active medications list.  yes  Last refill. 12/02/2023 #90 0 rf  Future visit scheduled.   yes  Notes to clinic.  Refill not delegated.    Requested Prescriptions  Pending Prescriptions Disp Refills   clonazePAM  (KLONOPIN ) 0.5 MG tablet [Pharmacy Med Name: CLONAZEPAM  0.5 MG TAB] 90 tablet     Sig: TAKE 1 TABLET BY MOUTH 3 TIMES DAILY     Not Delegated - Psychiatry: Anxiolytics/Hypnotics 2 Failed - 01/02/2024  2:10 PM      Failed - This refill cannot be delegated      Failed - Urine Drug Screen completed in last 360 days      Passed - Patient is not pregnant      Passed - Valid encounter within last 6 months    Recent Outpatient Visits           3 months ago Primary hypertension   [place] [facility] [place], Angeline ORN, NP   5 months ago Primary hypertension   [place] [facility] [place], Angeline ORN, NP   5 months ago Pure hypercholesterolemia   [place] [facility] [place], Angeline ORN, [place]

## 2024-01-06 NOTE — Progress Notes (Signed)
 Katrina Sutton                                          MRN: [id number]   01/06/2024   The [facility] Quality Team Specialist reviewed this patient medical record for the purposes of chart review for care gap closure. The following were reviewed: chart review for care gap closure-colorectal cancer screening.    [facility]I Quality Team

## 2024-01-12 ENCOUNTER — Encounter: Admitting: Internal Medicine

## 2024-01-12 ENCOUNTER — Other Ambulatory Visit: Payer: Self-pay | Admitting: Internal Medicine

## 2024-01-12 DIAGNOSIS — F419 Anxiety disorder, unspecified: Secondary | ICD-10-CM

## 2024-01-13 NOTE — Telephone Encounter (Signed)
 Requested Prescriptions  Pending Prescriptions Disp Refills   PARoxetine  (PAXIL ) 40 MG tablet [Pharmacy Med Name: PAROXETINE  HCL 40 MG TAB] 90 tablet 0    Sig: TAKE 1 TABLET BY MOUTH DAILY     Psychiatry:  Antidepressants - SSRI Passed - 01/13/2024  4:12 PM      Passed - Valid encounter within last 6 months    Recent Outpatient Visits           3 months ago Primary hypertension   [place] [facility] [place], Angeline ORN, NP   5 months ago Primary hypertension   [place] [facility] [place], Angeline ORN, NP   6 months ago Pure hypercholesterolemia   [place] [facility] [place], Angeline ORN, [place]

## 2024-01-19 ENCOUNTER — Encounter: Admitting: Internal Medicine

## 2024-01-27 ENCOUNTER — Other Ambulatory Visit: Payer: Self-pay | Admitting: Internal Medicine

## 2024-01-29 NOTE — Telephone Encounter (Signed)
 Requested medications are due for refill today.  A few days early  Requested medications are on the active medications list.  yes  Last refill. 01/02/2024 #90 0 rf  Future visit scheduled.   yes  Notes to clinic.  Refill not delegated.    Requested Prescriptions  Pending Prescriptions Disp Refills   clonazePAM  (KLONOPIN ) 0.5 MG tablet [Pharmacy Med Name: CLONAZEPAM  0.5 MG TAB] 90 tablet     Sig: TAKE 1 TABLET BY MOUTH 3 TIMES DAILY     Not Delegated - Psychiatry: Anxiolytics/Hypnotics 2 Failed - 01/29/2024  3:07 PM      Failed - This refill cannot be delegated      Failed - Urine Drug Screen completed in last 360 days      Passed - Patient is not pregnant      Passed - Valid encounter within last 6 months    Recent Outpatient Visits           3 months ago Primary hypertension   [place] [facility] [place], Angeline ORN, NP   5 months ago Primary hypertension   [place] [facility], Angeline ORN, NP   6 months ago Pure hypercholesterolemia   [place] [facility] [place], Angeline ORN, [place]

## 2024-02-28 ENCOUNTER — Other Ambulatory Visit: Payer: Self-pay | Admitting: Internal Medicine

## 2024-02-29 ENCOUNTER — Other Ambulatory Visit: Payer: Self-pay

## 2024-02-29 NOTE — Telephone Encounter (Signed)
 Copied from CRM #[id number]. Topic: Clinical - Medication Question >> Feb 29, 2024  4:21 PM Anairis L wrote: Reason for CRM: clonazePAM  0.5 mg Oral 3 times daily is still pending, patient will run out by Sunday.   Thank you.

## 2024-03-01 MED ORDER — CLONAZEPAM 0.5 MG PO TABS: 0.5000 mg | ORAL_TABLET | Freq: Three times a day (TID) | ORAL | 0 refills | Status: DC

## 2024-03-01 NOTE — Progress Notes (Unsigned)
 Subjective:    Patient ID: Katrina Sutton, female    DOB: 1950-05-17, 73 y.o.   MRN: [id number]  HPI  Patient presents to clinic today for her annual exam.  Flu: Never Tetanus: 09/2015 COVID: Never Pneumovax: Never Prevnar: Never Shingrix: Never Pap smear: >5 years ago Mammogram: > 2 years ago Bone density: Never Colon screening: Never Vision screening: as needed Dentist: as needed  Diet: She does eat some meat. She consumes fruits and veggies. She tries to avoid fried foods. She drinks mostly water, tea. Exercise: None   Review of Systems     Past Medical History:  Diagnosis Date   Allergy    seasonal   Anxiety    Arthritis    osteoarthritis   Depression    GERD (gastroesophageal reflux disease)    Hiatal hernia     Current Outpatient Medications  Medication Sig Dispense Refill   acetaminophen  (TYLENOL ) 500 MG tablet Take 500 mg by mouth every 8 (eight) hours as needed for headache.     amLODipine  (NORVASC ) 2.5 MG tablet Take 1 tablet (2.5 mg total) by mouth daily. 90 tablet 0   aspirin  81 MG EC tablet Take 1 tablet (81 mg total) by mouth daily. Swallow whole. (Patient not taking: Reported on 10/04/2023) 90 tablet 1   atorvastatin  (LIPITOR) 20 MG tablet Take 1 tablet (20 mg total) by mouth daily. 90 tablet 1   cetirizine (ZYRTEC) 10 MG tablet Take 10 mg by mouth daily.     clonazePAM  (KLONOPIN ) 0.5 MG tablet Take 1 tablet (0.5 mg total) by mouth 3 (three) times daily. 90 tablet 0   docusate sodium  (COLACE) 100 MG capsule Take 100 mg by mouth 2 (two) times daily.     hydrocortisone cream 1 % Apply 1 application topically 2 (two) times daily as needed for itching.      pantoprazole  (PROTONIX ) 40 MG tablet TAKE 1 TABLET BY MOUTH TWICE A DAY 180 tablet 1   PARoxetine  (PAXIL ) 40 MG tablet TAKE 1 TABLET BY MOUTH DAILY 90 tablet 0   polyethylene glycol (MIRALAX  / GLYCOLAX ) 17 g packet Take 17 g by mouth daily.     No current facility-administered medications for this  visit.    Allergies  Allergen Reactions   Erythromycin Shortness Of Breath and Rash   Duloxetine  Palpitations    Family History  Problem Relation Age of Onset   Mental illness Mother    Ulcers Father    Stroke Neg Hx    Heart attack Neg Hx    Cancer Neg Hx     Social History   Socioeconomic History   Marital status: Married    Spouse name: Lura Falor   Number of children: 1   Years of education: Not on file   Highest education level: Not on file  Occupational History   Occupation: retired  Tobacco Use   Smoking status: Former    Current packs/day: 0.00    Types: Cigarettes    Quit date: 12/27/1976    Years since quitting: 47.2   Smokeless tobacco: Never  Vaping Use   Vaping status: Never Used  Substance and Sexual Activity   Alcohol  use: No   Drug use: No   Sexual activity: Not Currently    Birth control/protection: None  Other Topics Concern   Not on file  Social History Narrative   Not on file   Social Drivers of Health   Tobacco Use: Medium Risk (10/04/2023)   Patient History  Smoking Tobacco Use: Former    Smokeless Tobacco Use: Never    Passive Exposure: Not on file  Financial Resource Strain: Low Risk (07/15/2023)   Overall Financial Resource Strain (CARDIA)    Difficulty of Paying Living Expenses: Not hard at all  Food Insecurity: No Food Insecurity (07/15/2023)   Hunger Vital Sign    Worried About Running Out of Food in the Last Year: Never true    Ran Out of Food in the Last Year: Never true  Transportation Needs: No Transportation Needs (07/15/2023)   PRAPARE - Administrator, Civil Service (Medical): No    Lack of Transportation (Non-Medical): No  Physical Activity: Insufficiently Active (07/15/2023)   Exercise Vital Sign    Days of Exercise per Week: 3 days    Minutes of Exercise per Session: 20 min  Stress: No Stress Concern Present (07/15/2023)   [employer] of Occupational Health - Occupational Stress Questionnaire     Feeling of Stress : Not at all  Social Connections: Moderately Isolated (07/15/2023)   Social Connection and Isolation Panel    Frequency of Communication with Friends and Family: Three times a week    Frequency of Social Gatherings with Friends and Family: Never    Attends Religious Services: Never    Database Administrator or Organizations: No    Attends Banker Meetings: Never    Marital Status: Married  Catering Manager Violence: Not At Risk (07/15/2023)   Humiliation, Afraid, Rape, and Kick questionnaire    Fear of Current or Ex-Partner: No    Emotionally Abused: No    Physically Abused: No    Sexually Abused: No  Depression (PHQ2-9): Low Risk (10/04/2023)   Depression (PHQ2-9)    PHQ-2 Score: 0  Recent Concern: Depression (PHQ2-9) - Medium Risk (08/04/2023)   Depression (PHQ2-9)    PHQ-2 Score: 8  Alcohol  Screen: Low Risk (07/15/2023)   Alcohol  Screen    Last Alcohol  Screening Score (AUDIT): 0  Housing: Unknown (07/15/2023)   Housing Stability Vital Sign    Unable to Pay for Housing in the Last Year: No    Number of Times Moved in the Last Year: Not on file    Homeless in the Last Year: No  Utilities: Not At Risk (07/15/2023)   AHC Utilities    Threatened with loss of utilities: No  Health Literacy: Adequate Health Literacy (07/15/2023)   B1300 Health Literacy    Frequency of need for help with medical instructions: Never     Constitutional: Denies fever, malaise, fatigue, headache or abrupt weight changes.  HEENT: Denies eye pain, eye redness, ear pain, ringing in the ears, wax buildup, runny nose, nasal congestion, bloody nose, or sore throat. Respiratory: Denies difficulty breathing, shortness of breath, cough or sputum production.   Cardiovascular: Denies chest pain, chest tightness, palpitations or swelling in the hands or feet.  Gastrointestinal: Patient reports constipation.  Denies abdominal pain, bloating, diarrhea or blood in the stool.  GU: Denies urgency,  frequency, pain with urination, burning sensation, blood in urine, odor or discharge. Musculoskeletal: Patient reports joint pain.  Denies decrease in range of motion, difficulty with gait, muscle pain or joint swelling.  Skin: Denies redness, rashes, lesions or ulcercations.  Neurological: Denies dizziness, difficulty with memory, difficulty with speech or problems with balance and coordination.  Psych: Patient has a history of anxiety and depression.  Denies SI/HI.  No other specific complaints in a complete review of systems (except as listed in  HPI above).  Objective:   Physical Exam  There were no vitals taken for this visit.  Wt Readings from Last 3 Encounters:  10/04/23 158 lb (71.7 kg)  08/04/23 153 lb 6.4 oz (69.6 kg)  07/12/23 154 lb (69.9 kg)    General: Appears her stated age, overweight, in NAD. Skin: Warm, dry and intact.  HEENT: Head: normal shape and size; Eyes: sclera white, no icterus, conjunctiva pink, PERRLA and EOMs intact;  Neck:  Neck supple, trachea midline. No masses, lumps or thyromegaly present.  Cardiovascular: Normal rate and rhythm. S1,S2 noted.  No murmur, rubs or gallops noted. No JVD or BLE edema. No carotid bruits noted. Pulmonary/Chest: Normal effort and positive vesicular breath sounds. No respiratory distress. No wheezes, rales or ronchi noted.  Abdomen: Soft and nontender. Normal bowel sounds.  Musculoskeletal: Strength 5/5BUE/BLE. No difficulty with gait.  Neurological: Alert and oriented. Cranial nerves II-XII grossly intact. Coordination normal.  Psychiatric: Mood and affect normal. Behavior is normal. Judgment and thought content normal.    BMET    Component Value Date/Time   NA 141 07/12/2023 1350   NA 142 10/11/2012 2133   K 4.1 07/12/2023 1350   K 3.5 10/11/2012 2133   CL 104 07/12/2023 1350   CL 107 10/11/2012 2133   CO2 29 07/12/2023 1350   CO2 26 10/11/2012 2133   GLUCOSE 97 07/12/2023 1350   GLUCOSE 105 (H) 10/11/2012 2133    BUN 10 07/12/2023 1350   BUN 12 10/11/2012 2133   CREATININE 1.03 (H) 07/12/2023 1350   CALCIUM  9.4 07/12/2023 1350   CALCIUM  9.7 10/11/2012 2133   GFRNONAA >60 11/01/2019 0431   GFRNONAA 62 10/25/2019 1003   GFRAA >60 11/01/2019 0431   GFRAA 72 10/25/2019 1003    Lipid Panel     Component Value Date/Time   CHOL 193 07/12/2023 1350   TRIG 102 07/12/2023 1350   HDL 65 07/12/2023 1350   CHOLHDL 3.0 07/12/2023 1350   LDLCALC 108 (H) 07/12/2023 1350    CBC    Component Value Date/Time   WBC 7.3 07/12/2023 1350   RBC 4.80 07/12/2023 1350   HGB 14.1 07/12/2023 1350   HGB 14.9 10/11/2012 2133   HCT 41.8 07/12/2023 1350   HCT 41.7 10/11/2012 2133   PLT 260 07/12/2023 1350   PLT 249 10/11/2012 2133   MCV 87.1 07/12/2023 1350   MCV 87 10/11/2012 2133   MCH 29.4 07/12/2023 1350   MCHC 33.7 07/12/2023 1350   RDW 13.8 07/12/2023 1350   RDW 13.6 10/11/2012 2133   LYMPHSABS 1.4 10/29/2019 0548   LYMPHSABS 2.0 06/28/2011 1412   MONOABS 0.9 10/29/2019 0548   MONOABS 0.7 06/28/2011 1412   EOSABS 0.1 10/29/2019 0548   EOSABS 0.0 06/28/2011 1412   BASOSABS 0.0 10/29/2019 0548   BASOSABS 0.0 06/28/2011 1412    Hgb A1C Lab Results  Component Value Date   HGBA1C 5.7 (H) 07/12/2023            Assessment & Plan:   Preventative health maintenance:  Flu shot declined Tetanus UTD Encouraged her to get her COVID-vaccine She declines Pneumovax or Prevnar at this time Discussed Shingrix vaccine, she will check coverage with her insurance company and schedule visit if she would like to have this done She no longer wants to screen for cervical cancer She declines mammogram She declines bone density Cologuard ordered previously, she just needs to complete this Encouraged her to consume a balanced diet and exercise regimen Advised  her to see an eye doctor and dentist annually We will check CBC, c-Met, lipid, A1c today  RTC in 6 months, follow-up chronic conditions Angeline Laura, NP

## 2024-03-02 ENCOUNTER — Encounter: Admitting: Internal Medicine

## 2024-03-02 ENCOUNTER — Encounter: Payer: Self-pay | Admitting: Internal Medicine

## 2024-03-02 VITALS — BP 128/82 | Ht 63.0 in | Wt 158.4 lb

## 2024-03-02 DIAGNOSIS — E7800 Pure hypercholesterolemia, unspecified: Secondary | ICD-10-CM

## 2024-03-02 DIAGNOSIS — Z6828 Body mass index (BMI) 28.0-28.9, adult: Secondary | ICD-10-CM

## 2024-03-02 DIAGNOSIS — E663 Overweight: Secondary | ICD-10-CM

## 2024-03-02 DIAGNOSIS — R7303 Prediabetes: Secondary | ICD-10-CM

## 2024-03-02 DIAGNOSIS — Z0001 Encounter for general adult medical examination with abnormal findings: Secondary | ICD-10-CM

## 2024-03-02 NOTE — Assessment & Plan Note (Signed)
 Encouraged diet and exercise for weight loss ?

## 2024-03-02 NOTE — Telephone Encounter (Signed)
 Requested medication (s) are due for refill today - no  Requested medication (s) are on the active medication list -yes  Future visit scheduled -yes  Last refill: 03/01/24 #90  Notes to clinic: duplicate request, non delegated Rx  Requested Prescriptions  Pending Prescriptions Disp Refills   clonazePAM  (KLONOPIN ) 0.5 MG tablet [Pharmacy Med Name: CLONAZEPAM  0.5 MG TAB] 90 tablet     Sig: TAKE 1 TABLET BY MOUTH 3 TIMES DAILY     Not Delegated - Psychiatry: Anxiolytics/Hypnotics 2 Failed - 03/02/2024  9:53 AM      Failed - This refill cannot be delegated      Failed - Urine Drug Screen completed in last 360 days      Passed - Patient is not pregnant      Passed - Valid encounter within last 6 months    Recent Outpatient Visits           5 months ago Primary hypertension   [place] [facility] [place], Angeline ORN, NP   7 months ago Primary hypertension   [place] [facility] [place], Angeline ORN, NP   7 months ago Pure hypercholesterolemia   [place] [facility] [place], Angeline ORN, NP                 Requested Prescriptions  Pending Prescriptions Disp Refills   clonazePAM  (KLONOPIN ) 0.5 MG tablet [Pharmacy Med Name: CLONAZEPAM  0.5 MG TAB] 90 tablet     Sig: TAKE 1 TABLET BY MOUTH 3 TIMES DAILY     Not Delegated - Psychiatry: Anxiolytics/Hypnotics 2 Failed - 03/02/2024  9:53 AM      Failed - This refill cannot be delegated      Failed - Urine Drug Screen completed in last 360 days      Passed - Patient is not pregnant      Passed - Valid encounter within last 6 months    Recent Outpatient Visits           5 months ago Primary hypertension   [place] [facility] [place], Angeline ORN, NP   7 months ago Primary hypertension   [place] [facility] [place], Angeline ORN, NP   7 months ago Pure hypercholesterolemia   [place] [facility] [place], Angeline ORN, [place]

## 2024-03-02 NOTE — Patient Instructions (Signed)
 Health Maintenance for Postmenopausal Women Menopause is a normal process in which your ability to get pregnant comes to an end. This process happens slowly over many months or years, usually between the ages of 76 and 38. Menopause is complete when you have missed your menstrual period for 12 months. It is important to talk with your health care provider about some of the most common conditions that affect women after menopause (postmenopausal women). These include heart disease, cancer, and bone loss (osteoporosis). Adopting a healthy lifestyle and getting preventive care can help to promote your health and wellness. The actions you take can also lower your chances of developing some of these common conditions. What are the signs and symptoms of menopause? During menopause, you may have the following symptoms: Hot flashes. These can be moderate or severe. Night sweats. Decrease in sex drive. Mood swings. Headaches. Tiredness (fatigue). Irritability. Memory problems. Problems falling asleep or staying asleep. Talk with your health care provider about treatment options for your symptoms. Do I need hormone replacement therapy? Hormone replacement therapy is effective in treating symptoms that are caused by menopause, such as hot flashes and night sweats. Hormone replacement carries certain risks, especially as you become older. If you are thinking about using estrogen or estrogen with progestin, discuss the benefits and risks with your health care provider. How can I reduce my risk for heart disease and stroke? The risk of heart disease, heart attack, and stroke increases as you age. One of the causes may be a change in the body's hormones during menopause. This can affect how your body uses dietary fats, triglycerides, and cholesterol. Heart attack and stroke are medical emergencies. There are many things that you can do to help prevent heart disease and stroke. Watch your blood pressure High  blood pressure causes heart disease and increases the risk of stroke. This is more likely to develop in people who have high blood pressure readings or are overweight. Have your blood pressure checked: Every 3-5 years if you are 32-23 years of age. Every year if you are 31 years old or older. Eat a healthy diet  Eat a diet that includes plenty of vegetables, fruits, low-fat dairy products, and lean protein. Do not eat a lot of foods that are high in solid fats, added sugars, or sodium. Get regular exercise Get regular exercise. This is one of the most important things you can do for your health. Most adults should: Try to exercise for at least 150 minutes each week. The exercise should increase your heart rate and make you sweat (moderate-intensity exercise). Try to do strengthening exercises at least twice each week. Do these in addition to the moderate-intensity exercise. Spend less time sitting. Even light physical activity can be beneficial. Other tips Work with your health care provider to achieve or maintain a healthy weight. Do not use any products that contain nicotine or tobacco. These products include cigarettes, chewing tobacco, and vaping devices, such as e-cigarettes. If you need help quitting, ask your health care provider. Know your numbers. Ask your health care provider to check your cholesterol and your blood sugar (glucose). Continue to have your blood tested as directed by your health care provider. Do I need screening for cancer? Depending on your health history and family history, you may need to have cancer screenings at different stages of your life. This may include screening for: Breast cancer. Cervical cancer. Lung cancer. Colorectal cancer. What is my risk for osteoporosis? After menopause, you may be  at increased risk for osteoporosis. Osteoporosis is a condition in which bone destruction happens more quickly than new bone creation. To help prevent osteoporosis or  the bone fractures that can happen because of osteoporosis, you may take the following actions: If you are 24-54 years old, get at least 1,000 mg of calcium and at least 600 international units (IU) of vitamin D  per day. If you are older than age 75 but younger than age 30, get at least 1,200 mg of calcium and at least 600 international units (IU) of vitamin D  per day. If you are older than age 8, get at least 1,200 mg of calcium and at least 800 international units (IU) of vitamin D  per day. Smoking and drinking excessive alcohol increase the risk of osteoporosis. Eat foods that are rich in calcium and vitamin D , and do weight-bearing exercises several times each week as directed by your health care provider. How does menopause affect my mental health? Depression may occur at any age, but it is more common as you become older. Common symptoms of depression include: Feeling depressed. Changes in sleep patterns. Changes in appetite or eating patterns. Feeling an overall lack of motivation or enjoyment of activities that you previously enjoyed. Frequent crying spells. Talk with your health care provider if you think that you are experiencing any of these symptoms. General instructions See your health care provider for regular wellness exams and vaccines. This may include: Scheduling regular health, dental, and eye exams. Getting and maintaining your vaccines. These include: Influenza vaccine. Get this vaccine each year before the flu season begins. Pneumonia vaccine. Shingles vaccine. Tetanus, diphtheria, and pertussis (Tdap) booster vaccine. Your health care provider may also recommend other immunizations. Tell your health care provider if you have ever been abused or do not feel safe at home. Summary Menopause is a normal process in which your ability to get pregnant comes to an end. This condition causes hot flashes, night sweats, decreased interest in sex, mood swings, headaches, or lack  of sleep. Treatment for this condition may include hormone replacement therapy. Take actions to keep yourself healthy, including exercising regularly, eating a healthy diet, watching your weight, and checking your blood pressure and blood sugar levels. Get screened for cancer and depression. Make sure that you are up to date with all your vaccines. This information is not intended to replace advice given to you by your health care provider. Make sure you discuss any questions you have with your health care provider. Document Revised: 07/21/2020 Document Reviewed: 07/21/2020 Elsevier Patient Education  2024 [employer].

## 2024-03-03 LAB — COMPREHENSIVE METABOLIC PANEL WITH GFR
AG Ratio: 1.5 (calc) (ref 1.0–2.5)
ALT: 12 U/L (ref 6–29)
AST: 18 U/L (ref 10–35)
Albumin: 4.1 g/dL (ref 3.6–5.1)
Alkaline phosphatase (APISO): 129 U/L (ref 37–153)
BUN/Creatinine Ratio: 10 (calc) (ref 6–22)
BUN: 11 mg/dL (ref 7–25)
CO2: 27 mmol/L (ref 20–32)
Calcium: 9 mg/dL (ref 8.6–10.4)
Chloride: 103 mmol/L (ref 98–110)
Creat: 1.07 mg/dL — ABNORMAL HIGH (ref 0.60–1.00)
Globulin: 2.7 g/dL (ref 1.9–3.7)
Glucose, Bld: 104 mg/dL — ABNORMAL HIGH (ref 65–99)
Potassium: 4 mmol/L (ref 3.5–5.3)
Sodium: 140 mmol/L (ref 135–146)
Total Bilirubin: 1.1 mg/dL (ref 0.2–1.2)
Total Protein: 6.8 g/dL (ref 6.1–8.1)
eGFR: 54 mL/min/1.73m2 — ABNORMAL LOW

## 2024-03-03 LAB — CBC
HCT: 45 % (ref 35.9–46.0)
Hemoglobin: 14.8 g/dL (ref 11.7–15.5)
MCH: 29.4 pg (ref 27.0–33.0)
MCHC: 32.9 g/dL (ref 31.6–35.4)
MCV: 89.5 fL (ref 81.4–101.7)
MPV: 9.4 fL (ref 7.5–12.5)
Platelets: 306 Thousand/uL (ref 140–400)
RBC: 5.03 Million/uL (ref 3.80–5.10)
RDW: 13.3 % (ref 11.0–15.0)
WBC: 8.6 Thousand/uL (ref 3.8–10.8)

## 2024-03-03 LAB — LIPID PANEL
Cholesterol: 196 mg/dL
HDL: 62 mg/dL
LDL Cholesterol (Calc): 111 mg/dL — ABNORMAL HIGH
Non-HDL Cholesterol (Calc): 134 mg/dL — ABNORMAL HIGH
Total CHOL/HDL Ratio: 3.2 (calc)
Triglycerides: 121 mg/dL

## 2024-03-03 LAB — TSH: TSH: 3.11 m[IU]/L (ref 0.40–4.50)

## 2024-03-03 LAB — HEMOGLOBIN A1C
Hgb A1c MFr Bld: 5.7 % — ABNORMAL HIGH
Mean Plasma Glucose: 117 mg/dL
eAG (mmol/L): 6.5 mmol/L

## 2024-03-05 ENCOUNTER — Telehealth: Payer: Self-pay

## 2024-03-05 ENCOUNTER — Ambulatory Visit: Payer: Self-pay | Admitting: Internal Medicine

## 2024-03-05 MED ORDER — ATORVASTATIN CALCIUM 40 MG PO TABS: 40.0000 mg | ORAL_TABLET | Freq: Every day | ORAL | 3 refills | Status: AC

## 2024-03-05 NOTE — Telephone Encounter (Signed)
 Copied from CRM #[id number]. Topic: General - Other >> Mar 05, 2024  9:45 AM Olam RAMAN wrote: Reason for CRM: pt calling back for lab results. advised results listed agreed to increase atorvastatin  to 40 mg daily

## 2024-03-05 NOTE — Telephone Encounter (Signed)
 Rx has been sent to pharmacy

## 2024-03-13 ENCOUNTER — Other Ambulatory Visit: Payer: Self-pay | Admitting: Internal Medicine

## 2024-03-15 NOTE — Telephone Encounter (Signed)
 Requested Prescriptions  Pending Prescriptions Disp Refills   amLODipine  (NORVASC ) 2.5 MG tablet [Pharmacy Med Name: AMLODIPINE  BESYLATE 2.5 MG TAB] 90 tablet 1    Sig: TAKE 1 TABLET BY MOUTH DAILY     Cardiovascular: Calcium  Channel Blockers 2 Passed - 03/15/2024  8:28 AM      Passed - Last BP in normal range    BP Readings from Last 1 Encounters:  03/02/24 128/82         Passed - Last Heart Rate in normal range    Pulse Readings from Last 1 Encounters:  07/12/23 83         Passed - Valid encounter within last 6 months    Recent Outpatient Visits           1 week ago Encounter for general adult medical examination with abnormal findings   [place] [facility] [place], Angeline ORN, NP   5 months ago Primary hypertension   [place] [facility] [place], Angeline ORN, NP   7 months ago Primary hypertension   [place] [facility] [place], Angeline ORN, NP   8 months ago Pure hypercholesterolemia   [facility] [place], Angeline ORN, [place]

## 2024-03-26 ENCOUNTER — Other Ambulatory Visit: Payer: Self-pay | Admitting: Internal Medicine

## 2024-03-27 NOTE — Telephone Encounter (Signed)
 Requested medication (s) are due for refill today - yes  Requested medication (s) are on the active medication list -yes  Future visit scheduled -yes  Last refill: 03/01/24 #90  Notes to clinic: non delegated Rx  Requested Prescriptions  Pending Prescriptions Disp Refills   clonazePAM  (KLONOPIN ) 0.5 MG tablet [Pharmacy Med Name: CLONAZEPAM  0.5 MG TAB] 90 tablet     Sig: TAKE 1 TABLET BY MOUTH 3 TIMES DAILY     Not Delegated - Psychiatry: Anxiolytics/Hypnotics 2 Failed - 03/27/2024 12:33 PM      Failed - This refill cannot be delegated      Failed - Urine Drug Screen completed in last 360 days      Passed - Patient is not pregnant      Passed - Valid encounter within last 6 months    Recent Outpatient Visits           3 weeks ago Encounter for general adult medical examination with abnormal findings   [place] [facility] [place], Angeline ORN, NP   5 months ago Primary hypertension   [place] [facility] [place], Angeline ORN, NP   7 months ago Primary hypertension   [place] [facility] [place], Angeline ORN, NP   8 months ago Pure hypercholesterolemia   [place] [facility] [place], Angeline ORN, NP                 Requested Prescriptions  Pending Prescriptions Disp Refills   clonazePAM  (KLONOPIN ) 0.5 MG tablet [Pharmacy Med Name: CLONAZEPAM  0.5 MG TAB] 90 tablet     Sig: TAKE 1 TABLET BY MOUTH 3 TIMES DAILY     Not Delegated - Psychiatry: Anxiolytics/Hypnotics 2 Failed - 03/27/2024 12:33 PM      Failed - This refill cannot be delegated      Failed - Urine Drug Screen completed in last 360 days      Passed - Patient is not pregnant      Passed - Valid encounter within last 6 months    Recent Outpatient Visits           3 weeks ago Encounter for general adult medical examination with abnormal findings   [place] [facility] [place], Angeline ORN, NP   5 months ago Primary hypertension    [place] [facility] [place], Angeline ORN, NP   7 months ago Primary hypertension   [place] [facility] [place], Angeline ORN, NP   8 months ago Pure hypercholesterolemia   [place] [facility] [place], Angeline ORN, [place]

## 2024-04-06 ENCOUNTER — Other Ambulatory Visit: Payer: Self-pay | Admitting: Internal Medicine

## 2024-04-06 DIAGNOSIS — F419 Anxiety disorder, unspecified: Secondary | ICD-10-CM

## 2024-04-06 NOTE — Telephone Encounter (Signed)
 Requested Prescriptions  Pending Prescriptions Disp Refills   PARoxetine  (PAXIL ) 40 MG tablet [Pharmacy Med Name: PAROXETINE  HCL 40 MG TAB] 90 tablet 1    Sig: TAKE 1 TABLET BY MOUTH DAILY     Psychiatry:  Antidepressants - SSRI Passed - 04/06/2024  4:09 PM      Passed - Valid encounter within last 6 months    Recent Outpatient Visits           1 month ago Encounter for general adult medical examination with abnormal findings   [place] [facility] [place], Angeline ORN, NP   6 months ago Primary hypertension   [employer] [facility] [place], Angeline ORN, NP   8 months ago Primary hypertension   [place] [facility] [place], Angeline ORN, NP   8 months ago Pure hypercholesterolemia   [place] [facility] [place], Angeline ORN, NP               pantoprazole  (PROTONIX ) 40 MG tablet [Pharmacy Med Name: PANTOPRAZOLE  SODIUM 40 MG DR TAB] 180 tablet 1    Sig: TAKE 1 TABLET BY MOUTH TWICE A DAY     Gastroenterology: Proton Pump Inhibitors Passed - 04/06/2024  4:09 PM      Passed - Valid encounter within last 12 months    Recent Outpatient Visits           1 month ago Encounter for general adult medical examination with abnormal findings   [place] [facility] [place], Angeline ORN, NP   6 months ago Primary hypertension   [place] [facility] [place], Angeline ORN, NP   8 months ago Primary hypertension   [place] [facility] [place], Angeline ORN, NP   8 months ago Pure hypercholesterolemia   [place] [facility] [place], Angeline ORN, [place]

## 2024-07-20 ENCOUNTER — Ambulatory Visit

## 2024-07-25 ENCOUNTER — Ambulatory Visit

## 2024-08-31 ENCOUNTER — Ambulatory Visit: Admitting: Internal Medicine
